# Patient Record
Sex: Female | Born: 1945 | Hispanic: No | Smoking: Never smoker
Health system: Southern US, Community
[De-identification: ages and names within clinical notes are randomized; demographics above are authoritative.]

## PROBLEM LIST (undated history)

## (undated) DIAGNOSIS — T4145XA Adverse effect of unspecified anesthetic, initial encounter: Secondary | ICD-10-CM

## (undated) DIAGNOSIS — J40 Bronchitis, not specified as acute or chronic: Secondary | ICD-10-CM

## (undated) DIAGNOSIS — G709 Myoneural disorder, unspecified: Secondary | ICD-10-CM

## (undated) DIAGNOSIS — T8859XA Other complications of anesthesia, initial encounter: Secondary | ICD-10-CM

## (undated) DIAGNOSIS — K219 Gastro-esophageal reflux disease without esophagitis: Secondary | ICD-10-CM

## (undated) DIAGNOSIS — M1990 Unspecified osteoarthritis, unspecified site: Secondary | ICD-10-CM

## (undated) DIAGNOSIS — J189 Pneumonia, unspecified organism: Secondary | ICD-10-CM

## (undated) DIAGNOSIS — I639 Cerebral infarction, unspecified: Secondary | ICD-10-CM

## (undated) DIAGNOSIS — I10 Essential (primary) hypertension: Secondary | ICD-10-CM

## (undated) DIAGNOSIS — R079 Chest pain, unspecified: Secondary | ICD-10-CM

## (undated) DIAGNOSIS — Z9889 Other specified postprocedural states: Secondary | ICD-10-CM

## (undated) DIAGNOSIS — E669 Obesity, unspecified: Secondary | ICD-10-CM

## (undated) DIAGNOSIS — I7121 Aneurysm of the ascending aorta, without rupture: Secondary | ICD-10-CM

## (undated) DIAGNOSIS — I712 Thoracic aortic aneurysm, without rupture: Secondary | ICD-10-CM

## (undated) HISTORY — PX: TONSILLECTOMY: SUR1361

## (undated) HISTORY — PX: MENISCUS REPAIR: SHX5179

## (undated) HISTORY — DX: Aneurysm of the ascending aorta, without rupture: I71.21

## (undated) HISTORY — PX: TEMPOROMANDIBULAR JOINT SURGERY: SHX35

## (undated) HISTORY — PX: ROTATOR CUFF REPAIR: SHX139

## (undated) HISTORY — PX: WISDOM TOOTH EXTRACTION: SHX21

## (undated) HISTORY — DX: Other specified postprocedural states: Z98.890

## (undated) HISTORY — DX: Obesity, unspecified: E66.9

## (undated) HISTORY — PX: CHOLECYSTECTOMY: SHX55

## (undated) HISTORY — DX: Thoracic aortic aneurysm, without rupture: I71.2

---

## 2002-11-20 HISTORY — PX: CARDIAC CATHETERIZATION: SHX172

## 2004-11-20 HISTORY — PX: ABDOMINAL HYSTERECTOMY: SHX81

## 2005-03-15 ENCOUNTER — Other Ambulatory Visit: Admission: RE | Admit: 2005-03-15 | Discharge: 2005-03-15 | Payer: Self-pay | Admitting: Obstetrics and Gynecology

## 2005-04-05 ENCOUNTER — Encounter: Admission: RE | Admit: 2005-04-05 | Discharge: 2005-04-05 | Payer: Self-pay | Admitting: Obstetrics and Gynecology

## 2005-08-25 ENCOUNTER — Emergency Department (HOSPITAL_COMMUNITY): Admission: EM | Admit: 2005-08-25 | Discharge: 2005-08-25 | Payer: Self-pay | Admitting: Emergency Medicine

## 2006-08-02 ENCOUNTER — Ambulatory Visit (HOSPITAL_COMMUNITY): Admission: RE | Admit: 2006-08-02 | Discharge: 2006-08-02 | Payer: Self-pay | Admitting: Geriatric Medicine

## 2007-07-24 ENCOUNTER — Emergency Department (HOSPITAL_COMMUNITY): Admission: EM | Admit: 2007-07-24 | Discharge: 2007-07-24 | Payer: Self-pay | Admitting: *Deleted

## 2007-08-20 ENCOUNTER — Other Ambulatory Visit: Admission: RE | Admit: 2007-08-20 | Discharge: 2007-08-20 | Payer: Self-pay | Admitting: Gynecology

## 2007-09-27 ENCOUNTER — Emergency Department (HOSPITAL_COMMUNITY): Admission: EM | Admit: 2007-09-27 | Discharge: 2007-09-28 | Payer: Self-pay | Admitting: Emergency Medicine

## 2007-09-30 ENCOUNTER — Emergency Department (HOSPITAL_COMMUNITY): Admission: EM | Admit: 2007-09-30 | Discharge: 2007-09-30 | Payer: Self-pay | Admitting: Emergency Medicine

## 2007-10-09 ENCOUNTER — Encounter: Admission: RE | Admit: 2007-10-09 | Discharge: 2007-10-09 | Payer: Self-pay | Admitting: Gastroenterology

## 2007-11-21 DIAGNOSIS — R079 Chest pain, unspecified: Secondary | ICD-10-CM

## 2007-11-21 HISTORY — DX: Chest pain, unspecified: R07.9

## 2008-01-06 ENCOUNTER — Ambulatory Visit: Payer: Self-pay | Admitting: Cardiovascular Disease

## 2008-01-06 ENCOUNTER — Encounter (INDEPENDENT_AMBULATORY_CARE_PROVIDER_SITE_OTHER): Payer: Self-pay | Admitting: Internal Medicine

## 2008-01-06 ENCOUNTER — Inpatient Hospital Stay (HOSPITAL_COMMUNITY): Admission: EM | Admit: 2008-01-06 | Discharge: 2008-01-08 | Payer: Self-pay | Admitting: Emergency Medicine

## 2008-01-06 ENCOUNTER — Ambulatory Visit: Payer: Self-pay | Admitting: Internal Medicine

## 2008-04-07 ENCOUNTER — Encounter: Admission: RE | Admit: 2008-04-07 | Discharge: 2008-04-07 | Payer: Self-pay | Admitting: Gastroenterology

## 2008-09-19 ENCOUNTER — Emergency Department (HOSPITAL_COMMUNITY): Admission: EM | Admit: 2008-09-19 | Discharge: 2008-09-19 | Payer: Self-pay | Admitting: Emergency Medicine

## 2008-10-09 ENCOUNTER — Emergency Department (HOSPITAL_COMMUNITY): Admission: EM | Admit: 2008-10-09 | Discharge: 2008-10-09 | Payer: Self-pay | Admitting: Emergency Medicine

## 2008-10-09 ENCOUNTER — Ambulatory Visit: Payer: Self-pay | Admitting: Surgery

## 2009-04-28 ENCOUNTER — Encounter: Admission: RE | Admit: 2009-04-28 | Discharge: 2009-04-28 | Payer: Self-pay | Admitting: Gastroenterology

## 2009-10-08 ENCOUNTER — Emergency Department (HOSPITAL_COMMUNITY): Admission: EM | Admit: 2009-10-08 | Discharge: 2009-10-09 | Payer: Self-pay | Admitting: Emergency Medicine

## 2010-08-05 ENCOUNTER — Emergency Department (HOSPITAL_COMMUNITY): Admission: EM | Admit: 2010-08-05 | Discharge: 2010-08-05 | Payer: Self-pay | Admitting: Family Medicine

## 2010-11-16 ENCOUNTER — Emergency Department (HOSPITAL_COMMUNITY)
Admission: EM | Admit: 2010-11-16 | Discharge: 2010-11-16 | Payer: Self-pay | Source: Home / Self Care | Admitting: Family Medicine

## 2010-12-12 ENCOUNTER — Encounter: Payer: Self-pay | Admitting: Gastroenterology

## 2010-12-15 ENCOUNTER — Encounter
Admission: RE | Admit: 2010-12-15 | Discharge: 2010-12-15 | Payer: Self-pay | Source: Home / Self Care | Attending: Geriatric Medicine | Admitting: Geriatric Medicine

## 2011-02-22 LAB — BASIC METABOLIC PANEL
CO2: 30 mEq/L (ref 19–32)
GFR calc Af Amer: 56 mL/min — ABNORMAL LOW (ref >60)
Potassium: 3.1 mEq/L — ABNORMAL LOW (ref 3.5–5.1)
Sodium: 138 mEq/L (ref 135–145)

## 2011-02-22 LAB — URINALYSIS, ROUTINE W REFLEX MICROSCOPIC
Bilirubin Urine: NEGATIVE
Glucose, UA: NEGATIVE mg/dL
Hgb urine dipstick: NEGATIVE
Specific Gravity, Urine: 1.012 (ref 1.005–1.030)
pH: 5 (ref 5.0–8.0)

## 2011-02-22 LAB — CBC
MCV: 93.7 fL (ref 78.0–100.0)
Platelets: 258 10*3/uL (ref 150–400)
WBC: 7.5 10*3/uL (ref 4.0–10.5)

## 2011-02-22 LAB — URINE CULTURE
Colony Count: NO GROWTH
Culture: NO GROWTH

## 2011-02-22 LAB — DIFFERENTIAL
Eosinophils Absolute: 0.2 10*3/uL (ref 0.0–0.7)
Lymphs Abs: 2.3 10*3/uL (ref 0.7–4.0)
Neutrophils Relative %: 61 % (ref 43–77)

## 2011-02-22 LAB — HEMOCCULT GUIAC POC 1CARD (OFFICE): Fecal Occult Bld: NEGATIVE

## 2011-02-22 LAB — ABO/RH: ABO/RH(D): B POS

## 2011-02-22 LAB — TYPE AND SCREEN: Antibody Screen: NEGATIVE

## 2011-04-04 NOTE — Consult Note (Signed)
NAME:  [name] [name], [name] [name]               ACCOUNT NO.:  [id number]   MEDICAL RECORD NO.:  [id number]          PATIENT TYPE:  INP   LOCATION:  1843                         FACILITY:  [facility]   PHYSICIAN:  Veverly Fells. Excell Seltzer, MD  DATE OF BIRTH:  Jul 17, 1946   DATE OF CONSULTATION:  01/06/2008  DATE OF DISCHARGE:                                 CONSULTATION   REASON FOR CONSULTATION:  Chest pain.   HISTORY OF PRESENT ILLNESS:  Ms. Vanburen is a 65 year old woman with no  prior cardiac history, who has presented with 4 days of left sided chest  pain that has been waxing and waning. She had sudden onset of pain when  playing with her child and it lasted for 15 minutes. Her pain has been  present almost at all times over the last 4 days but it has waxed and  waned in intensity. She describes the pain as sharp and it radiates to  the left arm. She has had mild shortness of breath and a feeling of  nausea when the pain occurred. She has had no light headedness or  syncope. She denies palpitations. She has had similar episodes in the  past with intermittent chest pain and also had a cardiac catheterization  in 2004 that was normal by her report. She has no other complaints at  present.   PAST MEDICAL HISTORY:  1. Essential hypertension.  2. Type 2 diabetes.  3. Gastroesophageal reflux disease.  4. Chronic back pain.   MEDICATIONS:  1. Aspirin 81 mg daily.  2. Atenolol 25 mg daily.  3. Hydrochlorothiazide 25 mg daily.  4. Metformin 250 mg b.i.d.  5. Actos 30 mg daily.  6. Flexeril 10 mg at bedtime.  7. Nitroglycerin p.r.n.  8. Protonix 40 mg daily.   ALLERGIES:  CODEINE.   SOCIAL HISTORY:  The patient is married. She lives locally with her  husband. She does participate in regular exercise. She does not smoke  cigarettes or drink alcohol. She uses no illicit drugs.   FAMILY HISTORY:  Her mother died at age 92 of pancreatitis. Her father  died of mitral valve disease at age 30. She has a  brother who has had  myocardial infarction and also has had bypass surgery and a sister who  has a pacemaker.   REVIEW OF SYSTEMS:  Complete 12 point review of systems was performed.  Pertinent positives included exertional dyspnea, paroxysmal nocturnal  dyspnea, nausea, gastroesophageal reflux, and abdominal pain. All other  systems reviewed and are negative except as detailed above.   PHYSICAL EXAMINATION:  GENERAL: Alert and oriented. No acute distress.  VITAL SIGNS:  Temperature 97, heart rate 58, respiratory rate 18, blood  pressure 140/85. O2 saturation 97% on room air.  HEENT:  Normal.  NECK:  Normal carotid upstrokes without bruits. Jugular venous pressure  is normal. No thyromegaly or thyroid nodules.  LUNGS:  Clear to auscultation bilaterally.  HEART:  The apex is discrete and non-displaced.  HEART:  Regular rate and rhythm. Without murmur, rub, or gallop.  ABDOMEN:  Soft, nontender. No organomegaly and no bruits.  BACK:  No CVA tenderness.  EXTREMITIES:  No clubbing, cyanosis, or edema. Peripheral pulses 2+ and  equal throughout.  SKIN:  Warm and dry without rash.  MUSCULOSKELETAL:  No joint deformities.  NEUROLOGIC:  Cranial nerves 2-12 are intact. Strength 5/5 and equal in  the arms and legs bilaterally.  LYMPH:  No adenopathy.   LABORATORY DATA:  Chest x-ray shows cardiac enlargement, otherwise  normal.   EKG shows normal sinus rhythm with non-specific T wave abnormality,  localized to the anteroseptal leads, left axis deviation, otherwise  within normal limits.   Troponin is less than 0.05. CK MB is normal. Creatinine 0.8, potassium  3.3. Hemoglobin 13.4. Platelet count 305,000. White blood cell count  4.9.   ASSESSMENT/PLAN:  1. Atypical chest pain. Initial EKG is non-diagnostic and cardiac      enzymes are normal. I think it is unlikely that this is cardiac      pain, based on the patient's history, previous normal coronaries by      report, and the absence  of objective findings of ischemia. Would      recommend continuing with rule out MI protocol with serial EKG's      and cardiac enzymes. She has already undergone a 2-D echo and      results of that are pending. If her echo shows no wall motion      abnormaliites and her cardiac enzymes are normal, I think it would      be reasonable to proceed with an outpatient Myoview study. If she      has persistent or progressive symptoms, we may need to proceed with      further inpatient evaluation but I think this is most likely a non-      cardiac episode of chest pain. It would be worthwhile to obtain the      report from her previous catheterization, which was performed in      [place], [place].   For remaining medical problems including hypertension and diabetes, her  medical therapy will be continued.      Veverly Fells. Excell Seltzer, MD  Electronically Signed     MDC/MEDQ  D:  01/06/2008  T:  01/06/2008  Job:  [id number]

## 2011-04-04 NOTE — Discharge Summary (Signed)
NAME:  [name] [name], [name] [name]               ACCOUNT NO.:  [id number]   MEDICAL RECORD NO.:  [id number]          PATIENT TYPE:  INP   LOCATION:  3703                         FACILITY:  [facility]   PHYSICIAN:  [name] [name], M.D.DATE OF BIRTH:  Apr 05, 1946   DATE OF ADMISSION:  01/06/2008  DATE OF DISCHARGE:  01/08/2008                               DISCHARGE SUMMARY   PRIMARY CARE PHYSICIAN:  [name] [name], M.D., [name] [name], [place]   CONSULTATIONS:  Cardiology.   PROCEDURE:  Echocardiogram and stress Myoview.   REASON FOR ADMISSION:  A 65 year old female with a history of diabetes,  gastroesophageal reflux disease, hypertension, and strong family history  for coronary artery disease, who presents with a three-day history of  chest pain.   PERTINENT ADMISSION LABS:  CBC which showed white blood cell count 4.9,  hemoglobin 13.4, hematocrit 39.5, platelets 305.  Electrolytes were  within normal limits with a sodium of 141, potassium 3.3, chloride 104,  bicarb 29, BUN 15, creatinine 0.77, and glucose 132.  Point of care  cardiac enzymes were negative.   Admission studies included an EKG which showed inverted T waves in V1  through V3, flattened T waves in other leads with poor R wave  progression particularly anterior and septal.   DISCHARGE DIAGNOSES:  1. Angina.  2. Type 2 diabetes.  3. Hypertension.  4. Gastroesophageal reflux disease.   DISCHARGE MEDICATIONS:  1. IMDUR 30 mg p.o. daily.  2. Hydrochlorothiazide 25 mg p.o. daily.  3. Metformin 250 mg p.o. b.i.d.  4. Actos 30 mg p.o. daily.  5. Flexeril 10 mg p.o. q.h.s.  6. Aspirin 81 mg p.o. daily.  7. Lisinopril 5 mg p.o. daily.   MEDICATIONS STOPPED DURING ADMISSION:  Atenolol.   HOSPITAL COURSE:  Problem 1.  Chest pain.  Cardiology was consulted and  based on the patient's symptoms and EKG changes, they recommended an  echocardiogram which showed overall left ventricular systolic function  normal with an ejection fraction of 60%,  no regional wall motion  abnormalities, mild mitral valve replacement, a right ventricular size  that was the upper limits of normal, and mildly reduced right  ventricular function.  Following that a stress Myoview was completed  which was within normal limits.  Additionally, the patient was risk  stratified with a fasting lipid panel which showed a total cholesterol  143, triglycerides 78, HDL 39, LDL 88, VLDL 16, total cholesterol to HDL  ratio 3.7.  TSH was also measured which was within normal limits at  0.650.  It was decided by cardiology that catheterization was not  necessary.  However, if the patient has future chest pain, we would  definitely recommend reconsidering referring her for catheterization as  she has a multitude of risk factors particularly with a strong family  history with relatives dying in their 18s from myocardial infarction.  Additional risk factors include her diabetes, her hypertension, and her  age.  The patient was started on IMDUR 30 mg daily for her chest pain.  Also to further reduce risk factors, at some point in the future it  might be appropriate  to start her on a statin or Fish Oil.  We will  leave this up to the primary care [name] [name].   Problem 2.  Hypertension.  The patient's blood pressure was well  controlled in the hospital.  We did change her hypertensive medications.  We discontinued her atenolol and started her on a low dose of lisinopril  5 mg daily.  We thought this would be beneficial since she has diabetes  and this would be good renal protection.   Problem 3.  Diabetes.  During her hospitalization, we put her on a  sliding scale insulin and held her Metformin because we did not know  whether or not she would get a catheterization.  We did discharge her on  Metformin.  We continued her Actos.  We checked the hemoglobin A1C which  was found to be 6.3.  Her blood sugars were between 99 and 173 during  this hospitalization.   Problem 4.   Gastroesophageal reflux disease.  The patient was continued  on her Prilosec.   CONDITION ON DISCHARGE:  Stable.   PENDING TESTS AT DISCHARGE:  None.   DISPOSITION:  The patient was discharged home.   FOLLOWUP:  The patient is to see her primary care [name] [name], Dr. Florentina [name], and an appointment is to be set up by his office staff on  January 13, 2008.  She has been dismissed from cardiology and needs no  further appointments per their notes.   FOLLOW-UP ISSUES:  Titration of blood pressure medicines as appropriate  since we changed her from atenolol to lisinopril, this may need to be  titrated.  The patient was instructed to check her blood pressure daily  in the morning and to hold her lisinopril if her systolic blood pressure  was 110 or less.   The patient will probably need a basic metabolic panel some time in the  near future to monitor both her sodium and potassium on her ACE  inhibitor.   We discharged her on aspirin 81 mg, however, given her risk factors, she  may be an appropriate candidate for 325 mg.  We recommend strongly  considering bumping up her aspirin dose to 325 mg.   The patient may benefit from a statin or Fish Oil to further decrease  her risk for heart disease.      [name] Muir, MD  Electronically Signed      [name] [name]. Mayford [name], M.D.  Electronically Signed    SO/MEDQ  D:  01/08/2008  T:  01/09/2008  Job:  [id number]   cc:   [name] [name], M.D., [place], [place]

## 2011-04-04 NOTE — H&P (Signed)
NAME:  [name] [name], [name] [name]               ACCOUNT NO.:  [id number]   MEDICAL RECORD NO.:  [id number]          PATIENT TYPE:  INP   LOCATION:  3703                         FACILITY:  [facility]   PHYSICIAN:  Raynelle Fanning A. Mayford Knife, M.D.DATE OF BIRTH:  09-14-1946   DATE OF ADMISSION:  01/06/2008  DATE OF DISCHARGE:                              HISTORY & PHYSICAL   CHIEF COMPLAINT:  Chest pain.   HISTORY OF PRESENT ILLNESS:  This is a 65 year old female with a history  of diabetes, GERD, and hypertension who presents to ED for a three-day  history of atypical chest pain.  She reports that she was laying on the  couch resting when the chest pressure started mid substernally and  radiated down her left arm.  She did report associated nausea and  diaphoresis, but no vomiting or shortness of breath.  The pressure/pain  resolved over 15 minutes.  Pain is worsened with exertion.  Pain  returned every 20 minutes to hour intermittently since Friday.  This  morning she decided to come to the hospital as the pain was persistent.  She has not received nitroglycerin.  Prevacid did not alleviate the  pain.  She has had chest pain in the past and had a catheterization in  [place] in [place] which was negative.  She does have a strong family history of  CAD and MI.   PAST MEDICAL HISTORY:  Diabetes type 2, GERD, hypertension, CAD.   MEDICATIONS:  1. Metformin 250 mg p.o. b.i.d.  2. Aspirin 81 mg daily.  3. Atenolol 25 mg daily.  4. Actos 3 mg daily.  5. Flexeril 10 mg daily.  6. Prevacid 30 mg daily.  7. Hydrochlorothiazide 25 mg daily.   FAMILY HISTORY:  Mother died at 16 of pancreatitis.  Father died at 40  of MI status post CABG.  Brother is living with MI in his 25's status  post CABG.   ALLERGIES:  CODEINE causes nausea.   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  She lives with her husband 60 year old son.  She has four  grandchildren.  She denies alcohol, tobacco, or  illicit drug use.   LABORATORY DATA:  Creatinine 0.77, hemoglobin 13.4.  Point of care  enzymes negative.  EKG shows flipped T waves in V1 through V3, T waves,  poor R wave progression.   REVIEW OF SYSTEMS:  See HPI.  GENERAL:  No fevers.  RESPIRATORY:  No  shortness of breath.  ABDOMEN:  No abdominal pain.   PHYSICAL EXAMINATION:  VITAL SIGNS:  Temperature 97, blood pressure  157/100, pulse 72, respiratory rate 20, and oxygen saturation 100% on  room air.  GENERAL:  She is alert, pleasant, and in no acute distress.  LUNGS:  Clear to auscultation bilaterally, no increased work of  breathing. Tender to palpation of left epigastrium.  CARDIOVASCULAR:  Regular rate and rhythm with no murmurs, rubs, or  gallops.  ABDOMEN:  Soft and nontender with positive bowel sounds.  EXTREMITIES:  No edema.   ASSESSMENT:  This is a 65 year old female  with:  1. Chest pain, typical angina versus costochondritis versus      gastroesophageal reflux disease.  EKG shows nonspecific T wave      changes.  She has a very strong family history of coronary artery      disease/myocardial infarction.  Her other risk factors include her      female sex, diabetes, and hypertension.  We will admit to rule out      myocardial infarction.  We will also risk stratify with fasting      lipid panel, hemoglobin A1C.  We will check TSH and get two-      dimensional echocardiogram to assess level of prior ischemia.  We      will call cardiology for further evaluation is she is at high risk      for CAD.  2. Hypertension.  Continue home medicines.  We may need to change home      regimen.  3. Diabetes.  Hold Metformin because we may go to catheterization.      Continue Actos and start sliding scale insulin.  4. Gastroesophageal reflux disease.  Continue Prilosec.  5. FEN, GI, NPO for now.  6. Prophylaxis.  Lovenox.   DISPOSITION:  Pending #1, we will likely need complete cardiac workup as  an outpatient.       Ruthe Mannan, M.D.  Electronically Signed      Zenaida Deed. Mayford Knife, M.D.  Electronically Signed    TA/MEDQ  D:  01/06/2008  T:  01/07/2008  Job:  [id number]

## 2011-07-07 ENCOUNTER — Emergency Department (HOSPITAL_COMMUNITY)
Admission: EM | Admit: 2011-07-07 | Discharge: 2011-07-08 | Disposition: A | Discharge status: Home / Self Care | Payer: Medicare Other | Attending: Emergency Medicine | Admitting: Emergency Medicine

## 2011-07-07 DIAGNOSIS — E119 Type 2 diabetes mellitus without complications: Secondary | ICD-10-CM | POA: Insufficient documentation

## 2011-07-07 DIAGNOSIS — G319 Degenerative disease of nervous system, unspecified: Secondary | ICD-10-CM | POA: Insufficient documentation

## 2011-07-07 DIAGNOSIS — R51 Headache: Secondary | ICD-10-CM | POA: Insufficient documentation

## 2011-07-07 DIAGNOSIS — K219 Gastro-esophageal reflux disease without esophagitis: Secondary | ICD-10-CM | POA: Insufficient documentation

## 2011-07-07 DIAGNOSIS — H538 Other visual disturbances: Secondary | ICD-10-CM | POA: Insufficient documentation

## 2011-07-07 DIAGNOSIS — I10 Essential (primary) hypertension: Secondary | ICD-10-CM | POA: Insufficient documentation

## 2011-07-07 DIAGNOSIS — R42 Dizziness and giddiness: Secondary | ICD-10-CM | POA: Insufficient documentation

## 2011-07-07 DIAGNOSIS — R209 Unspecified disturbances of skin sensation: Secondary | ICD-10-CM | POA: Insufficient documentation

## 2011-07-08 ENCOUNTER — Emergency Department (HOSPITAL_COMMUNITY): Payer: Medicare Other

## 2011-07-08 LAB — BASIC METABOLIC PANEL
Calcium: 9.8 mg/dL (ref 8.4–10.5)
Creatinine, Ser: 0.88 mg/dL (ref 0.50–1.10)
GFR calc Af Amer: >60 mL/min (ref >60)

## 2011-07-08 LAB — URINALYSIS, ROUTINE W REFLEX MICROSCOPIC
Glucose, UA: NEGATIVE mg/dL
Specific Gravity, Urine: 1.012 (ref 1.005–1.030)
pH: 5 (ref 5.0–8.0)

## 2011-07-08 LAB — CBC
Hemoglobin: 13.4 g/dL (ref 12.0–15.0)
MCH: 30.3 pg (ref 26.0–34.0)
RBC: 4.42 MIL/uL (ref 3.87–5.11)

## 2011-07-08 LAB — SEDIMENTATION RATE: Sed Rate: 17 mm/hr (ref 0–22)

## 2011-07-08 LAB — DIFFERENTIAL
Basophils Absolute: 0 10*3/uL (ref 0.0–0.1)
Basophils Relative: 1 % (ref 0–1)
Monocytes Relative: 6 % (ref 3–12)
Neutro Abs: 4.6 10*3/uL (ref 1.7–7.7)
Neutrophils Relative %: 55 % (ref 43–77)

## 2011-07-08 LAB — URINE MICROSCOPIC-ADD ON

## 2011-07-09 ENCOUNTER — Inpatient Hospital Stay (INDEPENDENT_AMBULATORY_CARE_PROVIDER_SITE_OTHER)
Admission: RE | Admit: 2011-07-09 | Discharge: 2011-07-09 | Disposition: A | Discharge status: Home / Self Care | Payer: Medicare Other | Source: Ambulatory Visit | Attending: Emergency Medicine | Admitting: Emergency Medicine

## 2011-07-09 DIAGNOSIS — J019 Acute sinusitis, unspecified: Secondary | ICD-10-CM

## 2011-07-09 DIAGNOSIS — G43009 Migraine without aura, not intractable, without status migrainosus: Secondary | ICD-10-CM

## 2011-07-11 ENCOUNTER — Emergency Department (HOSPITAL_COMMUNITY)
Admission: EM | Admit: 2011-07-11 | Discharge: 2011-07-11 | Disposition: A | Discharge status: Home / Self Care | Payer: Medicare Other | Attending: Emergency Medicine | Admitting: Emergency Medicine

## 2011-07-11 ENCOUNTER — Emergency Department (HOSPITAL_COMMUNITY): Payer: Medicare Other

## 2011-07-11 DIAGNOSIS — K219 Gastro-esophageal reflux disease without esophagitis: Secondary | ICD-10-CM | POA: Insufficient documentation

## 2011-07-11 DIAGNOSIS — E119 Type 2 diabetes mellitus without complications: Secondary | ICD-10-CM | POA: Insufficient documentation

## 2011-07-11 DIAGNOSIS — R110 Nausea: Secondary | ICD-10-CM | POA: Insufficient documentation

## 2011-07-11 DIAGNOSIS — H53149 Visual discomfort, unspecified: Secondary | ICD-10-CM | POA: Insufficient documentation

## 2011-07-11 DIAGNOSIS — G43909 Migraine, unspecified, not intractable, without status migrainosus: Secondary | ICD-10-CM | POA: Insufficient documentation

## 2011-07-11 DIAGNOSIS — J329 Chronic sinusitis, unspecified: Secondary | ICD-10-CM | POA: Insufficient documentation

## 2011-07-11 DIAGNOSIS — R509 Fever, unspecified: Secondary | ICD-10-CM | POA: Insufficient documentation

## 2011-07-11 DIAGNOSIS — I10 Essential (primary) hypertension: Secondary | ICD-10-CM | POA: Insufficient documentation

## 2011-07-11 LAB — POCT I-STAT, CHEM 8
BUN: 13 mg/dL (ref 6–23)
Calcium, Ion: 1.14 mmol/L (ref 1.12–1.32)
Glucose, Bld: 146 mg/dL — ABNORMAL HIGH (ref 70–99)
HCT: 44 % (ref 36.0–46.0)
TCO2: 27 mmol/L (ref 0–100)

## 2011-08-11 LAB — CBC
HCT: 37.3
Hemoglobin: 12.6
MCHC: 33.8
MCV: 94
Platelets: 272
Platelets: 305
RBC: 3.97
RBC: 4.25
WBC: 6.2

## 2011-08-11 LAB — CARDIAC PANEL(CRET KIN+CKTOT+MB+TROPI)
Relative Index: INVALID
Relative Index: INVALID
Total CK: 49
Troponin I: 0.01
Troponin I: <0.01

## 2011-08-11 LAB — COMPREHENSIVE METABOLIC PANEL
ALT: 11
AST: 14
CO2: 26
Calcium: 9
Chloride: 107
Creatinine, Ser: 0.84
GFR calc Af Amer: >60
GFR calc non Af Amer: >60
Glucose, Bld: 111 — ABNORMAL HIGH
Total Bilirubin: 0.6

## 2011-08-11 LAB — DIFFERENTIAL
Basophils Absolute: 0
Basophils Relative: 1
Eosinophils Absolute: 0.1
Eosinophils Relative: 3
Lymphocytes Relative: 45
Lymphs Abs: 2.8
Monocytes Absolute: 0.5
Monocytes Relative: 6
Monocytes Relative: 8
Neutro Abs: 2.3
Neutrophils Relative %: 48

## 2011-08-11 LAB — BASIC METABOLIC PANEL
BUN: 15
CO2: 29
Calcium: 9.3
Chloride: 104
Creatinine, Ser: 0.77
GFR calc Af Amer: >60
GFR calc Af Amer: >60
GFR calc non Af Amer: >60
Potassium: 3.3 — ABNORMAL LOW
Sodium: 138

## 2011-08-11 LAB — HEPATIC FUNCTION PANEL
ALT: 13
AST: 14
Albumin: 3.3 — ABNORMAL LOW
Alkaline Phosphatase: 87
Bilirubin, Direct: <0.1
Total Bilirubin: 0.6

## 2011-08-11 LAB — POCT CARDIAC MARKERS
CKMB, poc: <1 — ABNORMAL LOW
Myoglobin, poc: 43.2
Operator id: 198171

## 2011-08-11 LAB — CK TOTAL AND CKMB (NOT AT ARMC): Total CK: 54

## 2011-08-11 LAB — LIPID PANEL
Total CHOL/HDL Ratio: 3.7
VLDL: 16

## 2011-08-21 LAB — POCT I-STAT, CHEM 8
HCT: 42
Hemoglobin: 14.3
Potassium: 3.2 — ABNORMAL LOW
Sodium: 140

## 2011-08-21 LAB — DIFFERENTIAL
Basophils Relative: 1
Eosinophils Absolute: 0.1
Lymphs Abs: 1.9
Neutrophils Relative %: 50

## 2011-08-21 LAB — URINALYSIS, ROUTINE W REFLEX MICROSCOPIC
Glucose, UA: NEGATIVE
Hgb urine dipstick: NEGATIVE
Ketones, ur: NEGATIVE
pH: 5

## 2011-08-21 LAB — CBC
MCHC: 32.6
MCV: 93.4
Platelets: 274
WBC: 4.8

## 2011-08-21 LAB — GLUCOSE, CAPILLARY: Glucose-Capillary: 124 — ABNORMAL HIGH

## 2011-08-21 LAB — URINE MICROSCOPIC-ADD ON

## 2011-08-22 LAB — BASIC METABOLIC PANEL
GFR calc non Af Amer: >60
Potassium: 3.2 — ABNORMAL LOW
Sodium: 138

## 2011-08-22 LAB — DIFFERENTIAL
Eosinophils Relative: 2
Lymphocytes Relative: 40
Lymphs Abs: 2.1
Monocytes Absolute: 0.4

## 2011-08-22 LAB — CBC
HCT: 41
Hemoglobin: 13.4
Platelets: 258
RBC: 4.33
WBC: 5.2

## 2011-08-22 LAB — D-DIMER, QUANTITATIVE: D-Dimer, Quant: 0.66 — ABNORMAL HIGH

## 2011-08-29 LAB — I-STAT 8, (EC8 V) (CONVERTED LAB)
Acid-Base Excess: 1
Chloride: 106
Hemoglobin: 15
Potassium: 3.6
Sodium: 142
TCO2: 28
pH, Ven: 7.381 — ABNORMAL HIGH

## 2011-08-29 LAB — URINE MICROSCOPIC-ADD ON

## 2011-08-29 LAB — COMPREHENSIVE METABOLIC PANEL
Albumin: 3.3 — ABNORMAL LOW
BUN: 9
CO2: 26
Chloride: 106
Creatinine, Ser: 0.69
GFR calc non Af Amer: >60
Glucose, Bld: 124 — ABNORMAL HIGH
Total Bilirubin: 0.6

## 2011-08-29 LAB — URINALYSIS, ROUTINE W REFLEX MICROSCOPIC
Glucose, UA: NEGATIVE
Nitrite: NEGATIVE
Protein, ur: NEGATIVE
Protein, ur: NEGATIVE
Urobilinogen, UA: 1

## 2011-08-29 LAB — DIFFERENTIAL
Basophils Absolute: 0
Eosinophils Absolute: 0.2
Eosinophils Relative: 3
Lymphs Abs: 1.8
Monocytes Absolute: 0.3

## 2011-08-29 LAB — LIPASE, BLOOD: Lipase: 21

## 2011-08-29 LAB — URINE CULTURE

## 2011-08-29 LAB — CBC
HCT: 39.3
Hemoglobin: 13.1
MCV: 90.8
Platelets: 313
RDW: 15.1

## 2011-08-29 LAB — POCT I-STAT CREATININE: Operator id: 198171

## 2011-09-01 LAB — I-STAT 8, (EC8 V) (CONVERTED LAB)
Acid-Base Excess: 5 — ABNORMAL HIGH
Bicarbonate: 31.4 — ABNORMAL HIGH
HCT: 46
Hemoglobin: 15.6 — ABNORMAL HIGH
Operator id: 277751
Sodium: 140
TCO2: 33
pCO2, Ven: 49.8

## 2011-09-01 LAB — URINALYSIS, ROUTINE W REFLEX MICROSCOPIC
Glucose, UA: NEGATIVE
Hgb urine dipstick: NEGATIVE
Protein, ur: NEGATIVE
Specific Gravity, Urine: 1.03
Urobilinogen, UA: 1

## 2011-09-01 LAB — CBC
HCT: 40.6
MCV: 91.7
Platelets: 329
RDW: 15.5 — ABNORMAL HIGH
WBC: 6.4

## 2011-09-01 LAB — DIFFERENTIAL
Basophils Absolute: 0
Basophils Relative: 0
Eosinophils Absolute: 0.1
Eosinophils Relative: 2
Neutrophils Relative %: 58

## 2011-09-01 LAB — URINE MICROSCOPIC-ADD ON

## 2011-09-01 LAB — POCT I-STAT CREATININE: Creatinine, Ser: 0.9

## 2011-12-11 ENCOUNTER — Other Ambulatory Visit: Payer: Self-pay | Admitting: Geriatric Medicine

## 2011-12-11 DIAGNOSIS — Z1231 Encounter for screening mammogram for malignant neoplasm of breast: Secondary | ICD-10-CM

## 2012-01-02 ENCOUNTER — Ambulatory Visit
Admission: RE | Admit: 2012-01-02 | Discharge: 2012-01-02 | Disposition: A | Discharge status: Home / Self Care | Payer: Medicare Other | Source: Ambulatory Visit | Attending: Geriatric Medicine | Admitting: Geriatric Medicine

## 2012-01-02 DIAGNOSIS — Z1231 Encounter for screening mammogram for malignant neoplasm of breast: Secondary | ICD-10-CM

## 2012-01-09 ENCOUNTER — Other Ambulatory Visit: Payer: Self-pay | Admitting: Geriatric Medicine

## 2012-01-09 DIAGNOSIS — R928 Other abnormal and inconclusive findings on diagnostic imaging of breast: Secondary | ICD-10-CM

## 2012-01-19 ENCOUNTER — Other Ambulatory Visit: Payer: Medicare Other

## 2012-02-12 ENCOUNTER — Ambulatory Visit
Admission: RE | Admit: 2012-02-12 | Discharge: 2012-02-12 | Disposition: A | Discharge status: Home / Self Care | Payer: Medicare Other | Source: Ambulatory Visit | Attending: Geriatric Medicine | Admitting: Geriatric Medicine

## 2012-02-12 DIAGNOSIS — R928 Other abnormal and inconclusive findings on diagnostic imaging of breast: Secondary | ICD-10-CM

## 2012-03-02 ENCOUNTER — Emergency Department (HOSPITAL_COMMUNITY)
Admission: EM | Admit: 2012-03-02 | Discharge: 2012-03-02 | Disposition: A | Discharge status: Home / Self Care | Payer: Medicare Other | Source: Home / Self Care | Attending: Emergency Medicine | Admitting: Emergency Medicine

## 2012-03-02 ENCOUNTER — Encounter (HOSPITAL_COMMUNITY): Payer: Self-pay | Admitting: Emergency Medicine

## 2012-03-02 DIAGNOSIS — M65839 Other synovitis and tenosynovitis, unspecified forearm: Secondary | ICD-10-CM

## 2012-03-02 DIAGNOSIS — M659 Synovitis and tenosynovitis, unspecified: Secondary | ICD-10-CM

## 2012-03-02 HISTORY — DX: Essential (primary) hypertension: I10

## 2012-03-02 MED ORDER — MELOXICAM 7.5 MG PO TABS: 7.5000 mg | ORAL_TABLET | Freq: Every day | ORAL | Status: DC

## 2012-03-02 MED ORDER — TRAMADOL HCL 50 MG PO TABS: 100.0000 mg | ORAL_TABLET | Freq: Three times a day (TID) | ORAL | Status: AC | PRN

## 2012-03-02 NOTE — Discharge Instructions (Signed)
Tendinitis and Tenosynovitis  Tendinitis is inflammation of the tendon. Tenosynovitis is inflammation of the lining around the tendon (tendon sheath). These painful conditions often occur at once. Tendons attach muscle to bone. To move a limb, force from the muscle moves through the tendon, to the bone. These conditions often cause increased pain when moving. Tendinitis may be caused by a small or partial tear in the tendon.  SYMPTOMS   Pain, tenderness, redness, bruising, or swelling at the injury.   Loss of normal joint movement.   Pain that gets worse with use of the muscle and joint attached to the tendon.   Weakness in the tendon, caused by calcium build up that may occur with tendinitis.   Commonly affected tendons:   Achilles tendon (calf of leg).   Rotator cuff (shoulder joint).   Patellar tendon (kneecap to shin).   Peroneal tendon (ankle).   Posterior tibial tendon (inner ankle).   Biceps tendon (in front of shoulder).  CAUSES   Sudden strain on a flexed muscle, muscle overuse, sudden increase or change in activity, vigorous activity.   Result of a direct hit (less common).   Poor muscle action (biomechanics).  RISK INCREASES WITH:  Injury (trauma).   Too much exercise.   Sudden change in athletic activity.   Incorrect exercise form or technique.   Poor strength and flexibility.   Not warming-up properly before activity.   Returning to activity before healing is complete.  PREVENTION   Warm-up and stretch properly before activity.   Maintain physical fitness:   Joint flexibility.   Muscle strength and endurance.   Fitness that increases heart rate.   Learn and use proper exercise techniques.   Use rehabilitation exercises to strengthen weak muscles and tendons.   Ice the tendon after activity, to reduce recurring inflammation.   Wear proper fitting protective equipment for specific tendons, when indicated.  PROGNOSIS  When treated properly,  can be cured in 6 to 8 weeks. Recovery may take longer, depending on degree of injury.  RELATED COMPLICATIONS   Re-injury or recurring symptoms.   Permanent weakness or joint stiffness, if injury is severe and recovery is not completed.   Delayed healing, if sports are started before healing is complete.   Tearing apart (rupture) of the inflamed tendon. Tendinitis means the tendon is injured and must recover.  TREATMENT  Treatment first involves ice, medicine, and rest from aggravating activities. This reduces pain and inflammation. Modifying your activity may be considered to prevent recurring injury. A brace, elastic bandage wrap, splint, cast, or sling may be prescribed to protect the joint for a short period. After that period, strengthening and stretching exercise may help to regain strength and full range of motion. If the condition persists, despite non-surgical treatment, surgery may be recommended to remove the inflamed tendon lining. Corticosteroid injections may be given to reduce inflammation. However, these injections may weaken the tendon and increase your risk for tendon rupture. MEDICATION   If pain medicine is needed, nonsteroidal anti-inflammatory medicines (aspirin and ibuprofen), or other minor pain relievers (acetaminophen), are often recommended.   Do not take pain medicine for 7 days before surgery.   Prescription pain relievers are usually prescribed only after surgery. Use only as directed and only as much as you need.   Ointments applied to the skin may be helpful.   Corticosteroid injections may be given to reduce inflammation. However, this may increase your risk of a tendon rupture.  HEAT AND COLD  Cold   treatment (icing) relieves pain and reduces inflammation. Cold treatment should be applied for 10 to 15 minutes every 2 to 3 hours, and immediately after activity that aggravates your symptoms. Use ice packs or an ice massage.   Heat treatment may be used before  performing stretching and strengthening activities prescribed by your caregiver, physical therapist, or athletic trainer. Use a heat pack or a warm water soak.  SEEK MEDICAL CARE IF:   Symptoms get worse or do not improve, despite treatment.   Pain becomes too much to tolerate.   You develop numbness or tingling.   Toes become cold, or toenails become blue, gray, or dark colored.   New, unexplained symptoms develop. (Drugs used in treatment may produce side effects.)  Document Released: 11/06/2005 Document Revised: 10/26/2011 Document Reviewed: 02/18/2009 [employer] Patient Information 2012 [employer]. 

## 2012-03-02 NOTE — ED Provider Notes (Addendum)
Chief Complaint  Patient presents with  . Wrist Pain    History of Present Illness:   The patient is a 66 year old female who has pain over the radial aspect of the right wrist for the past 3 days. 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 The wrist hurts worse if she flexes it and particularly if she on her flexes it. There is no swelling, no numbness, tingling, or weakness.  Review of Systems:  Other than noted above, the patient denies any of the following symptoms: Systemic:  No fevers, chills, sweats, or aches.  No fatigue or tiredness. Musculoskeletal:  No joint pain, arthritis, bursitis, swelling, back pain, or neck pain. Neurological:  No muscular weakness, paresthesias, headache, or trouble with speech or coordination.  No dizziness.   PMFSH:  Past medical history, family history, social history, meds, and allergies were reviewed.  Physical Exam:   Vital signs:  BP 150/87  Pulse 84  Temp(Src) 98.7 F (37.1 C) (Oral)  Resp 20  SpO2 100% Gen:  Alert and oriented times 3.  In no distress. Musculoskeletal: There was pain to palpation over the extensor tendon as it progresses over the radial styloid. There was no swelling. Finkelstein's test was positive. The wrist had full range of motion and there was no pain in the anatomical snuff box area. Otherwise, all joints had a full a ROM with no swelling, bruising or deformity.  No edema, pulses full. Extremities were warm and pink.  Capillary refill was brisk.  Skin:  Clear, warm and dry.  No rash. Neuro:  Alert and oriented times 3.  Muscle strength was normal.  Sensation was intact to light touch.   Course in Urgent Care Center:   She was placed in a wrist splint with thumb spica.  Assessment:  The encounter diagnosis was Tenosynovitis, wrist. She has DeQuervain's synovitis. I offered an injection, but she opted for  the wrist splint with thumb spica instead.  Plan:   1.  The following meds were prescribed:   New Prescriptions   MELOXICAM (MOBIC) 7.5 MG TABLET    Take 1 tablet (7.5 mg total) by mouth daily.   TRAMADOL (ULTRAM) 50 MG TABLET    Take 2 tablets (100 mg total) by mouth every 8 (eight) hours as needed for pain.   2.  The patient was instructed in symptomatic care, including rest and activity, elevation, application of ice and compression.  Appropriate handouts were given. 3.  The patient was told to return if becoming worse in any way, if no better in 3 or 4 days, and given some red flag symptoms that would indicate earlier return.   4.  The patient was told to follow up  Here in 2 weeks if no improvement.   Reuben Likes, MD 03/02/12 1958  Reuben Likes, MD 03/02/12 [phone]

## 2012-03-02 NOTE — ED Notes (Signed)
C/o right wrist pain, no specific injury.  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  Pain in radial aspect of wrist

## 2012-03-19 ENCOUNTER — Encounter (HOSPITAL_COMMUNITY): Payer: Self-pay | Admitting: *Deleted

## 2012-03-19 ENCOUNTER — Inpatient Hospital Stay (HOSPITAL_COMMUNITY)
Admission: AD | Admit: 2012-03-19 | Discharge: 2012-03-19 | Disposition: A | Discharge status: Home / Self Care | Payer: Medicare Other | Source: Ambulatory Visit | Attending: Obstetrics & Gynecology | Admitting: Obstetrics & Gynecology

## 2012-03-19 DIAGNOSIS — R109 Unspecified abdominal pain: Secondary | ICD-10-CM | POA: Insufficient documentation

## 2012-03-19 DIAGNOSIS — N8110 Cystocele, unspecified: Secondary | ICD-10-CM

## 2012-03-19 DIAGNOSIS — N816 Rectocele: Secondary | ICD-10-CM

## 2012-03-19 DIAGNOSIS — Z90710 Acquired absence of both cervix and uterus: Secondary | ICD-10-CM | POA: Insufficient documentation

## 2012-03-19 DIAGNOSIS — N8189 Other female genital prolapse: Secondary | ICD-10-CM | POA: Insufficient documentation

## 2012-03-19 LAB — URINALYSIS, ROUTINE W REFLEX MICROSCOPIC
Hgb urine dipstick: NEGATIVE
Specific Gravity, Urine: 1.02 (ref 1.005–1.030)
Urobilinogen, UA: 0.2 mg/dL (ref 0.0–1.0)
pH: 6 (ref 5.0–8.0)

## 2012-03-19 NOTE — MAU Note (Signed)
Pt reports she doesn't know if it is her bladder or her uterus but something is protruding out x 1 week. States at night it seems to get better but if she is up and moving around it protrudes outward. States some burning with urination. Denies fever. S/p partial hysterectomy in 1982

## 2012-03-19 NOTE — MAU Provider Note (Signed)
History     CSN: [id number]  Arrival date and time: 03/19/12 1816   First Provider Initiated Contact with Patient 03/19/12 2030      Chief Complaint  Patient presents with  . Abdominal Pain   HPIPt is not pregnant and complains of something bulging out of her vagina.  Pt denies constipation or vaginal discharge.  Pt has not been sexually active in 3 years.  She had an abdominal hysterectomy in 2006 in [place], [place].  She has had 4 vaginal deliveries with "large" babies.  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    Past Medical History  Diagnosis Date  . Hypertension   . Diabetes mellitus     Past Surgical History  Procedure Date  . Abdominal hysterectomy   . Back surgery   . Wisdom tooth extraction   . Cholecystectomy   . Temporomandibular joint surgery     Family History  Problem Relation Age of Onset  . Heart disease Father   . Diabetes Maternal Grandmother     History  Substance Use Topics  . Smoking status: Never Smoker   . Smokeless tobacco: Not on file  . Alcohol Use: No    Allergies: No Known Allergies  Prescriptions prior to admission  Medication Sig Dispense Refill  . aspirin 81 MG tablet Take 81 mg by mouth daily.      Marland Kitchen atenolol (TENORMIN) 25 MG tablet Take 25 mg by mouth daily.      Marland Kitchen glipiZIDE (GLUCOTROL) 5 MG tablet Take 5 mg by mouth 2 (two) times daily before a meal.      . hydrochlorothiazide (HYDRODIURIL) 50 MG tablet Take 50 mg by mouth daily.      . Lansoprazole (PREVACID PO) Take by mouth.      . meloxicam (MOBIC) 7.5 MG tablet Take 1 tablet (7.5 mg total) by mouth daily.  15 tablet  0    ROS Physical Exam   Blood pressure 146/78, pulse 64, temperature 98.4 F (36.9 C), temperature source Oral, resp. rate 18, height 5\' 3"  (1.6 m), weight 199 lb (90.266 kg), SpO2 100.00%.  Physical Exam  Nursing note and vitals reviewed. Constitutional: She is oriented to person, place, and time.  She appears well-developed and well-nourished.  HENT:  Head: Normocephalic.  Eyes: Pupils are equal, round, and reactive to light.  Neck: Normal range of motion. Neck supple.  Cardiovascular: Normal rate.   Respiratory: Effort normal.  GI: Soft. She exhibits no distension. There is no tenderness. There is no rebound and no guarding.  Genitourinary:       cystocele with bladder bulging to introitus. Rectocele also noted with R-V exam  Musculoskeletal: Normal range of motion.  Neurological: She is alert and oriented to person, place, and time.  Skin: Skin is warm and dry.  Psychiatric: She has a normal mood and affect.    MAU Course  Procedures Exam revealing pelvic floor relaxation with 3rd degree cystocele and rectocele Results for orders placed during the hospital encounter of 03/19/12 (from the past 24 hour(s))  URINALYSIS, ROUTINE W REFLEX MICROSCOPIC     Status: Normal   Collection Time   03/19/12  7:20 PM      Component Value Range   Color, Urine YELLOW  YELLOW    APPearance CLEAR  CLEAR    Specific Gravity, Urine 1.020  1.005 - 1.030    pH 6.0  5.0 - 8.0  Glucose, UA NEGATIVE  NEGATIVE (mg/dL)   Hgb urine dipstick NEGATIVE  NEGATIVE    Bilirubin Urine NEGATIVE  NEGATIVE    Ketones, ur NEGATIVE  NEGATIVE (mg/dL)   Protein, ur NEGATIVE  NEGATIVE (mg/dL)   Urobilinogen, UA 0.2  0.0 - 1.0 (mg/dL)   Nitrite NEGATIVE  NEGATIVE    Leukocytes, UA NEGATIVE  NEGATIVE    Assessment and Plan  Pelvic floor relaxation Appointment with Dr. Marice Potter at GYN clinic- clinic to call pt with appointment Kegels and double voiding instructed Up to Date info given on Cystocele and Lindi Adie 03/19/2012, 8:44 PM

## 2012-03-22 ENCOUNTER — Ambulatory Visit (INDEPENDENT_AMBULATORY_CARE_PROVIDER_SITE_OTHER): Payer: Medicare Other | Admitting: Obstetrics & Gynecology

## 2012-03-22 ENCOUNTER — Encounter: Payer: Self-pay | Admitting: Obstetrics & Gynecology

## 2012-03-22 VITALS — BP 137/84 | HR 58 | Temp 97.5°F | Ht 63.0 in | Wt 198.0 lb

## 2012-03-22 DIAGNOSIS — IMO0002 Reserved for concepts with insufficient information to code with codable children: Secondary | ICD-10-CM

## 2012-03-22 DIAGNOSIS — I10 Essential (primary) hypertension: Secondary | ICD-10-CM

## 2012-03-22 DIAGNOSIS — E119 Type 2 diabetes mellitus without complications: Secondary | ICD-10-CM | POA: Insufficient documentation

## 2012-03-22 DIAGNOSIS — K219 Gastro-esophageal reflux disease without esophagitis: Secondary | ICD-10-CM

## 2012-03-22 DIAGNOSIS — N8111 Cystocele, midline: Secondary | ICD-10-CM

## 2012-03-22 MED ORDER — ESTROGENS, CONJUGATED 0.625 MG/GM VA CREA: TOPICAL_CREAM | Freq: Every day | VAGINAL | Status: DC

## 2012-03-22 NOTE — Patient Instructions (Signed)
[name] [name],  Thank you for coming in to see [place] today. You have a cystocele (bulging of the wall between the bladder and vagina into the vagina):  Please do the following 1. Start premarin cream every MWF 2. F/u in 1 mos for pessary placement  Drs. Colton Engdahl and [employer]

## 2012-03-22 NOTE — Progress Notes (Signed)
Patient ID: [name] [name], female   DOB: [birth year], 66 y.o.   MRN: [id number] Subjective:    [name] [name] is a 66 y.o. female who presents for evaluation of a cystocele and possible rectocele. Problem started 2 weeks ago. Symptoms include: prolapse of tissue with straining.  She denies urinary incontinence. Symptoms have stabilized.  Menstrual History: OB History    Grav Para Term Preterm Abortions TAB SAB Ect Mult Living   4 4        4      Obstetric Comments   Delivered 4 large babies vaginally      No LMP recorded. Patient is postmenopausal.    Review of Systems Pertinent items are noted in HPI.   Objective:     BP 137/84  Pulse 58  Temp 97.5 F (36.4 C)  Ht 5\' 3"  (1.6 m)  Wt 198 lb (89.812 kg)  BMI 35.07 kg/m2 Pelvis:  External genitalia: normal general appearance Urinary system: urethral meatus normal Vaginal: cystocele present, with straining.  Adnexa: normal bimanual exam Uterus: non-palpable Rectal: good sphincter tone, no masses and no rectocele     Assessment:    The patient has a cystocele   Plan:    Discussed cystoceles and management options with the patient. Agricultural engineer distributed. Discussed pessary and will plan visit for fitting. Initiate hormone replacement therapy per orders.

## 2012-03-22 NOTE — Assessment & Plan Note (Signed)
A: The patient has a cystocele  P: Discussed cystoceles and management options with the patient. Agricultural engineer distributed. Discussed pessary and will plan visit for fitting. Initiate hormone replacement therapy per orders

## 2012-04-25 ENCOUNTER — Ambulatory Visit (INDEPENDENT_AMBULATORY_CARE_PROVIDER_SITE_OTHER): Payer: Medicare Other | Admitting: Obstetrics & Gynecology

## 2012-04-25 ENCOUNTER — Encounter: Payer: Self-pay | Admitting: Obstetrics & Gynecology

## 2012-04-25 VITALS — BP 119/76 | HR 57 | Temp 99.0°F | Ht 63.0 in | Wt 198.9 lb

## 2012-04-25 DIAGNOSIS — IMO0002 Reserved for concepts with insufficient information to code with codable children: Secondary | ICD-10-CM

## 2012-04-25 DIAGNOSIS — E6609 Other obesity due to excess calories: Secondary | ICD-10-CM | POA: Insufficient documentation

## 2012-04-25 DIAGNOSIS — N8111 Cystocele, midline: Secondary | ICD-10-CM

## 2012-04-25 NOTE — Progress Notes (Signed)
  Subjective:    Patient ID: [name] [name], female    DOB: [birth year], 66 y.o.   MRN: [id number]  HPI  [name] [name] is here for a pessary fitting. She has been using her vaginal estrogen 3 times per week.  Review of Systems     Objective:   Physical Exam  Good estrogen effect on vulva/vagina A #5 ring pessary was placed and it resolved her symptoms. She was able to remove and replace it. I have advised her to use her estrogen 2 times per week.      Assessment & Plan:  RTC 2 months for pessary check.

## 2012-04-25 NOTE — Progress Notes (Signed)
Here today for pessary. C/o pelvic pressure, especiaLLY when lifting something or when bending over. Per fall screen, denies a fall in the last 6 months.

## 2012-05-05 ENCOUNTER — Encounter (HOSPITAL_COMMUNITY): Payer: Self-pay | Admitting: Emergency Medicine

## 2012-05-05 ENCOUNTER — Emergency Department (HOSPITAL_COMMUNITY): Payer: Medicare Other

## 2012-05-05 ENCOUNTER — Inpatient Hospital Stay (HOSPITAL_COMMUNITY)
Admission: EM | Admit: 2012-05-05 | Discharge: 2012-05-07 | DRG: 948 | Disposition: A | Discharge status: Home / Self Care | Payer: Medicare Other | Source: Ambulatory Visit | Attending: Internal Medicine | Admitting: Internal Medicine

## 2012-05-05 ENCOUNTER — Inpatient Hospital Stay (HOSPITAL_COMMUNITY): Payer: Medicare Other

## 2012-05-05 DIAGNOSIS — K219 Gastro-esophageal reflux disease without esophagitis: Secondary | ICD-10-CM | POA: Diagnosis present

## 2012-05-05 DIAGNOSIS — R5381 Other malaise: Principal | ICD-10-CM | POA: Diagnosis present

## 2012-05-05 DIAGNOSIS — E1165 Type 2 diabetes mellitus with hyperglycemia: Secondary | ICD-10-CM

## 2012-05-05 DIAGNOSIS — I10 Essential (primary) hypertension: Secondary | ICD-10-CM | POA: Diagnosis present

## 2012-05-05 DIAGNOSIS — Z833 Family history of diabetes mellitus: Secondary | ICD-10-CM

## 2012-05-05 DIAGNOSIS — E876 Hypokalemia: Secondary | ICD-10-CM | POA: Diagnosis present

## 2012-05-05 DIAGNOSIS — R079 Chest pain, unspecified: Secondary | ICD-10-CM

## 2012-05-05 DIAGNOSIS — E6609 Other obesity due to excess calories: Secondary | ICD-10-CM | POA: Diagnosis present

## 2012-05-05 DIAGNOSIS — G459 Transient cerebral ischemic attack, unspecified: Secondary | ICD-10-CM

## 2012-05-05 DIAGNOSIS — R531 Weakness: Secondary | ICD-10-CM

## 2012-05-05 DIAGNOSIS — E119 Type 2 diabetes mellitus without complications: Secondary | ICD-10-CM | POA: Diagnosis present

## 2012-05-05 DIAGNOSIS — E669 Obesity, unspecified: Secondary | ICD-10-CM | POA: Diagnosis present

## 2012-05-05 DIAGNOSIS — I639 Cerebral infarction, unspecified: Secondary | ICD-10-CM | POA: Insufficient documentation

## 2012-05-05 DIAGNOSIS — Z6834 Body mass index (BMI) 34.0-34.9, adult: Secondary | ICD-10-CM

## 2012-05-05 DIAGNOSIS — R209 Unspecified disturbances of skin sensation: Secondary | ICD-10-CM | POA: Diagnosis present

## 2012-05-05 DIAGNOSIS — IMO0002 Reserved for concepts with insufficient information to code with codable children: Secondary | ICD-10-CM

## 2012-05-05 DIAGNOSIS — IMO0001 Reserved for inherently not codable concepts without codable children: Secondary | ICD-10-CM | POA: Diagnosis present

## 2012-05-05 DIAGNOSIS — Z8673 Personal history of transient ischemic attack (TIA), and cerebral infarction without residual deficits: Secondary | ICD-10-CM

## 2012-05-05 DIAGNOSIS — R001 Bradycardia, unspecified: Secondary | ICD-10-CM

## 2012-05-05 DIAGNOSIS — R51 Headache: Secondary | ICD-10-CM

## 2012-05-05 DIAGNOSIS — Z87891 Personal history of nicotine dependence: Secondary | ICD-10-CM

## 2012-05-05 HISTORY — DX: Cerebral infarction, unspecified: I63.9

## 2012-05-05 HISTORY — DX: Gastro-esophageal reflux disease without esophagitis: K21.9

## 2012-05-05 HISTORY — DX: Chest pain, unspecified: R07.9

## 2012-05-05 LAB — CK TOTAL AND CKMB (NOT AT ARMC)
CK, MB: 1.6 ng/mL (ref 0.3–4.0)
Relative Index: 1.4 (ref 0.0–2.5)
Total CK: 116 U/L (ref 7–177)

## 2012-05-05 LAB — COMPREHENSIVE METABOLIC PANEL
Albumin: 3.3 g/dL — ABNORMAL LOW (ref 3.5–5.2)
BUN: 18 mg/dL (ref 6–23)
Calcium: 9.5 mg/dL (ref 8.4–10.5)
Creatinine, Ser: 0.92 mg/dL (ref 0.50–1.10)
GFR calc Af Amer: 74 mL/min — ABNORMAL LOW (ref >90)
Glucose, Bld: 172 mg/dL — ABNORMAL HIGH (ref 70–99)
Total Protein: 7 g/dL (ref 6.0–8.3)

## 2012-05-05 LAB — URINALYSIS, ROUTINE W REFLEX MICROSCOPIC
Bilirubin Urine: NEGATIVE
Ketones, ur: NEGATIVE mg/dL
Nitrite: NEGATIVE
Protein, ur: NEGATIVE mg/dL
Urobilinogen, UA: 0.2 mg/dL (ref 0.0–1.0)
pH: 7.5 (ref 5.0–8.0)

## 2012-05-05 LAB — CBC
Hemoglobin: 13.8 g/dL (ref 12.0–15.0)
MCH: 30.1 pg (ref 26.0–34.0)
MCHC: 33.6 g/dL (ref 30.0–36.0)
MCV: 89.5 fL (ref 78.0–100.0)
Platelets: 281 10*3/uL (ref 150–400)
RBC: 4.59 MIL/uL (ref 3.87–5.11)

## 2012-05-05 LAB — DIFFERENTIAL
Basophils Relative: 0 % (ref 0–1)
Eosinophils Absolute: 0.2 10*3/uL (ref 0.0–0.7)
Eosinophils Relative: 4 % (ref 0–5)
Lymphs Abs: 2 10*3/uL (ref 0.7–4.0)
Monocytes Relative: 7 % (ref 3–12)
Neutrophils Relative %: 44 % (ref 43–77)

## 2012-05-05 LAB — GLUCOSE, CAPILLARY
Glucose-Capillary: 134 mg/dL — ABNORMAL HIGH (ref 70–99)
Glucose-Capillary: 141 mg/dL — ABNORMAL HIGH (ref 70–99)

## 2012-05-05 LAB — CARDIAC PANEL(CRET KIN+CKTOT+MB+TROPI)
CK, MB: 2 ng/mL (ref 0.3–4.0)
Relative Index: INVALID (ref 0.0–2.5)
Total CK: 93 U/L (ref 7–177)
Total CK: 86 U/L (ref 7–177)
Troponin I: <0.3 ng/mL (ref <0.30)

## 2012-05-05 LAB — PROTIME-INR
INR: 1.04 (ref 0.00–1.49)
Prothrombin Time: 13.8 seconds (ref 11.6–15.2)

## 2012-05-05 LAB — TROPONIN I: Troponin I: <0.3 ng/mL (ref <0.30)

## 2012-05-05 MED ORDER — SODIUM CHLORIDE 0.9 % IV SOLN
INTRAVENOUS | Status: AC
  Administered 2012-05-05 (×2): via INTRAVENOUS

## 2012-05-05 MED ORDER — GLIPIZIDE 5 MG PO TABS
5.0000 mg | ORAL_TABLET | Freq: Every day | ORAL | Status: DC
  Administered 2012-05-05 – 2012-05-07 (×3): 5 mg via ORAL
  Filled 2012-05-05 (×3): qty 1

## 2012-05-05 MED ORDER — ENOXAPARIN SODIUM 40 MG/0.4ML ~~LOC~~ SOLN
40.0000 mg | SUBCUTANEOUS | Status: DC
  Administered 2012-05-05 – 2012-05-07 (×3): 40 mg via SUBCUTANEOUS
  Filled 2012-05-05 (×3): qty 0.4

## 2012-05-05 MED ORDER — PANTOPRAZOLE SODIUM 40 MG PO TBEC
40.0000 mg | DELAYED_RELEASE_TABLET | Freq: Every day | ORAL | Status: DC
  Administered 2012-05-05 – 2012-05-07 (×3): 40 mg via ORAL
  Filled 2012-05-05 (×3): qty 1

## 2012-05-05 MED ORDER — ESTROGENS, CONJUGATED 0.625 MG/GM VA CREA
1.0000 g | TOPICAL_CREAM | VAGINAL | Status: DC
  Administered 2012-05-06: 0.25 via VAGINAL
  Filled 2012-05-05: qty 42.5

## 2012-05-05 MED ORDER — SENNOSIDES-DOCUSATE SODIUM 8.6-50 MG PO TABS: 1.0000 | ORAL_TABLET | Freq: Every evening | ORAL | Status: DC | PRN

## 2012-05-05 MED ORDER — CLOPIDOGREL BISULFATE 75 MG PO TABS
75.0000 mg | ORAL_TABLET | Freq: Every day | ORAL | Status: DC
  Administered 2012-05-06 – 2012-05-07 (×2): 75 mg via ORAL
  Filled 2012-05-05 (×4): qty 1

## 2012-05-05 MED ORDER — VITAMIN D3 25 MCG (1000 UNIT) PO TABS
1000.0000 [IU] | ORAL_TABLET | Freq: Every day | ORAL | Status: DC
  Administered 2012-05-05 – 2012-05-07 (×3): 1000 [IU] via ORAL
  Filled 2012-05-05 (×3): qty 1

## 2012-05-05 MED ORDER — HYDRALAZINE HCL 20 MG/ML IJ SOLN
10.0000 mg | Freq: Four times a day (QID) | INTRAMUSCULAR | Status: DC | PRN
  Filled 2012-05-05: qty 0.5

## 2012-05-05 MED ORDER — INSULIN ASPART 100 UNIT/ML ~~LOC~~ SOLN
0.0000 [IU] | Freq: Three times a day (TID) | SUBCUTANEOUS | Status: DC
  Administered 2012-05-06 (×2): 1 [IU] via SUBCUTANEOUS
  Administered 2012-05-07: 2 [IU] via SUBCUTANEOUS

## 2012-05-05 MED ORDER — ACETAMINOPHEN 325 MG PO TABS
650.0000 mg | ORAL_TABLET | ORAL | Status: DC | PRN
  Administered 2012-05-05 – 2012-05-07 (×2): 650 mg via ORAL
  Filled 2012-05-05: qty 2

## 2012-05-05 MED ORDER — IBUPROFEN 400 MG PO TABS
400.0000 mg | ORAL_TABLET | Freq: Four times a day (QID) | ORAL | Status: DC | PRN
  Filled 2012-05-05: qty 1

## 2012-05-05 MED ORDER — POTASSIUM CHLORIDE CRYS ER 20 MEQ PO TBCR
20.0000 meq | EXTENDED_RELEASE_TABLET | Freq: Once | ORAL | Status: AC
  Administered 2012-05-05: 20 meq via ORAL
  Filled 2012-05-05: qty 1

## 2012-05-05 MED ORDER — ACETAMINOPHEN 650 MG RE SUPP: 650.0000 mg | RECTAL | Status: DC | PRN

## 2012-05-05 MED ORDER — BUTALBITAL-APAP-CAFFEINE 50-325-40 MG PO TABS
1.0000 | ORAL_TABLET | Freq: Four times a day (QID) | ORAL | Status: DC | PRN
  Administered 2012-05-05 – 2012-05-06 (×4): 1 via ORAL
  Filled 2012-05-05 (×4): qty 1

## 2012-05-05 MED ORDER — ONDANSETRON HCL 4 MG/2ML IJ SOLN
4.0000 mg | Freq: Four times a day (QID) | INTRAMUSCULAR | Status: DC | PRN
  Administered 2012-05-05: 4 mg via INTRAVENOUS
  Filled 2012-05-05: qty 2

## 2012-05-05 MED ORDER — ATENOLOL 12.5 MG HALF TABLET
12.5000 mg | ORAL_TABLET | Freq: Every day | ORAL | Status: DC
  Administered 2012-05-05 – 2012-05-07 (×3): 12.5 mg via ORAL
  Filled 2012-05-05 (×3): qty 1

## 2012-05-05 NOTE — ED Notes (Signed)
Admitting physician at the bedside.

## 2012-05-05 NOTE — ED Notes (Addendum)
Per EMS:  Pt reports Left arm tingling and weakness since today at 0530 - EMS reports pt seemed anxious and was able to ambulate to the stretcher - with normal gait  Pt received 4 mg of zofran PTA

## 2012-05-05 NOTE — ED Provider Notes (Signed)
History     CSN: [id number]  Arrival date & time 05/05/12  4098   First MD Initiated Contact with Patient 05/05/12 [phone]      Chief Complaint  Patient presents with  . Weakness    (Consider location/radiation/quality/duration/timing/severity/associated sxs/prior treatment) Patient is a 66 y.o. female presenting with weakness. The history is provided by the patient.  Weakness The primary symptoms include headaches and nausea. Primary symptoms do not include vomiting.  The headache is associated with weakness. The headache is not associated with eye pain or neck stiffness.  Additional symptoms include weakness. Additional symptoms do not include neck stiffness or tinnitus.   patient states that she woke up this morning and felt lightheaded. States that she felt like she was going to pass out. She also states she has tingling in her left hand. She states that she had trouble walking and somewhat using her left hand. She states it felt like she was going to pass out. She has a mild headache. The symptoms were present when she woke up. She states she has some tingling in her left chest also. No fevers. No cough. She's had nauseousness. No actual vomiting. No diarrhea. No palpitations. She states she had symptoms like this in the late 80s when she had a TIA.  Past Medical History  Diagnosis Date  . Hypertension   . Diabetes mellitus   . Obesity (BMI 35.0-39.9 without comorbidity)   . GERD (gastroesophageal reflux disease)     Past Surgical History  Procedure Date  . Back surgery   . Wisdom tooth extraction   . Cholecystectomy   . Temporomandibular joint surgery   . Abdominal hysterectomy 2006    Partial, one ovary left     Family History  Problem Relation Age of Onset  . Heart disease Father   . Diabetes Maternal Grandmother     History  Substance Use Topics  . Smoking status: Never Smoker   . Smokeless tobacco: Never Used  . Alcohol Use: No    OB History    Grav Para  Term Preterm Abortions TAB SAB Ect Mult Living   4 4        4      Obstetric Comments   Delivered 4 large babies vaginally      Review of Systems  Constitutional: Negative for activity change and appetite change.  HENT: Negative for ear pain, neck stiffness and tinnitus.   Eyes: Negative for pain.  Respiratory: Positive for shortness of breath. Negative for chest tightness.   Cardiovascular: Negative for chest pain and leg swelling.  Gastrointestinal: Positive for nausea. Negative for vomiting, abdominal pain and diarrhea.  Genitourinary: Negative for flank pain.  Musculoskeletal: Negative for back pain.  Skin: Negative for rash.  Neurological: Positive for weakness, light-headedness and headaches. Negative for numbness.  Psychiatric/Behavioral: Negative for behavioral problems.    Allergies  Codeine  Home Medications   Current Outpatient Rx  Name Route Sig Dispense Refill  . ASPIRIN 81 MG PO TABS Oral Take 81 mg by mouth daily.    . ATENOLOL 25 MG PO TABS Oral Take 25 mg by mouth daily.    Marland Kitchen VITAMIN D 1000 UNITS PO TABS Oral Take 1,000 Units by mouth daily.    Marland Kitchen ESTROGENS, CONJUGATED 0.625 MG/GM VA CREA Vaginal Place 1 g vaginally 2 (two) times a week.    Marland Kitchen GLIPIZIDE 5 MG PO TABS Oral Take 5 mg by mouth daily.     Marland Kitchen HYDROCHLOROTHIAZIDE 50 MG PO  TABS Oral Take 50 mg by mouth daily.    Marland Kitchen PREVACID PO Oral Take 30 mg by mouth.       BP 140/78  Pulse 53  Temp 97.9 F (36.6 C) (Oral)  Resp 18  SpO2 97%  Physical Exam  Nursing note and vitals reviewed. Constitutional: She is oriented to person, place, and time. She appears well-developed and well-nourished.  HENT:  Head: Normocephalic and atraumatic.  Eyes: EOM are normal. Pupils are equal, round, and reactive to light.  Neck: Normal range of motion. Neck supple.  Cardiovascular: Normal rate, regular rhythm and normal heart sounds.   No murmur heard. Pulmonary/Chest: Effort normal and breath sounds normal. No  respiratory distress. She has no wheezes. She has no rales.  Abdominal: Soft. Bowel sounds are normal. She exhibits no distension. There is no tenderness. There is no rebound and no guarding.  Musculoskeletal: Normal range of motion.  Neurological: She is alert and oriented to person, place, and time. No cranial nerve deficit.       Patient states there is tingling to the left side of her face with palpation. Equal grip strength bilaterally. Finger-nose is off on the left. It is normal on right. Straight leg raise on the left deviates laterally. Heel shin is off with the left foot on the right shin. There is good strength in both legs. Heel shin is normal with a right heel on the left shin. Extraocular movements are intact. There is a nystagmus with gaze in both directions  Skin: Skin is warm and dry.  Psychiatric: She has a normal mood and affect. Her speech is normal.    ED Course  Procedures (including critical care time)  Labs Reviewed  COMPREHENSIVE METABOLIC PANEL - Abnormal; Notable for the following:    Potassium 3.4 (*)     Glucose, Bld 172 (*)     Albumin 3.3 (*)     GFR calc non Af Amer 63 (*)     GFR calc Af Amer 74 (*)     All other components within normal limits  PROTIME-INR  APTT  CBC  DIFFERENTIAL  CK TOTAL AND CKMB  TROPONIN I  URINALYSIS, ROUTINE W REFLEX MICROSCOPIC   Dg Chest 2 View  05/05/2012  *RADIOLOGY REPORT*  Clinical Data: Left-sided chest pain, weakness and shortness of breath.  CHEST - 2 VIEW  Comparison: Chest x-ray 07/11/2011.  Findings: Lung volumes are normal.  No consolidative airspace disease.  No pleural effusions.  Pulmonary vasculature is normal. Heart size is mildly enlarged (unchanged).  Marked tortuosity of the thoracic aorta.  Surgical clips project over the right upper quadrant of the abdomen, likely from prior cholecystectomy.  IMPRESSION: 1.  No radiographic evidence of acute cardiopulmonary disease. 2.  Mild cardiomegaly is unchanged. 3.   Marked tortuosity of the thoracic aorta. 4.  Status post cholecystectomy.  Original Report Authenticated By: Florencia Reasons, M.D.   Ct Head Wo Contrast  05/05/2012  *RADIOLOGY REPORT*  Clinical Data: Left arm tingling and weakness.  Diabetic and hypertensive patient.  CT HEAD WITHOUT CONTRAST  Technique:  Contiguous axial images were obtained from the base of the skull through the vertex without contrast.  Comparison: 07/08/2011.  Findings: No intracranial hemorrhage. No intracranial mass lesion detected on this unenhanced exam.  No hydrocephalus.  Mild atrophy.  Remote right external capsule infarct.  Questionable subtle acute right frontal lobe infarct?  Tortuous basilar artery unchanged.  Partially empty sella stable. Prominent ossification of the falx greater  to the right once again noted.  Partial opacification ethmoid sinus air cells.  Moderate mucosal thickening with complex appearance right maxillary sinus suggesting chronic changes with adjacent bone thickening.  IMPRESSION: Question small acute right frontal lobe infarct without associated hemorrhage. Evaluation for posterior fossa stroke limited by streak artifact.  Maxillary sinus mucosal thickening and partial opacification ethmoid sinus air cells.  Please see above.  Results discussed with Dr. Rubin Payor 05/05/2012 8:11 a.m.  Original Report Authenticated By: Fuller Canada, M.D.     1. Stroke     Date: 05/05/2012  Rate: 51  Rhythm: sinus bradycardia  QRS Axis: normal  Intervals: normal  ST/T Wave abnormalities: nonspecific T wave changes  Conduction Disutrbances:none  Narrative Interpretation:   Old EKG Reviewed: unchanged     MDM  Patient woke with dizziness and some left-sided neurologic symptoms. She has focal findings on the face and left extremities. CT shows possibly a frontal stroke. Patient continues her deficits will be admitted to medicine.        Juliet Rude. Rubin Payor, MD 05/05/12 [phone]

## 2012-05-05 NOTE — H&P (Signed)
Patient's PCP: Florentina Jenny, MD  Chief Complaint: Left-sided numbness and weakness, left-sided chest pain  History of Present Illness: [name] [name] is a 66 y.o. African American female with history of hypertension, diabetes, obesity, GERD, CVA in 1987 who presents with the above complaints.  Patient noted that over the last week she has been feeling "bad," where she was feeling nauseated.  She woke up this morning at 5 o'clock with left-sided numbness, weakness, and chest pain.  She presented to the emergency department where on imaging patient is noted to have question of small acute right frontal lobe infarct without hemorrhage.  The hospitalist service was asked to admit the patient for further care and management.  Patient denies any recent fevers or chills.  Has been feeling nauseated but has not vomited.  Does complain of left-sided chest pain likely due to her left-sided numbness and weakness.  Did complain of shortness of breath this morning.  Does have a headache.  Denies any vision changes.  Complained of intermittent right lower quadrant abdominal pain which she has had over the last 6 months which she attributes to GERD.  Past Medical History  Diagnosis Date  . Hypertension   . Diabetes mellitus   . Obesity (BMI 35.0-39.9 without comorbidity)   . GERD (gastroesophageal reflux disease)    Past Surgical History  Procedure Date  . Back surgery   . Wisdom tooth extraction   . Cholecystectomy   . Temporomandibular joint surgery   . Abdominal hysterectomy 2006    Partial, one ovary left    Family History  Problem Relation Age of Onset  . Heart disease Father   . Diabetes Maternal Grandmother   . Pancreatitis Mother    History   Social History  . Marital Status: Married    Spouse Name: N/A    Number of Children: N/A  . Years of Education: N/A   Occupational History  . Not on file.   Social History Main Topics  . Smoking status: Former Smoker -- 1.0 packs/day for .5  years    Types: Cigarettes    Quit date: 05/05/1969  . Smokeless tobacco: Never Used  . Alcohol Use: No  . Drug Use: No  . Sexually Active: No   Other Topics Concern  . Not on file   Social History Narrative  . No narrative on file   Allergies: Codeine  Meds: Scheduled Meds:   . potassium chloride  20 mEq Oral Once   Continuous Infusions:  PRN Meds:.  Review of Systems: All systems reviewed with the patient and positive as per history of present illness, otherwise all other systems are negative.  Physical Exam: Blood pressure 140/78, pulse 53, temperature 97.9 F (36.6 C), temperature source Oral, resp. rate 18, SpO2 97.00%. General: Awake, Oriented x3, No acute distress. HEENT: EOMI, Moist mucous membranes, to inject, slightly erythematous bilaterally with excessive tearing in both eyes. Neck: Supple CV: S1 and S2 Lungs: Clear to ascultation bilaterally Abdomen: Soft, Nontender, Nondistended, +bowel sounds. Ext: Good pulses. Trace edema. No clubbing or cyanosis noted. Neuro: Cranial Nerves II-XII grossly intact, except diminished sensation to touch on the left side of the face. Has 4/5 motor strength in left upper and lower extremities, diminished sensation on left side.  5/5 motor strength in right upper and lower extremities.  Lab results:  [facility] 05/05/12 0732  NA 139  K 3.4*  CL 101  CO2 28  GLUCOSE 172*  BUN 18  CREATININE 0.92  CALCIUM 9.5  MG --  PHOS --    Basename 05/05/12 0732  AST 14  ALT 11  ALKPHOS 80  BILITOT 0.4  PROT 7.0  ALBUMIN 3.3*   No results found for this basename: LIPASE:2,AMYLASE:2 in the last 72 hours  Basename 05/05/12 0732  WBC 4.5  NEUTROABS 1.9  HGB 13.8  HCT 41.1  MCV 89.5  PLT 281    Basename 05/05/12 0732  CKTOTAL 116  CKMB 1.6  CKMBINDEX --  TROPONINI <0.30   No components found with this basename: POCBNP:3 No results found for this basename: DDIMER in the last 72 hours No results found for this  basename: HGBA1C:2 in the last 72 hours No results found for this basename: CHOL:2,HDL:2,LDLCALC:2,TRIG:2,CHOLHDL:2,LDLDIRECT:2 in the last 72 hours No results found for this basename: TSH,T4TOTAL,FREET3,T3FREE,THYROIDAB in the last 72 hours No results found for this basename: VITAMINB12:2,FOLATE:2,FERRITIN:2,TIBC:2,IRON:2,RETICCTPCT:2 in the last 72 hours Imaging results:  Dg Chest 2 View  05/05/2012  *RADIOLOGY REPORT*  Clinical Data: Left-sided chest pain, weakness and shortness of breath.  CHEST - 2 VIEW  Comparison: Chest x-ray 07/11/2011.  Findings: Lung volumes are normal.  No consolidative airspace disease.  No pleural effusions.  Pulmonary vasculature is normal. Heart size is mildly enlarged (unchanged).  Marked tortuosity of the thoracic aorta.  Surgical clips project over the right upper quadrant of the abdomen, likely from prior cholecystectomy.  IMPRESSION: 1.  No radiographic evidence of acute cardiopulmonary disease. 2.  Mild cardiomegaly is unchanged. 3.  Marked tortuosity of the thoracic aorta. 4.  Status post cholecystectomy.  Original Report Authenticated By: Florencia Reasons, M.D.   Ct Head Wo Contrast  05/05/2012  *RADIOLOGY REPORT*  Clinical Data: Left arm tingling and weakness.  Diabetic and hypertensive patient.  CT HEAD WITHOUT CONTRAST  Technique:  Contiguous axial images were obtained from the base of the skull through the vertex without contrast.  Comparison: 07/08/2011.  Findings: No intracranial hemorrhage. No intracranial mass lesion detected on this unenhanced exam.  No hydrocephalus.  Mild atrophy.  Remote right external capsule infarct.  Questionable subtle acute right frontal lobe infarct?  Tortuous basilar artery unchanged.  Partially empty sella stable. Prominent ossification of the falx greater to the right once again noted.  Partial opacification ethmoid sinus air cells.  Moderate mucosal thickening with complex appearance right maxillary sinus suggesting chronic  changes with adjacent bone thickening.  IMPRESSION: Question small acute right frontal lobe infarct without associated hemorrhage. Evaluation for posterior fossa stroke limited by streak artifact.  Maxillary sinus mucosal thickening and partial opacification ethmoid sinus air cells.  Please see above.  Results discussed with Dr. Rubin Payor 05/05/2012 8:11 a.m.  Original Report Authenticated By: Fuller Canada, M.D.   Other results: EKG: normal sinus rhythm.  Assessment & Plan by Problem: Left-sided weakness and numbness likely due to acute right frontal lobe infarct As patient woke up this morning with the symptoms, not a candidate for TPA.  Initiate stroke workup.  MRI/MRA of head to be done.  Will get carotid Dopplers and 2-D echocardiogram.  Allow permissive hypertension for the next 24 hours, treat if systolic blood pressure greater than 185 with when necessary hydralazine.  Discussed with Dr. Roseanne Reno, neurology, as the patient has been on aspirin, will start the patient on Plavix and discontinue aspirin.  Hold off on official neurology consult at this time.  Ordered PT/OT.  Will need risk factor modifications.  Send for lipid panel.  Type 2 diabetes uncontrolled with complications Continue glipizide.  Sensitive sliding scale  insulin.  Hemoglobin A1c to be done as part of the workup.  Hypertension As indicated above will allow for permissive hypertension. hold hydrochlorothiazide.  Decreased dose of atenolol.  Chest pain EKG shows sinus rhythm.  Initial troponin negative.  2-D echocardiogram ordered and pending.  Do not suspect this is cardiac in nature, suspect patient's left-sided weakness and numbness which the patient does attribute into her chest pain.  GERD with intermittent right lower quadrant abdominal pain. Continue PPI.  Obesity Diet and exercise as outpatient.  Prophylaxis Lovenox.  CODE STATUS Full code.  Patient did indicate that she does not wish to be on life support  long-term, but for short-term wants everything done.  Time spent on admission, talking to the patient, and coordinating care was: 60 mins.  Jhordan Kinter A, MD 05/05/2012, 9:27 AM

## 2012-05-05 NOTE — Progress Notes (Signed)
*  PRELIMINARY RESULTS* Vascular Ultrasound Carotid Duplex (Doppler) has been completed.  Preliminary findings: Bilaterally no significant ICA stenosis with antegrade vertebral flow.    Farrel Demark, RDMS     05/05/2012, 2:39 PM

## 2012-05-05 NOTE — Consult Note (Signed)
Reason for Consult:TIA  Referring Physician: Dr Deniece Portela L Scherer is an 66 y.o. female.  HPI: 66 y.o. African American female with history of hypertension, diabetes, obesity, GERD, CVA in 70 ([place], [place] area) who is seen for possible TIA vs stroke.  Hx per pt (fair historian).   Patient noted that over the last week she has been feeling "bad," (nausea). She woke up at [facility] for work and note left-sided numbness, weakness/heaviness, and chest pain. Also noted some trouble getting out her words and felt near-syncopal.  Initially no headache, but then developed a "10/10" headache (now a 7/10).  She notes last significant headache was "years" ago.  EMS summoned and in ER had Head CT noted to have question of small acute right frontal lobe infarct without hemorrhage.  Since admission she notes she now feels better, but has not yet gotten out of bed. Denies diplopia or right sided complaints.   Does note a similar event in 1986 ([place], [place]) and was told she had a "TIA" at the time and placed on aspirin.  Past Medical History  Diagnosis Date  . Hypertension   . Diabetes mellitus   . Obesity (BMI 35.0-39.9 without comorbidity)   . GERD (gastroesophageal reflux disease)     Past Surgical History  Procedure Date  . Back surgery   . Wisdom tooth extraction   . Cholecystectomy   . Temporomandibular joint surgery   . Abdominal hysterectomy 2006    Partial, one ovary left     Family History  Problem Relation Age of Onset  . Heart disease Father   . Diabetes Maternal Grandmother   . Pancreatitis Mother     Social History:  reports that she quit smoking about 43 years ago. Her smoking use included Cigarettes. She has a .5 pack-year smoking history. She has never used smokeless tobacco. She reports that she does not drink alcohol or use illicit drugs.  Allergies:  Allergies  Allergen Reactions  . Codeine Nausea And Vomiting    Medications:  Scheduled:   . atenolol  12.5 mg  Oral Daily  . cholecalciferol  1,000 Units Oral Daily  . clopidogrel  75 mg Oral Q breakfast  . conjugated estrogens  1 g Vaginal 2 times weekly  . enoxaparin  40 mg Subcutaneous Q24H  . glipiZIDE  5 mg Oral Daily  . insulin aspart  0-9 Units Subcutaneous TID WC  . pantoprazole  40 mg Oral Daily  . potassium chloride  20 mEq Oral Once    Results for orders placed during the hospital encounter of 05/05/12 (from the past 48 hour(s))  PROTIME-INR     Status: Normal   Collection Time   05/05/12  7:32 AM      Component Value Range Comment   Prothrombin Time 13.8  11.6 - 15.2 seconds    INR 1.04  0.00 - 1.49   APTT     Status: Normal   Collection Time   05/05/12  7:32 AM      Component Value Range Comment   aPTT 34  24 - 37 seconds   CBC     Status: Normal   Collection Time   05/05/12  7:32 AM      Component Value Range Comment   WBC 4.5  4.0 - 10.5 K/uL    RBC 4.59  3.87 - 5.11 MIL/uL    Hemoglobin 13.8  12.0 - 15.0 g/dL    HCT 16.1  09.6 - 04.5 %  MCV 89.5  78.0 - 100.0 fL    MCH 30.1  26.0 - 34.0 pg    MCHC 33.6  30.0 - 36.0 g/dL    RDW 40.9  81.1 - 91.4 %    Platelets 281  150 - 400 K/uL   DIFFERENTIAL     Status: Normal   Collection Time   05/05/12  7:32 AM      Component Value Range Comment   Neutrophils Relative 44  43 - 77 %    Neutro Abs 1.9  1.7 - 7.7 K/uL    Lymphocytes Relative 45  12 - 46 %    Lymphs Abs 2.0  0.7 - 4.0 K/uL    Monocytes Relative 7  3 - 12 %    Monocytes Absolute 0.3  0.1 - 1.0 K/uL    Eosinophils Relative 4  0 - 5 %    Eosinophils Absolute 0.2  0.0 - 0.7 K/uL    Basophils Relative 0  0 - 1 %    Basophils Absolute 0.0  0.0 - 0.1 K/uL   COMPREHENSIVE METABOLIC PANEL     Status: Abnormal   Collection Time   05/05/12  7:32 AM      Component Value Range Comment   Sodium 139  135 - 145 mEq/L    Potassium 3.4 (*) 3.5 - 5.1 mEq/L    Chloride 101  96 - 112 mEq/L    CO2 28  19 - 32 mEq/L    Glucose, Bld 172 (*) 70 - 99 mg/dL    BUN 18  6 - 23  mg/dL    Creatinine, Ser 7.82  0.50 - 1.10 mg/dL    Calcium 9.5  8.4 - 95.6 mg/dL    Total Protein 7.0  6.0 - 8.3 g/dL    Albumin 3.3 (*) 3.5 - 5.2 g/dL    AST 14  0 - 37 U/L HEMOLYSIS AT THIS LEVEL MAY AFFECT RESULT   ALT 11  0 - 35 U/L    Alkaline Phosphatase 80  39 - 117 U/L    Total Bilirubin 0.4  0.3 - 1.2 mg/dL    GFR calc non Af Amer 63 (*) >90 mL/min    GFR calc Af Amer 74 (*) >90 mL/min   CK TOTAL AND CKMB     Status: Normal   Collection Time   05/05/12  7:32 AM      Component Value Range Comment   Total CK 116  7 - 177 U/L HEMOLYSIS AT THIS LEVEL MAY AFFECT RESULT   CK, MB 1.6  0.3 - 4.0 ng/mL    Relative Index 1.4  0.0 - 2.5   TROPONIN I     Status: Normal   Collection Time   05/05/12  7:32 AM      Component Value Range Comment   Troponin I <0.30  <0.30 ng/mL   URINALYSIS, ROUTINE W REFLEX MICROSCOPIC     Status: Normal   Collection Time   05/05/12 11:07 AM      Component Value Range Comment   Color, Urine YELLOW  YELLOW    APPearance CLEAR  CLEAR    Specific Gravity, Urine 1.009  1.005 - 1.030    pH 7.5  5.0 - 8.0    Glucose, UA NEGATIVE  NEGATIVE mg/dL    Hgb urine dipstick NEGATIVE  NEGATIVE    Bilirubin Urine NEGATIVE  NEGATIVE    Ketones, ur NEGATIVE  NEGATIVE mg/dL    Protein, ur NEGATIVE  NEGATIVE mg/dL    Urobilinogen, UA 0.2  0.0 - 1.0 mg/dL    Nitrite NEGATIVE  NEGATIVE    Leukocytes, UA NEGATIVE  NEGATIVE MICROSCOPIC NOT DONE ON URINES WITH NEGATIVE PROTEIN, BLOOD, LEUKOCYTES, NITRITE, OR GLUCOSE <1000 mg/dL.  CARDIAC PANEL(CRET KIN+CKTOT+MB+TROPI)     Status: Normal   Collection Time   05/05/12 11:26 AM      Component Value Range Comment   Total CK 93  7 - 177 U/L    CK, MB 2.0  0.3 - 4.0 ng/mL    Troponin I <0.30  <0.30 ng/mL    Relative Index RELATIVE INDEX IS INVALID  0.0 - 2.5   GLUCOSE, CAPILLARY     Status: Abnormal   Collection Time   05/05/12 11:38 AM      Component Value Range Comment   Glucose-Capillary 134 (*) 70 - 99 mg/dL   CARDIAC  PANEL(CRET KIN+CKTOT+MB+TROPI)     Status: Normal   Collection Time   05/05/12  3:27 PM      Component Value Range Comment   Total CK 86  7 - 177 U/L    CK, MB 2.2  0.3 - 4.0 ng/mL    Troponin I <0.30  <0.30 ng/mL    Relative Index RELATIVE INDEX IS INVALID  0.0 - 2.5   GLUCOSE, CAPILLARY     Status: Abnormal   Collection Time   05/05/12  4:39 PM      Component Value Range Comment   Glucose-Capillary 141 (*) 70 - 99 mg/dL     Dg Chest 2 View  1/91/4782  *RADIOLOGY REPORT*  Clinical Data: Left-sided chest pain, weakness and shortness of breath.  CHEST - 2 VIEW  Comparison: Chest x-ray 07/11/2011.  Findings: Lung volumes are normal.  No consolidative airspace disease.  No pleural effusions.  Pulmonary vasculature is normal. Heart size is mildly enlarged (unchanged).  Marked tortuosity of the thoracic aorta.  Surgical clips project over the right upper quadrant of the abdomen, likely from prior cholecystectomy.  IMPRESSION: 1.  No radiographic evidence of acute cardiopulmonary disease. 2.  Mild cardiomegaly is unchanged. 3.  Marked tortuosity of the thoracic aorta. 4.  Status post cholecystectomy.  Original Report Authenticated By: Florencia Reasons, M.D.   Ct Head Wo Contrast  05/05/2012  *RADIOLOGY REPORT*  Clinical Data: Left arm tingling and weakness.  Diabetic and hypertensive patient.  CT HEAD WITHOUT CONTRAST  Technique:  Contiguous axial images were obtained from the base of the skull through the vertex without contrast.  Comparison: 07/08/2011.  Findings: No intracranial hemorrhage. No intracranial mass lesion detected on this unenhanced exam.  No hydrocephalus.  Mild atrophy.  Remote right external capsule infarct.  Questionable subtle acute right frontal lobe infarct?  Tortuous basilar artery unchanged.  Partially empty sella stable. Prominent ossification of the falx greater to the right once again noted.  Partial opacification ethmoid sinus air cells.  Moderate mucosal thickening with  complex appearance right maxillary sinus suggesting chronic changes with adjacent bone thickening.  IMPRESSION: Question small acute right frontal lobe infarct without associated hemorrhage. Evaluation for posterior fossa stroke limited by streak artifact.  Maxillary sinus mucosal thickening and partial opacification ethmoid sinus air cells.  Please see above.  Results discussed with Dr. Rubin Payor 05/05/2012 8:11 a.m.  Original Report Authenticated By: Fuller Canada, M.D.   Mr Brain Wo Contrast  05/05/2012  *RADIOLOGY REPORT*  Clinical Data:  Diabetic hypertensive patient with left-sided numbness and weakness.  MRI HEAD WITHOUT CONTRAST  MRA HEAD WITHOUT CONTRAST  Technique: Multiplanar, multiecho pulse sequences of the brain and surrounding structures were obtained according to standard protocol without intravenous contrast.  Angiographic images of the head were obtained using MRA technique without contrast.  Comparison: 05/05/2012 CT.  MRI HEAD  Findings:  Fast technique imaging utilized secondary to claustrophobia.  No acute infarct.  No intracranial hemorrhage.  Mild small vessel disease type changes.  Small perivascular spaces basal ganglia rather than remote lacunar infarcts as questioned on CT.  No hydrocephalus.  No intracranial mass lesion detected on this unenhanced exam.  Complex opacification in the right maxillary sinus.  Mild mucosal thickening right frontal sinus, ethmoid sinus air cells.  Empty sella.  This may be an incidental finding although also described in patients with pseudotumor cerebri.  IMPRESSION: No acute infarct.  Please see above.  MRA HEAD  Findings: Hypoplastic A1 segment of the right anterior cerebral artery.  Narrowed attenuated A2 segment right anterior cerebral artery.  Slightly bulbous appearance anterior communicating artery region without discrete aneurysm noted on this slightly motion degraded exam.  Mild narrowing supraclinoid aspect of the internal carotid artery more  notable on the right.  Mild narrowing M1 segment left middle cerebral artery.  Mild narrowing proximal A1 segment left anterior cerebral artery.  Middle cerebral artery mild to slightly moderate branch vessel irregularity.  Decreased number of visualized right middle cerebral artery branches.  Moderate narrowing right vertebral artery just beyond the takeoff of the right PICA.  Mild irregularity left vertebral artery.  Mild to moderate narrowing of portions of the PICAs.  Mild narrowing and irregularity of the basilar artery. Nonvisualization AICAs  Slightly bulbous appearance of the basilar tip without discrete saccular aneurysm.  Moderate tandem stenoses of the superior cerebellar arteries greater on the right.  Posterior cerebral artery branch vessel irregularity greater on the left.  IMPRESSION: Intracranial atherosclerotic type changes as detailed above most notable involving branch vessel.  Original Report Authenticated By: Fuller Canada, M.D.   Mr Mra Head/brain Wo Cm  05/05/2012  *RADIOLOGY REPORT*  Clinical Data:  Diabetic hypertensive patient with left-sided numbness and weakness.  MRI HEAD WITHOUT CONTRAST MRA HEAD WITHOUT CONTRAST  Technique: Multiplanar, multiecho pulse sequences of the brain and surrounding structures were obtained according to standard protocol without intravenous contrast.  Angiographic images of the head were obtained using MRA technique without contrast.  Comparison: 05/05/2012 CT.  MRI HEAD  Findings:  Fast technique imaging utilized secondary to claustrophobia.  No acute infarct.  No intracranial hemorrhage.  Mild small vessel disease type changes.  Small perivascular spaces basal ganglia rather than remote lacunar infarcts as questioned on CT.  No hydrocephalus.  No intracranial mass lesion detected on this unenhanced exam.  Complex opacification in the right maxillary sinus.  Mild mucosal thickening right frontal sinus, ethmoid sinus air cells.  Empty sella.  This may be  an incidental finding although also described in patients with pseudotumor cerebri.  IMPRESSION: No acute infarct.  Please see above.  MRA HEAD  Findings: Hypoplastic A1 segment of the right anterior cerebral artery.  Narrowed attenuated A2 segment right anterior cerebral artery.  Slightly bulbous appearance anterior communicating artery region without discrete aneurysm noted on this slightly motion degraded exam.  Mild narrowing supraclinoid aspect of the internal carotid artery more notable on the right.  Mild narrowing M1 segment left middle cerebral artery.  Mild narrowing proximal A1 segment left anterior cerebral artery.  Middle cerebral artery mild to slightly moderate branch vessel  irregularity.  Decreased number of visualized right middle cerebral artery branches.  Moderate narrowing right vertebral artery just beyond the takeoff of the right PICA.  Mild irregularity left vertebral artery.  Mild to moderate narrowing of portions of the PICAs.  Mild narrowing and irregularity of the basilar artery. Nonvisualization AICAs  Slightly bulbous appearance of the basilar tip without discrete saccular aneurysm.  Moderate tandem stenoses of the superior cerebellar arteries greater on the right.  Posterior cerebral artery branch vessel irregularity greater on the left.  IMPRESSION: Intracranial atherosclerotic type changes as detailed above most notable involving branch vessel.  Original Report Authenticated By: Fuller Canada, M.D.    Review of Systems: Pertinent items are noted in HPI.  Blood pressure 154/88, pulse 52, temperature 98.2 F (36.8 C), temperature source Oral, resp. rate 18, height 5\' 3"  (1.6 m), weight 89.047 kg (196 lb 5 oz), SpO2 98.00%.  Neurological Exam:  Patient is alert and oriented x 3.  No acute distress.  Fluent speech.  Able to name, repeat, and follow commands.  EOMI, VFFC, PERRL.  Face symmetric with diminished sensation over left face (V1,2,3).  Tongue protrudes midline,  otherwise CN's II-XII intact. Motor: Normal bulk and tone with essentially 5/5 strength throughout (mild 5-/5 weakness on left).  Sensation:  Diminished pinprick and vibration left hemibody (including forehead).  Cerebellar:  Normal finger to nose  No pronator drift.  DTR's 2+ throughout with toes downgoing  Cardiac:RRR with murmur, rub, or gallop Lungs: CTAB Abd: soft, non-tender Skin: no cuts, abrasions, bruises, rashes  Assessment/Plan: 1) Possible TIA  Discussion:  Ms Guadamuz presents a # of vascular risk factors and the acute onset of left sided numbness/weakness and chest pain this AM, along with a headache and near-syncopal sensation.  Neurological exam reveals some diminished sensation over the left hemi-body, with some ? Mild left sided weakness, but is otherwise non-focal.  Despite equivocal Head CT (? Rt frontal lobe infarct), Head MRI and MRA have been essentially unrevealing.  Overall, DDX at this point should include a TIA. Given the negative Head MRI, the possibility of some embellishment should be considered as an explanation of her persistent left sided hemibody numbness.  Alternatively, given the associated severe headache (now improved), the possibility of migraine is also in the differential, but (as noted above) she has not had a significant headache in years.  Recc: 1) agree with Plavix....would add a statin and check baseline fasting lipid panel (target LDL to < 100) 2) agree with plans for Carotid and Echo 3) consider LP if headache persists (she looks very comfortable at present) to further exclude SAH.  She would need to be off Plavix for 5-7 days before this could be performed. 4) PT, OT, ST evals 5) permissive hypertension....current BP is acceptable 6) aggressive long term vascular risk factor modification    Beryle Beams 05/05/2012, 6:59 PM

## 2012-05-06 ENCOUNTER — Encounter (HOSPITAL_COMMUNITY): Payer: Self-pay | Admitting: Physician Assistant

## 2012-05-06 DIAGNOSIS — R001 Bradycardia, unspecified: Secondary | ICD-10-CM | POA: Diagnosis present

## 2012-05-06 DIAGNOSIS — G459 Transient cerebral ischemic attack, unspecified: Secondary | ICD-10-CM

## 2012-05-06 DIAGNOSIS — I6350 Cerebral infarction due to unspecified occlusion or stenosis of unspecified cerebral artery: Secondary | ICD-10-CM

## 2012-05-06 DIAGNOSIS — I10 Essential (primary) hypertension: Secondary | ICD-10-CM

## 2012-05-06 DIAGNOSIS — E1165 Type 2 diabetes mellitus with hyperglycemia: Secondary | ICD-10-CM

## 2012-05-06 DIAGNOSIS — R51 Headache: Secondary | ICD-10-CM

## 2012-05-06 DIAGNOSIS — I059 Rheumatic mitral valve disease, unspecified: Secondary | ICD-10-CM

## 2012-05-06 DIAGNOSIS — R079 Chest pain, unspecified: Secondary | ICD-10-CM

## 2012-05-06 LAB — LIPID PANEL
LDL Cholesterol: 72 mg/dL (ref 0–99)
Triglycerides: 97 mg/dL (ref <150)

## 2012-05-06 LAB — BASIC METABOLIC PANEL
Calcium: 8.7 mg/dL (ref 8.4–10.5)
GFR calc non Af Amer: 63 mL/min — ABNORMAL LOW (ref >90)
Glucose, Bld: 125 mg/dL — ABNORMAL HIGH (ref 70–99)
Sodium: 141 mEq/L (ref 135–145)

## 2012-05-06 LAB — CBC
Hemoglobin: 13.2 g/dL (ref 12.0–15.0)
MCH: 29.9 pg (ref 26.0–34.0)
MCHC: 32.8 g/dL (ref 30.0–36.0)
RDW: 14.5 % (ref 11.5–15.5)

## 2012-05-06 LAB — GLUCOSE, CAPILLARY
Glucose-Capillary: 131 mg/dL — ABNORMAL HIGH (ref 70–99)
Glucose-Capillary: 132 mg/dL — ABNORMAL HIGH (ref 70–99)
Glucose-Capillary: 92 mg/dL (ref 70–99)

## 2012-05-06 LAB — HEMOGLOBIN A1C: Hgb A1c MFr Bld: 7.2 % — ABNORMAL HIGH (ref <5.7)

## 2012-05-06 MED ORDER — REGADENOSON 0.4 MG/5ML IV SOLN
0.4000 mg | Freq: Once | INTRAVENOUS | Status: AC
  Administered 2012-05-07: 0.4 mg via INTRAVENOUS
  Filled 2012-05-06: qty 5

## 2012-05-06 MED ORDER — POTASSIUM CHLORIDE CRYS ER 20 MEQ PO TBCR
40.0000 meq | EXTENDED_RELEASE_TABLET | Freq: Once | ORAL | Status: AC
  Administered 2012-05-06: 40 meq via ORAL
  Filled 2012-05-06: qty 2

## 2012-05-06 NOTE — Progress Notes (Addendum)
Subjective: Patient still having left sided weakness. Reports that her headache is mildly better than yesterday but still persistent. She reports having chest pain, she feels something is grabbing her heart.  Objective: Vital signs in last 24 hours: Filed Vitals:   05/05/12 1400 05/05/12 2014 05/06/12 0330 05/06/12 0547  BP: 154/88 123/69 89/60 108/59  Pulse: 52 57 51 58  Temp: 98.2 F (36.8 C) 97.3 F (36.3 C) 98.8 F (37.1 C) 98.6 F (37 C)  TempSrc: Oral Oral Oral Oral  Resp: 18 18 18 18   Height:      Weight:      SpO2: 98% 98% 96% 92%   Weight change:   Intake/Output Summary (Last 24 hours) at 05/06/12 0807 Last data filed at 05/06/12 0650  Gross per 24 hour  Intake    300 ml  Output   1150 ml  Net   -850 ml    Physical Exam: General: Awake, Oriented, No acute distress. HEENT: EOMI. Neck: Supple CV: S1 and S2 Lungs: Clear to ascultation bilaterally Abdomen: Soft, Nontender, Nondistended, +bowel sounds. Ext: Good pulses. Trace edema. Neuro: Weak on left side.  Lab Results: Basic Metabolic Panel:  Lab 05/05/12 1191  NA 139  K 3.4*  CL 101  CO2 28  GLUCOSE 172*  BUN 18  CREATININE 0.92  CALCIUM 9.5  MG --  PHOS --   Liver Function Tests:  Lab 05/05/12 0732  AST 14  ALT 11  ALKPHOS 80  BILITOT 0.4  PROT 7.0  ALBUMIN 3.3*   No results found for this basename: LIPASE:5,AMYLASE:5 in the last 168 hours No results found for this basename: AMMONIA:5 in the last 168 hours CBC:  Lab 05/06/12 0645 05/05/12 0732  WBC 5.1 4.5  NEUTROABS -- 1.9  HGB 13.2 13.8  HCT 40.2 41.1  MCV 91.0 89.5  PLT 239 281   Cardiac Enzymes:  Lab 05/05/12 1527 05/05/12 1126 05/05/12 0732  CKTOTAL 86 93 116  CKMB 2.2 2.0 1.6  CKMBINDEX -- -- --  TROPONINI <0.30 <0.30 <0.30   BNP (last 3 results) No results found for this basename: PROBNP:3 in the last 8760 hours CBG:  Lab 05/06/12 0644 05/05/12 2141 05/05/12 1639 05/05/12 1138  GLUCAP 124* 131* 141* 134*   No  results found for this basename: HGBA1C:5 in the last 72 hours Other Labs: No components found with this basename: POCBNP:3 No results found for this basename: DDIMER:2 in the last 168 hours No results found for this basename: CHOL:2,HDL:2,LDLCALC:2,TRIG:2,CHOLHDL:2,LDLDIRECT:2 in the last 168 hours No results found for this basename: TSH,T4TOTAL,FREET3,T3FREE,FREET4,THYROIDAB in the last 168 hours No results found for this basename: VITAMINB12:2,FOLATE:2,FERRITIN:2,TIBC:2,IRON:2,RETICCTPCT:2 in the last 168 hours  Micro Results: No results found for this or any previous visit (from the past 240 hour(s)).  Studies/Results: Dg Chest 2 View  05/05/2012  *RADIOLOGY REPORT*  Clinical Data: Left-sided chest pain, weakness and shortness of breath.  CHEST - 2 VIEW  Comparison: Chest x-ray 07/11/2011.  Findings: Lung volumes are normal.  No consolidative airspace disease.  No pleural effusions.  Pulmonary vasculature is normal. Heart size is mildly enlarged (unchanged).  Marked tortuosity of the thoracic aorta.  Surgical clips project over the right upper quadrant of the abdomen, likely from prior cholecystectomy.  IMPRESSION: 1.  No radiographic evidence of acute cardiopulmonary disease. 2.  Mild cardiomegaly is unchanged. 3.  Marked tortuosity of the thoracic aorta. 4.  Status post cholecystectomy.  Original Report Authenticated By: Florencia Reasons, M.D.   Ct Head Wo  Contrast  05/05/2012  *RADIOLOGY REPORT*  Clinical Data: Left arm tingling and weakness.  Diabetic and hypertensive patient.  CT HEAD WITHOUT CONTRAST  Technique:  Contiguous axial images were obtained from the base of the skull through the vertex without contrast.  Comparison: 07/08/2011.  Findings: No intracranial hemorrhage. No intracranial mass lesion detected on this unenhanced exam.  No hydrocephalus.  Mild atrophy.  Remote right external capsule infarct.  Questionable subtle acute right frontal lobe infarct?  Tortuous basilar artery  unchanged.  Partially empty sella stable. Prominent ossification of the falx greater to the right once again noted.  Partial opacification ethmoid sinus air cells.  Moderate mucosal thickening with complex appearance right maxillary sinus suggesting chronic changes with adjacent bone thickening.  IMPRESSION: Question small acute right frontal lobe infarct without associated hemorrhage. Evaluation for posterior fossa stroke limited by streak artifact.  Maxillary sinus mucosal thickening and partial opacification ethmoid sinus air cells.  Please see above.  Results discussed with Dr. Rubin Payor 05/05/2012 8:11 a.m.  Original Report Authenticated By: Fuller Canada, M.D.   Mr Brain Wo Contrast  05/05/2012  *RADIOLOGY REPORT*  Clinical Data:  Diabetic hypertensive patient with left-sided numbness and weakness.  MRI HEAD WITHOUT CONTRAST MRA HEAD WITHOUT CONTRAST  Technique: Multiplanar, multiecho pulse sequences of the brain and surrounding structures were obtained according to standard protocol without intravenous contrast.  Angiographic images of the head were obtained using MRA technique without contrast.  Comparison: 05/05/2012 CT.  MRI HEAD  Findings:  Fast technique imaging utilized secondary to claustrophobia.  No acute infarct.  No intracranial hemorrhage.  Mild small vessel disease type changes.  Small perivascular spaces basal ganglia rather than remote lacunar infarcts as questioned on CT.  No hydrocephalus.  No intracranial mass lesion detected on this unenhanced exam.  Complex opacification in the right maxillary sinus.  Mild mucosal thickening right frontal sinus, ethmoid sinus air cells.  Empty sella.  This may be an incidental finding although also described in patients with pseudotumor cerebri.  IMPRESSION: No acute infarct.  Please see above.  MRA HEAD  Findings: Hypoplastic A1 segment of the right anterior cerebral artery.  Narrowed attenuated A2 segment right anterior cerebral artery.  Slightly  bulbous appearance anterior communicating artery region without discrete aneurysm noted on this slightly motion degraded exam.  Mild narrowing supraclinoid aspect of the internal carotid artery more notable on the right.  Mild narrowing M1 segment left middle cerebral artery.  Mild narrowing proximal A1 segment left anterior cerebral artery.  Middle cerebral artery mild to slightly moderate branch vessel irregularity.  Decreased number of visualized right middle cerebral artery branches.  Moderate narrowing right vertebral artery just beyond the takeoff of the right PICA.  Mild irregularity left vertebral artery.  Mild to moderate narrowing of portions of the PICAs.  Mild narrowing and irregularity of the basilar artery. Nonvisualization AICAs  Slightly bulbous appearance of the basilar tip without discrete saccular aneurysm.  Moderate tandem stenoses of the superior cerebellar arteries greater on the right.  Posterior cerebral artery branch vessel irregularity greater on the left.  IMPRESSION: Intracranial atherosclerotic type changes as detailed above most notable involving branch vessel.  Original Report Authenticated By: Fuller Canada, M.D.   Mr Mra Head/brain Wo Cm  05/05/2012  *RADIOLOGY REPORT*  Clinical Data:  Diabetic hypertensive patient with left-sided numbness and weakness.  MRI HEAD WITHOUT CONTRAST MRA HEAD WITHOUT CONTRAST  Technique: Multiplanar, multiecho pulse sequences of the brain and surrounding structures were obtained according to  standard protocol without intravenous contrast.  Angiographic images of the head were obtained using MRA technique without contrast.  Comparison: 05/05/2012 CT.  MRI HEAD  Findings:  Fast technique imaging utilized secondary to claustrophobia.  No acute infarct.  No intracranial hemorrhage.  Mild small vessel disease type changes.  Small perivascular spaces basal ganglia rather than remote lacunar infarcts as questioned on CT.  No hydrocephalus.  No intracranial  mass lesion detected on this unenhanced exam.  Complex opacification in the right maxillary sinus.  Mild mucosal thickening right frontal sinus, ethmoid sinus air cells.  Empty sella.  This may be an incidental finding although also described in patients with pseudotumor cerebri.  IMPRESSION: No acute infarct.  Please see above.  MRA HEAD  Findings: Hypoplastic A1 segment of the right anterior cerebral artery.  Narrowed attenuated A2 segment right anterior cerebral artery.  Slightly bulbous appearance anterior communicating artery region without discrete aneurysm noted on this slightly motion degraded exam.  Mild narrowing supraclinoid aspect of the internal carotid artery more notable on the right.  Mild narrowing M1 segment left middle cerebral artery.  Mild narrowing proximal A1 segment left anterior cerebral artery.  Middle cerebral artery mild to slightly moderate branch vessel irregularity.  Decreased number of visualized right middle cerebral artery branches.  Moderate narrowing right vertebral artery just beyond the takeoff of the right PICA.  Mild irregularity left vertebral artery.  Mild to moderate narrowing of portions of the PICAs.  Mild narrowing and irregularity of the basilar artery. Nonvisualization AICAs  Slightly bulbous appearance of the basilar tip without discrete saccular aneurysm.  Moderate tandem stenoses of the superior cerebellar arteries greater on the right.  Posterior cerebral artery branch vessel irregularity greater on the left.  IMPRESSION: Intracranial atherosclerotic type changes as detailed above most notable involving branch vessel.  Original Report Authenticated By: Fuller Canada, M.D.    Medications: I have reviewed the patient's current medications. Scheduled Meds:   . atenolol  12.5 mg Oral Daily  . cholecalciferol  1,000 Units Oral Daily  . clopidogrel  75 mg Oral Q breakfast  . conjugated estrogens  1 g Vaginal 2 times weekly  . enoxaparin  40 mg Subcutaneous  Q24H  . glipiZIDE  5 mg Oral Daily  . insulin aspart  0-9 Units Subcutaneous TID WC  . pantoprazole  40 mg Oral Daily  . potassium chloride  20 mEq Oral Once   Continuous Infusions:   . sodium chloride 75 mL/hr at 05/05/12 2015   PRN Meds:.acetaminophen, acetaminophen, butalbital-acetaminophen-caffeine, hydrALAZINE, ibuprofen, ondansetron (ZOFRAN) IV, senna-docusate  Assessment/Plan: Left-sided weakness and numbness due to possible TIA.  CT of the head on 05/05/2012 showed question small acute right frontal lobe infarct without associated hemorrhage, however MRI MRA of the head on 05/05/2012 showed no acute infarct, it showed arthrosclerotic changes.  Neurology consulted, appreciate their input.  Continue Plavix, aspirin discontinued.  Carotid Dopplers shows no significant ICA stenosis bilaterally.  2-D echocardiogram pending.  LDL 72.    Chest pain  EKG shows sinus rhythm. 2-D echocardiogram pending.  Troponin negative x3.  Given persistent pain and given patient has risk factors for coronary disease, will request cardiology evaluation.    Type 2 diabetes uncontrolled with complications  Continue glipizide. Sensitive sliding scale insulin. Hemoglobin A1c pending.   Hypertension  Initially allowed for permissive hypertension.  Patient is 24 hours past initial onset of symptoms.  Blood pressure improved, discontinued hydrochlorothiazide.  Continue atenolol however dose is decreased, patient is mildly  bradycardic.  Bradycardia Continue to monitor.  Continue atenolol at low dose.  GERD with intermittent right lower quadrant abdominal pain.  Continue PPI.   Obesity  Diet and exercise as outpatient.   Hypokalemia Replace as needed.  Prophylaxis  Lovenox.   CODE STATUS  Full code.  Disposition Pending PT/OT, cardiology, and neurology evaluation.   LOS: 1 day  Loise Esguerra A, MD 05/06/2012, 8:07 AM

## 2012-05-06 NOTE — Progress Notes (Signed)
  Echocardiogram 2D Echocardiogram has been performed.  Currie Dennin L 05/06/2012, 12:04 PM

## 2012-05-06 NOTE — Consult Note (Signed)
CARDIOLOGY CONSULT NOTE   Patient ID: [name] [name] MRN: [id number] DOB/AGE: 67/05/1946 67 y.o.  Admit date: 05/05/2012  Primary Physician   Florentina Jenny, MD Primary Cardiologist   [facility] (saw her in consult 2009) Reason for Consultation   Chest pain  HYQ:[id number] [name] [name] is a 66 y.o. female with no history of CAD.  She gets occasional episodes of chest pain with exertion. The exertion is usually upper-body, such as lifting a patient. It resolves without intervention, generally in < 15 minutes. She has chronic DOE that may have gotten a little worse recently. No significant edema or other illness.  Yesterday, she was getting ready for work and had onset of sharp SSCP that went into her left shoulder. It was associated with SOB. She got a little light-headed. The pain was 10/10 and worse with deep inspiration. She then developed numbness that started in the left side of her face and went to her left arm. When her symptoms did not resolve, she came to the hospital. She does not remember getting any medication for the chest pain, but it resolved and has not recurred. Currently she has a severe HA and is getting pain meds for that.  Past Medical History  Diagnosis Date  . Hypertension   . Diabetes mellitus   . Obesity (BMI 35.0-39.9 without comorbidity)   . GERD (gastroesophageal reflux disease)   . Chest pain 2009    MV with no scar or ischemia, EF 65%    Past Surgical History  Procedure Date  . Back surgery   . Wisdom tooth extraction   . Cholecystectomy   . Temporomandibular joint surgery   . Abdominal hysterectomy 2006    Partial, one ovary left   . Cardiac catheterization 2004    [place] per pt.    Allergies  Allergen Reactions  . Codeine Nausea And Vomiting    I have reviewed the patient's current medications   . atenolol  12.5 mg Oral Daily  . cholecalciferol  1,000 Units Oral Daily  . clopidogrel  75 mg Oral Q breakfast  . conjugated estrogens  1 g  Vaginal 2 times weekly  . enoxaparin  40 mg Subcutaneous Q24H  . glipiZIDE  5 mg Oral Daily  . insulin aspart  0-9 Units Subcutaneous TID WC  . pantoprazole  40 mg Oral Daily  . potassium chloride  20 mEq Oral Once  . potassium chloride  40 mEq Oral Once      . sodium chloride 75 mL/hr at 05/05/12 2015   acetaminophen, acetaminophen, butalbital-acetaminophen-caffeine, hydrALAZINE, ibuprofen, ondansetron (ZOFRAN) IV, senna-docusate  Medication Sig  aspirin 81 MG tablet Take 81 mg by mouth daily.  atenolol (TENORMIN) 25 MG tablet Take 25 mg by mouth daily.  cholecalciferol (VITAMIN D) 1000 UNITS tablet Take 1,000 Units by mouth daily.  conjugated estrogens (PREMARIN) vaginal cream Place 1 g vaginally 2 (two) times a week.  glipiZIDE (GLUCOTROL) 5 MG tablet Take 5 mg by mouth daily.   hydrochlorothiazide (HYDRODIURIL) 50 MG tablet Take 50 mg by mouth daily.  Lansoprazole (PREVACID PO) Take 30 mg by mouth.    History   Social History  . Marital Status: Married    Spouse Name: N/A    Number of Children: N/A  . Years of Education: N/A   Occupational History  . Caregiver for the elderly   Social History Main Topics  . Smoking status: Former Smoker -- 1.0 packs/day for .5 years    Types: Cigarettes  Quit date: 05/05/1969  . Smokeless tobacco: Never Used  . Alcohol Use: No  . Drug Use: No  . Sexually Active: No   Social History Narrative   Family History Details: Her mother died at age 39 of pancreatitis. Her father died of mitral valve disease at age 72. She has a brother who has had myocardial infarction and also has had bypass surgery and a sister who has a pacemaker.   Family History  Problem Relation Age of Onset  . Heart disease Father   . Diabetes Maternal Grandmother   . Pancreatitis Mother      ROS: She has poor exercise tolerance chronically. She feels her balance is poor and since she fell, going up some steps, she tries not to do steps. She has balance  problems and does not walk that much but will still mow the lawn with a push-mower and do gardening/house work. She does better using something for balance like a grocery cart. She has not had fevers or chills. She has MS aches/pains. Full 14 point review of systems complete and found to be negative unless listed above.  Physical Exam: Blood pressure 108/59, pulse 58, temperature 98.6 F (37 C), temperature source Oral, resp. rate 18, height 5\' 3"  (1.6 m), weight 196 lb 5 oz (89.047 kg), SpO2 92.00%.  General: Well developed, well nourished, elderly female in no acute distress Head: Eyes PERRLA, No xanthomas.   Normocephalic and atraumatic, oropharynx without edema or exudate. Dentition OK Lungs: Good air exchange bilaterally, few rales bases Heart: HRRR S1 S2, no rub/gallop, no murmur. pulses are 2+ all extrem.   Neck: No carotid bruits. No lymphadenopathy.  JVD not elevated. Abdomen: Bowel sounds present, abdomen soft and non-tender without masses or hernias noted. Msk:  No spine or cva tenderness. No weakness, no joint deformities or effusions. Extremities: No clubbing or cyanosis. no edema.  Neuro: Alert and oriented X 3. No focal deficits noted. Psych:  Good affect, responds appropriately Skin: No rashes or lesions noted.  Labs:   Lab Results  Component Value Date   WBC 5.1 05/06/2012   HGB 13.2 05/06/2012   HCT 40.2 05/06/2012   MCV 91.0 05/06/2012   PLT 239 05/06/2012    Basename 05/05/12 0732  INR 1.04    Lab 05/06/12 0645 05/05/12 0732  NA 141 --  K 3.1* --  CL 105 --  CO2 25 --  BUN 15 --  CREATININE 0.93 --  CALCIUM 8.7 --  PROT -- 7.0  BILITOT -- 0.4  ALKPHOS -- 80  ALT -- 11  AST -- 14  GLUCOSE 125* --    Basename 05/05/12 1527 05/05/12 1126 05/05/12 0732  CKTOTAL 86 93 116  CKMB 2.2 2.0 1.6  TROPONINI <0.30 <0.30 <0.30   Lab Results  Component Value Date   CHOL 129 05/06/2012   HDL 38* 05/06/2012   LDLCALC 72 05/06/2012   TRIG 97 05/06/2012   Echo:  01/06/2008 SUMMARY - Overall left ventricular systolic function was normal. Left ventricular ejection fraction was estimated to be 60 %. There were no left ventricular regional wall motion abnormalities. - There was mild mitral valvular regurgitation. - Right ventricular size was at the upper limits of normal. Right ventricular systolic function was mildly reduced.  Myoview: 01/10/2008 IMPRESSION: 1. No evidence of reversible ischemia or infarction. 2. Normal wall motion. 3. Left ventricular ejection fraction is equal to 65%.  ECG: 05-May-2012 06:59:20  SINUS RHYTHM ~ normal P axis, V-rate 50- 99 NONSPECIFIC  IVCD WITH LAD ~ QRSd >191mS & LAD since last tracing no significant change Abnormal ECG 34mm/s 22mm/mV 150Hz  8.0.1 12SL 235 CID: [id number] Referred by: Confirmed By: Mancel Bale MD Vent. rate 51 BPM PR interval 156 ms QRS duration 112 ms QT/QTc 496/457 ms P-R-T axes 13 -41 -24   Radiology:  Dg Chest 2 View 05/05/2012  *RADIOLOGY REPORT*  Clinical Data: Left-sided chest pain, weakness and shortness of breath.  CHEST - 2 VIEW  Comparison: Chest x-ray 07/11/2011.  Findings: Lung volumes are normal.  No consolidative airspace disease.  No pleural effusions.  Pulmonary vasculature is normal. Heart size is mildly enlarged (unchanged).  Marked tortuosity of the thoracic aorta.  Surgical clips project over the right upper quadrant of the abdomen, likely from prior cholecystectomy.  IMPRESSION: 1.  No radiographic evidence of acute cardiopulmonary disease. 2.  Mild cardiomegaly is unchanged. 3.  Marked tortuosity of the thoracic aorta. 4.  Status post cholecystectomy.  Original Report Authenticated By: [name] [name], M.D.   Ct Head Wo Contrast 05/05/2012  *RADIOLOGY REPORT*  Clinical Data: Left arm tingling and weakness.  Diabetic and hypertensive patient.  CT HEAD WITHOUT CONTRAST  Technique:  Contiguous axial images were obtained from the base of the skull through the vertex without  contrast.  Comparison: 07/08/2011.  Findings: No intracranial hemorrhage. No intracranial mass lesion detected on this unenhanced exam.  No hydrocephalus.  Mild atrophy.  Remote right external capsule infarct.  Questionable subtle acute right frontal lobe infarct?  Tortuous basilar artery unchanged.  Partially empty sella stable. Prominent ossification of the falx greater to the right once again noted.  Partial opacification ethmoid sinus air cells.  Moderate mucosal thickening with complex appearance right maxillary sinus suggesting chronic changes with adjacent bone thickening.  IMPRESSION: Question small acute right frontal lobe infarct without associated hemorrhage. Evaluation for posterior fossa stroke limited by streak artifact.  Maxillary sinus mucosal thickening and partial opacification ethmoid sinus air cells.  Please see above.  Results discussed with Dr. Rubin Payor 05/05/2012 8:11 a.m.  Original Report Authenticated By: Fuller Canada, M.D.   Mr Mra Head/brain Wo Cm 05/05/2012  *RADIOLOGY REPORT*  Clinical Data:  Diabetic hypertensive patient with left-sided numbness and weakness.  MRI HEAD WITHOUT CONTRAST MRA HEAD WITHOUT CONTRAST  Technique: Multiplanar, multiecho pulse sequences of the brain and surrounding structures were obtained according to standard protocol without intravenous contrast.  Angiographic images of the head were obtained using MRA technique without contrast.  Comparison: 05/05/2012 CT.  MRI HEAD  Findings:  Fast technique imaging utilized secondary to claustrophobia.  No acute infarct.  No intracranial hemorrhage.  Mild small vessel disease type changes.  Small perivascular spaces basal ganglia rather than remote lacunar infarcts as questioned on CT.  No hydrocephalus.  No intracranial mass lesion detected on this unenhanced exam.  Complex opacification in the right maxillary sinus.  Mild mucosal thickening right frontal sinus, ethmoid sinus air cells.  Empty sella.  This may be an  incidental finding although also described in patients with pseudotumor cerebri.  IMPRESSION: No acute infarct.  Please see above.    MRA HEAD  Findings: Hypoplastic A1 segment of the right anterior cerebral artery.  Narrowed attenuated A2 segment right anterior cerebral artery.  Slightly bulbous appearance anterior communicating artery region without discrete aneurysm noted on this slightly motion degraded exam.  Mild narrowing supraclinoid aspect of the internal carotid artery more notable on the right.  Mild narrowing M1 segment left middle cerebral artery.  Mild  narrowing proximal A1 segment left anterior cerebral artery.  Middle cerebral artery mild to slightly moderate branch vessel irregularity.  Decreased number of visualized right middle cerebral artery branches.  Moderate narrowing right vertebral artery just beyond the takeoff of the right PICA.  Mild irregularity left vertebral artery.  Mild to moderate narrowing of portions of the PICAs.  Mild narrowing and irregularity of the basilar artery. Nonvisualization AICAs  Slightly bulbous appearance of the basilar tip without discrete saccular aneurysm.  Moderate tandem stenoses of the superior cerebellar arteries greater on the right.  Posterior cerebral artery branch vessel irregularity greater on the left.  IMPRESSION: Intracranial atherosclerotic type changes as detailed above most notable involving branch vessel.  Original Report Authenticated By: Fuller Canada, M.D.    ASSESSMENT AND PLAN:   The patient was seen today by Dr Eden Emms, the patient evaluated and the data reviewed.   Chest pain - atypical and typical symptoms. Enzymes are negative for MI, called Vascular lab and they will do echo this am. Cath if abnormal, MV if EF OK. Will go ahead and order MV, f/u on echo results later today.  Otherwise, per IM, Neuro -  Principal Problem:  *CVA (cerebral vascular accident) Active Problems:  Diabetes mellitus, type 2  Hypertension  gerd   Obesity (BMI 35.0-39.9 without comorbidity)  Acute left-sided weakness  Chest pain  Bradycardia  Hypokalemia - supp ordered, consider recheck in am   Signed: Theodore Demark 05/06/2012, 8:59 AM Co-Sign MD  Patient examined chart reviewed.  Plan of care discussed with patient and PA Atypical symptoms.  On ASA and Plavix.  MRI with no documented acute CVA Plan for stress myovue in am.    Charlton Haws

## 2012-05-06 NOTE — Evaluation (Signed)
Occupational Therapy Evaluation Patient Details Name: Minnesota Brooking MRN: [id number] DOB: August 24, 1946 Today's Date: 05/06/2012 Time: 4098-1191 OT Time Calculation (min): 27 min  OT Assessment / Plan / Recommendation Clinical Impression  Pt admitted with left-sided weakness- acute stroke indicated on CT scan but subsequent MRI clear. Pt presents with back pain, left-sided weakness and overall decreased independence with ADL. Will benefit from skilled OT in the acute setting to maximize I with ADL and ADL mobility.     OT Assessment  Patient needs continued OT Services    Follow Up Recommendations  Skilled nursing facility    Barriers to Discharge Decreased caregiver support    Equipment Recommendations  Defer to next venue    Recommendations for Other Services    Frequency  Min 2X/week    Precautions / Restrictions Precautions Precautions: Fall Restrictions Weight Bearing Restrictions: No   Pertinent Vitals/Pain Pt reports low back pain, but did not rate. Repositioned    ADL  Lower Body Dressing: Performed;Min guard (doff/don socks) Where Assessed - Lower Body Dressing: Unsupported sitting Toilet Transfer: Simulated;Moderate assistance Toilet Transfer Method: Sit to Barista:  (to/from bed) Equipment Used: Gait belt;Rolling walker Transfers/Ambulation Related to ADLs: Pt requiring +2 pt=60% for ambulation; with Rt knee buckling initially- pt attributing this to back pain as opposed to LE weakness    OT Diagnosis: Generalized weakness;Acute pain  OT Problem List: Decreased strength;Decreased range of motion;Decreased activity tolerance;Impaired balance (sitting and/or standing);Decreased knowledge of use of DME or AE;Decreased knowledge of precautions;Pain;Impaired UE functional use;Impaired sensation OT Treatment Interventions: Self-care/ADL training;DME and/or AE instruction;Therapeutic activities;Patient/family education   OT Goals Acute Rehab OT  Goals OT Goal Formulation: With patient Time For Goal Achievement: 05/20/12 Potential to Achieve Goals: Good ADL Goals Pt Will Perform Grooming: with supervision;Standing at sink ADL Goal: Grooming - Progress: Goal set today Pt Will Perform Upper Body Dressing: with modified independence;Sitting, bed;Sitting, chair ADL Goal: Upper Body Dressing - Progress: Goal set today Pt Will Perform Lower Body Dressing: with supervision;Sit to stand from bed;Sit to stand from chair ADL Goal: Lower Body Dressing - Progress: Goal set today Pt Will Transfer to Toilet: with modified independence;Ambulation;with DME;3-in-1 ADL Goal: Toilet Transfer - Progress: Goal set today Pt Will Perform Toileting - Clothing Manipulation: with modified independence;Standing ADL Goal: Toileting - Clothing Manipulation - Progress: Goal set today Pt Will Perform Tub/Shower Transfer: Tub transfer;Ambulation;with DME;with min assist ADL Goal: Tub/Shower Transfer - Progress: Goal set today Additional ADL Goal #1: Pt will demonstrate I with LUE strengthening HEP ADL Goal: Additional Goal #1 - Progress: Goal set today  Visit Information  Last OT Received On: 05/06/12 PT/OT Co-Evaluation/Treatment: Yes    Subjective Data  Patient Stated Goal: Return home and to gardening   Prior Functioning  Home Living Lives With: Son Available Help at Discharge: Family Type of Home: House Home Access: Level entry Home Layout: One level Bathroom Shower/Tub: Engineer, manufacturing systems: Standard Home Adaptive Equipment: Grab bars in shower Prior Function Level of Independence: Independent Able to Take Stairs?:  (needs to due step to pattern) Driving: Yes Comments: gardening Communication Communication: No difficulties Dominant Hand: Right    Cognition  Overall Cognitive Status: Appears within functional limits for tasks assessed/performed Arousal/Alertness: Awake/alert Orientation Level: Appears intact for tasks  assessed Behavior During Session: [facility] for tasks performed    Extremity/Trunk Assessment Right Upper Extremity Assessment RUE ROM/Strength/Tone: Within functional levels RUE Sensation: WFL - Light Touch RUE Coordination: WFL - gross/fine motor Left Upper  Extremity Assessment LUE ROM/Strength/Tone: Deficits LUE ROM/Strength/Tone Deficits: shoulder flexion4/5 and extension 5/5; elbow WFL; wrist 4-/5; digits 3+/5  LUE Sensation: Deficits LUE Sensation Deficits: pt reports "dull" feeling on LUE LUE Coordination: Deficits LUE Coordination Deficits: fine motor deficits   Mobility Bed Mobility Bed Mobility: Not assessed Transfers Transfers: Sit to Stand;Stand to Sit Sit to Stand: 3: Mod assist;From bed;With upper extremity assist Stand to Sit: 4: Min assist;With upper extremity assist;To bed Details for Transfer Assistance: Pt with VC for hand placement and need for blocking of Rt knee secondary to buckling   Exercise    Balance    End of Session OT - End of Session Equipment Utilized During Treatment: Gait belt Activity Tolerance: Patient limited by pain;Patient limited by fatigue Patient left: in bed;with call bell/phone within reach   Aashi Derrington 05/06/2012, 1:31 PM

## 2012-05-06 NOTE — Progress Notes (Signed)
Stroke Team Progress Note  HISTORY  66 y.o. African American female with history of hypertension, diabetes, obesity, GERD, CVA in 57 ([place], [place] area) who is seen for possible TIA vs stroke. Hx per pt (fair historian). Patient noted that over the last week she has been feeling "bad," (nausea). She woke up at [facility] for work and note left-sided numbness, weakness/heaviness, and chest pain and felt like she could not catch her breath. She also had some trouble getting out her words and felt near-syncopal. Initially no headache, but then developed a "10/10" headache (now a 7/10). She notes last significant headache was "years" ago. EMS summoned and in ER had Head CT noted to have question of small acute right frontal lobe infarct without hemorrhage. Since admission she notes she now feels better, but has not yet gotten out of bed. Denies diplopia or right sided complaints. Does note a similar event in 1986 ([place], [place]) and was told she had a "TIA" at the time and placed on aspirin.   Patient was not a TPA candidate secondary to the fact that she woke up with these symptoms.    SUBJECTIVE  Overall she feels her condition is gradually improving. She has had no further symptoms except had a "mild" headache this am which was relieved by pain medication.she describes the headache as being the worst she's had a week ago it improved after an hour but has not completely gone away. She does have history of migraine headaches in the remote past but has not had any active migraines in recent years he  OBJECTIVE Most recent Vital Signs: Filed Vitals:   05/05/12 1400 05/05/12 2014 05/06/12 0330 05/06/12 0547  BP: 154/88 123/69 89/60 108/59  Pulse: 52 57 51 58  Temp: 98.2 F (36.8 C) 97.3 F (36.3 C) 98.8 F (37.1 C) 98.6 F (37 C)  TempSrc: Oral Oral Oral Oral  Resp: 18 18 18 18   Height:      Weight:      SpO2: 98% 98% 96% 92%   CBG (last 3)   Basename 05/06/12 0644 05/05/12 2141 05/05/12 1639    GLUCAP 124* 131* 141*   Intake/Output from previous day: 06/16 0701 - 06/17 0700 In: 300 [I.V.:300] Out: 1150 [Urine:1150]  IV Fluid Intake:     . sodium chloride 75 mL/hr at 05/05/12 2015    MEDICATIONS    . atenolol  12.5 mg Oral Daily  . cholecalciferol  1,000 Units Oral Daily  . clopidogrel  75 mg Oral Q breakfast  . conjugated estrogens  1 g Vaginal 2 times weekly  . enoxaparin  40 mg Subcutaneous Q24H  . glipiZIDE  5 mg Oral Daily  . insulin aspart  0-9 Units Subcutaneous TID WC  . pantoprazole  40 mg Oral Daily  . potassium chloride  40 mEq Oral Once   PRN:  acetaminophen, acetaminophen, butalbital-acetaminophen-caffeine, hydrALAZINE, ibuprofen, ondansetron (ZOFRAN) IV, senna-docusate  Diet:  Carb Control thin liquids Activity:  OOB to chair DVT Prophylaxis:  lovenox  CLINICALLY SIGNIFICANT STUDIES Basic Metabolic Panel:  Lab 05/06/12 4540 05/05/12 0732  NA 141 139  K 3.1* 3.4*  CL 105 101  CO2 25 28  GLUCOSE 125* 172*  BUN 15 18  CREATININE 0.93 0.92  CALCIUM 8.7 9.5  MG -- --  PHOS -- --   Liver Function Tests:  Lab 05/05/12 0732  AST 14  ALT 11  ALKPHOS 80  BILITOT 0.4  PROT 7.0  ALBUMIN 3.3*   CBC:  Lab  05/06/12 0645 05/05/12 0732  WBC 5.1 4.5  NEUTROABS -- 1.9  HGB 13.2 13.8  HCT 40.2 41.1  MCV 91.0 89.5  PLT 239 281   Coagulation:  Lab 05/05/12 0732  LABPROT 13.8  INR 1.04   Cardiac Enzymes:  Lab 05/05/12 1527 05/05/12 1126 05/05/12 0732  CKTOTAL 86 93 116  CKMB 2.2 2.0 1.6  CKMBINDEX -- -- --  TROPONINI <0.30 <0.30 <0.30   Urinalysis:  Lab 05/05/12 1107  COLORURINE YELLOW  LABSPEC 1.009  PHURINE 7.5  GLUCOSEU NEGATIVE  HGBUR NEGATIVE  BILIRUBINUR NEGATIVE  KETONESUR NEGATIVE  PROTEINUR NEGATIVE  UROBILINOGEN 0.2  NITRITE NEGATIVE  LEUKOCYTESUR NEGATIVE   Lipid Panel    Component Value Date/Time   CHOL 129 05/06/2012 0645   TRIG 97 05/06/2012 0645   HDL 38* 05/06/2012 0645   CHOLHDL 3.4 05/06/2012 0645   VLDL 19  05/06/2012 0645   LDLCALC 72 05/06/2012 0645   HgbA1C  Lab Results  Component Value Date   HGBA1C  Value: 6.3 (NOTE)   The ADA recommends the following therapeutic goals for glycemic   control related to Hgb A1C measurement:   Goal of Therapy:   < 7.0% Hgb A1C   Action Suggested:  > 8.0% Hgb A1C   Ref:  Diabetes Care, 22, Suppl. 1, 1999* 01/06/2008    Urine Drug Screen:   No results found for this basename: labopia, cocainscrnur, labbenz, amphetmu, thcu, labbarb    Alcohol Level: No results found for this basename: ETH:2 in the last 168 hours  Dg Chest 2 View  05/05/2012    No radiographic evidence of acute cardiopulmonary disease. 2.  Mild cardiomegaly is unchanged. 3.  Marked tortuosity of the thoracic aorta. 4.  Status post cholecystectomy.    Ct Head Wo Contrast  05/05/2012  Remote right external capsule infarct.   Question small acute right frontal lobe infarct without associated hemorrhage. Evaluation for posterior fossa stroke limited by streak artifact.   Mr Brain   05/05/12 Intracranial atherosclerotic type changes as detailed above most notable involving branch vessel.   Mild small vessel disease type changes.  Small perivascular spaces basal ganglia rather than remote lacunar infarcts: Intracranial atherosclerotic type changes as detailed above most notable involving branch vessel.    CT of the brain  Question small acute right frontal lobe infarct without associated hemorrhage. Evaluation for posterior fossa stroke limited by streak artifact.  Echocardiogram  pending  Carotid Doppler  No ICA stenosis   EKG  normal EKG, normal sinus rhythm, unchanged from previous tracings, sinus bradycardia.   Therapy Recommendations PT --- OT ---  Physical Exam  ---pleasant middle-aged African American lady not in distress.Awake alert. Afebrile. Head is nontraumatic. Neck is supple without bruit. Hearing is normal. Cardiac exam no murmur or gallop. Lungs are clear to auscultation. Distal  pulses are well felt.  Neurological exam  :Awake  Alert oriented x 3. Normal speech and language.eye movements full without nystagmus. Face symmetric. Tongue midline. Normal strength, tone, reflexes and coordination.left hemibody sensation with splitting the midline including forehead.. Gait deferred.  ASSESSMENT [name] [name] is a 66 y.o. female with a left sided weakness, stroke workup in progress. On aspirin 81 mg orally every day prior to admission. Now on clopidogrel 75 mg orally every day for secondary stroke prevention.  -Left sided weakness secondary to non-organic etiology, workup in progress. -HTN -Diabetes Mellitus -Obesity -chest pain   Hospital day # 1  TREATMENT/PLAN  -Add clopidogrel 75 mg orally  every day for secondary stroke prevention. -Echo study pending -Cardiac myoview in am -aggressive risk factor management  Guy Franco, PAC,  MBA, MHA [facility] Pager: [phone] 05/06/2012 10:23 AM  Scribe for Dr. Delia Heady, Stroke Center Medical Director. He has personally reviewed chart, pertinent data, examined the patient and developed the plan of care. Pager:  [phone]

## 2012-05-06 NOTE — Care Management Note (Signed)
    Page 1 of 2   05/08/2012     9:55:55 AM   CARE MANAGEMENT NOTE 05/08/2012  Patient:  [name] [name]   Account Number:  [id number]  Date Initiated:  05/06/2012  Documentation initiated by:  Onnie Boer  Subjective/Objective Assessment:   PT WAS ADMITTED WITH WEAKNESS     Action/Plan:   PROGRESSION OF CARE AND DISCHARGE PLANNING   Anticipated DC Date:  05/08/2012   Anticipated DC Plan:  HOME W HOME HEALTH SERVICES      DC Planning Services  CM consult      Choice offered to / List presented to:  C-1 Patient   DME arranged  Levan Hurst      DME agency  [facility]     HH arranged  HH-2 PT  HH-3 OT      [facility] [place] [facility] agency  [facility]   Status of service:  Completed, signed off Medicare Important Message given?   (If response is "NO", the following Medicare IM given date fields will be blank) Date Medicare IM given:   Date Additional Medicare IM given:    Discharge Disposition:  HOME W HOME HEALTH SERVICES  Per UR Regulation:  Reviewed for med. necessity/level of care/duration of stay  If discussed at Long Length of Stay Meetings, dates discussed:    Comments:  05/07/12 Onnie Boer, RN, BSN [phone] PT WAS DC'D TO HOME WITH HH PT/OT/RW  05/06/12 Onnie Boer, RN, BSN 1159 PT WAS ADMITTED WITH WEAKNESS.  PTA PT WAS AT HOME WITH SELF CARE.  WILL F/U ON DC NEEDS

## 2012-05-06 NOTE — Evaluation (Signed)
Speech Language Pathology Evaluation Patient Details Name: Minnesota Kohut MRN: [id number] DOB: 07/23/1946 Today's Date: 05/06/2012 Time: 1350-1402 SLP Time Calculation (min): 12 min  Problem List:  Patient Active Problem List  Diagnosis  . Cystocele  . Diabetes mellitus, type 2  . Hypertension  . gerd  . Obesity (BMI 35.0-39.9 without comorbidity)  . Acute left-sided weakness  . CVA (cerebral vascular accident)  . Chest pain  . Bradycardia  . Hypokalemia   Past Medical History:  Past Medical History  Diagnosis Date  . Hypertension   . Diabetes mellitus   . Obesity (BMI 35.0-39.9 without comorbidity)   . GERD (gastroesophageal reflux disease)   . Chest pain 2009    MV with no scar or ischemia, EF 65%   Past Surgical History:  Past Surgical History  Procedure Date  . Back surgery   . Wisdom tooth extraction   . Cholecystectomy   . Temporomandibular joint surgery   . Abdominal hysterectomy 2006    Partial, one ovary left   . Cardiac catheterization 2004    [place] per pt.   HPI:  66 yr old admitted with left weakness and numbness, chest pain.  CT revealed questionable right infact with MRI being negative for acute process.  CVA 1987 full recovered.   Assessment / Plan / Recommendation Clinical Impression  Pt. exhibits functional speech, language and cognition for items assessed.  No recommendation for ST services.  Educated pt. if she notices difficutly once home with cognition or speech, to notify her MD for possible outpt ST if appropriate.    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)   Cognition  Overall  Cognitive Status: Appears within functional limits for tasks assessed Arousal/Alertness: Awake/alert Orientation Level: Oriented X4    Comprehension  Auditory Comprehension Overall Auditory Comprehension: Appears within functional limits for tasks assessed Reading Comprehension Reading Status: Not tested    Expression Verbal Expression Overall Verbal Expression: Appears within functional limits for tasks assessed Written Expression Dominant Hand: Right   Oral / Motor Oral Motor/Sensory Function Overall Oral Motor/Sensory Function: Impaired Labial ROM: Reduced left (minimal) Labial Symmetry: Abnormal symmetry left Lingual ROM: Within Functional Limits Motor Speech Overall Motor Speech: Appears within functional limits for tasks assessed Intelligibility: Intelligible Motor Planning: Witnin functional limits     Royce Macadamia 05/06/2012, 2:15 PM

## 2012-05-06 NOTE — Evaluation (Signed)
Physical Therapy Evaluation Patient Details Name: [name] [name] MRN: [id number] DOB: May 03, 1946 Today's Date: 05/06/2012 Time: 0950-1020 PT Time Calculation (min): 30 min  PT Assessment / Plan / Recommendation Clinical Impression  Pt is 66 y/o female admitted for left sided weakness and r/o CVA vs. TIA.  MRI negative for any acute abnormalities.  However pt with overall mobility impairment and may need SNF at d/c.    PT Assessment  Patient needs continued PT services    Follow Up Recommendations  Skilled nursing facility (depending on pt's progression)    Barriers to Discharge        lEquipment Recommendations  None recommended by SLP    Recommendations for Other Services     Frequency Min 4X/week    Precautions / Restrictions Precautions Precautions: Fall Restrictions Weight Bearing Restrictions: No   Pertinent Vitals/Pain C/o back pain but does not rate; question back pain causing weakness      Mobility  Bed Mobility Bed Mobility: Not assessed Transfers Sit to Stand: 3: Mod assist;From bed;With upper extremity assist Stand to Sit: 4: Min assist;With upper extremity assist;To bed Details for Transfer Assistance: Pt with VC for hand placement and need for blocking of Rt knee secondary to buckling (No noticeable left knee buckling during sit <> stand.) Ambulation/Gait Ambulation/Gait Assistance: 1: +2 Total assist Ambulation/Gait: Patient Percentage: 60% Ambulation Distance (Feet): 15 Feet Assistive device: Rolling walker Ambulation/Gait Assistance Details: +2 (A) to maintain balance with first attempt noticeable right knee buckling and 2nd attempt left knee hyperextension during weight bearing.  Pt ambulated incosistent with strength findings. Gait Pattern: Step-to pattern;Decreased step length - left;Decreased stride length;Left hip hike;Left steppage Modified Rankin (Stroke Patients Only) Pre-Morbid Rankin Score: Moderately severe disability    Exercises     PT  Diagnosis: Acute pain;Difficulty walking;Abnormality of gait;Generalized weakness  PT Problem List: Decreased strength;Decreased range of motion;Decreased activity tolerance;Decreased coordination;Decreased balance;Decreased mobility;Decreased knowledge of use of DME PT Treatment Interventions: DME instruction;Gait training;Stair training;Functional mobility training;Therapeutic activities;Therapeutic exercise;Balance training;Neuromuscular re-education;Patient/family education   PT Goals Acute Rehab PT Goals PT Goal Formulation: With patient Time For Goal Achievement: 05/20/12 Potential to Achieve Goals: Good Pt will go Supine/Side to Sit: with modified independence PT Goal: Supine/Side to Sit - Progress: Goal set today Pt will go Sit to Supine/Side: with modified independence PT Goal: Sit to Supine/Side - Progress: Goal set today Pt will go Sit to Stand: with supervision PT Goal: Sit to Stand - Progress: Goal set today Pt will go Stand to Sit: with supervision PT Goal: Stand to Sit - Progress: Goal set today Pt will Transfer Bed to Chair/Chair to Bed: with supervision PT Transfer Goal: Bed to Chair/Chair to Bed - Progress: Goal set today Pt will Stand: 3 - 5 min;with supervision PT Goal: Stand - Progress: Goal set today Pt will Ambulate: 51 - 150 feet;with least restrictive assistive device;with supervision PT Goal: Ambulate - Progress: Goal set today  Visit Information  Last PT Received On: 05/06/12 Assistance Needed: +2 PT/OT Co-Evaluation/Treatment: Yes    Subjective Data  Patient Stated Goal: "I would like to return to gardening."   Prior Functioning  Home Living Lives With: Son Available Help at Discharge: Family Type of Home: House Home Access: Level entry Home Layout: One level Bathroom Shower/Tub: Engineer, manufacturing systems: Standard Home Adaptive Equipment: Grab bars in shower Prior Function Level of Independence: Independent Able to Take Stairs?:  (needs  to due step to pattern) Driving: Yes 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) Communication Communication: No difficulties Dominant Hand: Right    Cognition  Overall Cognitive Status: Appears within functional limits for tasks assessed/performed Arousal/Alertness: Awake/alert Orientation Level: Appears intact for tasks assessed Behavior During Session: [facility] for tasks performed    Extremity/Trunk Assessment Right Upper Extremity Assessment RUE ROM/Strength/Tone: Within functional levels RUE Sensation: WFL - Light Touch RUE Coordination: WFL - gross/fine motor Left Upper Extremity Assessment LUE ROM/Strength/Tone: Deficits LUE ROM/Strength/Tone Deficits: shoulder flexion4/5 and extension 5/5; elbow WFL; wrist 4-/5; digits 3+/5  LUE Sensation: Deficits LUE Sensation Deficits: pt reports "dull" feeling on LUE LUE Coordination: Deficits LUE Coordination Deficits: fine motor deficits Right Lower Extremity Assessment RLE ROM/Strength/Tone: Within functional levels RLE Sensation: WFL - Light Touch Left Lower Extremity Assessment LLE ROM/Strength/Tone: Deficits LLE ROM/Strength/Tone Deficits: Inconsistent strength testing functional mobility.  Unsure if pt able to understand instructions (4/5 quad; 4+/5 hamstring 3+/5 hip flexors; 4/5 DF) LLE Sensation: WFL - Light Touch   Balance Balance Balance Assessed: Yes Static Sitting Balance Static Sitting - Balance Support: Feet supported Static Sitting - Level of Assistance: 6: Modified independent (Device/Increase time)  End of Session PT - End of Session Equipment Utilized During Treatment: Gait belt;Back brace Activity Tolerance: Patient tolerated treatment well Patient left: in chair;with call bell/phone within reach Nurse Communication: Mobility status   [name] [name] 05/06/2012, 2:28 PM Jake Shark, PT DPT [phone]

## 2012-05-07 ENCOUNTER — Inpatient Hospital Stay (HOSPITAL_COMMUNITY): Payer: Medicare Other

## 2012-05-07 DIAGNOSIS — E1165 Type 2 diabetes mellitus with hyperglycemia: Secondary | ICD-10-CM

## 2012-05-07 DIAGNOSIS — R079 Chest pain, unspecified: Secondary | ICD-10-CM

## 2012-05-07 DIAGNOSIS — R51 Headache: Secondary | ICD-10-CM

## 2012-05-07 DIAGNOSIS — I10 Essential (primary) hypertension: Secondary | ICD-10-CM

## 2012-05-07 DIAGNOSIS — G459 Transient cerebral ischemic attack, unspecified: Secondary | ICD-10-CM

## 2012-05-07 LAB — BASIC METABOLIC PANEL
CO2: 26 mEq/L (ref 19–32)
Calcium: 9.2 mg/dL (ref 8.4–10.5)
Chloride: 104 mEq/L (ref 96–112)
Potassium: 3.4 mEq/L — ABNORMAL LOW (ref 3.5–5.1)
Sodium: 140 mEq/L (ref 135–145)

## 2012-05-07 LAB — GLUCOSE, CAPILLARY: Glucose-Capillary: 167 mg/dL — ABNORMAL HIGH (ref 70–99)

## 2012-05-07 LAB — MAGNESIUM: Magnesium: 1.7 mg/dL (ref 1.5–2.5)

## 2012-05-07 MED ORDER — POTASSIUM CHLORIDE CRYS ER 20 MEQ PO TBCR
40.0000 meq | EXTENDED_RELEASE_TABLET | ORAL | Status: AC
  Administered 2012-05-07: 40 meq via ORAL
  Filled 2012-05-07: qty 2

## 2012-05-07 MED ORDER — TECHNETIUM TC 99M TETROFOSMIN IV KIT
10.0000 | PACK | Freq: Once | INTRAVENOUS | Status: AC | PRN
  Administered 2012-05-07: 10 via INTRAVENOUS

## 2012-05-07 MED ORDER — SENNOSIDES-DOCUSATE SODIUM 8.6-50 MG PO TABS: 1.0000 | ORAL_TABLET | Freq: Every evening | ORAL | Status: DC | PRN

## 2012-05-07 MED ORDER — UNABLE TO FIND: Status: DC

## 2012-05-07 MED ORDER — ACETAMINOPHEN 325 MG PO TABS
ORAL_TABLET | ORAL | Status: AC
  Administered 2012-05-07: 10:00:00
  Filled 2012-05-07: qty 2

## 2012-05-07 MED ORDER — TECHNETIUM TC 99M TETROFOSMIN IV KIT
30.0000 | PACK | Freq: Once | INTRAVENOUS | Status: AC | PRN
  Administered 2012-05-07: 30 via INTRAVENOUS

## 2012-05-07 MED ORDER — ATENOLOL 12.5 MG HALF TABLET: 12.5000 mg | ORAL_TABLET | Freq: Every day | ORAL | Status: DC

## 2012-05-07 MED ORDER — REGADENOSON 0.4 MG/5ML IV SOLN
INTRAVENOUS | Status: AC
  Administered 2012-05-07: 0.4 mg via INTRAVENOUS
  Filled 2012-05-07: qty 5

## 2012-05-07 MED ORDER — BUTALBITAL-APAP-CAFFEINE 50-325-40 MG PO TABS: 1.0000 | ORAL_TABLET | Freq: Four times a day (QID) | ORAL | Status: AC | PRN

## 2012-05-07 NOTE — Progress Notes (Signed)
Patient Name: [name] [name] Date of Encounter: 05/07/2012  Principal Problem:  *CVA (cerebral vascular accident) Active Problems:  Diabetes mellitus, type 2  Hypertension  gerd  Obesity (BMI 35.0-39.9 without comorbidity)  Acute left-sided weakness  Chest pain  Bradycardia  Hypokalemia    SUBJECTIVE: Chest pain and HA have resolved. Numbness has also resolved.   OBJECTIVE Filed Vitals:   05/06/12 2100 05/06/12 2121 05/07/12 0110 05/07/12 0551  BP:  107/69 110/66 132/78  Pulse:  58 51 51  Temp: 89.6 F (32 C) 98 F (36.7 C) 97.3 F (36.3 C) 98.2 F (36.8 C)  TempSrc:  Oral Oral Oral  Resp:  20 20 20   Height:      Weight:      SpO2:  96% 98% 98%    Intake/Output Summary (Last 24 hours) at 05/07/12 0944 Last data filed at 05/06/12 2200  Gross per 24 hour  Intake    240 ml  Output      0 ml  Net    240 ml   Weight change:  Filed Weights   05/05/12 1006  Weight: 196 lb 5 oz (89.047 kg)     PHYSICAL EXAM General: Well developed, well nourished, elderly female in no acute distress. Head: Normocephalic, atraumatic.  Neck: Supple without bruits, JVD not elevated. Lungs:  Resp regular and unlabored, CTA bilaterally. Heart: RRR, S1, S2, no S3, S4, soft systolic murmur. Abdomen: Soft, non-tender, non-distended, BS + x 4.  Extremities: No clubbing, cyanosis, no edema.  Neuro: Alert and oriented X 3. Moves all extremities spontaneously. Psych: Normal affect.  LABS: CBC: Basename 05/06/12 0645 05/05/12 0732  WBC 5.1 4.5  NEUTROABS -- 1.9  HGB 13.2 13.8  HCT 40.2 41.1  MCV 91.0 89.5  PLT 239 281   INR: Basename 05/05/12 0732  INR 1.04   Basic Metabolic Panel: Basename 05/06/12 0645 05/05/12 0732  NA 141 139  K 3.1* 3.4*  CL 105 101  CO2 25 28  GLUCOSE 125* 172*  BUN 15 18  CREATININE 0.93 0.92  CALCIUM 8.7 9.5  MG -- --  PHOS -- --   Liver Function Tests: [facility] 05/05/12 0732  AST 14  ALT 11  ALKPHOS 80  BILITOT 0.4  PROT 7.0    ALBUMIN 3.3*   Cardiac Enzymes: Basename 05/05/12 1527 05/05/12 1126 05/05/12 0732  CKTOTAL 86 93 116  CKMB 2.2 2.0 1.6  CKMBINDEX -- -- --  TROPONINI <0.30 <0.30 <0.30   Hemoglobin A1C: Basename 05/06/12 0645  HGBA1C 7.2*   Fasting Lipid Panel: Basename 05/06/12 0645  CHOL 129  HDL 38*  LDLCALC 72  TRIG 97  CHOLHDL 3.4  LDLDIRECT --   TELE: SR in stress room and during test.      Echo: 05/06/2012  Study Conclusions - Left ventricle: The cavity size was normal. Wall thickness was increased in a pattern of mild LVH. Systolic function was normal. The estimated ejection fraction was in the range of 50% to 55%. Regional wall motion abnormalities cannot be excluded. Features are consistent with a pseudonormal left ventricular filling pattern, with concomitant abnormal relaxation and increased filling pressure (grade 2 diastolic dysfunction). - Mitral valve: Mild regurgitation. - Left atrium: The atrium was mildly dilated. - Pulmonary arteries: Systolic pressure was mildly increased. PA peak pressure: 37mm Hg (S).   Radiology/Studies: Dg Chest 2 View 05/05/2012  *RADIOLOGY REPORT*  Clinical Data: Left-sided chest pain, weakness and shortness of breath.  CHEST - 2 VIEW  Comparison: Chest  x-ray 07/11/2011.  Findings: Lung volumes are normal.  No consolidative airspace disease.  No pleural effusions.  Pulmonary vasculature is normal. Heart size is mildly enlarged (unchanged).  Marked tortuosity of the thoracic aorta.  Surgical clips project over the right upper quadrant of the abdomen, likely from prior cholecystectomy.  IMPRESSION: 1.  No radiographic evidence of acute cardiopulmonary disease. 2.  Mild cardiomegaly is unchanged. 3.  Marked tortuosity of the thoracic aorta. 4.  Status post cholecystectomy.  Original Report Authenticated By: Florencia Reasons, M.D.   Ct Head Wo Contrast 05/05/2012  *RADIOLOGY REPORT*  Clinical Data: Left arm tingling and weakness.  Diabetic and  hypertensive patient.  CT HEAD WITHOUT CONTRAST  Technique:  Contiguous axial images were obtained from the base of the skull through the vertex without contrast.  Comparison: 07/08/2011.  Findings: No intracranial hemorrhage. No intracranial mass lesion detected on this unenhanced exam.  No hydrocephalus.  Mild atrophy.  Remote right external capsule infarct.  Questionable subtle acute right frontal lobe infarct?  Tortuous basilar artery unchanged.  Partially empty sella stable. Prominent ossification of the falx greater to the right once again noted.  Partial opacification ethmoid sinus air cells.  Moderate mucosal thickening with complex appearance right maxillary sinus suggesting chronic changes with adjacent bone thickening.  IMPRESSION: Question small acute right frontal lobe infarct without associated hemorrhage. Evaluation for posterior fossa stroke limited by streak artifact.  Maxillary sinus mucosal thickening and partial opacification ethmoid sinus air cells.  Please see above.  Results discussed with Dr. Rubin Payor 05/05/2012 8:11 a.m.  Original Report Authenticated By: Fuller Canada, M.D.   Mr Brain Wo Contrast 05/05/2012  *RADIOLOGY REPORT*  Clinical Data:  Diabetic hypertensive patient with left-sided numbness and weakness.  MRI HEAD WITHOUT CONTRAST MRA HEAD WITHOUT CONTRAST  Technique: Multiplanar, multiecho pulse sequences of the brain and surrounding structures were obtained according to standard protocol without intravenous contrast.  Angiographic images of the head were obtained using MRA technique without contrast.  Comparison: 05/05/2012 CT.  MRI HEAD  Findings:  Fast technique imaging utilized secondary to claustrophobia.  No acute infarct.  No intracranial hemorrhage.  Mild small vessel disease type changes.  Small perivascular spaces basal ganglia rather than remote lacunar infarcts as questioned on CT.  No hydrocephalus.  No intracranial mass lesion detected on this unenhanced exam.   Complex opacification in the right maxillary sinus.  Mild mucosal thickening right frontal sinus, ethmoid sinus air cells.  Empty sella.  This may be an incidental finding although also described in patients with pseudotumor cerebri.  IMPRESSION: No acute infarct.  Please see above.  MRA HEAD  Findings: Hypoplastic A1 segment of the right anterior cerebral artery.  Narrowed attenuated A2 segment right anterior cerebral artery.  Slightly bulbous appearance anterior communicating artery region without discrete aneurysm noted on this slightly motion degraded exam.  Mild narrowing supraclinoid aspect of the internal carotid artery more notable on the right.  Mild narrowing M1 segment left middle cerebral artery.  Mild narrowing proximal A1 segment left anterior cerebral artery.  Middle cerebral artery mild to slightly moderate branch vessel irregularity.  Decreased number of visualized right middle cerebral artery branches.  Moderate narrowing right vertebral artery just beyond the takeoff of the right PICA.  Mild irregularity left vertebral artery.  Mild to moderate narrowing of portions of the PICAs.  Mild narrowing and irregularity of the basilar artery. Nonvisualization AICAs  Slightly bulbous appearance of the basilar tip without discrete saccular aneurysm.  Moderate tandem  stenoses of the superior cerebellar arteries greater on the right.  Posterior cerebral artery branch vessel irregularity greater on the left.  IMPRESSION: Intracranial atherosclerotic type changes as detailed above most notable involving branch vessel.  Original Report Authenticated By: Fuller Canada, M.D.   Mr Mra Head/brain Wo Cm 05/05/2012  *RADIOLOGY REPORT*  Clinical Data:  Diabetic hypertensive patient with left-sided numbness and weakness.  MRI HEAD WITHOUT CONTRAST MRA HEAD WITHOUT CONTRAST  Technique: Multiplanar, multiecho pulse sequences of the brain and surrounding structures were obtained according to standard protocol without  intravenous contrast.  Angiographic images of the head were obtained using MRA technique without contrast.  Comparison: 05/05/2012 CT.  MRI HEAD  Findings:  Fast technique imaging utilized secondary to claustrophobia.  No acute infarct.  No intracranial hemorrhage.  Mild small vessel disease type changes.  Small perivascular spaces basal ganglia rather than remote lacunar infarcts as questioned on CT.  No hydrocephalus.  No intracranial mass lesion detected on this unenhanced exam.  Complex opacification in the right maxillary sinus.  Mild mucosal thickening right frontal sinus, ethmoid sinus air cells.  Empty sella.  This may be an incidental finding although also described in patients with pseudotumor cerebri.  IMPRESSION: No acute infarct.  Please see above.  MRA HEAD  Findings: Hypoplastic A1 segment of the right anterior cerebral artery.  Narrowed attenuated A2 segment right anterior cerebral artery.  Slightly bulbous appearance anterior communicating artery region without discrete aneurysm noted on this slightly motion degraded exam.  Mild narrowing supraclinoid aspect of the internal carotid artery more notable on the right.  Mild narrowing M1 segment left middle cerebral artery.  Mild narrowing proximal A1 segment left anterior cerebral artery.  Middle cerebral artery mild to slightly moderate branch vessel irregularity.  Decreased number of visualized right middle cerebral artery branches.  Moderate narrowing right vertebral artery just beyond the takeoff of the right PICA.  Mild irregularity left vertebral artery.  Mild to moderate narrowing of portions of the PICAs.  Mild narrowing and irregularity of the basilar artery. Nonvisualization AICAs  Slightly bulbous appearance of the basilar tip without discrete saccular aneurysm.  Moderate tandem stenoses of the superior cerebellar arteries greater on the right.  Posterior cerebral artery branch vessel irregularity greater on the left.  IMPRESSION:  Intracranial atherosclerotic type changes as detailed above most notable involving branch vessel.  Original Report Authenticated By: Fuller Canada, M.D.    Current Medications:    . atenolol  12.5 mg Oral Daily  . cholecalciferol  1,000 Units Oral Daily  . clopidogrel  75 mg Oral Q breakfast  . conjugated estrogens  1 g Vaginal 2 times weekly  . enoxaparin  40 mg Subcutaneous Q24H  . glipiZIDE  5 mg Oral Daily  . insulin aspart  0-9 Units Subcutaneous TID WC  . pantoprazole  40 mg Oral Daily  . regadenoson      . regadenoson  0.4 mg Intravenous Once      . sodium chloride 75 mL/hr at 05/05/12 2015    ASSESSMENT AND PLAN: Chest pain - atypical and typical symptoms. Enzymes are negative for MI, MV for ischemia, change to Lexiscan as pt states unable to do treadmill. F/u on results, no further workup if negative.   Otherwise, per primary MD Principal Problem:  *CVA (cerebral vascular accident) Active Problems:  Diabetes mellitus, type 2  Hypertension  gerd  Obesity (BMI 35.0-39.9 without comorbidity)  Acute left-sided weakness  Bradycardia  Hypokalemia - rec'd 40 meq  yest, K+ from 3.4 ->3.1, will order 40 meq x 1, may need more, per primary MD   Signed, Theodore Demark , PA-C 9:44 AM 05/07/2012

## 2012-05-07 NOTE — Progress Notes (Signed)
Subjective: Feeling better today. No specific complaints.  Objective: Vital signs in last 24 hours: Filed Vitals:   05/07/12 0947 05/07/12 0948 05/07/12 0950 05/07/12 0952  BP: 146/86 146/86 163/89 157/91  Pulse:      Temp:      TempSrc:      Resp:      Height:      Weight:      SpO2:       Weight change:   Intake/Output Summary (Last 24 hours) at 05/07/12 1242 Last data filed at 05/06/12 2200  Gross per 24 hour  Intake    240 ml  Output      0 ml  Net    240 ml    Physical Exam: General: Awake, Oriented, No acute distress. HEENT: EOMI. Neck: Supple CV: S1 and S2 Lungs: Clear to ascultation bilaterally Abdomen: Soft, Nontender, Nondistended, +bowel sounds. Ext: Good pulses. Trace edema.  Lab Results: Basic Metabolic Panel:  Lab 05/06/12 4098 05/05/12 0732  NA 141 139  K 3.1* 3.4*  CL 105 101  CO2 25 28  GLUCOSE 125* 172*  BUN 15 18  CREATININE 0.93 0.92  CALCIUM 8.7 9.5  MG -- --  PHOS -- --   Liver Function Tests:  Lab 05/05/12 0732  AST 14  ALT 11  ALKPHOS 80  BILITOT 0.4  PROT 7.0  ALBUMIN 3.3*   No results found for this basename: LIPASE:5,AMYLASE:5 in the last 168 hours No results found for this basename: AMMONIA:5 in the last 168 hours CBC:  Lab 05/06/12 0645 05/05/12 0732  WBC 5.1 4.5  NEUTROABS -- 1.9  HGB 13.2 13.8  HCT 40.2 41.1  MCV 91.0 89.5  PLT 239 281   Cardiac Enzymes:  Lab 05/05/12 1527 05/05/12 1126 05/05/12 0732  CKTOTAL 86 93 116  CKMB 2.2 2.0 1.6  CKMBINDEX -- -- --  TROPONINI <0.30 <0.30 <0.30   BNP (last 3 results) No results found for this basename: PROBNP:3 in the last 8760 hours CBG:  Lab 05/07/12 1147 05/07/12 0724 05/06/12 2122 05/06/12 1646 05/06/12 1154  GLUCAP 167* 111* 197* 92 132*    Basename 05/06/12 0645  HGBA1C 7.2*   Other Labs: No components found with this basename: POCBNP:3 No results found for this basename: DDIMER:2 in the last 168 hours  Lab 05/06/12 0645  CHOL 129  HDL 38*    LDLCALC 72  TRIG 97  CHOLHDL 3.4  LDLDIRECT --   No results found for this basename: TSH,T4TOTAL,FREET3,T3FREE,FREET4,THYROIDAB in the last 168 hours No results found for this basename: VITAMINB12:2,FOLATE:2,FERRITIN:2,TIBC:2,IRON:2,RETICCTPCT:2 in the last 168 hours  Micro Results: No results found for this or any previous visit (from the past 240 hour(s)).  Studies/Results: No results found.  Medications: I have reviewed the patient's current medications. Scheduled Meds:    . acetaminophen      . atenolol  12.5 mg Oral Daily  . cholecalciferol  1,000 Units Oral Daily  . clopidogrel  75 mg Oral Q breakfast  . conjugated estrogens  1 g Vaginal 2 times weekly  . enoxaparin  40 mg Subcutaneous Q24H  . glipiZIDE  5 mg Oral Daily  . insulin aspart  0-9 Units Subcutaneous TID WC  . pantoprazole  40 mg Oral Daily  . potassium chloride  40 mEq Oral to XRAY  . regadenoson  0.4 mg Intravenous Once   Continuous Infusions:  PRN Meds:.acetaminophen, acetaminophen, butalbital-acetaminophen-caffeine, hydrALAZINE, ibuprofen, ondansetron (ZOFRAN) IV, senna-docusate, technetium tetrofosmin, technetium tetrofosmin  Assessment/Plan: Left-sided weakness and  numbness due to possible TIA.  CT of the head on 05/05/2012 showed question small acute right frontal lobe infarct without associated hemorrhage, however MRI MRA of the head on 05/05/2012 showed no acute infarct, it showed arthrosclerotic changes.  Neurology consulted, appreciate their input.  Continue Plavix, aspirin discontinued.  Carotid Dopplers shows no significant ICA stenosis bilaterally.    LDL 72.    Chest pain  EKG shows sinus rhythm. 2-D echocardiogram on 05/06/2012 showed EF 50-55%, no regional wall motion abnormalities, grade 2 diastolic dysfunction.  Troponin negative x3.  Appreciate cardiology input, patient had myocardial perfusion scan today.  Type 2 diabetes uncontrolled with complications  Continue glipizide. Sensitive  sliding scale insulin. Hemoglobin A1c 7.2 on 05/06/2012.  Hypertension  Stable.  Continue atenolol.  Bradycardia Continue to monitor.  Continue atenolol at low dose.  GERD with intermittent right lower quadrant abdominal pain.  Continue PPI.   Obesity  Diet and exercise as outpatient.   Hypokalemia Replace as needed.  Generalized weakness Patient at this time prefer going home with home health rather than SNF.  Prophylaxis  Lovenox.   CODE STATUS  Full code.  Disposition Pending stress test results.   LOS: 2 days  Mahdi Frye A, MD 05/07/2012, 12:42 PM

## 2012-05-07 NOTE — Progress Notes (Signed)
Physical Therapy Treatment Patient Details Name: [name] [name] MRN: [id number] DOB: [birth year] Today's Date: 05/07/2012 Time: 1478-2956 PT Time Calculation (min): 17 min  PT Assessment / Plan / Recommendation Comments on Treatment Session  Patient agreeable to therapy today even though she stated that she was tired from all the testing she had done this morning. PT recommending SNF however patient stated that she was going to go home. EPatient needs to arrange assistance at home prior to DC    Follow Up Recommendations  Skilled nursing facility    Barriers to Discharge        Equipment Recommendations  Defer to next venue    Recommendations for Other Services    Frequency Min 4X/week   Plan Discharge plan remains appropriate;Frequency remains appropriate    Precautions / Restrictions Precautions Precautions: Fall Restrictions Weight Bearing Restrictions: No   Pertinent Vitals/Pain     Mobility  Transfers Sit to Stand: 4: Min guard;With upper extremity assist;From bed Stand to Sit: 4: Min guard;With upper extremity assist;To chair/3-in-1 Details for Transfer Assistance: Cues for safe technique and not to pull up on RW Ambulation/Gait Ambulation/Gait Assistance: 1: +2 Total assist Ambulation/Gait: Patient Percentage: 70% Ambulation Distance (Feet): 80 Feet Assistive device: Rolling walker Ambulation/Gait Assistance Details: +2 assistance in order for one person to ambulate behind patient and block LLE with R step through as patient with LLE buckling during todays session. Patient with crepitis of L knee throughout.  Gait Pattern: Step-through pattern;Decreased stride length;Decreased trunk rotation;Left hip hike Modified Rankin (Stroke Patients Only) Pre-Morbid Rankin Score: Moderate disability    Exercises     PT Diagnosis:    PT Problem List:   PT Treatment Interventions:     PT Goals Acute Rehab PT Goals PT Goal: Sit to Stand - Progress: Progressing toward  goal PT Goal: Stand to Sit - Progress: Progressing toward goal PT Transfer Goal: Bed to Chair/Chair to Bed - Progress: Progressing toward goal PT Goal: Stand - Progress: Progressing toward goal PT Goal: Ambulate - Progress: Progressing toward goal  Visit Information  Last PT Received On: 05/07/12 Assistance Needed: +2 (for safety)    Subjective Data  Subjective: Ive had alot going on today   Cognition  Overall Cognitive Status: Appears within functional limits for tasks assessed/performed Arousal/Alertness: Awake/alert Orientation Level: Appears intact for tasks assessed Behavior During Session: [facility] for tasks performed    Balance     End of Session PT - End of Session Equipment Utilized During Treatment: Gait belt Activity Tolerance: Patient tolerated treatment well Patient left: in chair;with call bell/phone within reach Nurse Communication: Mobility status    Fredrich Birks 05/07/2012, 2:33 PM  05/07/2012 Fredrich Birks PTA [phone] pager [phone] office

## 2012-05-07 NOTE — Progress Notes (Signed)
Pt d/c home with stroke d/c info.

## 2012-05-07 NOTE — Discharge Summary (Addendum)
Discharge Summary  [name] [name] MR#: [id number]  DOB:03-08-1946  Date of Admission: 05/05/2012 Date of Discharge: 05/07/2012  Patient's PCP: Florentina Jenny, MD  Attending Physician:Mourad Cwikla A  Consults: Dr. Pearlean Brownie and Dr. Audery Amel, Neurology Dr. [facility]  Discharge Diagnoses: Principal Problem:  *Acute left-sided weakness Active Problems:  Diabetes mellitus, type 2  Hypertension  gerd  Obesity (BMI 35.0-39.9 without comorbidity)  Chest pain  Bradycardia  Hypokalemia   Brief Admitting History and Physical [name] [name] is a 66 y.o. African American female with history of hypertension, diabetes, obesity, GERD, CVA in 1987 who presented with left-sided numbness and weakness with left sided chest pain.  Discharge Medications Medication List  As of 05/07/2012  4:35 PM   STOP taking these medications         hydrochlorothiazide 50 MG tablet         TAKE these medications         aspirin 81 MG tablet   Take 81 mg by mouth daily.      atenolol 12.5 mg Tabs   Commonly known as: TENORMIN   Take 0.5 tablets (12.5 mg total) by mouth daily.      butalbital-acetaminophen-caffeine 50-325-40 MG per tablet   Commonly known as: FIORICET, ESGIC   Take 1 tablet by mouth every 6 (six) hours as needed for headache.      cholecalciferol 1000 UNITS tablet   Commonly known as: VITAMIN D   Take 1,000 Units by mouth daily.      conjugated estrogens vaginal cream   Commonly known as: PREMARIN   Place 1 g vaginally 2 (two) times a week.      glipiZIDE 5 MG tablet   Commonly known as: GLUCOTROL   Take 5 mg by mouth daily.      PREVACID PO   Take 30 mg by mouth.      senna-docusate 8.6-50 MG per tablet   Commonly known as: Senokot-S   Take 1 tablet by mouth at bedtime as needed for constipation.      UNABLE TO FIND   Please excuse patient from any work obligations from 05/05/2012 till 05/12/2012 as the patient has been hospitalized.            Hospital  Course: Left-sided weakness and numbness  Etiology unclear. CT of the head on 05/05/2012 showed question small acute right frontal lobe infarct without associated hemorrhage, however MRI/MRA of the head on 05/05/2012 showed no acute infarct, it showed arthrosclerotic changes. Neurology evaluated the patient thought patient had left sided weakness secondary to non-organic etiology. Initially was placed on Plavix, given negative stroke on MRI, neurology recommended continuing only aspirin at discharge. Carotid Dopplers shows no significant ICA stenosis bilaterally. LDL 72. Given persistent left sided weakness PT recommended SNF, patient preferred to go home with home health. Home health and rolling walker arranged at discharge.  Atypical chest pain  EKG shows sinus rhythm. 2-D echocardiogram on 05/06/2012 showed EF 50-55%, no regional wall motion abnormalities, grade 2 diastolic dysfunction. Troponin negative x3. [facility] evaluated the patient and she had myocardial perfusion scan on 05/07/2012, which did not show evidence for ischemia. Patient to followup with cardiology as outpatient.   Type 2 diabetes uncontrolled with complications  Continue glipizide. Sensitive sliding scale insulin. Hemoglobin A1c 7.2 on 05/06/2012.   Hypertension  Stable. Continue atenolol. Further titration of antihypertensive medications as outpatient. Hydrochlorothiazide discontinued during the hospital stay (unsure if this medication was already discontinued prior to admission).  Bradycardia  Was bradycardic during the hospital stay, atenolol dose was decreased. Further management as outpatient.   GERD with intermittent right lower quadrant abdominal pain.  Continue PPI.   Obesity  Diet and exercise as outpatient.   Hypokalemia  Replace as needed.   Generalized weakness  Patient at this time prefer going home with home health rather than SNF.  Headache Patient initially on admission had a headache which was  not relieved by tylenol. Was given PRN Fioricet, which relieved her headache.  Day of Discharge BP 137/85  Pulse 50  Temp 98 F (36.7 C) (Oral)  Resp 20  Ht 5\' 3"  (1.6 m)  Wt 89.047 kg (196 lb 5 oz)  BMI 34.78 kg/m2  SpO2 100%  Results for orders placed during the hospital encounter of 05/05/12 (from the past 48 hour(s))  GLUCOSE, CAPILLARY     Status: Abnormal   Collection Time   05/05/12  4:39 PM      Component Value Range Comment   Glucose-Capillary 141 (*) 70 - 99 mg/dL   GLUCOSE, CAPILLARY     Status: Abnormal   Collection Time   05/05/12  9:41 PM      Component Value Range Comment   Glucose-Capillary 131 (*) 70 - 99 mg/dL   GLUCOSE, CAPILLARY     Status: Abnormal   Collection Time   05/06/12  6:44 AM      Component Value Range Comment   Glucose-Capillary 124 (*) 70 - 99 mg/dL   HEMOGLOBIN W0J     Status: Abnormal   Collection Time   05/06/12  6:45 AM      Component Value Range Comment   Hemoglobin A1C 7.2 (*) <5.7 %    Mean Plasma Glucose 160 (*) <117 mg/dL   CBC     Status: Normal   Collection Time   05/06/12  6:45 AM      Component Value Range Comment   WBC 5.1  4.0 - 10.5 K/uL    RBC 4.42  3.87 - 5.11 MIL/uL    Hemoglobin 13.2  12.0 - 15.0 g/dL    HCT 81.1  91.4 - 78.2 %    MCV 91.0  78.0 - 100.0 fL    MCH 29.9  26.0 - 34.0 pg    MCHC 32.8  30.0 - 36.0 g/dL    RDW 95.6  21.3 - 08.6 %    Platelets 239  150 - 400 K/uL   BASIC METABOLIC PANEL     Status: Abnormal   Collection Time   05/06/12  6:45 AM      Component Value Range Comment   Sodium 141  135 - 145 mEq/L    Potassium 3.1 (*) 3.5 - 5.1 mEq/L    Chloride 105  96 - 112 mEq/L    CO2 25  19 - 32 mEq/L    Glucose, Bld 125 (*) 70 - 99 mg/dL    BUN 15  6 - 23 mg/dL    Creatinine, Ser 5.78  0.50 - 1.10 mg/dL    Calcium 8.7  8.4 - 46.9 mg/dL    GFR calc non Af Amer 63 (*) >90 mL/min    GFR calc Af Amer 73 (*) >90 mL/min   LIPID PANEL     Status: Abnormal   Collection Time   05/06/12  6:45 AM       Component Value Range Comment   Cholesterol 129  0 - 200 mg/dL    Triglycerides 97  <629 mg/dL  HDL 38 (*) >39 mg/dL    Total CHOL/HDL Ratio 3.4      VLDL 19  0 - 40 mg/dL    LDL Cholesterol 72  0 - 99 mg/dL   GLUCOSE, CAPILLARY     Status: Abnormal   Collection Time   05/06/12 11:54 AM      Component Value Range Comment   Glucose-Capillary 132 (*) 70 - 99 mg/dL   GLUCOSE, CAPILLARY     Status: Normal   Collection Time   05/06/12  4:46 PM      Component Value Range Comment   Glucose-Capillary 92  70 - 99 mg/dL   GLUCOSE, CAPILLARY     Status: Abnormal   Collection Time   05/06/12  9:22 PM      Component Value Range Comment   Glucose-Capillary 197 (*) 70 - 99 mg/dL   GLUCOSE, CAPILLARY     Status: Abnormal   Collection Time   05/07/12  7:24 AM      Component Value Range Comment   Glucose-Capillary 111 (*) 70 - 99 mg/dL    Comment 1 Documented in Chart      Comment 2 Notify RN     BASIC METABOLIC PANEL     Status: Abnormal   Collection Time   05/07/12 11:22 AM      Component Value Range Comment   Sodium 140  135 - 145 mEq/L    Potassium 3.4 (*) 3.5 - 5.1 mEq/L    Chloride 104  96 - 112 mEq/L    CO2 26  19 - 32 mEq/L    Glucose, Bld 140 (*) 70 - 99 mg/dL    BUN 14  6 - 23 mg/dL    Creatinine, Ser 4.09  0.50 - 1.10 mg/dL    Calcium 9.2  8.4 - 81.1 mg/dL    GFR calc non Af Amer 75 (*) >90 mL/min    GFR calc Af Amer 87 (*) >90 mL/min   MAGNESIUM     Status: Normal   Collection Time   05/07/12 11:22 AM      Component Value Range Comment   Magnesium 1.7  1.5 - 2.5 mg/dL   GLUCOSE, CAPILLARY     Status: Abnormal   Collection Time   05/07/12 11:47 AM      Component Value Range Comment   Glucose-Capillary 167 (*) 70 - 99 mg/dL     Dg Chest 2 View  08/03/7828  *RADIOLOGY REPORT*  Clinical Data: Left-sided chest pain, weakness and shortness of breath.  CHEST - 2 VIEW  Comparison: Chest x-ray 07/11/2011.  Findings: Lung volumes are normal.  No consolidative airspace disease.  No  pleural effusions.  Pulmonary vasculature is normal. Heart size is mildly enlarged (unchanged).  Marked tortuosity of the thoracic aorta.  Surgical clips project over the right upper quadrant of the abdomen, likely from prior cholecystectomy.  IMPRESSION: 1.  No radiographic evidence of acute cardiopulmonary disease. 2.  Mild cardiomegaly is unchanged. 3.  Marked tortuosity of the thoracic aorta. 4.  Status post cholecystectomy.  Original Report Authenticated By: Florencia Reasons, M.D.   Ct Head Wo Contrast  05/05/2012  *RADIOLOGY REPORT*  Clinical Data: Left arm tingling and weakness.  Diabetic and hypertensive patient.  CT HEAD WITHOUT CONTRAST  Technique:  Contiguous axial images were obtained from the base of the skull through the vertex without contrast.  Comparison: 07/08/2011.  Findings: No intracranial hemorrhage. No intracranial mass lesion detected on this unenhanced exam.  No hydrocephalus.  Mild atrophy.  Remote right external capsule infarct.  Questionable subtle acute right frontal lobe infarct?  Tortuous basilar artery unchanged.  Partially empty sella stable. Prominent ossification of the falx greater to the right once again noted.  Partial opacification ethmoid sinus air cells.  Moderate mucosal thickening with complex appearance right maxillary sinus suggesting chronic changes with adjacent bone thickening.  IMPRESSION: Question small acute right frontal lobe infarct without associated hemorrhage. Evaluation for posterior fossa stroke limited by streak artifact.  Maxillary sinus mucosal thickening and partial opacification ethmoid sinus air cells.  Please see above.  Results discussed with Dr. Rubin Payor 05/05/2012 8:11 a.m.  Original Report Authenticated By: Fuller Canada, M.D.   Mr Brain Wo Contrast  05/05/2012  *RADIOLOGY REPORT*  Clinical Data:  Diabetic hypertensive patient with left-sided numbness and weakness.  MRI HEAD WITHOUT CONTRAST MRA HEAD WITHOUT CONTRAST  Technique:  Multiplanar, multiecho pulse sequences of the brain and surrounding structures were obtained according to standard protocol without intravenous contrast.  Angiographic images of the head were obtained using MRA technique without contrast.  Comparison: 05/05/2012 CT.  MRI HEAD  Findings:  Fast technique imaging utilized secondary to claustrophobia.  No acute infarct.  No intracranial hemorrhage.  Mild small vessel disease type changes.  Small perivascular spaces basal ganglia rather than remote lacunar infarcts as questioned on CT.  No hydrocephalus.  No intracranial mass lesion detected on this unenhanced exam.  Complex opacification in the right maxillary sinus.  Mild mucosal thickening right frontal sinus, ethmoid sinus air cells.  Empty sella.  This may be an incidental finding although also described in patients with pseudotumor cerebri.  IMPRESSION: No acute infarct.  Please see above.  MRA HEAD  Findings: Hypoplastic A1 segment of the right anterior cerebral artery.  Narrowed attenuated A2 segment right anterior cerebral artery.  Slightly bulbous appearance anterior communicating artery region without discrete aneurysm noted on this slightly motion degraded exam.  Mild narrowing supraclinoid aspect of the internal carotid artery more notable on the right.  Mild narrowing M1 segment left middle cerebral artery.  Mild narrowing proximal A1 segment left anterior cerebral artery.  Middle cerebral artery mild to slightly moderate branch vessel irregularity.  Decreased number of visualized right middle cerebral artery branches.  Moderate narrowing right vertebral artery just beyond the takeoff of the right PICA.  Mild irregularity left vertebral artery.  Mild to moderate narrowing of portions of the PICAs.  Mild narrowing and irregularity of the basilar artery. Nonvisualization AICAs  Slightly bulbous appearance of the basilar tip without discrete saccular aneurysm.  Moderate tandem stenoses of the superior  cerebellar arteries greater on the right.  Posterior cerebral artery branch vessel irregularity greater on the left.  IMPRESSION: Intracranial atherosclerotic type changes as detailed above most notable involving branch vessel.  Original Report Authenticated By: Fuller Canada, M.D.   Nm Myocar Multi W/spect W/wall Motion / Ef  05/07/2012  Mrs. Ibach is a [age]-year-old female who was admitted to the hospital with episodes of atypical chest pain.  She has a history of morbid obesity.  The patient was scheduled for a Lexiscan Myoview study for further evaluation.  The patient received an IV injection of Myoview and the resting Myoview images were obtained following a brief delay.  She then received an IV injection of Lexiscan.  There were no ST-T wave changes following the Lexiscan study.  A second dose of Myoview was injected shortly after the Lexiscan effusion and quantitated gated SPECT  images were obtained following brief delay.  The raw data images reveal significant uptake  below the diaphragm. This partially obscures the inferior wall.  The stress Myoview images reveal a small area of mild anteroapical attenuation and mild attenuation of the inferior basal lateral walls with fairly normal uptake in the other regions.  The resting Myoview images reveal a similar pattern of a small area of mild attenuation in the anterior wall with a small area of mild attenuation in the inferior basal lateral wall.   There is no evidence of reversible ischemia.  The quantitated gated SPECT images reveal an end diastolic volume of 92 ml with an end-systolic volume of 36 ml.  The cine loop images reveal normal thickening and normal contractility in all areas of the myocardium.  The ejection fraction is 60%.  This is interpreted as a negative Lexiscan Myoview study.  There are some areas of mild attenuation that are thought to be due to interference from uptake below the diaphragm and also some mild degree of breast attenuation.   There is no evidence of ischemia. There is no evidence of previous infarction.  Left ventricular systolic function is normal with an EF of 60%.  Alvia Grove MD, [facility]  Original Report Authenticated By: Richardean Canal   Mr Mra Head/brain Wo Cm  05/05/2012  *RADIOLOGY REPORT*  Clinical Data:  Diabetic hypertensive patient with left-sided numbness and weakness.  MRI HEAD WITHOUT CONTRAST MRA HEAD WITHOUT CONTRAST  Technique: Multiplanar, multiecho pulse sequences of the brain and surrounding structures were obtained according to standard protocol without intravenous contrast.  Angiographic images of the head were obtained using MRA technique without contrast.  Comparison: 05/05/2012 CT.  MRI HEAD  Findings:  Fast technique imaging utilized secondary to claustrophobia.  No acute infarct.  No intracranial hemorrhage.  Mild small vessel disease type changes.  Small perivascular spaces basal ganglia rather than remote lacunar infarcts as questioned on CT.  No hydrocephalus.  No intracranial mass lesion detected on this unenhanced exam.  Complex opacification in the right maxillary sinus.  Mild mucosal thickening right frontal sinus, ethmoid sinus air cells.  Empty sella.  This may be an incidental finding although also described in patients with pseudotumor cerebri.  IMPRESSION: No acute infarct.  Please see above.  MRA HEAD  Findings: Hypoplastic A1 segment of the right anterior cerebral artery.  Narrowed attenuated A2 segment right anterior cerebral artery.  Slightly bulbous appearance anterior communicating artery region without discrete aneurysm noted on this slightly motion degraded exam.  Mild narrowing supraclinoid aspect of the internal carotid artery more notable on the right.  Mild narrowing M1 segment left middle cerebral artery.  Mild narrowing proximal A1 segment left anterior cerebral artery.  Middle cerebral artery mild to slightly moderate branch vessel irregularity.  Decreased number of visualized  right middle cerebral artery branches.  Moderate narrowing right vertebral artery just beyond the takeoff of the right PICA.  Mild irregularity left vertebral artery.  Mild to moderate narrowing of portions of the PICAs.  Mild narrowing and irregularity of the basilar artery. Nonvisualization AICAs  Slightly bulbous appearance of the basilar tip without discrete saccular aneurysm.  Moderate tandem stenoses of the superior cerebellar arteries greater on the right.  Posterior cerebral artery branch vessel irregularity greater on the left.  IMPRESSION: Intracranial atherosclerotic type changes as detailed above most notable involving branch vessel.  Original Report Authenticated By: Fuller Canada, M.D.   2D ECHO 05/06/2012 Study Conclusions - Left ventricle: The  cavity size was normal. Wall thickness was increased in a pattern of mild LVH. Systolic function was normal. The estimated ejection fraction was in the range of 50% to 55%. Regional wall motion abnormalities cannot be excluded. Features are consistent with a pseudonormal left ventricular filling pattern, with concomitant abnormal relaxation and increased filling pressure (grade 2 diastolic dysfunction). - Mitral valve: Mild regurgitation. - Left atrium: The atrium was mildly dilated. - Pulmonary arteries: Systolic pressure was mildly increased. PA peak pressure: 37mm Hg (S).    Disposition: Home with home health.  Diet: Diabetic diet.  Activity: Resume as tolerated by physical therapy.   Follow-up Appts: Discharge Orders    Future Orders Please Complete By Expires   Diet Carb Modified      Increase activity slowly      Discharge instructions      Comments:   Followup with Florentina Jenny, MD (PCP) in 1 week. Followup with Dr. Pearlean Brownie (neurology) in 2 months. Followup with [facility] in 2 weeks.      TESTS THAT NEED FOLLOW-UP None  Time spent on discharge, talking to the patient, and coordinating care: 35  mins.   Signed: Cristal Ford, MD 05/07/2012, 4:35 PM

## 2012-05-07 NOTE — Discharge Instructions (Signed)
STROKE/TIA DISCHARGE INSTRUCTIONS SMOKING Cigarette smoking nearly doubles your risk of having a stroke & is the single most alterable risk factor  If you smoke or have smoked in the last 12 months, you are advised to quit smoking for your health.  Most of the excess cardiovascular risk related to smoking disappears within a year of stopping.  Ask you doctor about anti-smoking medications  [place] Quit Line: 1-800-QUIT NOW  Free Smoking Cessation Classes ([phone]  CHOLESTEROL Know your levels; limit fat & cholesterol in your diet  Lipid Panel     Component Value Date/Time   CHOL  Value: 143        ATP III CLASSIFICATION:  <200     mg/dL   Desirable  478-295  mg/dL   Borderline High  >=621    mg/dL   High 01/25/6577 4696   TRIG 78 01/07/2008 0325   HDL 39* 01/07/2008 0325   CHOLHDL 3.7 01/07/2008 0325   VLDL 16 01/07/2008 0325   LDLCALC  Value: 88        Total Cholesterol/HDL:CHD Risk Coronary Heart Disease Risk Table                     Men   Women  1/2 Average Risk   3.4   3.3 01/07/2008 0325      Many patients benefit from treatment even if their cholesterol is at goal.  Goal: Total Cholesterol (CHOL) less than 160  Goal:  Triglycerides (TRIG) less than 150  Goal:  HDL greater than 40  Goal:  LDL (LDLCALC) less than 100   BLOOD PRESSURE American Stroke Association blood pressure target is less that 120/80 mm/Hg  Your discharge blood pressure is:  BP: 108/59 mmHg  Monitor your blood pressure  Limit your salt and alcohol intake  Many individuals will require more than one medication for high blood pressure  DIABETES (A1c is a blood sugar average for last 3 months) Goal HGBA1c is under 7% (HBGA1c is blood sugar average for last 3 months)  Diabetes: {STROKE DC DIABETES:22357}    Lab Results  Component Value Date   HGBA1C  Value: 6.3 (NOTE)   The ADA recommends the following therapeutic goals for glycemic   control related to Hgb A1C measurement:   Goal of Therapy:   < 7.0% Hgb  A1C   Action Suggested:  > 8.0% Hgb A1C   Ref:  Diabetes Care, 22, Suppl. 1, 1999* 01/06/2008     Your HGBA1c can be lowered with medications, healthy diet, and exercise.  Check your blood sugar as directed by your physician  Call your physician if you experience unexplained or low blood sugars.  PHYSICAL ACTIVITY/REHABILITATION Goal is 30 minutes at least 4 days per week    {STROKE DC ACTIVITY/REHAB:22359}  Activity decreases your risk of heart attack and stroke and makes your heart stronger.  It helps control your weight and blood pressure; helps you relax and can improve your mood.  Participate in a regular exercise program.  Talk with your doctor about the best form of exercise for you (dancing, walking, swimming, cycling).  DIET/WEIGHT Goal is to maintain a healthy weight  Your discharge diet is: Carb Control *** liquids Your height is:  Height: 5\' 3"  (160 cm) Your current weight is: Weight: 89.047 kg (196 lb 5 oz) Your Body Mass Index (BMI) is:  BMI (Calculated): 34.8   Following the type of diet specifically designed for you will help prevent another stroke.  Your goal weight range is:  ***  Your goal Body Mass Index (BMI) is 19-24.  Healthy food habits can help reduce 3 risk factors for stroke:  High cholesterol, hypertension, and excess weight.  RESOURCES Stroke/Support Group:  Call [phone]  they meet the 3rd Sunday of the month on the Rehab Unit at [facility] [place], [place] ( no meetings June, July & Aug).  STROKE EDUCATION PROVIDED/REVIEWED AND GIVEN TO PATIENT Stroke warning signs and symptoms How to activate emergency medical system (call 911). Medications prescribed at discharge. Need for follow-up after discharge. Personal risk factors for stroke. Pneumonia vaccine given:   {STROKE DC YES/NO/DATE:22363} Flu vaccine given:   {STROKE DC YES/NO/DATE:22363} My questions have been answered, the writing is legible, and I understand these instructions.  I will adhere to these  goals & educational materials that have been provided to me after my discharge from the hospital.

## 2012-05-07 NOTE — Progress Notes (Signed)
Stroke Team Progress Note  HISTORY  66 y.o. African American female with history of hypertension, diabetes, obesity, GERD, CVA in 65 ([place], [place] area) who is seen for possible TIA vs stroke. Hx per pt (fair historian). Patient noted that over the last week she has been feeling "bad," (nausea). She woke up at [facility] for work and note left-sided numbness, weakness/heaviness, and chest pain and felt like she could not catch her breath. She also had some trouble getting out her words and felt near-syncopal. Initially no headache, but then developed a "10/10" headache (now a 7/10). She notes last significant headache was "years" ago. EMS summoned and in ER had Head CT noted to have question of small acute right frontal lobe infarct without hemorrhage. Since admission she notes she now feels better, but has not yet gotten out of bed. Denies diplopia or right sided complaints. Does note a similar event in 1986 ([place], [place]) and was told she had a "TIA" at the time and placed on aspirin.   Patient was not a TPA candidate secondary to the fact that she woke up with these symptoms.    SUBJECTIVE Finishing myoview now. Sitting in wheelchair. Says she feels better.   OBJECTIVE Most recent Vital Signs: Filed Vitals:   05/06/12 2100 05/06/12 2121 05/07/12 0110 05/07/12 0551  BP:  107/69 110/66 132/78  Pulse:  58 51 51  Temp: 89.6 F (32 C) 98 F (36.7 C) 97.3 F (36.3 C) 98.2 F (36.8 C)  TempSrc:  Oral Oral Oral  Resp:  20 20 20   Height:      Weight:      SpO2:  96% 98% 98%   CBG (last 3)   Basename 05/07/12 0724 05/06/12 2122 05/06/12 1646  GLUCAP 111* 197* 92   Intake/Output from previous day: 06/17 0701 - 06/18 0700 In: 240 [P.O.:240] Out: -   IV Fluid Intake:      . sodium chloride 75 mL/hr at 05/05/12 2015    MEDICATIONS     . atenolol  12.5 mg Oral Daily  . cholecalciferol  1,000 Units Oral Daily  . clopidogrel  75 mg Oral Q breakfast  . conjugated estrogens  1 g  Vaginal 2 times weekly  . enoxaparin  40 mg Subcutaneous Q24H  . glipiZIDE  5 mg Oral Daily  . insulin aspart  0-9 Units Subcutaneous TID WC  . pantoprazole  40 mg Oral Daily  . potassium chloride  40 mEq Oral Once  . regadenoson  0.4 mg Intravenous Once   PRN:  acetaminophen, acetaminophen, butalbital-acetaminophen-caffeine, hydrALAZINE, ibuprofen, ondansetron (ZOFRAN) IV, senna-docusate  Diet:  NPO thin liquids Activity:  OOB to chair DVT Prophylaxis:  lovenox  CLINICALLY SIGNIFICANT STUDIES Basic Metabolic Panel:   Lab 05/06/12 0645 05/05/12 0732  NA 141 139  K 3.1* 3.4*  CL 105 101  CO2 25 28  GLUCOSE 125* 172*  BUN 15 18  CREATININE 0.93 0.92  CALCIUM 8.7 9.5  MG -- --  PHOS -- --   Liver Function Tests:   Lab 05/05/12 0732  AST 14  ALT 11  ALKPHOS 80  BILITOT 0.4  PROT 7.0  ALBUMIN 3.3*   CBC:   Lab 05/06/12 0645 05/05/12 0732  WBC 5.1 4.5  NEUTROABS -- 1.9  HGB 13.2 13.8  HCT 40.2 41.1  MCV 91.0 89.5  PLT 239 281   Coagulation:   Lab 05/05/12 0732  LABPROT 13.8  INR 1.04   Cardiac Enzymes:   Lab 05/05/12 1527  05/05/12 1126 05/05/12 0732  CKTOTAL 86 93 116  CKMB 2.2 2.0 1.6  CKMBINDEX -- -- --  TROPONINI <0.30 <0.30 <0.30   Urinalysis:   Lab 05/05/12 1107  COLORURINE YELLOW  LABSPEC 1.009  PHURINE 7.5  GLUCOSEU NEGATIVE  HGBUR NEGATIVE  BILIRUBINUR NEGATIVE  KETONESUR NEGATIVE  PROTEINUR NEGATIVE  UROBILINOGEN 0.2  NITRITE NEGATIVE  LEUKOCYTESUR NEGATIVE   Lipid Panel    Component Value Date/Time   CHOL 129 05/06/2012 0645   TRIG 97 05/06/2012 0645   HDL 38* 05/06/2012 0645   CHOLHDL 3.4 05/06/2012 0645   VLDL 19 05/06/2012 0645   LDLCALC 72 05/06/2012 0645   HgbA1C  Lab Results  Component Value Date   HGBA1C 7.2* 05/06/2012    Urine Drug Screen:   No results found for this basename: labopia,  cocainscrnur,  labbenz,  amphetmu,  thcu,  labbarb    Alcohol Level: No results found for this basename: ETH:2 in the last 168  hours  Dg Chest 2 View  05/05/2012    No radiographic evidence of acute cardiopulmonary disease. 2.  Mild cardiomegaly is unchanged. 3.  Marked tortuosity of the thoracic aorta. 4.  Status post cholecystectomy.    Ct Head Wo Contrast  05/05/2012  Remote right external capsule infarct.   Question small acute right frontal lobe infarct without associated hemorrhage. Evaluation for posterior fossa stroke limited by streak artifact.   Mr Brain   05/05/12 Intracranial atherosclerotic type changes as detailed above most notable involving branch vessel.   Mild small vessel disease type changes.  Small perivascular spaces basal ganglia rather than remote lacunar infarcts: Intracranial atherosclerotic type changes as detailed above most notable involving branch vessel.    CT of the brain  Question small acute right frontal lobe infarct without associated hemorrhage. Evaluation for posterior fossa stroke limited by streak artifact.  Echocardiogram  The cavity size was normal. Wall thickness was increased in a pattern of mild LVH. Systolic function was normal. The estimated ejection fraction was in the range of 50% to 55%.   Carotid Doppler  No ICA stenosis   EKG  normal EKG, normal sinus rhythm, unchanged from previous tracings, sinus bradycardia.   Therapy Recommendations SNF  Physical Exam  ---pleasant middle-aged African American lady not in distress.Awake alert. Afebrile. Head is nontraumatic. Neck is supple without bruit. Hearing is normal. Cardiac exam no murmur or gallop. Lungs are clear to auscultation. Distal pulses are well felt.  Neurological exam  :Awake  Alert oriented x 3. Normal speech and language.eye movements full without nystagmus. Face symmetric. Tongue midline. Normal strength, tone, reflexes and coordination.left hemibody sensation with splitting the midline including forehead.. Gait deferred.  ASSESSMENT [name] is a 66 y.o. female with a left sided weakness,  stroke workup in progress. On aspirin 81 mg orally every day prior to admission. Now on clopidogrel 75 mg orally every day for secondary stroke prevention.  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 -HTN -Diabetes Mellitus -Obesity -chest pain   Hospital day # 2  TREATMENT/PLAN  -She was on baby asa 81mg  at home, but now on clopidogrel 75 mg orally daily.  From Neurological standpoint, no antiplatelets required. Resume baby asa 81mg  daily if indicated from medical standpoint. No clinical indication to upgrade to plavix.  -Cardiac myoview in am -aggressive risk factor management  Stroke Service will sign off. Follow up with Dr. Pearlean Brownie in 2 months.  Guy Franco, PAC,  MBA, MHA [facility] Pager:  [phone] 05/07/2012 8:26 AM  Scribe for Dr. Delia Heady, Stroke Center Medical Director. He has personally reviewed chart, pertinent data, examined the patient and developed the plan of care. Pager:  [phone]

## 2012-07-01 ENCOUNTER — Emergency Department (HOSPITAL_COMMUNITY)
Admission: EM | Admit: 2012-07-01 | Discharge: 2012-07-01 | Disposition: A | Discharge status: Home / Self Care | Payer: Medicare Other | Attending: Emergency Medicine | Admitting: Emergency Medicine

## 2012-07-01 ENCOUNTER — Encounter (HOSPITAL_COMMUNITY): Payer: Self-pay | Admitting: *Deleted

## 2012-07-01 ENCOUNTER — Emergency Department (HOSPITAL_COMMUNITY): Payer: Medicare Other

## 2012-07-01 DIAGNOSIS — E119 Type 2 diabetes mellitus without complications: Secondary | ICD-10-CM | POA: Insufficient documentation

## 2012-07-01 DIAGNOSIS — Z6830 Body mass index (BMI) 30.0-30.9, adult: Secondary | ICD-10-CM | POA: Insufficient documentation

## 2012-07-01 DIAGNOSIS — K219 Gastro-esophageal reflux disease without esophagitis: Secondary | ICD-10-CM | POA: Insufficient documentation

## 2012-07-01 DIAGNOSIS — E669 Obesity, unspecified: Secondary | ICD-10-CM | POA: Insufficient documentation

## 2012-07-01 DIAGNOSIS — Z7982 Long term (current) use of aspirin: Secondary | ICD-10-CM | POA: Insufficient documentation

## 2012-07-01 DIAGNOSIS — I10 Essential (primary) hypertension: Secondary | ICD-10-CM | POA: Insufficient documentation

## 2012-07-01 DIAGNOSIS — Z87891 Personal history of nicotine dependence: Secondary | ICD-10-CM | POA: Insufficient documentation

## 2012-07-01 DIAGNOSIS — Z79899 Other long term (current) drug therapy: Secondary | ICD-10-CM | POA: Insufficient documentation

## 2012-07-01 DIAGNOSIS — R42 Dizziness and giddiness: Secondary | ICD-10-CM

## 2012-07-01 LAB — CBC
MCH: 30.1 pg (ref 26.0–34.0)
Platelets: 251 10*3/uL (ref 150–400)
RBC: 4.25 MIL/uL (ref 3.87–5.11)
WBC: 6.1 10*3/uL (ref 4.0–10.5)

## 2012-07-01 LAB — BASIC METABOLIC PANEL
CO2: 26 mEq/L (ref 19–32)
Calcium: 9.5 mg/dL (ref 8.4–10.5)
Creatinine, Ser: 1.06 mg/dL (ref 0.50–1.10)
Glucose, Bld: 170 mg/dL — ABNORMAL HIGH (ref 70–99)

## 2012-07-01 LAB — URINALYSIS, ROUTINE W REFLEX MICROSCOPIC
Bilirubin Urine: NEGATIVE
Glucose, UA: NEGATIVE mg/dL
Hgb urine dipstick: NEGATIVE
Ketones, ur: NEGATIVE mg/dL
Protein, ur: NEGATIVE mg/dL

## 2012-07-01 LAB — CARDIAC PANEL(CRET KIN+CKTOT+MB+TROPI): Relative Index: INVALID (ref 0.0–2.5)

## 2012-07-01 MED ORDER — ONDANSETRON HCL 4 MG/2ML IJ SOLN
4.0000 mg | Freq: Once | INTRAMUSCULAR | Status: AC
  Administered 2012-07-01: 4 mg via INTRAVENOUS
  Filled 2012-07-01: qty 2

## 2012-07-01 MED ORDER — LORAZEPAM 2 MG/ML IJ SOLN
1.0000 mg | INTRAMUSCULAR | Status: AC
  Administered 2012-07-01: 1 mg via INTRAVENOUS
  Filled 2012-07-01: qty 1

## 2012-07-01 MED ORDER — POTASSIUM CHLORIDE CRYS ER 20 MEQ PO TBCR
40.0000 meq | EXTENDED_RELEASE_TABLET | Freq: Once | ORAL | Status: AC
  Administered 2012-07-01: 40 meq via ORAL
  Filled 2012-07-01: qty 2

## 2012-07-01 MED ORDER — MECLIZINE HCL 50 MG PO TABS: 50.0000 mg | ORAL_TABLET | Freq: Three times a day (TID) | ORAL | Status: AC | PRN

## 2012-07-01 MED ORDER — MECLIZINE HCL 25 MG PO TABS
25.0000 mg | ORAL_TABLET | Freq: Once | ORAL | Status: AC
  Administered 2012-07-01: 25 mg via ORAL
  Filled 2012-07-01: qty 1

## 2012-07-01 NOTE — ED Provider Notes (Signed)
7:40 PM patient awake alert Glasgow Coma Score 15 becomes mildly vertiginous upon standing walks with minimal assistance feels ready to go home. Her son will stay with her today. Plan prescription meclizine, followup Dr. Redmond School if not improved in 3-4 days.,bp recheck Diagnosis #1 vertigo #2 hypokalemia #3 hyperglycemia  Doug Sou, MD 07/01/12 [phone]

## 2012-07-01 NOTE — ED Provider Notes (Cosign Needed)
History     CSN: [id number]  Arrival date & time 07/01/12  0418   First MD Initiated Contact with Patient 07/01/12 0430      Chief Complaint  Patient presents with  . Dizziness    (Consider location/radiation/quality/duration/timing/severity/associated sxs/prior treatment) HPI 66 year old female presents to emergency room with complaint of vertigo. Patient reports symptoms started either around 8:30 or 9:00 last night. Patient reports she was watching TV when she had acute onset of the feeling that the room was spinning around her. Patient became very nauseated with this. She has not had any vomiting. She denies any head trauma. She denies fatigability of her symptoms with staying still, no worsening with head movement. Patient reports she has to keep her eyes closed to help with the symptoms. No prior history of vertigo. Patient has history of remote CVA, recent admission in June for TIA-like symptoms. Patient reports she has some left-sided residual weakness from this event. Patient with history of diabetes, hypertension, reflux. Patient takes an aspirin. She denies any recent cold, no hearing loss, no headache, no visual changes.  Past Medical History  Diagnosis Date  . Hypertension   . Diabetes mellitus   . Obesity (BMI 35.0-39.9 without comorbidity)   . GERD (gastroesophageal reflux disease)   . Chest pain 2009    MV with no scar or ischemia, EF 65%    Past Surgical History  Procedure Date  . Back surgery   . Wisdom tooth extraction   . Cholecystectomy   . Temporomandibular joint surgery   . Abdominal hysterectomy 2006    Partial, one ovary left   . Cardiac catheterization 2004    [place], [place] per pt.    Family History  Problem Relation Age of Onset  . Heart disease Father   . Diabetes Maternal Grandmother   . Pancreatitis Mother     History  Substance Use Topics  . Smoking status: Former Smoker -- 1.0 packs/day for .5 years    Types: Cigarettes    Quit  date: 05/05/1969  . Smokeless tobacco: Never Used  . Alcohol Use: No    OB History    Grav Para Term Preterm Abortions TAB SAB Ect Mult Living   4 4        4      Obstetric Comments   Delivered 4 large babies vaginally      Review of Systems  All other systems reviewed and are negative.   than listed in history of present illness  Allergies  Codeine and Adhesive  Home Medications   Current Outpatient Rx  Name Route Sig Dispense Refill  . ASPIRIN 81 MG PO TABS Oral Take 81 mg by mouth daily.    Marland Kitchen BUTALBITAL-APAP-CAFFEINE 50-325-40 MG PO TABS Oral Take 1 tablet by mouth 2 (two) times daily as needed. For headache    . VITAMIN D 1000 UNITS PO TABS Oral Take 1,000 Units by mouth daily.    Marland Kitchen GLIPIZIDE 5 MG PO TABS Oral Take 10 mg by mouth daily.     Marland Kitchen HYDROCHLOROTHIAZIDE 12.5 MG PO CAPS Oral Take 12.5 mg by mouth daily.    Marland Kitchen PREVACID PO Oral Take 30 mg by mouth.     Marland Kitchen LISINOPRIL 2.5 MG PO TABS Oral Take 2.5 mg by mouth daily.      BP 152/98  Pulse 65  Temp 98.4 F (36.9 C) (Oral)  Resp 16  SpO2 96%  Physical Exam  Nursing note and vitals reviewed. Constitutional: She is  oriented to person, place, and time. She appears well-developed and well-nourished. She appears distressed (she keeps her eyes closed as much as possible, appears uncomfortable).  HENT:  Head: Normocephalic and atraumatic.  Right Ear: External ear normal.  Left Ear: External ear normal.  Nose: Nose normal.  Mouth/Throat: Oropharynx is clear and moist.  Eyes: Conjunctivae and EOM are normal. Pupils are equal, round, and reactive to light.  Neck: Normal range of motion. Neck supple. No JVD present. No tracheal deviation present. No thyromegaly present.  Cardiovascular: Normal rate, regular rhythm, normal heart sounds and intact distal pulses.  Exam reveals no gallop and no friction rub.   No murmur heard. Pulmonary/Chest: Effort normal and breath sounds normal. No stridor. No respiratory distress. She  has no wheezes. She has no rales. She exhibits no tenderness.  Abdominal: Soft. Bowel sounds are normal. She exhibits no distension and no mass. There is no tenderness. There is no rebound and no guarding.  Musculoskeletal: Normal range of motion. She exhibits no edema and no tenderness.  Lymphadenopathy:    She has no cervical adenopathy.  Neurological: She is alert and oriented to person, place, and time. She has normal reflexes. No cranial nerve deficit. She exhibits normal muscle tone. Coordination (she was in difficulties with finger-nose-finger and heel-to-shin, normal Romberg area patient noted to have a ataxic gait) abnormal.  Skin: Skin is warm and dry. No rash noted. No erythema. No pallor.  Psychiatric: She has a normal mood and affect. Her behavior is normal. Judgment and thought content normal.    ED Course  Procedures (including critical care time)   Labs Reviewed  CBC  URINALYSIS, ROUTINE W REFLEX MICROSCOPIC  BASIC METABOLIC PANEL  CARDIAC PANEL(CRET KIN+CKTOT+MB+TROPI)   No results found.   Date: 07/01/2012  Rate: 61  Rhythm: normal sinus rhythm  QRS Axis: normal  Intervals: normal  ST/T Wave abnormalities: nonspecific T wave changes  Conduction Disutrbances:nonspecific intraventricular conduction delay  Narrative Interpretation:   Old EKG Reviewed: unchanged    No diagnosis found.    MDM  66 year old female with acute vertigo, concern for posterior cerebellar infarct. Patient is not a TPA candidate as her symptoms started at least 8 hours ago. We'll get labs, EKG, and plan for CT than MRI.  5:44 AM Patient taken to MRI, CT had back up of patients and is available for her now but the better test is MRI.      7:08 AM Care passed to Dr Ethelda Chick awaiting MRI results  Olivia Mackie, MD 07/01/12 [phone]

## 2012-07-01 NOTE — ED Notes (Signed)
NAD noted at time of d/c home with family 

## 2012-07-01 NOTE — ED Notes (Signed)
Pt states she started feeling dizzy around 20:00-20:30 last night. Pt states nauseated, pt sitting with eyes closed. Pt CBG was 146 from home right before she came in. Pt BP was elevated at home as well.

## 2012-07-01 NOTE — ED Notes (Signed)
Patient transported to MRI 

## 2013-03-27 ENCOUNTER — Other Ambulatory Visit: Payer: Self-pay

## 2013-03-27 DIAGNOSIS — Z1231 Encounter for screening mammogram for malignant neoplasm of breast: Secondary | ICD-10-CM

## 2013-04-24 ENCOUNTER — Encounter (HOSPITAL_COMMUNITY): Payer: Self-pay | Admitting: Emergency Medicine

## 2013-04-24 ENCOUNTER — Emergency Department (HOSPITAL_COMMUNITY)
Admission: EM | Admit: 2013-04-24 | Discharge: 2013-04-24 | Disposition: A | Discharge status: Home / Self Care | Payer: Medicare Other | Source: Home / Self Care | Attending: Emergency Medicine | Admitting: Emergency Medicine

## 2013-04-24 ENCOUNTER — Emergency Department (INDEPENDENT_AMBULATORY_CARE_PROVIDER_SITE_OTHER): Payer: Medicare Other

## 2013-04-24 DIAGNOSIS — H6590 Unspecified nonsuppurative otitis media, unspecified ear: Secondary | ICD-10-CM

## 2013-04-24 DIAGNOSIS — H6592 Unspecified nonsuppurative otitis media, left ear: Secondary | ICD-10-CM

## 2013-04-24 DIAGNOSIS — J069 Acute upper respiratory infection, unspecified: Secondary | ICD-10-CM

## 2013-04-24 MED ORDER — AMOXICILLIN 875 MG PO TABS: 875.0000 mg | ORAL_TABLET | Freq: Two times a day (BID) | ORAL | Status: DC

## 2013-04-24 MED ORDER — BENZONATATE 200 MG PO CAPS: 200.0000 mg | ORAL_CAPSULE | Freq: Three times a day (TID) | ORAL | Status: DC | PRN

## 2013-04-24 MED ORDER — FLUTICASONE PROPIONATE 50 MCG/ACT NA SUSP: 2.0000 | Freq: Every day | NASAL | Status: DC

## 2013-04-24 NOTE — ED Provider Notes (Signed)
Medical screening examination/treatment/procedure(s) were performed by non-physician practitioner and as supervising physician I was immediately available for consultation/collaboration.  Raynald Blend, MD 04/24/13 1149

## 2013-04-24 NOTE — ED Provider Notes (Signed)
History     CSN: [id number]  Arrival date & time 04/24/13  1001   None     Chief Complaint  Patient presents with  . Cough    (Consider location/radiation/quality/duration/timing/severity/associated sxs/prior treatment) HPI Comments: Pt presents c/o cough for 2 weeks, now also getting pressure in her ear, tearing in her eyes, and pressure in the left side of her forehead.  She states the cough was originally dry but has become productive.  She is worried she may be developing pneumonia, she says she has gotten pneumonia before and it started similarly to this.  Denies chest pain, pleuritic pain, fever, chills, NVD, dizziness.  She has been taking claritin for this at the advice of her PCP which has not been helping her so far.    Patient is a 67 y.o. female presenting with cough.  Cough Associated symptoms: ear pain, eye discharge (clear) and sore throat   Associated symptoms: no chest pain, no chills, no fever, no myalgias, no rash, no rhinorrhea and no shortness of breath     Past Medical History  Diagnosis Date  . Hypertension   . Diabetes mellitus   . Obesity (BMI 35.0-39.9 without comorbidity)   . GERD (gastroesophageal reflux disease)   . Chest pain 2009    MV with no scar or ischemia, EF 65%    Past Surgical History  Procedure Laterality Date  . Back surgery    . Wisdom tooth extraction    . Cholecystectomy    . Temporomandibular joint surgery    . Abdominal hysterectomy  2006    Partial, one ovary left   . Cardiac catheterization  2004    [place], [place].; [place] per pt.    Family History  Problem Relation Age of Onset  . Heart disease Father   . Diabetes Maternal Grandmother   . Pancreatitis Mother     History  Substance Use Topics  . Smoking status: Former Smoker -- 1.00 packs/day for .5 years    Types: Cigarettes    Quit date: 05/05/1969  . Smokeless tobacco: Never Used  . Alcohol Use: No    OB History   Grav Para Term Preterm Abortions TAB SAB Ect  Mult Living   4 4        4      Obstetric Comments   Delivered 4 large babies vaginally      Review of Systems  Constitutional: Negative for fever, chills and fatigue.  HENT: Positive for ear pain, sore throat, sneezing and sinus pressure. Negative for congestion, rhinorrhea, trouble swallowing, voice change, postnasal drip and ear discharge.   Eyes: Positive for discharge (clear). Negative for visual disturbance.  Respiratory: Positive for cough. Negative for chest tightness and shortness of breath.   Cardiovascular: Negative for chest pain, palpitations and leg swelling.  Gastrointestinal: Negative for nausea, vomiting and abdominal pain.  Endocrine: Negative for polydipsia and polyuria.  Genitourinary: Negative for dysuria, urgency and frequency.  Musculoskeletal: Negative for myalgias and arthralgias.  Skin: Negative for rash.  Neurological: Negative for dizziness, weakness and light-headedness.    Allergies  Codeine and Adhesive  Home Medications   Current Outpatient Rx  Name  Route  Sig  Dispense  Refill  . LORATADINE PO   Oral   Take by mouth.         Marland Kitchen amoxicillin (AMOXIL) 875 MG tablet   Oral   Take 1 tablet (875 mg total) by mouth 2 (two) times daily.   14 tablet  0   . aspirin 81 MG tablet   Oral   Take 81 mg by mouth daily.         . benzonatate (TESSALON) 200 MG capsule   Oral   Take 1 capsule (200 mg total) by mouth 3 (three) times daily as needed for cough.   20 capsule   0   . butalbital-acetaminophen-caffeine (FIORICET, ESGIC) 50-325-40 MG per tablet   Oral   Take 1 tablet by mouth 2 (two) times daily as needed. For headache         . cholecalciferol (VITAMIN D) 1000 UNITS tablet   Oral   Take 1,000 Units by mouth daily.         . fluticasone (FLONASE) 50 MCG/ACT nasal spray   Nasal   Place 2 sprays into the nose daily.   1 g   2   . glipiZIDE (GLUCOTROL) 5 MG tablet   Oral   Take 10 mg by mouth daily.          .  hydrochlorothiazide (MICROZIDE) 12.5 MG capsule   Oral   Take 12.5 mg by mouth daily.         . Lansoprazole (PREVACID PO)   Oral   Take 30 mg by mouth.          Marland Kitchen lisinopril (PRINIVIL,ZESTRIL) 2.5 MG tablet   Oral   Take 2.5 mg by mouth daily.           BP 130/91  Pulse 102  Temp(Src) 98.2 F (36.8 C) (Oral)  Resp 18  SpO2 96%  Physical Exam  Nursing note and vitals reviewed. Constitutional: She is oriented to person, place, and time. Vital signs are normal. She appears well-developed and well-nourished. No distress.  HENT:  Head: Normocephalic and atraumatic.  Right Ear: Tympanic membrane is not erythematous. No middle ear effusion.  Left Ear: Tympanic membrane is erythematous. Tympanic membrane is not injected. A middle ear effusion is present.  Ears:  Eyes: EOM are normal. Pupils are equal, round, and reactive to light.  Cardiovascular: Normal rate, regular rhythm and normal heart sounds.  Exam reveals no gallop and no friction rub.   No murmur heard. Pulmonary/Chest: Effort normal and breath sounds normal. No respiratory distress. She has no wheezes. She has no rales.  Abdominal: Soft. There is no tenderness.  Neurological: She is alert and oriented to person, place, and time. She has normal strength.  Skin: Skin is warm and dry. She is not diaphoretic.  Psychiatric: She has a normal mood and affect. Her behavior is normal. Judgment normal.    ED Course  Procedures (including critical care time)  Labs Reviewed - No data to display Dg Chest 2 View  04/24/2013   *RADIOLOGY REPORT*  Clinical Data: Cough  CHEST - 2 VIEW  Comparison: 05/05/2012  Findings: Cardiomediastinal silhouette is stable.  No acute infiltrate or pleural effusion.  No pulmonary edema.  Bony thorax is stable. Tortuous thoracic aorta.  IMPRESSION: No active disease. No significant change.   Original Report Authenticated By: Natasha Mead, M.D.     1. URI, acute   2. Otitis media with effusion,  left       MDM  Will treat her symptomatically and also give amoxicillin that she can start in 3-4 days if not still improving    Will have her f/u with her PCP regarding the lesion in the ear.  Looks to be something like a very large comedone but at that size it  may need biopsy    Meds ordered this encounter  Medications  . LORATADINE PO    Sig: Take by mouth.  . fluticasone (FLONASE) 50 MCG/ACT nasal spray    Sig: Place 2 sprays into the nose daily.    Dispense:  1 g    Refill:  2  . benzonatate (TESSALON) 200 MG capsule    Sig: Take 1 capsule (200 mg total) by mouth 3 (three) times daily as needed for cough.    Dispense:  20 capsule    Refill:  0  . amoxicillin (AMOXIL) 875 MG tablet    Sig: Take 1 tablet (875 mg total) by mouth 2 (two) times daily.    Dispense:  14 tablet    Refill:  0           Graylon Good, PA-C 04/24/13 1121

## 2013-04-24 NOTE — ED Notes (Signed)
Cough and phlegm in throat, runny eyes for 2 weeks.  Patient concerned she has pneumonia, history of the same

## 2013-04-26 ENCOUNTER — Emergency Department (HOSPITAL_COMMUNITY)
Admission: EM | Admit: 2013-04-26 | Discharge: 2013-04-26 | Disposition: A | Discharge status: Home / Self Care | Payer: Medicare Other | Attending: Emergency Medicine | Admitting: Emergency Medicine

## 2013-04-26 ENCOUNTER — Encounter (HOSPITAL_COMMUNITY): Payer: Self-pay

## 2013-04-26 ENCOUNTER — Emergency Department (INDEPENDENT_AMBULATORY_CARE_PROVIDER_SITE_OTHER)
Admission: EM | Admit: 2013-04-26 | Discharge: 2013-04-26 | Disposition: A | Discharge status: Other Acute Inpatient Hospital | Payer: Medicare Other | Source: Home / Self Care | Attending: Emergency Medicine | Admitting: Emergency Medicine

## 2013-04-26 ENCOUNTER — Emergency Department (HOSPITAL_COMMUNITY): Payer: Medicare Other

## 2013-04-26 ENCOUNTER — Encounter (HOSPITAL_COMMUNITY): Payer: Self-pay | Admitting: Radiology

## 2013-04-26 DIAGNOSIS — Z9861 Coronary angioplasty status: Secondary | ICD-10-CM | POA: Insufficient documentation

## 2013-04-26 DIAGNOSIS — K219 Gastro-esophageal reflux disease without esophagitis: Secondary | ICD-10-CM | POA: Insufficient documentation

## 2013-04-26 DIAGNOSIS — R1310 Dysphagia, unspecified: Secondary | ICD-10-CM | POA: Insufficient documentation

## 2013-04-26 DIAGNOSIS — J029 Acute pharyngitis, unspecified: Secondary | ICD-10-CM | POA: Insufficient documentation

## 2013-04-26 DIAGNOSIS — Z8679 Personal history of other diseases of the circulatory system: Secondary | ICD-10-CM | POA: Insufficient documentation

## 2013-04-26 DIAGNOSIS — E669 Obesity, unspecified: Secondary | ICD-10-CM | POA: Insufficient documentation

## 2013-04-26 DIAGNOSIS — M542 Cervicalgia: Secondary | ICD-10-CM | POA: Insufficient documentation

## 2013-04-26 DIAGNOSIS — M436 Torticollis: Secondary | ICD-10-CM

## 2013-04-26 DIAGNOSIS — R05 Cough: Secondary | ICD-10-CM | POA: Insufficient documentation

## 2013-04-26 DIAGNOSIS — Z9889 Other specified postprocedural states: Secondary | ICD-10-CM | POA: Insufficient documentation

## 2013-04-26 DIAGNOSIS — R059 Cough, unspecified: Secondary | ICD-10-CM | POA: Insufficient documentation

## 2013-04-26 DIAGNOSIS — R000 Tachycardia, unspecified: Secondary | ICD-10-CM | POA: Insufficient documentation

## 2013-04-26 DIAGNOSIS — R509 Fever, unspecified: Secondary | ICD-10-CM | POA: Insufficient documentation

## 2013-04-26 DIAGNOSIS — Z79899 Other long term (current) drug therapy: Secondary | ICD-10-CM | POA: Insufficient documentation

## 2013-04-26 DIAGNOSIS — Z7982 Long term (current) use of aspirin: Secondary | ICD-10-CM | POA: Insufficient documentation

## 2013-04-26 DIAGNOSIS — I10 Essential (primary) hypertension: Secondary | ICD-10-CM | POA: Insufficient documentation

## 2013-04-26 DIAGNOSIS — Z87891 Personal history of nicotine dependence: Secondary | ICD-10-CM | POA: Insufficient documentation

## 2013-04-26 DIAGNOSIS — E119 Type 2 diabetes mellitus without complications: Secondary | ICD-10-CM | POA: Insufficient documentation

## 2013-04-26 LAB — CBC WITH DIFFERENTIAL/PLATELET
Basophils Absolute: 0.1 10*3/uL (ref 0.0–0.1)
HCT: 41.4 % (ref 36.0–46.0)
Hemoglobin: 14.8 g/dL (ref 12.0–15.0)
Lymphocytes Relative: 29 % (ref 12–46)
Monocytes Absolute: 0.7 10*3/uL (ref 0.1–1.0)
Monocytes Relative: 7 % (ref 3–12)
Neutro Abs: 6 10*3/uL (ref 1.7–7.7)
RDW: 14 % (ref 11.5–15.5)
WBC: 9.5 10*3/uL (ref 4.0–10.5)

## 2013-04-26 LAB — POCT I-STAT, CHEM 8
BUN: 13 mg/dL (ref 6–23)
Calcium, Ion: 1.22 mmol/L (ref 1.13–1.30)
Chloride: 104 mEq/L (ref 96–112)

## 2013-04-26 MED ORDER — ONDANSETRON HCL 4 MG PO TABS: 4.0000 mg | ORAL_TABLET | Freq: Three times a day (TID) | ORAL | Status: DC | PRN

## 2013-04-26 MED ORDER — METHOCARBAMOL 500 MG PO TABS: 500.0000 mg | ORAL_TABLET | Freq: Two times a day (BID) | ORAL | Status: DC | PRN

## 2013-04-26 MED ORDER — SODIUM CHLORIDE 0.9 % IV BOLUS (SEPSIS)
500.0000 mL | Freq: Once | INTRAVENOUS | Status: AC
  Administered 2013-04-26: 500 mL via INTRAVENOUS

## 2013-04-26 MED ORDER — HYDROMORPHONE HCL PF 1 MG/ML IJ SOLN
1.0000 mg | Freq: Once | INTRAMUSCULAR | Status: AC
  Administered 2013-04-26: 1 mg via INTRAMUSCULAR

## 2013-04-26 MED ORDER — HYDROMORPHONE HCL PF 1 MG/ML IJ SOLN
INTRAMUSCULAR | Status: AC
  Filled 2013-04-26: qty 1

## 2013-04-26 MED ORDER — IOHEXOL 300 MG/ML  SOLN
80.0000 mL | Freq: Once | INTRAMUSCULAR | Status: AC | PRN
  Administered 2013-04-26: 80 mL via INTRAVENOUS

## 2013-04-26 MED ORDER — OXYCODONE-ACETAMINOPHEN 5-325 MG PO TABS: 1.0000 | ORAL_TABLET | ORAL | Status: DC | PRN

## 2013-04-26 MED ORDER — ONDANSETRON HCL 4 MG/2ML IJ SOLN
INTRAMUSCULAR | Status: AC
  Filled 2013-04-26: qty 2

## 2013-04-26 MED ORDER — FENTANYL CITRATE 0.05 MG/ML IJ SOLN
50.0000 ug | Freq: Once | INTRAMUSCULAR | Status: AC
  Administered 2013-04-26: 50 ug via INTRAVENOUS
  Filled 2013-04-26: qty 2

## 2013-04-26 MED ORDER — ONDANSETRON HCL 4 MG/2ML IJ SOLN
4.0000 mg | Freq: Once | INTRAMUSCULAR | Status: AC
  Administered 2013-04-26: 4 mg via INTRAMUSCULAR

## 2013-04-26 NOTE — ED Notes (Signed)
Contacted CT about wait time. Pt made aware the she is next to be taken to CT. Apologized for delay.

## 2013-04-26 NOTE — ED Notes (Signed)
C/o phlegm for 2 weeks, seen 2 days ago [facility] and Rx antibiotics, no better, hard to open mouth post neck painful to light palpation; hypertensive, pain "10"

## 2013-04-26 NOTE — ED Notes (Signed)
Pt reports 10/10 right sided neck pain x 1 week that is progressively worsening. PT reports cough x 3 weeks with fever.

## 2013-04-26 NOTE — ED Provider Notes (Signed)
History     CSN: [id number]  Arrival date & time 04/26/13  1602   First MD Initiated Contact with Patient 04/26/13 1605      Chief Complaint  Patient presents with  . Muscle Pain    (Consider location/radiation/quality/duration/timing/severity/associated sxs/prior treatment) HPI Comments: 67 year old female with a history of diabetes, hypertension who presents with a complaint of neck pain. According to the medical record the patient was seen at urgent care, and placed on amoxicillin for URI symptoms, had no improvement but has gradually worsened. She now has complaints of right-sided neck pain, is unable to move her neck from side to side and is unable to open her mouth without severe pain radiating to her neck in the posterior neck on the right. She has subjective fevers, measuring up to 100.2 last night, intermittent mild cough, persistent sore throat and difficulty swallowing and bilateral ear congestion. She denies nausea or vomiting but has had very little to eat because of difficulty swallowing. The symptoms are gradually worsening, they are not severe. She was seen at urgent care today and transferred down for further evaluation. She was given intramuscular Dilaudid prior to transfer  The history is provided by the patient.    Past Medical History  Diagnosis Date  . Hypertension   . Diabetes mellitus   . Obesity (BMI 35.0-39.9 without comorbidity)   . GERD (gastroesophageal reflux disease)   . Chest pain 2009    MV with no scar or ischemia, EF 65%    Past Surgical History  Procedure Laterality Date  . Back surgery    . Wisdom tooth extraction    . Cholecystectomy    . Temporomandibular joint surgery    . Abdominal hysterectomy  2006    Partial, one ovary left   . Cardiac catheterization  2004    [place], [place] per pt.    Family History  Problem Relation Age of Onset  . Heart disease Father   . Diabetes Maternal Grandmother   . Pancreatitis Mother      History  Substance Use Topics  . Smoking status: Former Smoker -- 1.00 packs/day for .5 years    Types: Cigarettes    Quit date: 05/05/1969  . Smokeless tobacco: Never Used  . Alcohol Use: No    OB History   Grav Para Term Preterm Abortions TAB SAB Ect Mult Living   4 4        4      Obstetric Comments   Delivered 4 large babies vaginally      Review of Systems  Constitutional: Positive for fever. Negative for chills.  HENT: Positive for sore throat, trouble swallowing, neck pain and neck stiffness.   Eyes: Negative for visual disturbance.  Respiratory: Positive for cough. Negative for shortness of breath.   Cardiovascular: Negative for chest pain.  Gastrointestinal: Negative for nausea, vomiting, abdominal pain and diarrhea.  Genitourinary: Negative for dysuria and frequency.  Musculoskeletal: Negative for back pain.  Skin: Negative for rash.  Neurological: Negative for weakness, numbness and headaches.  Hematological: Negative for adenopathy.  Psychiatric/Behavioral: Negative for behavioral problems.    Allergies  Codeine and Adhesive  Home Medications   Current Outpatient Rx  Name  Route  Sig  Dispense  Refill  . amoxicillin (AMOXIL) 875 MG tablet   Oral   Take 1 tablet (875 mg total) by mouth 2 (two) times daily.   14 tablet   0   . aspirin EC 81 MG tablet   Oral  Take 81 mg by mouth daily.         . benzonatate (TESSALON) 200 MG capsule   Oral   Take 1 capsule (200 mg total) by mouth 3 (three) times daily as needed for cough.   20 capsule   0   . butalbital-acetaminophen-caffeine (FIORICET, ESGIC) 50-325-40 MG per tablet   Oral   Take 1 tablet by mouth 2 (two) times daily as needed. For headache         . cholecalciferol (VITAMIN D) 1000 UNITS tablet   Oral   Take 1,000 Units by mouth daily.         . fluticasone (FLONASE) 50 MCG/ACT nasal spray   Nasal   Place 2 sprays into the nose daily as needed for rhinitis or allergies.          Marland Kitchen glipiZIDE (GLUCOTROL) 5 MG tablet   Oral   Take 10 mg by mouth daily.          . Lansoprazole (PREVACID PO)   Oral   Take 30 mg by mouth daily.          Marland Kitchen lisinopril-hydrochlorothiazide (PRINZIDE,ZESTORETIC) 10-12.5 MG per tablet   Oral   Take 1 tablet by mouth daily.         . methocarbamol (ROBAXIN) 500 MG tablet   Oral   Take 1 tablet (500 mg total) by mouth 2 (two) times daily as needed.   20 tablet   0   . ondansetron (ZOFRAN) 4 MG tablet   Oral   Take 1 tablet (4 mg total) by mouth every 8 (eight) hours as needed for nausea.   10 tablet   0   . oxyCODONE-acetaminophen (PERCOCET) 5-325 MG per tablet   Oral   Take 1 tablet by mouth every 4 (four) hours as needed for pain.   20 tablet   0     BP 136/84  Pulse 88  Temp(Src) 98.3 F (36.8 C) (Oral)  SpO2 98%  Physical Exam  Nursing note and vitals reviewed. Constitutional: She appears well-developed and well-nourished.  Uncomfortable appearing  HENT:  Head: Normocephalic and atraumatic.  Trismus present, 1-2 cm opening, mucous membranes appear moist, pharynx is not visualized  Eyes: Conjunctivae and EOM are normal. Pupils are equal, round, and reactive to light. Right eye exhibits no discharge. Left eye exhibits no discharge. No scleral icterus.  Neck: No JVD present. No thyromegaly present.  Torticollis present, patient is unable to turn her neck from the left or the right, there is significant tenderness to palpation on the right side of the neck both laterally and posteriorly, no lymphadenopathy is present  Cardiovascular: Regular rhythm, normal heart sounds and intact distal pulses.  Exam reveals no gallop and no friction rub.   No murmur heard. Mild tachycardia  Pulmonary/Chest: Effort normal and breath sounds normal. No respiratory distress. She has no wheezes. She has no rales.  Abdominal: Soft. Bowel sounds are normal. She exhibits no distension and no mass. There is no tenderness.   Musculoskeletal: Normal range of motion. She exhibits tenderness (to the right side of the neck). She exhibits no edema.  Lymphadenopathy:    She has no cervical adenopathy.  Neurological: She is alert. Coordination normal.  Skin: Skin is warm and dry. No rash noted. No erythema.  Psychiatric: She has a normal mood and affect. Her behavior is normal.    ED Course  Procedures (including critical care time)  Labs Reviewed  POCT I-STAT, CHEM 8 -  Abnormal; Notable for the following:    Glucose, Bld 130 (*)    Hemoglobin 15.3 (*)    All other components within normal limits  CBC WITH DIFFERENTIAL   Ct Soft Tissue Neck W Contrast  04/26/2013   *RADIOLOGY REPORT*  Clinical Data: 67 year old female with right greater than left throat pain and difficulty swallowing.  Cough.  CT NECK WITH CONTRAST  Technique:  Multidetector CT imaging of the neck was performed with intravenous contrast.  Contrast: 80mL OMNIPAQUE IOHEXOL 300 MG/ML  SOLN  Comparison: Brain MRI 07/01/2012.  Findings: Negative lung apices except for mild dependent atelectasis.  No superior mediastinal lymphadenopathy.  Diffuse proximal great vessel and visualized aorta dolichoectasia. Major vascular structures in the neck including both internal jugular veins are patent.  Sub centimeter left thyroid hypodense nodule of doubtful significance.  Larynx within normal limits.  Hypopharynx within normal limits.  Oropharynx and nasopharynx within normal limits. Small retropharyngeal lymph nodes are within normal limits. Parapharyngeal and retropharyngeal spaces are within normal limits. Sublingual space, submandibular glands and parotid glands are within normal limits.  Negative visualized brain parenchyma and orbit soft tissues.  Bilateral cervical lymph nodes are within normal limits and appear symmetric.  Chronic right maxillary sinusitis.  Inspissated partially calcified secretions and mucoperiosteal thickening.  Postoperative changes to the  anterior maxillary walls bilaterally.  Small fluid level in the right sphenoid sinus.  Scattered mostly anterior ethmoid sinus opacification.  Bilateral frontal sinus air-fluid levels.  Left maxillary sinus is largely clear.  Mastoids and tympanic cavities are clear.  Postoperative changes to the mandible symphysis.  Degenerative changes in the cervical spine. No acute osseous abnormality identified.  IMPRESSION: 1.  Acute-on-chronic paranasal sinusitis.  Relative sparing of the left maxillary and sphenoid sinuses. 2.  Otherwise no acute findings in the neck and negative neck soft tissues. 3.  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 4.  Remote postoperative changes to the maxilla and mandible.   Original Report Authenticated By: Erskine Speed, M.D.     1. Torticollis, unspecified       MDM  At this time the patient has a normal mental status, she is afebrile and has a borderline tachycardia. She will need a CT scan to further evaluate her neck and she does have severe tenderness on the right side of her neck and difficulty opening her mouth or turning her head from side to side. Labs have been drawn, discussed with the radiologist regarding appropriate imaging who agrees with a CT scan of the neck with venous imaging to rule out any kind of septic thrombophlebitis of the internal jugular.   CT scan reviewed, no signs of infection, no signs of internal jugular thrombosis, no leukocytosis, no fever. The patient has improved slightly with the medications given, she is amenable to discharge and does not appear to be toxic, she is able to speak full sentences, able to open her mouth to communicate and to take oral fluids and food. She will be discharged with medications for comfort, encouraged to followup with her family Dr.  The patient is aware of her findings   Meds given in ED:  Medications  sodium chloride 0.9 % bolus 500 mL (0 mLs Intravenous Stopped 04/26/13 1814)  fentaNYL  (SUBLIMAZE) injection 50 mcg (50 mcg Intravenous Given 04/26/13 1812)  iohexol (OMNIPAQUE) 300 MG/ML solution 80 mL (80 mLs Intravenous Contrast Given 04/26/13 1837)    New Prescriptions   METHOCARBAMOL (ROBAXIN) 500 MG TABLET    Take  1 tablet (500 mg total) by mouth 2 (two) times daily as needed.   ONDANSETRON (ZOFRAN) 4 MG TABLET    Take 1 tablet (4 mg total) by mouth every 8 (eight) hours as needed for nausea.   OXYCODONE-ACETAMINOPHEN (PERCOCET) 5-325 MG PER TABLET    Take 1 tablet by mouth every 4 (four) hours as needed for pain.         Vida Roller, MD 04/26/13 2008

## 2013-04-26 NOTE — ED Provider Notes (Signed)
Chief Complaint:   Chief Complaint  Patient presents with  . Oral Swelling    History of Present Illness:   [name] [name] is a 67 year old female who has had a three-week history of cough and a one-week history of sore throat. She came here 3 days ago with these symptoms. A chest x-ray was normal. She was placed on amoxicillin. Since then she feels much worse. She developed headache, neck pain, and trismus. Then headache is on the right side and is rated 10 over 10 in intensity. The neck pain is actually worse than the headache and she states this is the worst pain she ever had in her life. She has pain to touch and with movement. There is no swelling or mass. No radiation of pain down her arms, numbness, tingling, or weakness in the upper extremities. She has trouble opening her mouth. She also has trouble swallowing and it feels like she's got a little trouble breathing. She feels chilled and had a temperature 100.2 last night.  Review of Systems:  Other than noted above, the patient denies any of the following symptoms: Systemic:  No fevers, chills, sweats, weight loss or gain, fatigue, or tiredness. Eye:  No redness or discharge. ENT:  No ear pain, drainage, headache, nasal congestion, drainage, sinus pressure, difficulty swallowing, or sore throat. Neck:  No neck pain or swollen glands. Lungs:  No cough, sputum production, hemoptysis, wheezing, chest tightness, shortness of breath or chest pain. GI:  No abdominal pain, nausea, vomiting or diarrhea.  PMFSH:  Past medical history, family history, social history, meds, and allergies were reviewed. She is allergic to codeine. She takes Prevacid, glipizide, chlorothiazide, and vitamin D. She has diabetes, hypertension, gastroesophageal reflux disease, and history of a CVA.  Physical Exam:   Vital signs:  BP 166/105  Pulse 103  Temp(Src) 98.2 F (36.8 C) (Oral)  Resp 20  SpO2 98% General:  Alert and oriented. In marked distress due to her  head and neck pain. She's sitting in a wheelchair, wrapped up a blanket, is unable to open her mouth, and unable to move her neck.  Skin warm and dry. Eye:  No conjunctival injection or drainage. Lids were normal. ENT:  TMs and canals were normal, without erythema or inflammation.  Nasal mucosa was clear and uncongested, without drainage.  Mucous membranes were moist.  She has trismus and is unable to open her mouth more than about a centimeter. Pharynx was not well seen.   Neck:  Supple, no adenopathy, but extremely tender to palpation even just light touch. There are no masses. Lungs:  No respiratory distress.  Lungs were clear to auscultation, without wheezes, rales or rhonchi.  Breath sounds were clear and equal bilaterally.  Heart:  Regular rhythm, without gallops, murmers or rubs. Skin:  Clear, warm, and dry, without rash or lesions.  Radiology:  Dg Chest 2 View  04/24/2013   *RADIOLOGY REPORT*  Clinical Data: Cough  CHEST - 2 VIEW  Comparison: 05/05/2012  Findings: Cardiomediastinal silhouette is stable.  No acute infiltrate or pleural effusion.  No pulmonary edema.  Bony thorax is stable. Tortuous thoracic aorta.  IMPRESSION: No active disease. No significant change.   Original Report Authenticated By: Natasha Mead, M.D.    Course in Urgent Care Center:   For severe pain she was given Dilaudid 1 mg IM and Zofran 4 mg IM.  Assessment:  The encounter diagnosis was Neck pain.  Concerning for infectious complications as a throat such as  posterior pharyngeal abscess, peritonsillar abscess, or Lemierre's  syndrome.  Plan:   1.  The following meds were prescribed:   New Prescriptions   No medications on file   2.  The patient was transferred to the emergency department via shuttle in stable condition.  Medical Decision Making:  67 year old female has 3 week history of cough and sore throat.  Was seen here 2 days ago, CXR was normal and was given amoxicillin.  Since then she has worse sore  throat, severe neck pain, trismus, headache and fever.  On exam I cannot see her throat since she is unable to open her mouth, but her neck is very tender both to touch and to movement.  Presentation is concerning for Lemierre's syndrome (septic thrombophlebitis of Jugular Veins) or peri tonsillar abscess.  I think she needs a soft tissue neck CT.     Reuben Likes, MD 04/26/13 [phone]

## 2013-05-02 ENCOUNTER — Ambulatory Visit: Payer: Medicare Other

## 2013-05-06 ENCOUNTER — Ambulatory Visit
Admission: RE | Admit: 2013-05-06 | Discharge: 2013-05-06 | Disposition: A | Discharge status: Home / Self Care | Payer: Medicare Other | Source: Ambulatory Visit

## 2013-05-06 DIAGNOSIS — Z1231 Encounter for screening mammogram for malignant neoplasm of breast: Secondary | ICD-10-CM

## 2013-06-18 ENCOUNTER — Ambulatory Visit (INDEPENDENT_AMBULATORY_CARE_PROVIDER_SITE_OTHER): Payer: Medicare Other | Admitting: Obstetrics & Gynecology

## 2013-06-18 ENCOUNTER — Encounter: Payer: Self-pay | Admitting: Obstetrics & Gynecology

## 2013-06-18 VITALS — BP 132/89 | HR 73 | Temp 99.1°F | Ht 63.0 in | Wt 193.0 lb

## 2013-06-18 DIAGNOSIS — N8111 Cystocele, midline: Secondary | ICD-10-CM

## 2013-06-18 DIAGNOSIS — IMO0002 Reserved for concepts with insufficient information to code with codable children: Secondary | ICD-10-CM

## 2013-06-18 MED ORDER — ESTRADIOL 0.1 MG/GM VA CREA: TOPICAL_CREAM | VAGINAL | Status: DC

## 2013-06-18 NOTE — Progress Notes (Signed)
  Subjective:    Patient ID: [name] [name], female    DOB: [birth year], 67 y.o.   MRN: [id number]  HPI 67 yo lady with a grade 4 cystocele. She took home a #5 ring pessary 2 months ago and she was very happy with it. She accidentally threw it out with the paper towel covering it. She also ran out of her vaginal estrogen.   Review of Systems     Objective:   Physical Exam        Assessment & Plan:  I have given her another #5 ring pessary and another prescription for vaginal cream 2 times per week.

## 2013-07-29 ENCOUNTER — Emergency Department (INDEPENDENT_AMBULATORY_CARE_PROVIDER_SITE_OTHER): Payer: Medicare Other

## 2013-07-29 ENCOUNTER — Encounter (HOSPITAL_COMMUNITY): Payer: Self-pay | Admitting: Emergency Medicine

## 2013-07-29 ENCOUNTER — Emergency Department (HOSPITAL_COMMUNITY)
Admission: EM | Admit: 2013-07-29 | Discharge: 2013-07-29 | Disposition: A | Discharge status: Home / Self Care | Payer: Medicare Other | Source: Home / Self Care | Attending: Emergency Medicine | Admitting: Emergency Medicine

## 2013-07-29 DIAGNOSIS — J209 Acute bronchitis, unspecified: Secondary | ICD-10-CM

## 2013-07-29 MED ORDER — BECLOMETHASONE DIPROPIONATE 80 MCG/ACT IN AERS: 2.0000 | INHALATION_SPRAY | Freq: Two times a day (BID) | RESPIRATORY_TRACT | Status: DC

## 2013-07-29 MED ORDER — BENZONATATE 200 MG PO CAPS: 200.0000 mg | ORAL_CAPSULE | Freq: Three times a day (TID) | ORAL | Status: DC | PRN

## 2013-07-29 MED ORDER — FLUTICASONE PROPIONATE 50 MCG/ACT NA SUSP: 2.0000 | Freq: Every day | NASAL | Status: DC

## 2013-07-29 MED ORDER — ALBUTEROL SULFATE HFA 108 (90 BASE) MCG/ACT IN AERS: 1.0000 | INHALATION_SPRAY | Freq: Four times a day (QID) | RESPIRATORY_TRACT | Status: AC | PRN

## 2013-07-29 MED ORDER — DOXYCYCLINE HYCLATE 100 MG PO TABS: 100.0000 mg | ORAL_TABLET | Freq: Two times a day (BID) | ORAL | Status: DC

## 2013-07-29 NOTE — ED Notes (Signed)
Pt c/o productive cough onset 2 weeks Sxs include: nauseas Denies: f/v/d, runny nose, congestion Alert w/no signs of acute distress.

## 2013-07-29 NOTE — ED Provider Notes (Signed)
Chief Complaint:   Chief Complaint  Patient presents with  . Cough    History of Present Illness:   [name] [name] is a 67 year old female who's had a two-week history of a cough productive of clear to white to yellow sputum with small amounts of blood, chest tightness, nausea, abdominal pain, sore throat, nasal congestion, sneezing, and headache. She denies any fever, chills, sweats, chest pain, vomiting, diarrhea, or ear pain.  Review of Systems:  Other than noted above, the patient denies any of the following symptoms: Systemic:  No fevers, chills, sweats, weight loss or gain, fatigue, or tiredness. Eye:  No redness or discharge. ENT:  No ear pain, drainage, headache, nasal congestion, drainage, sinus pressure, difficulty swallowing, or sore throat. Neck:  No neck pain or swollen glands. Lungs:  No cough, sputum production, hemoptysis, wheezing, chest tightness, shortness of breath or chest pain. GI:  No abdominal pain, nausea, vomiting or diarrhea.  PMFSH:  Past medical history, family history, social history, meds, and allergies were reviewed. She is allergic to codeine. She takes a list of medications including aspirin, lisinopril/HCTZ, Fioricet, estradiol, Flonase, glipizide, Prevacid, Robaxin, Zofran, and Percocet. She has diabetes, hypertension, and gastroesophageal reflux.  Physical Exam:   Vital signs:  BP 146/100  Pulse 80  Temp(Src) 98.9 F (37.2 C) (Oral)  Resp 16 General:  Alert and oriented.  In no distress.  Skin warm and dry. Eye:  No conjunctival injection or drainage. Lids were normal. ENT:  TMs and canals were normal, without erythema or inflammation.  Nasal mucosa was clear and uncongested, without drainage.  Mucous membranes were moist.  Pharynx was clear with no exudate or drainage.  There were no oral ulcerations or lesions. Neck:  Supple, no adenopathy, tenderness or mass. Lungs:  No respiratory distress.  Lungs were clear to auscultation, without wheezes, rales  or rhonchi.  Breath sounds were clear and equal bilaterally.  Heart:  Regular rhythm, without gallops, murmers or rubs. Skin:  Clear, warm, and dry, without rash or lesions.  Radiology:  Dg Chest 2 View  07/29/2013   *RADIOLOGY REPORT*  Clinical Data: Productive cough  CHEST - 2 VIEW  Comparison: 04/24/2013  Findings: Cardiomediastinal silhouette is stable.  No acute infiltrate or pleural effusion.  No pulmonary edema.  Tortuous thoracic aorta again noted.  IMPRESSION: No active disease.  No significant change.   Original Report Authenticated By: Natasha Mead, M.D.   Assessment:  The encounter diagnosis was Acute bronchitis.  Plan:   1.  The following meds were prescribed:   Discharge Medication List as of 07/29/2013  1:41 PM    START taking these medications   Details  albuterol (PROVENTIL HFA;VENTOLIN HFA) 108 (90 BASE) MCG/ACT inhaler Inhale 1-2 puffs into the lungs every 6 (six) hours as needed for wheezing., Starting 07/29/2013, Until Discontinued, Normal    beclomethasone (QVAR) 80 MCG/ACT inhaler Inhale 2 puffs into the lungs 2 (two) times daily., Starting 07/29/2013, Until Discontinued, Normal    benzonatate (TESSALON) 200 MG capsule Take 1 capsule (200 mg total) by mouth 3 (three) times daily as needed for cough., Starting 07/29/2013, Until Discontinued, Normal    doxycycline (VIBRA-TABS) 100 MG tablet Take 1 tablet (100 mg total) by mouth 2 (two) times daily., Starting 07/29/2013, Until Discontinued, Normal    !! fluticasone (FLONASE) 50 MCG/ACT nasal spray Place 2 sprays into the nose daily., Starting 07/29/2013, Until Discontinued, Normal     !! - Potential duplicate medications found. Please discuss with provider.  2.  The patient was instructed in symptomatic care and handouts were given. 3.  The patient was told to return if becoming worse in any way, if no better in 3 or 4 days, and given some red flag symptoms such as difficulty breathing or increasing fever that would indicate  earlier return. 4.  Follow up here if necessary.      Reuben Likes, MD 07/29/13 [phone]

## 2014-01-06 ENCOUNTER — Encounter (HOSPITAL_COMMUNITY): Payer: Self-pay | Admitting: Emergency Medicine

## 2014-01-06 ENCOUNTER — Emergency Department (INDEPENDENT_AMBULATORY_CARE_PROVIDER_SITE_OTHER)
Admission: EM | Admit: 2014-01-06 | Discharge: 2014-01-06 | Disposition: A | Discharge status: Nursing Home (Includes ICF) | Payer: Medicare Other | Source: Home / Self Care | Attending: Family Medicine | Admitting: Family Medicine

## 2014-01-06 ENCOUNTER — Other Ambulatory Visit: Payer: Self-pay

## 2014-01-06 ENCOUNTER — Emergency Department (HOSPITAL_COMMUNITY): Payer: Medicare Other

## 2014-01-06 ENCOUNTER — Inpatient Hospital Stay (HOSPITAL_COMMUNITY)
Admission: EM | Admit: 2014-01-06 | Discharge: 2014-01-10 | DRG: 287 | Disposition: A | Discharge status: Home / Self Care | Payer: Medicare Other | Attending: Internal Medicine | Admitting: Internal Medicine

## 2014-01-06 DIAGNOSIS — E876 Hypokalemia: Secondary | ICD-10-CM

## 2014-01-06 DIAGNOSIS — R531 Weakness: Secondary | ICD-10-CM

## 2014-01-06 DIAGNOSIS — Z87891 Personal history of nicotine dependence: Secondary | ICD-10-CM

## 2014-01-06 DIAGNOSIS — I200 Unstable angina: Secondary | ICD-10-CM

## 2014-01-06 DIAGNOSIS — E669 Obesity, unspecified: Secondary | ICD-10-CM | POA: Diagnosis present

## 2014-01-06 DIAGNOSIS — Z888 Allergy status to other drugs, medicaments and biological substances status: Secondary | ICD-10-CM

## 2014-01-06 DIAGNOSIS — M6281 Muscle weakness (generalized): Secondary | ICD-10-CM

## 2014-01-06 DIAGNOSIS — R931 Abnormal findings on diagnostic imaging of heart and coronary circulation: Secondary | ICD-10-CM

## 2014-01-06 DIAGNOSIS — K219 Gastro-esophageal reflux disease without esophagitis: Secondary | ICD-10-CM | POA: Diagnosis present

## 2014-01-06 DIAGNOSIS — R0789 Other chest pain: Principal | ICD-10-CM | POA: Diagnosis present

## 2014-01-06 DIAGNOSIS — Z9089 Acquired absence of other organs: Secondary | ICD-10-CM

## 2014-01-06 DIAGNOSIS — Z833 Family history of diabetes mellitus: Secondary | ICD-10-CM

## 2014-01-06 DIAGNOSIS — Z7982 Long term (current) use of aspirin: Secondary | ICD-10-CM

## 2014-01-06 DIAGNOSIS — I639 Cerebral infarction, unspecified: Secondary | ICD-10-CM

## 2014-01-06 DIAGNOSIS — Z6834 Body mass index (BMI) 34.0-34.9, adult: Secondary | ICD-10-CM

## 2014-01-06 DIAGNOSIS — I498 Other specified cardiac arrhythmias: Secondary | ICD-10-CM | POA: Diagnosis present

## 2014-01-06 DIAGNOSIS — Z8673 Personal history of transient ischemic attack (TIA), and cerebral infarction without residual deficits: Secondary | ICD-10-CM

## 2014-01-06 DIAGNOSIS — I10 Essential (primary) hypertension: Secondary | ICD-10-CM | POA: Diagnosis present

## 2014-01-06 DIAGNOSIS — E119 Type 2 diabetes mellitus without complications: Secondary | ICD-10-CM | POA: Diagnosis present

## 2014-01-06 DIAGNOSIS — R001 Bradycardia, unspecified: Secondary | ICD-10-CM

## 2014-01-06 DIAGNOSIS — Z79899 Other long term (current) drug therapy: Secondary | ICD-10-CM

## 2014-01-06 DIAGNOSIS — E785 Hyperlipidemia, unspecified: Secondary | ICD-10-CM | POA: Diagnosis present

## 2014-01-06 DIAGNOSIS — R079 Chest pain, unspecified: Secondary | ICD-10-CM

## 2014-01-06 DIAGNOSIS — Z8249 Family history of ischemic heart disease and other diseases of the circulatory system: Secondary | ICD-10-CM

## 2014-01-06 DIAGNOSIS — R002 Palpitations: Secondary | ICD-10-CM | POA: Diagnosis present

## 2014-01-06 LAB — POCT I-STAT TROPONIN I
TROPONIN I, POC: 0 ng/mL (ref 0.00–0.08)
Troponin i, poc: 0.01 ng/mL (ref 0.00–0.08)

## 2014-01-06 LAB — CBC
HEMATOCRIT: 40 % (ref 36.0–46.0)
HEMOGLOBIN: 12.8 g/dL (ref 12.0–15.0)
MCH: 29.3 pg (ref 26.0–34.0)
MCHC: 32 g/dL (ref 30.0–36.0)
MCV: 91.5 fL (ref 78.0–100.0)
Platelets: 248 10*3/uL (ref 150–400)
RBC: 4.37 MIL/uL (ref 3.87–5.11)
RDW: 14.6 % (ref 11.5–15.5)
WBC: 5.8 10*3/uL (ref 4.0–10.5)

## 2014-01-06 LAB — BASIC METABOLIC PANEL
BUN: 14 mg/dL (ref 6–23)
CO2: 27 mEq/L (ref 19–32)
CREATININE: 0.84 mg/dL (ref 0.50–1.10)
Calcium: 9.3 mg/dL (ref 8.4–10.5)
Chloride: 106 mEq/L (ref 96–112)
GFR calc Af Amer: 81 mL/min — ABNORMAL LOW (ref >90)
GFR, EST NON AFRICAN AMERICAN: 70 mL/min — AB (ref >90)
GLUCOSE: 69 mg/dL — AB (ref 70–99)
POTASSIUM: 3.8 meq/L (ref 3.7–5.3)
Sodium: 145 mEq/L (ref 137–147)

## 2014-01-06 LAB — D-DIMER, QUANTITATIVE (NOT AT ARMC): D DIMER QUANT: 0.45 ug{FEU}/mL (ref 0.00–0.48)

## 2014-01-06 MED ORDER — ASPIRIN 81 MG PO CHEW
324.0000 mg | CHEWABLE_TABLET | Freq: Once | ORAL | Status: AC
  Administered 2014-01-06: 324 mg via ORAL

## 2014-01-06 MED ORDER — ASPIRIN 81 MG PO CHEW: 324.0000 mg | CHEWABLE_TABLET | Freq: Once | ORAL | Status: AC

## 2014-01-06 MED ORDER — SODIUM CHLORIDE 0.9 % IV SOLN
Freq: Once | INTRAVENOUS | Status: AC
  Administered 2014-01-06: 16:00:00 via INTRAVENOUS

## 2014-01-06 MED ORDER — ONDANSETRON HCL 4 MG/2ML IJ SOLN: 4.0000 mg | Freq: Four times a day (QID) | INTRAMUSCULAR | Status: DC | PRN

## 2014-01-06 MED ORDER — ONDANSETRON HCL 4 MG/2ML IJ SOLN
4.0000 mg | Freq: Once | INTRAMUSCULAR | Status: AC
  Administered 2014-01-06: 4 mg via INTRAVENOUS
  Filled 2014-01-06: qty 2

## 2014-01-06 MED ORDER — ASPIRIN 81 MG PO CHEW
CHEWABLE_TABLET | ORAL | Status: AC
  Filled 2014-01-06: qty 4

## 2014-01-06 NOTE — ED Provider Notes (Signed)
CSN: [id number]     Arrival date & time 01/06/14  1512 History   First MD Initiated Contact with Patient 01/06/14 1520     Chief Complaint  Patient presents with  . Chest Pain     (Consider location/radiation/quality/duration/timing/severity/associated sxs/prior Treatment) HPI Comments: Patient presents to [facility] with 7-10 day history of chest pain ("tightness"), dyspnea and nausea with exertion. States these symptoms have waxed and waned over past several days. Symptoms have begun to occur with simple household chores or with walking to her mailbox and are relieved by rest. Has also experienced intermittent episodes of tachypalpitations with associated near syncope. Reports remote history of cardiac evaluation in 2004 (?) while living in [place]. Thinks she may have had a cardiac catheterization but denies knowledge of balloon angioplasty or stent placement. Is uncertain if she was diagnosed with CAD. Reports both  her father and her brother have "heart problems." Has personal history of DM and HTN and reports blood pressure has been elevated recently and CBGs have been running low without history of diet or medication changes.   Patient is a 68 y.o. female presenting with chest pain. The history is provided by the patient.  Chest Pain   Past Medical History  Diagnosis Date  . Hypertension   . Diabetes mellitus   . Obesity (BMI 35.0-39.9 without comorbidity)   . GERD (gastroesophageal reflux disease)   . Chest pain 2009    MV with no scar or ischemia, EF 65%   Past Surgical History  Procedure Laterality Date  . Back surgery    . Wisdom tooth extraction    . Cholecystectomy    . Temporomandibular joint surgery    . Abdominal hysterectomy  2006    Partial, one ovary left   . Cardiac catheterization  2004    [place], [place] per pt.   Family History  Problem Relation Age of Onset  . Heart disease Father   . Diabetes Maternal Grandmother   . Pancreatitis Mother    History   Substance Use Topics  . Smoking status: Former Smoker -- 1.00 packs/day for .5 years    Types: Cigarettes    Quit date: 05/05/1969  . Smokeless tobacco: Never Used  . Alcohol Use: No   OB History   Grav Para Term Preterm Abortions TAB SAB Ect Mult Living   4 4 4  0 0 0 0 0 0 4     Obstetric Comments   Delivered 4 large babies vaginally     Review of Systems  Cardiovascular: Positive for chest pain.  All other systems reviewed and are negative.      Allergies  Codeine and Adhesive  Home Medications   Current Outpatient Rx  Name  Route  Sig  Dispense  Refill  . aspirin EC 81 MG tablet   Oral   Take 81 mg by mouth daily.         . cholecalciferol (VITAMIN D) 1000 UNITS tablet   Oral   Take 1,000 Units by mouth daily.         Marland Kitchen estradiol (ESTRACE) 0.1 MG/GM vaginal cream      1 gram per vagina 2 times per week at night   42.5 g   12   . glipiZIDE (GLUCOTROL) 5 MG tablet   Oral   Take 10 mg by mouth daily.          . Lansoprazole (PREVACID PO)   Oral   Take 30 mg by mouth daily.          Marland Kitchen  lisinopril-hydrochlorothiazide (PRINZIDE,ZESTORETIC) 10-12.5 MG per tablet   Oral   Take 1 tablet by mouth daily.         Marland Kitchen. albuterol (PROVENTIL HFA;VENTOLIN HFA) 108 (90 BASE) MCG/ACT inhaler   Inhalation   Inhale 1-2 puffs into the lungs every 6 (six) hours as needed for wheezing.   1 Inhaler   0   . beclomethasone (QVAR) 80 MCG/ACT inhaler   Inhalation   Inhale 2 puffs into the lungs 2 (two) times daily.   1 Inhaler   0   . benzonatate (TESSALON) 200 MG capsule   Oral   Take 1 capsule (200 mg total) by mouth 3 (three) times daily as needed for cough.   30 capsule   0   . butalbital-acetaminophen-caffeine (FIORICET, ESGIC) 50-325-40 MG per tablet   Oral   Take 1 tablet by mouth 2 (two) times daily as needed. For headache         . doxycycline (VIBRA-TABS) 100 MG tablet   Oral   Take 1 tablet (100 mg total) by mouth 2 (two) times daily.   20  tablet   0   . fluticasone (FLONASE) 50 MCG/ACT nasal spray   Nasal   Place 2 sprays into the nose daily as needed for rhinitis or allergies.         . fluticasone (FLONASE) 50 MCG/ACT nasal spray   Nasal   Place 2 sprays into the nose daily.   16 g   0   . methocarbamol (ROBAXIN) 500 MG tablet   Oral   Take 1 tablet (500 mg total) by mouth 2 (two) times daily as needed.   20 tablet   0   . ondansetron (ZOFRAN) 4 MG tablet   Oral   Take 1 tablet (4 mg total) by mouth every 8 (eight) hours as needed for nausea.   10 tablet   0   . oxyCODONE-acetaminophen (PERCOCET) 5-325 MG per tablet   Oral   Take 1 tablet by mouth every 4 (four) hours as needed for pain.   20 tablet   0    BP 157/107  Pulse 88  Temp(Src) 97.6 F (36.4 C) (Oral)  Resp 20  SpO2 100% Physical Exam  Nursing note and vitals reviewed. Constitutional: She is oriented to person, place, and time. She appears well-developed and well-nourished.  HENT:  Head: Normocephalic and atraumatic.  Eyes: Conjunctivae are normal. Right eye exhibits no discharge. Left eye exhibits no discharge. No scleral icterus.  Neck: Normal range of motion. Neck supple. No JVD present.  Cardiovascular: Normal rate, regular rhythm and normal heart sounds.  Exam reveals no gallop and no friction rub.   No murmur heard. Pulmonary/Chest: Effort normal and breath sounds normal. No respiratory distress. She has no wheezes. She has no rales.  Abdominal: Soft. Bowel sounds are normal. She exhibits no distension. There is no tenderness.  Musculoskeletal: Normal range of motion.  Neurological: She is alert and oriented to person, place, and time.  Skin: Skin is warm and dry. No rash noted.  Psychiatric: She has a normal mood and affect. Her behavior is normal.    ED Course  Procedures (including critical care time) Labs Review Labs Reviewed - No data to display Imaging Review No results found.  ECG: NSR @ 79 bpm with Left axis  deviation and inverted T waves in septal and inferior leads.    MDM   Final diagnoses:  None  Patient provides information that is concerning for unstable  angina. Placed on 02, given ASA, NS IV initiated and patient to be transferred to [facility] for continued evaluation.   Jess Barters [place], [place] 01/06/14 1600

## 2014-01-06 NOTE — ED Notes (Addendum)
Per EMS[facility] 7/10 aching left sided CP x 2 weeks; worse with inspiration. Pt reports nausea and SOB; denies lightheadedness, diaphoresis . NP cough x 1 week. EKG NSR; unremarkable. VSS. Hx: HTN

## 2014-01-06 NOTE — H&P (Signed)
[facility]                                                                                    Patient Demographics  [facility], is a 68 y.o. female  CSN: [id number]  MRN: [id number]  DOB - Sep 14, 1946  Admit Date - 01/06/2014  Outpatient Primary MD for the patient is Florentina Jenny, MD   With History of -  Past Medical History  Diagnosis Date  . Hypertension   . Diabetes mellitus   . Obesity (BMI 35.0-39.9 without comorbidity)   . GERD (gastroesophageal reflux disease)   . Chest pain 2009    MV with no scar or ischemia, EF 65%      Past Surgical History  Procedure Laterality Date  . Back surgery    . Wisdom tooth extraction    . Cholecystectomy    . Temporomandibular joint surgery    . Abdominal hysterectomy  2006    Partial, one ovary left   . Cardiac catheterization  2004    [place], [place] per pt.    in for   Chief Complaint  Patient presents with  . Chest Pain     HPI  [place] Kulig  is a 68 y.o. female, with past medical history significant for hypertension, diabetes mellitus, obesity and GERD presenting today with one-week history of chest pain episodes medical care daily with no precipitating or relieving factors. The pain is left-sided squeezing in nature associated with nausea, cold sweats and mild shortness of breath. The chest pain is rated between 6-8/10 and nonradiating. No cough, fever or chills. Today, the episode started prior to presentation and is still continuous around 6/10. Cardiology was contacted by the emergency room physician and it seems that she has a negative previous workup. No history of leg swelling, dyspnea on exertion, but reports severe PND 's . Discussed with ER physician and decided to admit the patient for risk stratification.    Review of Systems    In addition to the HPI above,  No Fever-chills, No Headache, No changes with Vision or hearing, No problems swallowing food or Liquids, No Chest pain,  Cough or Shortness of Breath, No Abdominal pain, No Nausea or Vommitting, Bowel movements are regular, No Blood in stool or Urine, No dysuria, No new skin rashes or bruises, No new joints pains-aches,  No new weakness, tingling, numbness in any extremity, No recent weight gain or loss, No polyuria, polydypsia or polyphagia, No significant Mental Stressors.  A full 10 point Review of Systems was done, except as stated above, all other Review of Systems were negative.   Social History History  Substance Use Topics  . Smoking status: Former Smoker -- 1.00 packs/day for .5 years    Types: Cigarettes    Quit date: 05/05/1969  . Smokeless tobacco: Never Used  . Alcohol Use: No     Family History Family History  Problem Relation Age of Onset  . Heart disease Father   . Diabetes Maternal Grandmother   . Pancreatitis Mother      Prior to Admission medications   Medication Sig Start Date End Date Taking? Authorizing Provider  albuterol (  PROVENTIL HFA;VENTOLIN HFA) 108 (90 BASE) MCG/ACT inhaler Inhale 1-2 puffs into the lungs every 6 (six) hours as needed for wheezing. 07/29/13  Yes Reuben Likes, MD  aspirin EC 81 MG tablet Take 81 mg by mouth daily.   Yes Historical Provider, MD  beclomethasone (QVAR) 80 MCG/ACT inhaler Inhale 2 puffs into the lungs 2 (two) times daily. 07/29/13  Yes Reuben Likes, MD  butalbital-acetaminophen-caffeine (FIORICET, ESGIC) [phone] MG per tablet Take 1 tablet by mouth 2 (two) times daily as needed. For headache   Yes Historical Provider, MD  cholecalciferol (VITAMIN D) 1000 UNITS tablet Take 1,000 Units by mouth daily.   Yes Historical Provider, MD  estradiol (ESTRACE) 0.1 MG/GM vaginal cream 1 gram per vagina 2 times per week at night 06/18/13  Yes Myra C Dove, MD  fluticasone (FLONASE) 50 MCG/ACT nasal spray Place 2 sprays into the nose daily as needed for rhinitis or allergies.   Yes Historical Provider, MD  glipiZIDE (GLUCOTROL) 5 MG tablet Take 10  mg by mouth daily.    Yes Historical Provider, MD  lansoprazole (PREVACID) 30 MG capsule Take 30 mg by mouth daily at 12 noon.   Yes Historical Provider, MD  lisinopril-hydrochlorothiazide (PRINZIDE,ZESTORETIC) 10-12.5 MG per tablet Take 1 tablet by mouth daily.   Yes Historical Provider, MD    Allergies  Allergen Reactions  . Codeine Nausea And Vomiting  . Adhesive [Tape] Rash    Physical Exam  Vitals  Blood pressure 158/98, pulse 80, temperature 98.2 F (36.8 C), temperature source Oral, resp. rate 21, SpO2 100.00%.   1. General elderly African American female, in bed, still in pain  2. Normal affect and insight, Not Suicidal or Homicidal, Awake Alert, Oriented X 3.  3. No F.N deficits, ALL C.Nerves Intact, Strength 5/5 all 4 extremities, Sensation intact all 4 extremities, Plantars down going.  4. Ears and Eyes appear Normal, Conjunctivae clear, PERRLA. Moist Oral Mucosa.  5. Supple Neck, No JVD, No cervical lymphadenopathy appriciated, No Carotid Bruits.  6. Symmetrical Chest wall movement, Good air movement bilaterally, CTAB.  7. RRR, No Gallops, Rubs or Murmurs, No Parasternal Heave.  8. Positive Bowel Sounds, Abdomen Soft, Non tender, No organomegaly appriciated,No rebound -guarding or rigidity.  9.  No Cyanosis, Normal Skin Turgor, No Skin Rash or Bruise.  10. Good muscle tone,  joints appear normal , no effusions, Normal ROM.  11. No Palpable Lymph Nodes in Neck or Axillae   Data Review  CBC  Recent Labs Lab 01/06/14 1652  WBC 5.8  HGB 12.8  HCT 40.0  PLT 248  MCV 91.5  MCH 29.3  MCHC 32.0  RDW 14.6   ------------------------------------------------------------------------------------------------------------------  Chemistries   Recent Labs Lab 01/06/14 1652  NA 145  K 3.8  CL 106  CO2 27  GLUCOSE 69*  BUN 14  CREATININE 0.84  CALCIUM 9.3    ------------------------------------------------------------------------------------------------------------------ CrCl is unknown because both a height and weight (above a minimum accepted value) are required for this calculation. ------------------------------------------------------------------------------------------------------------------ No results found for this basename: TSH, T4TOTAL, FREET3, T3FREE, THYROIDAB,  in the last 72 hours   Coagulation profile No results found for this basename: INR, PROTIME,  in the last 168 hours -------------------------------------------------------------------------------------------------------------------  Recent Labs  01/06/14 1855  DDIMER 0.45   Imaging results:   Dg Chest 2 View  01/06/2014   CLINICAL DATA:  Chest pain.  EXAM: CHEST  2 VIEW  COMPARISON:  07/29/2013  FINDINGS: Heart size and pulmonary vascularity are normal and  the lungs are clear. There is tortuosity of the thoracic aorta. No acute osseous abnormality.  IMPRESSION: No acute disease.   Electronically Signed   By: Geanie CooleyJim  Maxwell M.D.   On: 01/06/2014 18:39    My personal review of EKG: Normal sinus rhythm at a rate of 79 beats per minute left axis deviation and  T-wave changes noted in the anterior leads.    Assessment & Plan  1. chest pain; still active     Negative workup in the past     Check serial troponins      Nitro paste     ?Cardiology consult in AM 2. History of hypertension, diabetes and GERD     C/W medxs    DVT Prophylaxis Lovenox  AM Labs Ordered, also please review Full Orders  Family Communication: Admission, patients condition and plan of care including tests being ordered have been discussed with the patient  who indicates understanding and agrees with the plan and Code Status.  Code Status full  Disposition Plan: Home  Time spent in minutes : 31 minutes  Condition GUARDED

## 2014-01-06 NOTE — ED Notes (Signed)
Pt feeling naseous and hypoglycemic. Md Effie ShyWentz made aware; pt appropriate to eat. Pt provided with orange juice and a [place] sandwich.

## 2014-01-06 NOTE — ED Notes (Signed)
Pt c/o intermittent chest pain onset 1 week Sxs also include: nauseas, SOB, and "feels like passing out" Hx of DM and HTN Alert and talking in complete sentences w/no signs of acute distress.

## 2014-01-06 NOTE — ED Notes (Signed)
Notified non-emergency ems 

## 2014-01-06 NOTE — ED Provider Notes (Signed)
CSN: [id number]     Arrival date & time 01/06/14  1640 History   First MD Initiated Contact with Patient 01/06/14 1821     Chief Complaint  Patient presents with  . Chest Pain     (Consider location/radiation/quality/duration/timing/severity/associated sxs/prior Treatment) Patient is a 68 y.o. female presenting with chest pain. The history is provided by the patient.  Chest Pain  She complains of chest pain is present every waking minute for 1 week. The pain began gradually and then worsened in the last 3 days. The pain feels like a pressure sensation in her left anterior chest. The pain does not radiate. The pain changes in severity ranging between 5 and 10 out of 10. Exacerbating factors include ambulation climbing steps and the exertion of household chores. She is concerned that she has a cardiac problem because 2 female relatives have been told that he had a inherited cardiac disease that required him to have a valve replacement. The patient had a stress Myoview in 2009 and had a cardiac catheterization 2004. Neither test showed evidence for cardiac disease. She is taking her usual medications, without relief. No other known modifying factors.  Past Medical History  Diagnosis Date  . Hypertension   . Diabetes mellitus   . Obesity (BMI 35.0-39.9 without comorbidity)   . GERD (gastroesophageal reflux disease)   . Chest pain 2009    MV with no scar or ischemia, EF 65%   Past Surgical History  Procedure Laterality Date  . Back surgery    . Wisdom tooth extraction    . Cholecystectomy    . Temporomandibular joint surgery    . Abdominal hysterectomy  2006    Partial, one ovary left   . Cardiac catheterization  2004    [place], [place] per pt.   Family History  Problem Relation Age of Onset  . Heart disease Father   . Diabetes Maternal Grandmother   . Pancreatitis Mother    History  Substance Use Topics  . Smoking status: Former Smoker -- 1.00 packs/day for .5 years   Types: Cigarettes    Quit date: 05/05/1969  . Smokeless tobacco: Never Used  . Alcohol Use: No   OB History   Grav Para Term Preterm Abortions TAB SAB Ect Mult Living   4 4 4  0 0 0 0 0 0 4     Obstetric Comments   Delivered 4 large babies vaginally     Review of Systems  Cardiovascular: Positive for chest pain.  All other systems reviewed and are negative.      Allergies  Codeine and Adhesive  Home Medications   Current Outpatient Rx  Name  Route  Sig  Dispense  Refill  . albuterol (PROVENTIL HFA;VENTOLIN HFA) 108 (90 BASE) MCG/ACT inhaler   Inhalation   Inhale 1-2 puffs into the lungs every 6 (six) hours as needed for wheezing.   1 Inhaler   0   . aspirin EC 81 MG tablet   Oral   Take 81 mg by mouth daily.         . beclomethasone (QVAR) 80 MCG/ACT inhaler   Inhalation   Inhale 2 puffs into the lungs 2 (two) times daily.   1 Inhaler   0   . butalbital-acetaminophen-caffeine (FIORICET, ESGIC) 50-325-40 MG per tablet   Oral   Take 1 tablet by mouth 2 (two) times daily as needed. For headache         . cholecalciferol (VITAMIN D) 1000 UNITS tablet  Oral   Take 1,000 Units by mouth daily.         Marland Kitchen estradiol (ESTRACE) 0.1 MG/GM vaginal cream      1 gram per vagina 2 times per week at night   42.5 g   12   . fluticasone (FLONASE) 50 MCG/ACT nasal spray   Nasal   Place 2 sprays into the nose daily as needed for rhinitis or allergies.         Marland Kitchen glipiZIDE (GLUCOTROL) 5 MG tablet   Oral   Take 10 mg by mouth daily.          . lansoprazole (PREVACID) 30 MG capsule   Oral   Take 30 mg by mouth daily at 12 noon.         Marland Kitchen lisinopril-hydrochlorothiazide (PRINZIDE,ZESTORETIC) 10-12.5 MG per tablet   Oral   Take 1 tablet by mouth daily.          BP 150/125  Pulse 80  Temp(Src) 98.2 F (36.8 C) (Oral)  Resp 18  SpO2 100% Physical Exam  Nursing note and vitals reviewed. Constitutional: She is oriented to person, place, and time. She  appears well-developed.  Elderly, frail  HENT:  Head: Normocephalic and atraumatic.  Eyes: Conjunctivae and EOM are normal. Pupils are equal, round, and reactive to light.  Neck: Normal range of motion and phonation normal. Neck supple.  Cardiovascular: Normal rate, regular rhythm and intact distal pulses.   Pulmonary/Chest: Effort normal and breath sounds normal. She exhibits no tenderness.  Abdominal: Soft. She exhibits no distension. There is no tenderness. There is no guarding.  Musculoskeletal: Normal range of motion.  Neurological: She is alert and oriented to person, place, and time. She exhibits normal muscle tone.  Skin: Skin is warm and dry.  Psychiatric: She has a normal mood and affect. Her behavior is normal. Judgment and thought content normal.    ED Course  Procedures (including critical care time)  Medications  ondansetron (ZOFRAN) injection 4 mg (4 mg Intravenous Given 01/06/14 1750)  aspirin chewable tablet 324 mg (0 mg Oral Duplicate 01/06/14 1906)    Patient Vitals for the past 24 hrs:  BP Temp Temp src Pulse Resp SpO2  01/06/14 2154 150/125 mmHg - - 80 18 100 %  01/06/14 2100 147/91 mmHg - - 63 19 99 %  01/06/14 2030 147/94 mmHg - - 76 24 100 %  01/06/14 2000 129/92 mmHg - - 72 15 100 %  01/06/14 1900 153/97 mmHg - - 72 18 100 %  01/06/14 1730 136/88 mmHg - - 77 26 100 %  01/06/14 1652 151/91 mmHg 98.2 F (36.8 C) Oral 68 20 99 %  01/06/14 1646 - - - 70 - -  01/06/14 1645 155/95 mmHg - - - - 96 %    9:35 PM Reevaluation with update and discussion. After initial assessment and treatment, an updated evaluation reveals She still has 6/10 CP, without other sx. [name] [name]   2140-  discussed with on-call cardiology [name] [name]) , who will see the patient, if needed as a consultation, and advises admission to a hospitalist service.   9:47 PM-Consult complete with [name] [name]. Patient case explained and discussed. He agrees to admit patient for further  evaluation and treatment. Call ended at 2157  Labs Review Labs Reviewed  BASIC METABOLIC PANEL - Abnormal; Notable for the following:    Glucose, Bld 69 (*)    GFR calc non Af Amer 70 (*)    GFR calc  Af Amer 81 (*)    All other components within normal limits  CBC  D-DIMER, QUANTITATIVE  POCT I-STAT TROPONIN I  POCT I-STAT TROPONIN I   Imaging Review Dg Chest 2 View  01/06/2014   CLINICAL DATA:  Chest pain.  EXAM: CHEST  2 VIEW  COMPARISON:  07/29/2013  FINDINGS: Heart size and pulmonary vascularity are normal and the lungs are clear. There is tortuosity of the thoracic aorta. No acute osseous abnormality.  IMPRESSION: No acute disease.   Electronically Signed   By: Geanie CooleyJim  Maxwell M.D.   On: 01/06/2014 18:39    EKG Interpretation   None       Date: 01/06/14- 16:50  Rate: 65  Rhythm: normal sinus rhythm  QRS Axis: normal  PR and QT Intervals: normal  ST/T Wave abnormalities: nonspecific T wave changes  PR and QRS Conduction Disutrbances:none  Narrative Interpretation:   Old EKG Reviewed: unchanged   MDM   Final diagnoses:  Chest pain    Nonspecific ongoing chest pain with elevated cardiac risk factor profile. It is unclear what her familial cardiac history is, but it may be significant. Her cardiac evaluation emergency Department is negative, including delta troponin. There is no evidence for PE, pneumonia, metabolic instability, or impending cardiovascular collapse. She will need to be admitted for further evaluation, and risks patient. Cardiac profile    Flint MelterElliott [name] Laker Thompson, MD 01/06/14 2158

## 2014-01-06 NOTE — ED Notes (Signed)
No [facility] available 

## 2014-01-07 ENCOUNTER — Encounter (HOSPITAL_COMMUNITY): Payer: Self-pay | Admitting: *Deleted

## 2014-01-07 DIAGNOSIS — I6350 Cerebral infarction due to unspecified occlusion or stenosis of unspecified cerebral artery: Secondary | ICD-10-CM

## 2014-01-07 DIAGNOSIS — R079 Chest pain, unspecified: Secondary | ICD-10-CM

## 2014-01-07 DIAGNOSIS — I498 Other specified cardiac arrhythmias: Secondary | ICD-10-CM

## 2014-01-07 DIAGNOSIS — E669 Obesity, unspecified: Secondary | ICD-10-CM

## 2014-01-07 DIAGNOSIS — I10 Essential (primary) hypertension: Secondary | ICD-10-CM

## 2014-01-07 DIAGNOSIS — E119 Type 2 diabetes mellitus without complications: Secondary | ICD-10-CM

## 2014-01-07 LAB — GLUCOSE, CAPILLARY
GLUCOSE-CAPILLARY: 243 mg/dL — AB (ref 70–99)
GLUCOSE-CAPILLARY: 93 mg/dL (ref 70–99)
Glucose-Capillary: 149 mg/dL — ABNORMAL HIGH (ref 70–99)
Glucose-Capillary: 68 mg/dL — ABNORMAL LOW (ref 70–99)
Glucose-Capillary: 79 mg/dL (ref 70–99)
Glucose-Capillary: 89 mg/dL (ref 70–99)

## 2014-01-07 LAB — HEMOGLOBIN A1C
HEMOGLOBIN A1C: 6.9 % — AB (ref <5.7)
Mean Plasma Glucose: 151 mg/dL — ABNORMAL HIGH (ref <117)

## 2014-01-07 LAB — CBC
HCT: 40.5 % (ref 36.0–46.0)
Hemoglobin: 13.4 g/dL (ref 12.0–15.0)
MCH: 30.5 pg (ref 26.0–34.0)
MCHC: 33.1 g/dL (ref 30.0–36.0)
MCV: 92 fL (ref 78.0–100.0)
PLATELETS: 257 10*3/uL (ref 150–400)
RBC: 4.4 MIL/uL (ref 3.87–5.11)
RDW: 14.9 % (ref 11.5–15.5)
WBC: 6.6 10*3/uL (ref 4.0–10.5)

## 2014-01-07 LAB — PRO B NATRIURETIC PEPTIDE: PRO B NATRI PEPTIDE: 68.6 pg/mL (ref 0–125)

## 2014-01-07 LAB — CREATININE, SERUM
Creatinine, Ser: 0.86 mg/dL (ref 0.50–1.10)
GFR calc Af Amer: 79 mL/min — ABNORMAL LOW (ref >90)
GFR calc non Af Amer: 68 mL/min — ABNORMAL LOW (ref >90)

## 2014-01-07 LAB — TROPONIN I
Troponin I: <0.3 ng/mL (ref <0.30)
Troponin I: <0.3 ng/mL (ref <0.30)
Troponin I: <0.3 ng/mL (ref <0.30)

## 2014-01-07 MED ORDER — GLIPIZIDE 10 MG PO TABS
10.0000 mg | ORAL_TABLET | Freq: Every day | ORAL | Status: DC
  Administered 2014-01-07: 10 mg via ORAL
  Filled 2014-01-07 (×2): qty 1

## 2014-01-07 MED ORDER — SODIUM CHLORIDE 0.9 % IV SOLN: 250.0000 mL | INTRAVENOUS | Status: DC | PRN

## 2014-01-07 MED ORDER — SODIUM CHLORIDE 0.9 % IJ SOLN
3.0000 mL | INTRAMUSCULAR | Status: DC | PRN
Start: 2014-01-07 — End: 2014-01-10

## 2014-01-07 MED ORDER — ASPIRIN EC 81 MG PO TBEC
81.0000 mg | DELAYED_RELEASE_TABLET | Freq: Every day | ORAL | Status: DC
  Administered 2014-01-07 – 2014-01-10 (×4): 81 mg via ORAL
  Filled 2014-01-07 (×4): qty 1

## 2014-01-07 MED ORDER — INSULIN ASPART 100 UNIT/ML ~~LOC~~ SOLN: 0.0000 [IU] | Freq: Three times a day (TID) | SUBCUTANEOUS | Status: DC

## 2014-01-07 MED ORDER — LISINOPRIL 10 MG PO TABS
10.0000 mg | ORAL_TABLET | Freq: Every day | ORAL | Status: DC
  Administered 2014-01-07 – 2014-01-10 (×4): 10 mg via ORAL
  Filled 2014-01-07 (×4): qty 1

## 2014-01-07 MED ORDER — ONDANSETRON HCL 4 MG PO TABS: 4.0000 mg | ORAL_TABLET | Freq: Four times a day (QID) | ORAL | Status: DC | PRN

## 2014-01-07 MED ORDER — ALUM & MAG HYDROXIDE-SIMETH 200-200-20 MG/5ML PO SUSP: 30.0000 mL | Freq: Four times a day (QID) | ORAL | Status: DC | PRN

## 2014-01-07 MED ORDER — SODIUM CHLORIDE 0.9 % IJ SOLN
3.0000 mL | Freq: Two times a day (BID) | INTRAMUSCULAR | Status: DC
  Administered 2014-01-07: 3 mL via INTRAVENOUS

## 2014-01-07 MED ORDER — LISINOPRIL-HYDROCHLOROTHIAZIDE 10-12.5 MG PO TABS: 1.0000 | ORAL_TABLET | Freq: Every day | ORAL | Status: DC

## 2014-01-07 MED ORDER — PANTOPRAZOLE SODIUM 20 MG PO TBEC
20.0000 mg | DELAYED_RELEASE_TABLET | Freq: Every day | ORAL | Status: DC
  Administered 2014-01-07 – 2014-01-10 (×4): 20 mg via ORAL
  Filled 2014-01-07 (×4): qty 1

## 2014-01-07 MED ORDER — NITROGLYCERIN 2 % TD OINT
0.5000 [in_us] | TOPICAL_OINTMENT | Freq: Four times a day (QID) | TRANSDERMAL | Status: DC
  Administered 2014-01-07 – 2014-01-08 (×8): 0.5 [in_us] via TOPICAL
  Filled 2014-01-07 (×2): qty 30

## 2014-01-07 MED ORDER — ENOXAPARIN SODIUM 40 MG/0.4ML ~~LOC~~ SOLN
40.0000 mg | SUBCUTANEOUS | Status: DC
  Administered 2014-01-07 – 2014-01-08 (×2): 40 mg via SUBCUTANEOUS
  Filled 2014-01-07 (×4): qty 0.4

## 2014-01-07 MED ORDER — MORPHINE SULFATE 2 MG/ML IJ SOLN: 2.0000 mg | INTRAMUSCULAR | Status: DC | PRN

## 2014-01-07 MED ORDER — SODIUM CHLORIDE 0.9 % IJ SOLN
3.0000 mL | Freq: Two times a day (BID) | INTRAMUSCULAR | Status: DC
  Administered 2014-01-07 – 2014-01-09 (×6): 3 mL via INTRAVENOUS

## 2014-01-07 MED ORDER — ONDANSETRON HCL 4 MG/2ML IJ SOLN
4.0000 mg | Freq: Four times a day (QID) | INTRAMUSCULAR | Status: DC | PRN
Start: 2014-01-07 — End: 2014-01-10
  Administered 2014-01-08 (×2): 4 mg via INTRAVENOUS
  Filled 2014-01-07: qty 2

## 2014-01-07 MED ORDER — FLUTICASONE PROPIONATE 50 MCG/ACT NA SUSP: 2.0000 | Freq: Every day | NASAL | Status: DC | PRN

## 2014-01-07 MED ORDER — VITAMIN D3 25 MCG (1000 UNIT) PO TABS
1000.0000 [IU] | ORAL_TABLET | Freq: Every day | ORAL | Status: DC
  Administered 2014-01-07 – 2014-01-10 (×4): 1000 [IU] via ORAL
  Filled 2014-01-07 (×4): qty 1

## 2014-01-07 MED ORDER — ACETAMINOPHEN 650 MG RE SUPP: 650.0000 mg | Freq: Four times a day (QID) | RECTAL | Status: DC | PRN

## 2014-01-07 MED ORDER — ACETAMINOPHEN 325 MG PO TABS
650.0000 mg | ORAL_TABLET | Freq: Four times a day (QID) | ORAL | Status: DC | PRN
  Administered 2014-01-07 – 2014-01-09 (×5): 650 mg via ORAL
  Filled 2014-01-07 (×5): qty 2

## 2014-01-07 MED ORDER — HYDROCHLOROTHIAZIDE 12.5 MG PO CAPS
12.5000 mg | ORAL_CAPSULE | Freq: Every day | ORAL | Status: DC
  Administered 2014-01-07 – 2014-01-10 (×3): 12.5 mg via ORAL
  Filled 2014-01-07 (×4): qty 1

## 2014-01-07 MED ORDER — OXYCODONE HCL 5 MG PO TABS
5.0000 mg | ORAL_TABLET | ORAL | Status: DC | PRN
  Administered 2014-01-07 – 2014-01-08 (×2): 5 mg via ORAL
  Filled 2014-01-07 (×2): qty 1

## 2014-01-07 MED ORDER — BUTALBITAL-APAP-CAFFEINE 50-325-40 MG PO TABS: 1.0000 | ORAL_TABLET | ORAL | Status: DC | PRN

## 2014-01-07 MED ORDER — ESTRADIOL 0.1 MG/GM VA CREA
1.0000 | TOPICAL_CREAM | Freq: Every day | VAGINAL | Status: DC
  Administered 2014-01-07 – 2014-01-09 (×3): 1 via VAGINAL
  Filled 2014-01-07: qty 42.5

## 2014-01-07 MED ORDER — ALBUTEROL SULFATE (2.5 MG/3ML) 0.083% IN NEBU
2.5000 mg | INHALATION_SOLUTION | Freq: Four times a day (QID) | RESPIRATORY_TRACT | Status: DC | PRN
Start: 2014-01-07 — End: 2014-01-10

## 2014-01-07 MED ORDER — FLUTICASONE PROPIONATE HFA 44 MCG/ACT IN AERO
1.0000 | INHALATION_SPRAY | Freq: Two times a day (BID) | RESPIRATORY_TRACT | Status: DC
  Administered 2014-01-07 – 2014-01-09 (×5): 1 via RESPIRATORY_TRACT
  Filled 2014-01-07 (×2): qty 10.6

## 2014-01-07 NOTE — Consult Note (Signed)
Admit date: 01/06/2014 Referring Physician  Dr. Cena BentonVega Primary Physician TRIPP, Sherilyn CooterHENRY, MD Primary Cardiologist  None Reason for Consultation  Chest pain  HPI: 68 year old here for the evaluation of chest discomfort, left mid chest that has been present off and on for proximally one week. Describes as a grabbing like sensation that starts out like a tooth ache then gradually gets worse over 15 minutes then subsides. Usually it occurs when she is doing something she states. Does not change with food. Occasionally associated with nausea, diaphoresis, shortness of breath. Occasional heart "fluttering" palpitations. Thinks that nitro paste has helped relieve discomfort.  Has a history of diabetes, hypertension, obesity.  In 2004 she underwent heart catheterization that was normal per patient. This was after she had a "irregular heartbeat".  Her brother has had a valvular issue and valve replacement and was told that was congenital and his siblings should be checked out. Recent echocardiogram from 2013 showed no adverse valvular abnormalities, only mild mitral regurgitation. Her father also had heart disease.   05/07/12-nuclear stress test, normal EF 60% and no evidence of significant ischemia identified. No prior infarction. Breast attenuation/diaphragmatic attenuation noted. Small area of mild anteroapical and mild inferobasal lateral attenuation artifact.  Currently she is comfortable in bed and has not any further chest discomfort.   PMH:   Past Medical History  Diagnosis Date  . Hypertension   . Diabetes mellitus   . Obesity (BMI 35.0-39.9 without comorbidity)   . GERD (gastroesophageal reflux disease)   . Chest pain 2009    MV with no scar or ischemia, EF 65%    PSH:   Past Surgical History  Procedure Laterality Date  . Wisdom tooth extraction    . Cholecystectomy    . Temporomandibular joint surgery    . Abdominal hysterectomy  2006    Partial, one ovary left   . [place]   Allergies:  Codeine and Adhesive Prior to Admit Meds:   Prior to Admission medications   Medication Sig Start Date End Date Taking? Authorizing Provider  albuterol (PROVENTIL HFA;VENTOLIN HFA) 108 (90 BASE) MCG/ACT inhaler Inhale 1-2 puffs into the lungs every 6 (six) hours as needed for wheezing. 07/29/13  Yes Reuben Likesavid C Keller, MD  aspirin EC 81 MG tablet Take 81 mg by mouth daily.   Yes Historical Provider, MD  beclomethasone (QVAR) 80 MCG/ACT inhaler Inhale 2 puffs into the lungs 2 (two) times daily. 07/29/13  Yes Reuben Likesavid C Keller, MD  butalbital-acetaminophen-caffeine (FIORICET, ESGIC) [phone] MG per tablet Take 1 tablet by mouth 2 (two) times daily as needed. For headache   Yes Historical Provider, MD  cholecalciferol (VITAMIN D) 1000 UNITS tablet Take 1,000 Units by mouth daily.   Yes Historical Provider, MD  estradiol (ESTRACE) 0.1 MG/GM vaginal cream 1 gram per vagina 2 times per week at night 06/18/13  Yes Myra C Dove, MD  fluticasone (FLONASE) 50 MCG/ACT nasal spray Place 2 sprays into the nose daily as needed for rhinitis or allergies.   Yes Historical Provider, MD  glipiZIDE (GLUCOTROL) 5 MG tablet Take 10 mg by mouth daily.    Yes Historical Provider, MD  lansoprazole (PREVACID) 30 MG capsule Take 30 mg by mouth daily at 12 noon.   Yes Historical Provider, MD  lisinopril-hydrochlorothiazide (PRINZIDE,ZESTORETIC) 10-12.5 MG per tablet Take 1 tablet by mouth daily.   Yes Historical Provider, MD   Fam HX:    Family History  Problem Relation Age of Onset  . Heart disease Father   . Diabetes Maternal Grandmother   . Pancreatitis Mother    Social HX:    History   Social History  . Marital Status: Married    Spouse Name: N/A    Number of Children: N/A  . Years of Education: N/A   Occupational History  . Caregiver for the elderly    Social History Main Topics  . Smoking status: Former Smoker -- 1.00 packs/day for .5 years     Types: Cigarettes    Quit date: 05/05/1969  . Smokeless tobacco: Never Used  . Alcohol Use: No  . Drug Use: No  . Sexual Activity: No   Other Topics Concern  . Not on file   Social History Narrative   Family History Details: Her mother died at age 76 of pancreatitis. Her father died of mitral valve disease at age 20. She has a brother who has had myocardial infarction and also has had bypass surgery and a sister who    has a pacemaker.           ROS:  All 11 ROS were addressed and are negative except what is stated in the HPI   Physical Exam: Blood pressure 116/69, pulse 69, temperature 98.4 F (36.9 C), temperature source Oral, resp. rate 18, height 5\' 3"  (1.6 m), weight 195 lb 1.7 oz (88.5 kg), SpO2 100.00%.   General: Well developed, well nourished, in no acute distress Head: Eyes PERRLA, No xanthomas.   Normal cephalic and atramatic  Lungs:   Clear bilaterally to auscultation and percussion. Normal respiratory effort. No wheezes, no rales. Heart:   HRRR S1 S2 Pulses are 2+ & equal. No murmur, rubs, gallops.  No carotid bruit. No JVD.  No abdominal bruits.  Abdomen: Bowel sounds are positive, abdomen soft and non-tender without masses. No hepatosplenomegaly. Obese Msk:  Back normal. Normal strength and tone for age. Extremities:  No clubbing, cyanosis or edema.  DP +1 Neuro: Alert and oriented X 3, non-focal, MAE x 4 GU: Deferred Rectal: Deferred Psych:  Good affect, responds appropriately      Labs: Lab Results  Component Value Date   WBC 6.6 01/07/2014   HGB 13.4 01/07/2014   HCT 40.5 01/07/2014   MCV 92.0 01/07/2014   PLT 257 01/07/2014     Recent Labs Lab 01/06/14 1652 01/07/14 0352  NA 145  --   K 3.8  --   CL 106  --   CO2 27  --   BUN 14  --   CREATININE 0.84 0.86  CALCIUM 9.3  --   GLUCOSE 69*  --     Recent Labs  01/07/14 0352 01/07/14 0732  TROPONINI <0.30 <0.30   Lab Results  Component Value Date   CHOL 129 05/06/2012   HDL 38* 05/06/2012    LDLCALC 72 05/06/2012   TRIG 97 05/06/2012   Lab Results  Component Value Date   DDIMER 0.45 01/06/2014     Radiology:  Dg Chest 2 View  01/06/2014   CLINICAL DATA:  Chest pain.  EXAM: CHEST  2 VIEW  COMPARISON:  07/29/2013  FINDINGS: Heart size and pulmonary vascularity are normal and the lungs are clear. There is tortuosity of the thoracic aorta. No acute osseous abnormality.  IMPRESSION: No acute disease.   Electronically Signed   By: Geanie Cooley M.D.   On: 01/06/2014 18:39   Personally viewed.  EKG:  Sinus rhythm with diffuse, nonspecific  T wave inversion in multiple leads of mild amplitude. EKG from 07/01/12 demonstrates minimal T-wave changes. Personally viewed.   Echocardiogram 05/06/12: - Left ventricle: The cavity size was normal. Wall thickness was increased in a pattern of mild LVH. Systolic function was normal. The estimated ejection fraction was in the range of 50% to 55%. Regional wall motion abnormalities cannot be excluded. Features are consistent with a pseudonormal left ventricular filling pattern, with concomitant abnormal relaxation and increased filling pressure (grade 2 diastolic dysfunction). - Mitral valve: Mild regurgitation. - Left atrium: The atrium was mildly dilated. - Pulmonary arteries: Systolic pressure was mildly increased. PA peak pressure: 37mm Hg (S).   ASSESSMENT/PLAN:   68 year old with diabetes, hypertension, obesity with chest pain, abnormal EKG.  1. Chest pain-has both typical as well as atypical features. It has been over a year and a half since last cardiovascular evaluation as described above which showed overall low risk stress test. Based upon her normal troponin, nonspecific T-wave changes, I would like to proceed with nuclear stress test. We will make her n.p.o. past midnight and perform test tomorrow. I will order test.  2. Diabetes-per primary team.  3. Hypertension-currently reasonable control.  4. Obesity-continue to encourage  weight loss.  5. Hyperlipidemia-LDL 72. Overall good control. However, given diabetes, I would advocate statin therapy.  Donato Schultz, MD  01/07/2014  12:19 PM

## 2014-01-07 NOTE — Progress Notes (Signed)
[facility] PROGRESS NOTE  [place] ZOX:[id number] DOB: 07-24-1946 DOA: 01/06/2014 PCP: Florentina JennyRIPP, [name], MD  Assessment/Plan: 1. Chest discomfort: Has been present for 1 week. Associated with nausea, diaphoresis, and heart flutter per patient.  Worse with activity - Given above characteristics will consult cardiology to assess. Pt may require further testing of which I would like for cardiology to assess to see if they agree with other testing modalities given above characteristics of chest discomfort. - Continue telemetry monitoring, 2 i-STAT troponin was negative, 2 lab of troponins negative  2. Hypertension - Well controlled currently on lisinopril and hydrochlorothiazide  3. Diabetes mellitus -Continue sliding scale insulin. - Given blood sugar of 68 this a.m. we'll plan on holding glipizide  4. DVT prophylaxis -Continue Lovenox  Code Status: full Family Communication: No family at bedside  Disposition Plan: Pending further work up, Cardiology consulted   Consultants:  Consulted cardiology  Procedures:  None  Antibiotics:  None  HPI/Subjective: Patient has no new complaints. Reported chest discomfort as left mid chest that has been present for 1 week. The discomfort has not gone into her left arm or jaw. Has at times been associated with diaphoresis and nausea. Also reports heart fluttering. She states that the nitro paste helped ameliorate her chest discomfort.  Objective: Filed Vitals:   01/07/14 1038  BP: 116/69  Pulse:   Temp:   Resp:     Intake/Output Summary (Last 24 hours) at 01/07/14 1210 Last data filed at 01/07/14 0900  Gross per 24 hour  Intake    240 ml  Output      0 ml  Net    240 ml   Filed Weights   01/07/14 0020  Weight: 88.5 kg (195 lb 1.7 oz)    Exam:   General:  Patient in no acute distress, alert and awake  Cardiovascular: Regular rate and rhythm, no murmur  Respiratory: Clear to auscultation bilaterally no  wheezes  Abdomen: Soft, nontender, nondistended  Musculoskeletal: No chest discomfort on palpation, no cyanosis  Data Reviewed: Basic Metabolic Panel:  Recent Labs Lab 01/06/14 1652 01/07/14 0352  NA 145  --   K 3.8  --   CL 106  --   CO2 27  --   GLUCOSE 69*  --   BUN 14  --   CREATININE 0.84 0.86  CALCIUM 9.3  --    Liver Function Tests: No results found for this basename: AST, ALT, ALKPHOS, BILITOT, PROT, ALBUMIN,  in the last 168 hours No results found for this basename: LIPASE, AMYLASE,  in the last 168 hours No results found for this basename: AMMONIA,  in the last 168 hours CBC:  Recent Labs Lab 01/06/14 1652 01/07/14 0352  WBC 5.8 6.6  HGB 12.8 13.4  HCT 40.0 40.5  MCV 91.5 92.0  PLT 248 257   Cardiac Enzymes:  Recent Labs Lab 01/07/14 0352 01/07/14 0732  TROPONINI <0.30 <0.30   BNP (last 3 results)  Recent Labs  01/07/14 0352  PROBNP 68.6   CBG:  Recent Labs Lab 01/07/14 0025 01/07/14 0626 01/07/14 1111  GLUCAP 149* 93 68*    No results found for this or any previous visit (from the past 240 hour(s)).   Studies: Dg Chest 2 View  01/06/2014   CLINICAL DATA:  Chest pain.  EXAM: CHEST  2 VIEW  COMPARISON:  07/29/2013  FINDINGS: Heart size and pulmonary vascularity are normal and the lungs are clear. There is tortuosity of the thoracic aorta.  No acute osseous abnormality.  IMPRESSION: No acute disease.   Electronically Signed   By: Geanie Cooley M.D.   On: 01/06/2014 18:39    Scheduled Meds: . aspirin EC  81 mg Oral Daily  . cholecalciferol  1,000 Units Oral Daily  . enoxaparin (LOVENOX) injection  40 mg Subcutaneous Q24H  . estradiol  1 Applicatorful Vaginal Daily  . fluticasone  1 puff Inhalation BID  . glipiZIDE  10 mg Oral Q breakfast  . lisinopril  10 mg Oral Daily   And  . hydrochlorothiazide  12.5 mg Oral Daily  . insulin aspart  0-9 Units Subcutaneous TID WC  . nitroGLYCERIN  0.5 inch Topical 4 times per day  . pantoprazole   20 mg Oral Daily  . sodium chloride  3 mL Intravenous Q12H  . sodium chloride  3 mL Intravenous Q12H   Continuous Infusions:   Active Problems:   Chest pain    Time spent: More than 35 minutes    Penny Pia  [facility] Pager [phone]. If 7PM-7AM, please contact night-coverage at [email or web address], password [facility] 01/07/2014, 12:10 PM  LOS: 1 day

## 2014-01-07 NOTE — ED Provider Notes (Signed)
Medical screening examination/treatment/procedure(s) were performed by resident physician or non-physician practitioner and as supervising physician I was immediately available for consultation/collaboration.   Rossie Scarfone DOUGLAS MD.   Shakir Petrosino D Marylin Lathon, MD 01/07/14 2007 

## 2014-01-07 NOTE — Progress Notes (Signed)
UR completed 

## 2014-01-07 NOTE — Progress Notes (Signed)
Patient received from ED. Alert and oriented. No complaints of chestpain. SR on telemetry.

## 2014-01-08 ENCOUNTER — Observation Stay (HOSPITAL_COMMUNITY): Payer: Medicare Other

## 2014-01-08 DIAGNOSIS — R079 Chest pain, unspecified: Secondary | ICD-10-CM

## 2014-01-08 LAB — GLUCOSE, CAPILLARY
GLUCOSE-CAPILLARY: 76 mg/dL (ref 70–99)
Glucose-Capillary: 78 mg/dL (ref 70–99)
Glucose-Capillary: 111 mg/dL — ABNORMAL HIGH (ref 70–99)
Glucose-Capillary: 191 mg/dL — ABNORMAL HIGH (ref 70–99)
Glucose-Capillary: 156 mg/dL — ABNORMAL HIGH (ref 70–99)
Glucose-Capillary: 129 mg/dL — ABNORMAL HIGH (ref 70–99)

## 2014-01-08 MED ORDER — TECHNETIUM TC 99M SESTAMIBI GENERIC - CARDIOLITE
30.0000 | Freq: Once | INTRAVENOUS | Status: AC | PRN
  Administered 2014-01-08: 30 via INTRAVENOUS

## 2014-01-08 MED ORDER — TECHNETIUM TC 99M SESTAMIBI GENERIC - CARDIOLITE
10.0000 | Freq: Once | INTRAVENOUS | Status: AC | PRN
  Administered 2014-01-08: 10 via INTRAVENOUS

## 2014-01-08 MED ORDER — ATORVASTATIN CALCIUM 40 MG PO TABS
40.0000 mg | ORAL_TABLET | Freq: Every day | ORAL | Status: DC
  Administered 2014-01-08 – 2014-01-09 (×2): 40 mg via ORAL
  Filled 2014-01-08 (×3): qty 1

## 2014-01-08 MED ORDER — ONDANSETRON HCL 4 MG/2ML IJ SOLN
INTRAMUSCULAR | Status: AC
Start: 2014-01-08 — End: 2014-01-08
  Filled 2014-01-08: qty 2

## 2014-01-08 MED ORDER — LANSOPRAZOLE 30 MG PO CPDR: 30.0000 mg | DELAYED_RELEASE_CAPSULE | Freq: Every day | ORAL | Status: DC

## 2014-01-08 MED ORDER — SODIUM CHLORIDE 0.9 % IV SOLN
1.0000 mL/kg/h | INTRAVENOUS | Status: DC
  Administered 2014-01-09: 1 mL/kg/h via INTRAVENOUS

## 2014-01-08 MED ORDER — REGADENOSON 0.4 MG/5ML IV SOLN
0.4000 mg | Freq: Once | INTRAVENOUS | Status: AC
  Administered 2014-01-08: 0.4 mg via INTRAVENOUS

## 2014-01-08 MED ORDER — ASPIRIN 81 MG PO CHEW
81.0000 mg | CHEWABLE_TABLET | ORAL | Status: AC
  Administered 2014-01-09: 81 mg via ORAL
  Filled 2014-01-08: qty 1

## 2014-01-08 MED ORDER — REGADENOSON 0.4 MG/5ML IV SOLN
INTRAVENOUS | Status: AC
Start: 2014-01-08 — End: 2014-01-08
  Administered 2014-01-08: 0.4 mg via INTRAVENOUS
  Filled 2014-01-08: qty 5

## 2014-01-08 NOTE — Progress Notes (Signed)
Nuc results as below: Intermediate risk study. Partially reversible mid to distal anteroapical defect (SDS 5), which could represent ischemia or less likely shifting breast artifact. Additional fixed inferior and lateral attenuation artifact. LV EF 64% with no wall motion abnormalities. Chest pressure and left arm heaviness was noted with the administration of Lexiscan and subtle anterolateral ST depression and T-wave inversions were noted as well. Clinical correlation is advised.  D/w Dr. Eldridge DaceVaranasi. Cardiac cath recommended. Risks and benefits of cardiac catheterization have been discussed with the patient.  These include bleeding, infection, kidney damage, stroke, heart attack, death.  The patient understands these risks and is willing to proceed. She is familiar with the test due to multiple family members having it done before. This is scheduled for tomorrow. Hold off on full dose heparin unless recurrent pain or EKG changes. Will also check CMET in AM along with FLP and add statin. Dayna Dunn PA-C

## 2014-01-08 NOTE — Progress Notes (Signed)
Patient: [name] [name] / Admit Date: 01/06/2014 / Date of Encounter: 01/08/2014, 9:29 AM   Subjective  Had nausea and vomiting this AM but feeling better while in nuc. Tolerated Lexiscan CL without complication. C/o transient CP and L arm heaviness (no focal neuro sx) during test which resolved by end of test.   Objective   Telemetry: NSR/SB (transient HR upper 40's no significant pauses)  Physical Exam: Blood pressure 166/93, pulse 64, temperature 98.1 F (36.7 C), temperature source Oral, resp. rate 18, height 5\' 3"  (1.6 m), weight 195 lb 1.7 oz (88.5 kg), SpO2 97.00%. General: Well developed, well nourished AAF in no acute distress. Head: Normocephalic, atraumatic, sclera non-icteric, no xanthomas, nares are without discharge. Neck: Negative for carotid bruits. JVP not elevated. Lungs: Clear bilaterally to auscultation without wheezes, rales, or rhonchi. Breathing is unlabored. Heart: RRR S1 S2 without murmurs, rubs, or gallops.  Abdomen: Soft, non-tender, non-distended with normoactive bowel sounds. No rebound/guarding. Extremities: No clubbing or cyanosis. No edema. Distal pedal pulses are 2+ and equal bilaterally. Neuro: Alert and oriented X 3. Moves all extremities spontaneously. Psych:  Responds to questions appropriately with a reserved affect.   Intake/Output Summary (Last 24 hours) at 01/08/14 0929 Last data filed at 01/07/14 1700  Gross per 24 hour  Intake    480 ml  Output    200 ml  Net    280 ml    Inpatient Medications:  . aspirin EC  81 mg Oral Daily  . cholecalciferol  1,000 Units Oral Daily  . enoxaparin (LOVENOX) injection  40 mg Subcutaneous Q24H  . estradiol  1 Applicatorful Vaginal Daily  . fluticasone  1 puff Inhalation BID  . lisinopril  10 mg Oral Daily   And  . hydrochlorothiazide  12.5 mg Oral Daily  . insulin aspart  0-9 Units Subcutaneous TID WC  . nitroGLYCERIN  0.5 inch Topical 4 times per day  . ondansetron      . pantoprazole  20 mg Oral  Daily  . sodium chloride  3 mL Intravenous Q12H  . sodium chloride  3 mL Intravenous Q12H   Infusions:    Labs:  Recent Labs  01/06/14 1652 01/07/14 0352  NA 145  --   K 3.8  --   CL 106  --   CO2 27  --   GLUCOSE 69*  --   BUN 14  --   CREATININE 0.84 0.86  CALCIUM 9.3  --     Recent Labs  01/06/14 1652 01/07/14 0352  WBC 5.8 6.6  HGB 12.8 13.4  HCT 40.0 40.5  MCV 91.5 92.0  PLT 248 257    Recent Labs  01/07/14 0352 01/07/14 0732 01/07/14 1210  TROPONINI <0.30 <0.30 <0.30    Recent Labs  01/07/14 0352  HGBA1C 6.9*     Radiology/Studies:  Dg Chest 2 View 01/06/2014   CLINICAL DATA:  Chest pain.  EXAM: CHEST  2 VIEW  COMPARISON:  07/29/2013  FINDINGS: Heart size and pulmonary vascularity are normal and the lungs are clear. There is tortuosity of the thoracic aorta. No acute osseous abnormality.  IMPRESSION: No acute disease.   Electronically Signed   By: Geanie CooleyJim  Maxwell M.D.   On: 01/06/2014 18:39     Assessment and Plan  1. Chest pain a/w nausea and vomiting 2. Palpitations 3. HTN 3. Diabetes mellitus  Ruled out for MI, d-dimer normal. Await Lexiscan results. Tele unrevealing for pause of "fluttering" palpitations but patient reports this resolved  with NTG administration yesterday. If cardiac workup unremarkable, can consider GI workup. Can consider OP event monitor if fluttering recurs.  Signed, Ronie Spies PA-C  I have examined the patient and reviewed assessment and plan and discussed with patient.  Agree with above as stated.  Await results of nuclear.  Currently pain free.    [name] Schwenke S.

## 2014-01-09 ENCOUNTER — Encounter (HOSPITAL_COMMUNITY)
Admission: EM | Disposition: A | Discharge status: Home / Self Care | Payer: Self-pay | Source: Home / Self Care | Attending: Family Medicine

## 2014-01-09 ENCOUNTER — Ambulatory Visit (HOSPITAL_COMMUNITY): Admission: RE | Admit: 2014-01-09 | Payer: Medicare Other | Source: Ambulatory Visit | Admitting: Cardiology

## 2014-01-09 DIAGNOSIS — E876 Hypokalemia: Secondary | ICD-10-CM

## 2014-01-09 DIAGNOSIS — R9439 Abnormal result of other cardiovascular function study: Secondary | ICD-10-CM

## 2014-01-09 HISTORY — PX: LEFT HEART CATHETERIZATION WITH CORONARY ANGIOGRAM: SHX5451

## 2014-01-09 LAB — COMPREHENSIVE METABOLIC PANEL
ALBUMIN: 3.1 g/dL — AB (ref 3.5–5.2)
ALK PHOS: 82 U/L (ref 39–117)
ALT: 10 U/L (ref 0–35)
AST: 12 U/L (ref 0–37)
BUN: 14 mg/dL (ref 6–23)
CO2: 27 mEq/L (ref 19–32)
Calcium: 9.4 mg/dL (ref 8.4–10.5)
Chloride: 100 mEq/L (ref 96–112)
Creatinine, Ser: 0.89 mg/dL (ref 0.50–1.10)
GFR calc Af Amer: 75 mL/min — ABNORMAL LOW (ref >90)
GFR calc non Af Amer: 65 mL/min — ABNORMAL LOW (ref >90)
GLUCOSE: 131 mg/dL — AB (ref 70–99)
POTASSIUM: 3.7 meq/L (ref 3.7–5.3)
Sodium: 141 mEq/L (ref 137–147)
TOTAL PROTEIN: 6.7 g/dL (ref 6.0–8.3)
Total Bilirubin: 0.3 mg/dL (ref 0.3–1.2)

## 2014-01-09 LAB — GLUCOSE, CAPILLARY
GLUCOSE-CAPILLARY: 81 mg/dL (ref 70–99)
Glucose-Capillary: 106 mg/dL — ABNORMAL HIGH (ref 70–99)
Glucose-Capillary: 87 mg/dL (ref 70–99)
Glucose-Capillary: 99 mg/dL (ref 70–99)

## 2014-01-09 LAB — LIPID PANEL
CHOL/HDL RATIO: 3.1 ratio
Cholesterol: 140 mg/dL (ref 0–200)
HDL: 45 mg/dL (ref >39)
LDL Cholesterol: 77 mg/dL (ref 0–99)
Triglycerides: 92 mg/dL (ref <150)
VLDL: 18 mg/dL (ref 0–40)

## 2014-01-09 LAB — PROTIME-INR
INR: 0.94 (ref 0.00–1.49)
PROTHROMBIN TIME: 12.4 s (ref 11.6–15.2)

## 2014-01-09 SURGERY — LEFT HEART CATHETERIZATION WITH CORONARY ANGIOGRAM
Anesthesia: LOCAL

## 2014-01-09 MED ORDER — LIDOCAINE HCL (PF) 1 % IJ SOLN
INTRAMUSCULAR | Status: AC
  Filled 2014-01-09: qty 30

## 2014-01-09 MED ORDER — SODIUM CHLORIDE 0.9 % IV SOLN
INTRAVENOUS | Status: DC
  Administered 2014-01-09: via INTRAVENOUS

## 2014-01-09 MED ORDER — MIDAZOLAM HCL 2 MG/2ML IJ SOLN
INTRAMUSCULAR | Status: AC
  Filled 2014-01-09: qty 2

## 2014-01-09 MED ORDER — HEPARIN SODIUM (PORCINE) 1000 UNIT/ML IJ SOLN
INTRAMUSCULAR | Status: AC
  Filled 2014-01-09: qty 1

## 2014-01-09 MED ORDER — FENTANYL CITRATE 0.05 MG/ML IJ SOLN
INTRAMUSCULAR | Status: AC
  Filled 2014-01-09: qty 2

## 2014-01-09 MED ORDER — VERAPAMIL HCL 2.5 MG/ML IV SOLN
INTRAVENOUS | Status: AC
  Filled 2014-01-09: qty 2

## 2014-01-09 MED ORDER — NITROGLYCERIN 0.2 MG/ML ON CALL CATH LAB
INTRAVENOUS | Status: AC
Start: 2014-01-09 — End: 2014-01-09
  Filled 2014-01-09: qty 1

## 2014-01-09 MED ORDER — HEPARIN (PORCINE) IN NACL 2-0.9 UNIT/ML-% IJ SOLN
INTRAMUSCULAR | Status: AC
  Filled 2014-01-09: qty 1000

## 2014-01-09 MED ORDER — SODIUM CHLORIDE 0.9 % IV SOLN
INTRAVENOUS | Status: DC
  Administered 2014-01-09: 16:00:00 via INTRAVENOUS

## 2014-01-09 MED ORDER — SODIUM CHLORIDE 0.9 % IV SOLN
INTRAVENOUS | Status: AC
  Administered 2014-01-09: 16:00:00 via INTRAVENOUS

## 2014-01-09 NOTE — Progress Notes (Signed)
TR band removed at this time; dressing applied; site level 0; no c/o pain; will cont. To monitor.

## 2014-01-09 NOTE — H&P (View-Only) (Signed)
Patient Name: [name] [name] Date of Encounter: 01/09/2014  Active Problems:   Diabetes mellitus, type 2   Hypertension   Chest pain   Bradycardia   Length of Stay: 3  SUBJECTIVE  She is chest pain free and feels that the NTG patch is what mde the defference.  CURRENT MEDS . aspirin EC  81 mg Oral Daily  . atorvastatin  40 mg Oral q1800  . cholecalciferol  1,000 Units Oral Daily  . enoxaparin (LOVENOX) injection  40 mg Subcutaneous Q24H  . estradiol  1 Applicatorful Vaginal Daily  . fluticasone  1 puff Inhalation BID  . lisinopril  10 mg Oral Daily   And  . hydrochlorothiazide  12.5 mg Oral Daily  . insulin aspart  0-9 Units Subcutaneous TID WC  . nitroGLYCERIN  0.5 inch Topical 4 times per day  . pantoprazole  20 mg Oral Daily  . sodium chloride  3 mL Intravenous Q12H  . sodium chloride  3 mL Intravenous Q12H    OBJECTIVE   Intake/Output Summary (Last 24 hours) at 01/09/14 0811 Last data filed at 01/09/14 0700  Gross per 24 hour  Intake  662.9 ml  Output      0 ml  Net  662.9 ml   Filed Weights   01/07/14 0020  Weight: 88.5 kg (195 lb 1.7 oz)    PHYSICAL EXAM Filed Vitals:   01/08/14 1413 01/08/14 2100 01/08/14 2323 01/09/14 0559  BP: 139/75 132/74  132/87  Pulse: 80 85 82 71  Temp: 98.3 F (36.8 C) 98.4 F (36.9 C)  98.5 F (36.9 C)  TempSrc: Oral Oral  Oral  Resp: 18 18 16 17   Height:      Weight:      SpO2: 97% 97% 97% 97%   General: Alert, oriented x3, no distress Head: no evidence of trauma, PERRL, EOMI, no exophtalmos or lid lag, no myxedema, no xanthelasma; normal ears, nose and oropharynx Neck: normal jugular venous pulsations and no hepatojugular reflux; brisk carotid pulses without delay and no carotid bruits Chest: clear to auscultation, no signs of consolidation by percussion or palpation, normal fremitus, symmetrical and full respiratory excursions Cardiovascular: normal position and quality of the apical impulse, regular rhythm,  normal first and second heart sounds, no rubs or gallops, no murmur Abdomen: no tenderness or distention, no masses by palpation, no abnormal pulsatility or arterial bruits, normal bowel sounds, no hepatosplenomegaly Extremities: no clubbing, cyanosis or edema; 2+ radial, ulnar and brachial pulses bilaterally; 2+ right femoral, posterior tibial and dorsalis pedis pulses; 2+ left femoral, posterior tibial and dorsalis pedis pulses; no subclavian or femoral bruits Neurological: grossly nonfocal  LABS  CBC  Recent Labs  01/06/14 1652 01/07/14 0352  WBC 5.8 6.6  HGB 12.8 13.4  HCT 40.0 40.5  MCV 91.5 92.0  PLT 248 257   Basic Metabolic Panel  Recent Labs  01/06/14 1652 01/07/14 0352 01/09/14 0232  NA 145  --  141  K 3.8  --  3.7  CL 106  --  100  CO2 27  --  27  GLUCOSE 69*  --  131*  BUN 14  --  14  CREATININE 0.84 0.86 0.89  CALCIUM 9.3  --  9.4   Liver Function Tests  Recent Labs  01/09/14 0232  AST 12  ALT 10  ALKPHOS 82  BILITOT 0.3  PROT 6.7  ALBUMIN 3.1*   No results found for this basename: LIPASE, AMYLASE,  in the last 72  hours Cardiac Enzymes  Recent Labs  01/07/14 0352 01/07/14 0732 01/07/14 1210  TROPONINI <0.30 <0.30 <0.30   BNP No components found with this basename: POCBNP,  D-Dimer  Recent Labs  01/06/14 1855  DDIMER 0.45   Hemoglobin A1C  Recent Labs  01/07/14 0352  HGBA1C 6.9*   Fasting Lipid Panel  Recent Labs  01/09/14 0232  CHOL 140  HDL 45  LDLCALC 77  TRIG 92  CHOLHDL 3.1   Thyroid Function Tests No results found for this basename: TSH, T4TOTAL, FREET3, T3FREE, THYROIDAB,  in the last 72 hours  Radiology Studies Imaging results have been reviewed Myoview: Intermediate risk study. Partially reversible mid to distal  anteroapical defect (SDS 5), which could represent ischemia or less  likely shifting breast artifact. Additional fixed inferior and  lateral attenuation artifact. LV EF 64% with no wall motion   abnormalities. Chest pressure and left arm heaviness was noted with  the administration of Lexiscan and subtle anterolateral ST  depression and T-wave inversions were noted as well. Clinical  correlation is advised.   TELE NSR  ECG NSR, mild anterior T wave inversion  ASSESSMENT AND PLAN Discussed LHC/possible PCI (Dr. Tresa Endo will perform today). This procedure has been fully reviewed with the patient and written informed consent has been obtained.    Thurmon Fair, MD, [facility] [phone] office [phone] pager 01/09/2014 8:11 AM

## 2014-01-09 NOTE — Progress Notes (Signed)
[facility] PROGRESS NOTE  [name] [name] YQM:[id number] DOB: [birth year] DOA: 01/06/2014 PCP: [name] JennyRIPP, [name], MD   HPI/Subjective: Status post cardiac catheterization, clean coronaries per notes.  Assessment/Plan: Chest discomfort:  Has been present for 1 week. Associated with nausea, diaphoresis, and heart flutter per patient.  Worse with activity - Given above characteristics will consult cardiology to assess. Pt may require further testing of which I would like for cardiology to assess to see if they agree with other testing modalities given above characteristics of chest discomfort. -Cardiac cath showed no identifiable lesions.  Hypertension - Well controlled currently on lisinopril and hydrochlorothiazide  Diabetes mellitus -Continue sliding scale insulin. - Glipizide on hold  DVT prophylaxis -Continue Lovenox  Code Status: full Family Communication: No family at bedside  Disposition Plan: Home   Consultants:  Consulted cardiology  Procedures:  None  Antibiotics:  None   Objective: Filed Vitals:   01/09/14 1606  BP: 124/66  Pulse: 58  Temp:   Resp:     Intake/Output Summary (Last 24 hours) at 01/09/14 1624 Last data filed at 01/09/14 1107  Gross per 24 hour  Intake 787.23 ml  Output      0 ml  Net 787.23 ml   Filed Weights   01/07/14 0020  Weight: 88.5 kg (195 lb 1.7 oz)    Exam:   General:  Patient in no acute distress, alert and awake  Cardiovascular: Regular rate and rhythm, no murmur  Respiratory: Clear to auscultation bilaterally no wheezes  Abdomen: Soft, nontender, nondistended  Musculoskeletal: No chest discomfort on palpation, no cyanosis  Data Reviewed: Basic Metabolic Panel:  Recent Labs Lab 01/06/14 1652 01/07/14 0352 01/09/14 0232  NA 145  --  141  K 3.8  --  3.7  CL 106  --  100  CO2 27  --  27  GLUCOSE 69*  --  131*  BUN 14  --  14  CREATININE 0.84 0.86 0.89  CALCIUM 9.3  --  9.4   Liver Function  Tests:  Recent Labs Lab 01/09/14 0232  AST 12  ALT 10  ALKPHOS 82  BILITOT 0.3  PROT 6.7  ALBUMIN 3.1*   No results found for this basename: LIPASE, AMYLASE,  in the last 168 hours No results found for this basename: AMMONIA,  in the last 168 hours CBC:  Recent Labs Lab 01/06/14 1652 01/07/14 0352  WBC 5.8 6.6  HGB 12.8 13.4  HCT 40.0 40.5  MCV 91.5 92.0  PLT 248 257   Cardiac Enzymes:  Recent Labs Lab 01/07/14 0352 01/07/14 0732 01/07/14 1210  TROPONINI <0.30 <0.30 <0.30   BNP (last 3 results)  Recent Labs  01/07/14 0352  PROBNP 68.6   CBG:  Recent Labs Lab 01/08/14 2117 01/09/14 0601 01/09/14 1228 01/09/14 1506 01/09/14 1600  GLUCAP 129* 106* 87 81 99    No results found for this or any previous visit (from the past 240 hour(s)).   Studies: Nm Myocar Multi W/spect W/wall Motion / Ef  01/08/2014   HISTORY OF PRESENT ILLNESS: Nuclear Med Background  Indication for Stress Test:  Chest pain  History: 68 year old female presenting with 1 week history of chest pain episodes, 6-8/10 severity, nonradiating and constant symptoms.  Cardiac Risk Factors: Hypertension, diabetes, obesity  Symptoms:  Chest pain  PROCEDURE: Nuclear Pre-Procedure  Caffeine/Decaff Intake: None  NPO After: Yes.  Lungs:  O2 Stat:  IV 0.9% NS with Angio Cath:  Chest Size (in):  Cup Size: 4  Height:  5'3"  Weight:  195 lbs  BMI: 34.5  Tech Comments:  Nuclear Med Study  1 or 2 day study: 1  Stress Test Type: Regadenoson  Reading MD:  [name] [name]  Order Authorizing Provider:  Dr. Anne [name]  Resting Radionuclide: Sestamibi  Resting Radionuclide Dose:  10 mCi  Stress Radionuclide: Sestamibi  Stress Radionuclide Dose:  30 mCi  Stress Protocol  Rest HR: 71  Stress HR: 85  Rest BP: 177/92  Stress BP: 149/91  Exercise Time (min):  METS:  Dose of Adenosine (mg):  Dose of Lexiscan:  0.5 mg  Dose of Atropine (mg):  Dose of Dobutamine:  Stress Test Technologist:  Nuclear Technologist: Staff no  Rest Procedure:   Stress Procedure:  Transient Ischemic Dilatation (Normal <1.22): 1.03  Lung/Heart Ratio (Normal <0.45): 0.31  QGS EDV:  77 ml  QGS ESV:  28 ml  LV Ejection Fraction: 64%  Rest ECG:  Normal sinus rhythm with non-specific T wave changes  Stress ECG: <1 mm anterolateral ST depression and TWI after lexiscan, resolved in recovery  Raw Data Images: No motion artifact, significant liver uptake of radiotracer  Stress Images: Fixed the basal to mid inferior attenuation artifact. Mid and apical anterior defect.  Rest Images: Fixed basal to mid inferior attenuation artifact. Subtle apical defect  Subtraction (SDS):  5 - Extent 16% total  IMPRESSION: Exercise Capacity: N/A  BP Response:  Hypotensive response  Clinical Symptoms:  Chest pressure and left arm heaviness  ECG Impression: <1 mm anterolateral ST depression and TWI after lexiscan, resolved in recovery  Comparison with Prior Nuclear Study: none  Final Impression:  Intermediate risk study. Partially reversible mid to distal anteroapical defect (SDS 5), which could represent ischemia or less likely shifting breast artifact. Additional fixed inferior and lateral attenuation artifact. LV EF 64% with no wall motion abnormalities. Chest pressure and left arm heaviness was noted with the administration of Lexiscan and subtle anterolateral ST depression and T-wave inversions were noted as well. Clinical correlation is advised.   Electronically Signed   By: [name] [name]   On: 01/08/2014 14:36    Scheduled Meds: . aspirin EC  81 mg Oral Daily  . atorvastatin  40 mg Oral q1800  . cholecalciferol  1,000 Units Oral Daily  . enoxaparin (LOVENOX) injection  40 mg Subcutaneous Q24H  . estradiol  1 Applicatorful Vaginal Daily  . fluticasone  1 puff Inhalation BID  . lisinopril  10 mg Oral Daily   And  . hydrochlorothiazide  12.5 mg Oral Daily  . insulin aspart  0-9 Units Subcutaneous TID WC  . nitroGLYCERIN  0.5 inch Topical 4 times per day  . pantoprazole  20 mg Oral  Daily  . sodium chloride  3 mL Intravenous Q12H  . sodium chloride  3 mL Intravenous Q12H   Continuous Infusions: . sodium chloride 125 mL/hr at 01/09/14 1609   Followed by  . [START ON 01/10/2014] sodium chloride      Active Problems:   Diabetes mellitus, type 2   Hypertension   Chest pain   Bradycardia    Time spent: More than 35 minutes    [facility] A  [facility] Pager [phone] If 7PM-7AM, please contact night-coverage at [email or web address], password [facility] 01/09/2014, 4:24 PM  LOS: 3 days

## 2014-01-09 NOTE — Care Management Note (Unsigned)
    Page 1 of 1   01/09/2014     3:59:06 PM   CARE MANAGEMENT NOTE 01/09/2014  Patient:  [name] [name],[name] [name]   Account Number:  [id number]  Date Initiated:  01/09/2014  Documentation initiated by:  Ashur Glatfelter  Subjective/Objective Assessment:   PT ADM WITH CP, ABNORMAL STRESS TEST.  PTA, PT INDEPENDENT, LIVES WITH SON.     Action/Plan:   WILL FOLLOW FOR DC NEEDS AS PT PROGRESSES.   Anticipated DC Date:  01/10/2014   Anticipated DC Plan:  HOME/SELF CARE      DC Planning Services  CM consult      Choice offered to / List presented to:             Status of service:  In process, will continue to follow Medicare Important Message given?   (If response is "NO", the following Medicare IM given date fields will be blank) Date Medicare IM given:   Date Additional Medicare IM given:    Discharge Disposition:    Per UR Regulation:  Reviewed for med. necessity/level of care/duration of stay  If discussed at Long Length of Stay Meetings, dates discussed:    Comments:

## 2014-01-09 NOTE — Progress Notes (Signed)
O2 R thumb sats 100%;will cont. To monitor.

## 2014-01-09 NOTE — CV Procedure (Signed)
[name] [name] is a 68 y.o. female    [id number]  [id number] LOCATION:  FACILITY: [facility]  PHYSICIAN: Lennette Biharihomas A. Nelta Caudill, MD, [facility] 09-May-1946   DATE OF PROCEDURE:  01/09/2014    CARDIAC CATHETERIZATION     HISTORY:  [name] [name] is a 68 yo AAF history of diabetes mellitus, obesity, hypertension, GERD, remote CVA, who was admitted with recurrent episodes of chest pain. A nuclear perfusion study was interpreted as intermediate risk with partially reversible mid-to distal antral apical defect as well as possible fixed inferior lateral attenuation artifact. She has documented anterolateral ST T changes. She presents for definitive cardiac catheterization.   PROCEDURE:  The patient was brought to the second floor [facility] cath lab in the postabsorptive state. After modified Allen's test was positive right radial approach was used for the catheterization. Versed 2 mg and fentanyl 50 mcg were administered for conscious sedation. Her right radial artery was punctured by the Seldinger technique after topical lidocaine was ministered, and a 6 [place] gliding sheath slender was inserted about difficulty. A radial cocktail was administered consisting of nitroglycerin, verapamil, and lidocaine. A right catheter was advanced over safety J. wire but due to significant tortuosity in the region of the right subclavian and innominate region, the safety J wire was replaced with a Versicore wire and ultimately the catheter wire were able to be advanced into the descending aorta to the region of the costophrenic heparin 4000 units was administered. Selective angiography of the RCA was done utilizing the JR4 catheter. This was then exchanged for a 5 [place] FL4 catheter and selective angiography in the left coronary system was performed. This was then exchanged for a pigtail catheter the pigtail catheter was unable to navigate through the tortuosity in the innominate system. This was then removed and the right  catheter was reinserted which at this point also was unable to navigate the tortuosity. Additional 5 cc of radial cocktail was administered. Actually, a multipurpose 5 French catheter was inserted and this was able to navigate the region of vessel tortuosity and was advanced into the left ventricle. Left ventricular pressures were recorded and left ventriculography was performed with a hand injection. The catheter was then removed over the safety J wire. A Terumo radial band was applied at 1500 and 11 cc of air. The patient tolerated the procedure well. She returned her room in satisfactory condition.   HEMODYNAMICS:   Central Aorta: 130/73    Left Ventricle: 130/6  ANGIOGRAPHY:  1. Left main: This was essentially a common ostium which trifurcated into an LAD, a ramus intermediate vessel, and left circumflex coronary artery  2. LAD: Angiographically normal 3. Ramus Intermediate: Angiographically normal 4. Left circumflex: Angiographically normal.  5. Right coronary artery: Angiographically normal dominant right coronary artery  Left ventriculography revealed normal LV contractility with ejection fraction of at least 55-60% without focal segmental wall motion abnormalities.  IMPRESSION:  Normal LV function.  Normal coronary arteries.  Lennette Biharihomas A. Azari Hasler, MD, [facility] 01/09/2014 3:26 PM

## 2014-01-09 NOTE — Interval H&P Note (Signed)
Cath Lab Visit (complete for each Cath Lab visit)  Clinical Evaluation Leading to the Procedure:   ACS: no  Non-ACS:    Anginal Classification: CCS III  Anti-ischemic medical therapy: Minimal Therapy (1 class of medications)  Non-Invasive Test Results: Intermediate-risk stress test findings: cardiac mortality 1-3%/year  Prior CABG: No previous CABG      History and Physical Interval Note:  01/09/2014 1:41 PM  [name] [name]  has presented today for surgery, with the diagnosis of abnormal stress test  The various methods of treatment have been discussed with the patient and family. After consideration of risks, benefits and other options for treatment, the patient has consented to  Procedure(s): LEFT HEART CATHETERIZATION WITH CORONARY ANGIOGRAM (N/A) as a surgical intervention .  The patient's history has been reviewed, patient examined, no change in status, stable for surgery.  I have reviewed the patient's chart and labs.  Questions were answered to the patient's satisfaction.     Advay Volante A

## 2014-01-09 NOTE — Progress Notes (Signed)
Patient Name: [name] [name] Date of Encounter: 01/09/2014  Active Problems:   Diabetes mellitus, type 2   Hypertension   Chest pain   Bradycardia   Length of Stay: 3  SUBJECTIVE  She is chest pain free and feels that the NTG patch is what mde the defference.  CURRENT MEDS . aspirin EC  81 mg Oral Daily  . atorvastatin  40 mg Oral q1800  . cholecalciferol  1,000 Units Oral Daily  . enoxaparin (LOVENOX) injection  40 mg Subcutaneous Q24H  . estradiol  1 Applicatorful Vaginal Daily  . fluticasone  1 puff Inhalation BID  . lisinopril  10 mg Oral Daily   And  . hydrochlorothiazide  12.5 mg Oral Daily  . insulin aspart  0-9 Units Subcutaneous TID WC  . nitroGLYCERIN  0.5 inch Topical 4 times per day  . pantoprazole  20 mg Oral Daily  . sodium chloride  3 mL Intravenous Q12H  . sodium chloride  3 mL Intravenous Q12H    OBJECTIVE   Intake/Output Summary (Last 24 hours) at 01/09/14 0811 Last data filed at 01/09/14 0700  Gross per 24 hour  Intake  662.9 ml  Output      0 ml  Net  662.9 ml   Filed Weights   01/07/14 0020  Weight: 88.5 kg (195 lb 1.7 oz)    PHYSICAL EXAM Filed Vitals:   01/08/14 1413 01/08/14 2100 01/08/14 2323 01/09/14 0559  BP: 139/75 132/74  132/87  Pulse: 80 85 82 71  Temp: 98.3 F (36.8 C) 98.4 F (36.9 C)  98.5 F (36.9 C)  TempSrc: Oral Oral  Oral  Resp: 18 18 16 17   Height:      Weight:      SpO2: 97% 97% 97% 97%   General: Alert, oriented x3, no distress Head: no evidence of trauma, PERRL, EOMI, no exophtalmos or lid lag, no myxedema, no xanthelasma; normal ears, nose and oropharynx Neck: normal jugular venous pulsations and no hepatojugular reflux; brisk carotid pulses without delay and no carotid bruits Chest: clear to auscultation, no signs of consolidation by percussion or palpation, normal fremitus, symmetrical and full respiratory excursions Cardiovascular: normal position and quality of the apical impulse, regular rhythm,  normal first and second heart sounds, no rubs or gallops, no murmur Abdomen: no tenderness or distention, no masses by palpation, no abnormal pulsatility or arterial bruits, normal bowel sounds, no hepatosplenomegaly Extremities: no clubbing, cyanosis or edema; 2+ radial, ulnar and brachial pulses bilaterally; 2+ right femoral, posterior tibial and dorsalis pedis pulses; 2+ left femoral, posterior tibial and dorsalis pedis pulses; no subclavian or femoral bruits Neurological: grossly nonfocal  LABS  CBC  Recent Labs  01/06/14 1652 01/07/14 0352  WBC 5.8 6.6  HGB 12.8 13.4  HCT 40.0 40.5  MCV 91.5 92.0  PLT 248 257   Basic Metabolic Panel  Recent Labs  01/06/14 1652 01/07/14 0352 01/09/14 0232  NA 145  --  141  K 3.8  --  3.7  CL 106  --  100  CO2 27  --  27  GLUCOSE 69*  --  131*  BUN 14  --  14  CREATININE 0.84 0.86 0.89  CALCIUM 9.3  --  9.4   Liver Function Tests  Recent Labs  01/09/14 0232  AST 12  ALT 10  ALKPHOS 82  BILITOT 0.3  PROT 6.7  ALBUMIN 3.1*   No results found for this basename: LIPASE, AMYLASE,  in the last 72  hours Cardiac Enzymes  Recent Labs  01/07/14 0352 01/07/14 0732 01/07/14 1210  TROPONINI <0.30 <0.30 <0.30   BNP No components found with this basename: POCBNP,  D-Dimer  Recent Labs  01/06/14 1855  DDIMER 0.45   Hemoglobin A1C  Recent Labs  01/07/14 0352  HGBA1C 6.9*   Fasting Lipid Panel  Recent Labs  01/09/14 0232  CHOL 140  HDL 45  LDLCALC 77  TRIG 92  CHOLHDL 3.1   Thyroid Function Tests No results found for this basename: TSH, T4TOTAL, FREET3, T3FREE, THYROIDAB,  in the last 72 hours  Radiology Studies Imaging results have been reviewed Myoview: Intermediate risk study. Partially reversible mid to distal  anteroapical defect (SDS 5), which could represent ischemia or less  likely shifting breast artifact. Additional fixed inferior and  lateral attenuation artifact. LV EF 64% with no wall motion   abnormalities. Chest pressure and left arm heaviness was noted with  the administration of Lexiscan and subtle anterolateral ST  depression and T-wave inversions were noted as well. Clinical  correlation is advised.   TELE NSR  ECG NSR, mild anterior T wave inversion  ASSESSMENT AND PLAN Discussed LHC/possible PCI (Dr. Tresa Endo will perform today). This procedure has been fully reviewed with the patient and written informed consent has been obtained.    Thurmon Fair, MD, [facility] [phone] office [phone] pager 01/09/2014 8:11 AM

## 2014-01-09 NOTE — Progress Notes (Signed)
(Late Entry for PN) [facility] PROGRESS NOTE  [place] JWJ:[id number] DOB: [birth year] DOA: 01/06/2014 PCP: Florentina JennyRIPP, [name], MD   HPI/Subjective: No Pain waiting for stress test  Assessment/Plan: Chest discomfort:  Has been present for 1 week. Associated with nausea, diaphoresis, and heart flutter per patient.  Worse with activity - Given above characteristics will consult cardiology to assess. Pt may require further testing of which I would like for cardiology to assess to see if they agree with other testing modalities given above characteristics of chest discomfort. -Waiting for cards recommendations.  Hypertension - Well controlled currently on lisinopril and hydrochlorothiazide  Diabetes mellitus -Continue sliding scale insulin. - Glipizide on hold  DVT prophylaxis -Continue Lovenox  Code Status: full Family Communication: No family at bedside  Disposition Plan: Pending further work up, Cardiology consulted   Consultants:  Consulted cardiology  Procedures:  None  Antibiotics:  None   Objective: Filed Vitals:   01/09/14 1606  BP: 124/66  Pulse: 58  Temp:   Resp:     Intake/Output Summary (Last 24 hours) at 01/09/14 1615 Last data filed at 01/09/14 1107  Gross per 24 hour  Intake 787.23 ml  Output      0 ml  Net 787.23 ml   Filed Weights   01/07/14 0020  Weight: 88.5 kg (195 lb 1.7 oz)    Exam:   General:  Patient in no acute distress, alert and awake  Cardiovascular: Regular rate and rhythm, no murmur  Respiratory: Clear to auscultation bilaterally no wheezes  Abdomen: Soft, nontender, nondistended  Musculoskeletal: No chest discomfort on palpation, no cyanosis  Data Reviewed: Basic Metabolic Panel:  Recent Labs Lab 01/06/14 1652 01/07/14 0352 01/09/14 0232  NA 145  --  141  K 3.8  --  3.7  CL 106  --  100  CO2 27  --  27  GLUCOSE 69*  --  131*  BUN 14  --  14  CREATININE 0.84 0.86 0.89  CALCIUM 9.3  --  9.4    Liver Function Tests:  Recent Labs Lab 01/09/14 0232  AST 12  ALT 10  ALKPHOS 82  BILITOT 0.3  PROT 6.7  ALBUMIN 3.1*   No results found for this basename: LIPASE, AMYLASE,  in the last 168 hours No results found for this basename: AMMONIA,  in the last 168 hours CBC:  Recent Labs Lab 01/06/14 1652 01/07/14 0352  WBC 5.8 6.6  HGB 12.8 13.4  HCT 40.0 40.5  MCV 91.5 92.0  PLT 248 257   Cardiac Enzymes:  Recent Labs Lab 01/07/14 0352 01/07/14 0732 01/07/14 1210  TROPONINI <0.30 <0.30 <0.30   BNP (last 3 results)  Recent Labs  01/07/14 0352  PROBNP 68.6   CBG:  Recent Labs Lab 01/08/14 2117 01/09/14 0601 01/09/14 1228 01/09/14 1506 01/09/14 1600  GLUCAP 129* 106* 87 81 99    No results found for this or any previous visit (from the past 240 hour(s)).   Studies: Nm Myocar Multi W/spect W/wall Motion / Ef  01/08/2014   HISTORY OF PRESENT ILLNESS: Nuclear Med Background  Indication for Stress Test:  Chest pain  History: 68 year old female presenting with 1 week history of chest pain episodes, 6-8/10 severity, nonradiating and constant symptoms.  Cardiac Risk Factors: Hypertension, diabetes, obesity  Symptoms:  Chest pain  PROCEDURE: Nuclear Pre-Procedure  Caffeine/Decaff Intake: None  NPO After: Yes.  Lungs:  O2 Stat:  IV 0.9% NS with Angio Cath:  Chest Size (in):  Cup Size: 4  Height: 5'3"  Weight:  195 lbs  BMI: 34.5  Tech Comments:  Nuclear Med Study  1 or 2 day study: 1  Stress Test Type: Regadenoson  Reading MD:  Dr. Rennis Golden  Order Authorizing Provider:  Dr. Anne Fu  Resting Radionuclide: Sestamibi  Resting Radionuclide Dose:  10 mCi  Stress Radionuclide: Sestamibi  Stress Radionuclide Dose:  30 mCi  Stress Protocol  Rest HR: 71  Stress HR: 85  Rest BP: 177/92  Stress BP: 149/91  Exercise Time (min):  METS:  Dose of Adenosine (mg):  Dose of Lexiscan:  0.5 mg  Dose of Atropine (mg):  Dose of Dobutamine:  Stress Test Technologist:  Nuclear Technologist: Staff no   Rest Procedure:  Stress Procedure:  Transient Ischemic Dilatation (Normal <1.22): 1.03  Lung/Heart Ratio (Normal <0.45): 0.31  QGS EDV:  77 ml  QGS ESV:  28 ml  LV Ejection Fraction: 64%  Rest ECG:  Normal sinus rhythm with non-specific T wave changes  Stress ECG: <1 mm anterolateral ST depression and TWI after lexiscan, resolved in recovery  Raw Data Images: No motion artifact, significant liver uptake of radiotracer  Stress Images: Fixed the basal to mid inferior attenuation artifact. Mid and apical anterior defect.  Rest Images: Fixed basal to mid inferior attenuation artifact. Subtle apical defect  Subtraction (SDS):  5 - Extent 16% total  IMPRESSION: Exercise Capacity: N/A  BP Response:  Hypotensive response  Clinical Symptoms:  Chest pressure and left arm heaviness  ECG Impression: <1 mm anterolateral ST depression and TWI after lexiscan, resolved in recovery  Comparison with Prior Nuclear Study: none  Final Impression:  Intermediate risk study. Partially reversible mid to distal anteroapical defect (SDS 5), which could represent ischemia or less likely shifting breast artifact. Additional fixed inferior and lateral attenuation artifact. LV EF 64% with no wall motion abnormalities. Chest pressure and left arm heaviness was noted with the administration of Lexiscan and subtle anterolateral ST depression and T-wave inversions were noted as well. Clinical correlation is advised.   Electronically Signed   By: Chrystie Nose   On: 01/08/2014 14:36    Scheduled Meds: . aspirin EC  81 mg Oral Daily  . atorvastatin  40 mg Oral q1800  . cholecalciferol  1,000 Units Oral Daily  . enoxaparin (LOVENOX) injection  40 mg Subcutaneous Q24H  . estradiol  1 Applicatorful Vaginal Daily  . fluticasone  1 puff Inhalation BID  . lisinopril  10 mg Oral Daily   And  . hydrochlorothiazide  12.5 mg Oral Daily  . insulin aspart  0-9 Units Subcutaneous TID WC  . nitroGLYCERIN  0.5 inch Topical 4 times per day  .  pantoprazole  20 mg Oral Daily  . sodium chloride  3 mL Intravenous Q12H  . sodium chloride  3 mL Intravenous Q12H   Continuous Infusions: . sodium chloride 125 mL/hr at 01/09/14 1609   Followed by  . [START ON 01/10/2014] sodium chloride      Active Problems:   Diabetes mellitus, type 2   Hypertension   Chest pain   Bradycardia    Time spent: More than 35 minutes    [facility] A  [facility] Pager [phone] If 7PM-7AM, please contact night-coverage at [email or web address], password [facility] 01/09/2014, 4:15 PM  LOS: 3 days

## 2014-01-10 LAB — GLUCOSE, CAPILLARY: Glucose-Capillary: 106 mg/dL — ABNORMAL HIGH (ref 70–99)

## 2014-01-10 MED ORDER — ATORVASTATIN CALCIUM 20 MG PO TABS: 20.0000 mg | ORAL_TABLET | Freq: Every day | ORAL | Status: DC

## 2014-01-10 NOTE — Discharge Summary (Signed)
Physician Discharge Summary  [place] [name] [name]:[id number] DOB: 1946/09/30 DOA: 01/06/2014  PCP: Florentina Jenny, MD  Admit date: 01/06/2014 Discharge date: 01/10/2014  Time spent: 40 minutes  Recommendations for Outpatient Follow-up:  1. Followup with primary care physician within one week  Discharge Diagnoses:  Active Problems:   Diabetes mellitus, type 2   Hypertension   Chest pain   Bradycardia   Discharge Condition: Stable  Diet recommendation: Heart healthy/carbohydrate modified diet  Filed Weights   01/07/14 0020  Weight: 88.5 kg (195 lb 1.7 oz)    History of present illness:  [name] [name] is a 68 y.o. female, with past medical history significant for hypertension, diabetes mellitus, obesity and GERD presenting today with one-week history of chest pain episodes medical care daily with no precipitating or relieving factors. The pain is left-sided squeezing in nature associated with nausea, cold sweats and mild shortness of breath. The chest pain is rated between 6-8/10 and nonradiating.  No cough, fever or chills.  Today, the episode started prior to presentation and is still continuous around 6/10. Cardiology was contacted by the emergency room physician and it seems that she has a negative previous workup.  No history of leg swelling, dyspnea on exertion, but reports severe PNDs .    Hospital Course:   1. Chest pain, atypical: Left mid chest pain, present for about one week, reproducible with palpation. Patient did have some cold sweat and mild shortness of breath upon admission. Patient rated the pain about 6-8/10 but nonradiating. Patient seen by cardiology and after obtaining these medicines of cardiac enzymes patient undergone Myoview LexiScan a stress test and the results showed partially reversible mid to distal anteroapical defect (SDS 5), which could represent ischemia or less likely shifting breast artifact. Additional fixed inferior and lateral attenuation artifact.  LV EF 64% with no wall motion abnormalities. Chest pressure and left arm heaviness was noted with the administration of Lexiscan and subtle anterolateral ST depression and T-wave inversions were noted as well. So cardiology decided to perform cardiac catheterization which was done by Dr. Tresa Endo on 01/09/2014 and showed normal coronaries. On discharge patient placed on 20 mg of Lipitor, followup with primary care physician.   2. Diabetes mellitus 2: Glipizide was held at the time of admission because of blood sugar of 68, medication restarted at the time of discharge.  3. Hypertension: Well controlled currently on lisinopril and hydrochlorothiazide.  Procedures:  LexiScan Myoview, please see radiology report below.  Consultations:  [facility]  Discharge Exam: Filed Vitals:   01/10/14 0552  BP: 136/58  Pulse: 62  Temp: 98.1 F (36.7 C)  Resp: 17   General: Alert and awake, oriented x3, not in any acute distress. HEENT: anicteric sclera, pupils reactive to light and accommodation, EOMI CVS: S1-S2 clear, no murmur rubs or gallops Chest: clear to auscultation bilaterally, no wheezing, rales or rhonchi Abdomen: soft nontender, nondistended, normal bowel sounds, no organomegaly Extremities: no cyanosis, clubbing or edema noted bilaterally Neuro: Cranial nerves II-XII intact, no focal neurological deficits  Discharge Instructions  Discharge Orders   Future Orders Complete By Expires   Diet Carb Modified  As directed    Increase activity slowly  As directed        Medication List         albuterol 108 (90 BASE) MCG/ACT inhaler  Commonly known as:  PROVENTIL HFA;VENTOLIN HFA  Inhale 1-2 puffs into the lungs every 6 (six) hours as needed for wheezing.  aspirin EC 81 MG tablet  Take 81 mg by mouth daily.     atorvastatin 20 MG tablet  Commonly known as:  LIPITOR  Take 1 tablet (20 mg total) by mouth at bedtime.     beclomethasone 80 MCG/ACT inhaler  Commonly  known as:  QVAR  Inhale 2 puffs into the lungs 2 (two) times daily.     butalbital-acetaminophen-caffeine 50-325-40 MG per tablet  Commonly known as:  FIORICET, ESGIC  Take 1 tablet by mouth 2 (two) times daily as needed. For headache     cholecalciferol 1000 UNITS tablet  Commonly known as:  VITAMIN D  Take 1,000 Units by mouth daily.     estradiol 0.1 MG/GM vaginal cream  Commonly known as:  ESTRACE  1 gram per vagina 2 times per week at night     fluticasone 50 MCG/ACT nasal spray  Commonly known as:  FLONASE  Place 2 sprays into the nose daily as needed for rhinitis or allergies.     glipiZIDE 5 MG tablet  Commonly known as:  GLUCOTROL  Take 10 mg by mouth daily.     lansoprazole 30 MG capsule  Commonly known as:  PREVACID  Take 1 capsule (30 mg total) by mouth daily before breakfast.     lisinopril-hydrochlorothiazide 10-12.5 MG per tablet  Commonly known as:  PRINZIDE,ZESTORETIC  Take 1 tablet by mouth daily.       Allergies  Allergen Reactions  . Codeine Nausea And Vomiting  . Adhesive [Tape] Rash       Follow-up Information   Follow up with Florentina Jenny, MD In 1 week.   Specialty:  Family Medicine   Contact information:   [address] [place] [address] [phone]        The results of significant diagnostics from this hospitalization (including imaging, microbiology, ancillary and laboratory) are listed below for reference.    Significant Diagnostic Studies: Dg Chest 2 View  01/06/2014   CLINICAL DATA:  Chest pain.  EXAM: CHEST  2 VIEW  COMPARISON:  07/29/2013  FINDINGS: Heart size and pulmonary vascularity are normal and the lungs are clear. There is tortuosity of the thoracic aorta. No acute osseous abnormality.  IMPRESSION: No acute disease.   Electronically Signed   By: Geanie Cooley M.D.   On: 01/06/2014 18:39   Nm Myocar Multi W/spect W/wall Motion / Ef  01/08/2014   HISTORY OF PRESENT ILLNESS: Nuclear Med Background   Indication for Stress Test:  Chest pain  History: 68 year old female presenting with 1 week history of chest pain episodes, 6-8/10 severity, nonradiating and constant symptoms.  Cardiac Risk Factors: Hypertension, diabetes, obesity  Symptoms:  Chest pain  PROCEDURE: Nuclear Pre-Procedure  Caffeine/Decaff Intake: None  NPO After: Yes.  Lungs:  O2 Stat:  IV 0.9% NS with Angio Cath:  Chest Size (in):  Cup Size: 4  Height: 5'3"  Weight:  195 lbs  BMI: 34.5  Tech Comments:  Nuclear Med Study  1 or 2 day study: 1  Stress Test Type: Regadenoson  Reading MD:  Dr. Rennis Golden  Order Authorizing Provider:  Dr. Anne Fu  Resting Radionuclide: Sestamibi  Resting Radionuclide Dose:  10 mCi  Stress Radionuclide: Sestamibi  Stress Radionuclide Dose:  30 mCi  Stress Protocol  Rest HR: 71  Stress HR: 85  Rest BP: 177/92  Stress BP: 149/91  Exercise Time (min):  METS:  Dose of Adenosine (mg):  Dose of Lexiscan:  0.5 mg  Dose of  Atropine (mg):  Dose of Dobutamine:  Stress Test Technologist:  Nuclear Technologist: Staff no  Rest Procedure:  Stress Procedure:  Transient Ischemic Dilatation (Normal <1.22): 1.03  Lung/Heart Ratio (Normal <0.45): 0.31  QGS EDV:  77 ml  QGS ESV:  28 ml  LV Ejection Fraction: 64%  Rest ECG:  Normal sinus rhythm with non-specific T wave changes  Stress ECG: <1 mm anterolateral ST depression and TWI after lexiscan, resolved in recovery  Raw Data Images: No motion artifact, significant liver uptake of radiotracer  Stress Images: Fixed the basal to mid inferior attenuation artifact. Mid and apical anterior defect.  Rest Images: Fixed basal to mid inferior attenuation artifact. Subtle apical defect  Subtraction (SDS):  5 - Extent 16% total  IMPRESSION: Exercise Capacity: N/A  BP Response:  Hypotensive response  Clinical Symptoms:  Chest pressure and left arm heaviness  ECG Impression: <1 mm anterolateral ST depression and TWI after lexiscan, resolved in recovery  Comparison with Prior Nuclear Study: none  Final  Impression:  Intermediate risk study. Partially reversible mid to distal anteroapical defect (SDS 5), which could represent ischemia or less likely shifting breast artifact. Additional fixed inferior and lateral attenuation artifact. LV EF 64% with no wall motion abnormalities. Chest pressure and left arm heaviness was noted with the administration of Lexiscan and subtle anterolateral ST depression and T-wave inversions were noted as well. Clinical correlation is advised.   Electronically Signed   By: Chrystie NoseKenneth C Hilty   On: 01/08/2014 14:36    Microbiology: No results found for this or any previous visit (from the past 240 hour(s)).   Labs: Basic Metabolic Panel:  Recent Labs Lab 01/06/14 1652 01/07/14 0352 01/09/14 0232  NA 145  --  141  K 3.8  --  3.7  CL 106  --  100  CO2 27  --  27  GLUCOSE 69*  --  131*  BUN 14  --  14  CREATININE 0.84 0.86 0.89  CALCIUM 9.3  --  9.4   Liver Function Tests:  Recent Labs Lab 01/09/14 0232  AST 12  ALT 10  ALKPHOS 82  BILITOT 0.3  PROT 6.7  ALBUMIN 3.1*   No results found for this basename: LIPASE, AMYLASE,  in the last 168 hours No results found for this basename: AMMONIA,  in the last 168 hours CBC:  Recent Labs Lab 01/06/14 1652 01/07/14 0352  WBC 5.8 6.6  HGB 12.8 13.4  HCT 40.0 40.5  MCV 91.5 92.0  PLT 248 257   Cardiac Enzymes:  Recent Labs Lab 01/07/14 0352 01/07/14 0732 01/07/14 1210  TROPONINI <0.30 <0.30 <0.30   BNP: BNP (last 3 results)  Recent Labs  01/07/14 0352  PROBNP 68.6   CBG:  Recent Labs Lab 01/09/14 0601 01/09/14 1228 01/09/14 1506 01/09/14 1600 01/10/14 0701  GLUCAP 106* 87 81 99 106*       Signed:  Gianpaolo Mindel A  [facility] 01/10/2014, 8:19 AM

## 2014-01-10 NOTE — Progress Notes (Signed)
01/10/14 Patient given work note, prescriptions, and avs. Reviewed with patient all questions answered will discharge home as ordered. Lynnlee Revels, Randall AnKristin Jessup RN

## 2014-01-10 NOTE — Progress Notes (Signed)
SUBJECTIVE:  No complaints  OBJECTIVE:   Vitals:   Filed Vitals:   01/09/14 1651 01/09/14 1711 01/09/14 2055 01/10/14 0552  BP: 128/65 134/70 146/79 136/58  Pulse: 58  56 62  Temp:   98.1 F (36.7 C) 98.1 F (36.7 C)  TempSrc:   Oral Oral  Resp:   18 17  Height:      Weight:      SpO2:  100% 99% 100%   I&O's:   Intake/Output Summary (Last 24 hours) at 01/10/14 0759 Last data filed at 01/10/14 0747  Gross per 24 hour  Intake 2179.33 ml  Output      0 ml  Net 2179.33 ml   TELEMETRY: Reviewed telemetry pt in NSR:     PHYSICAL EXAM General: Well developed, well nourished, in no acute distress Head: Eyes PERRLA, No xanthomas.   Normal cephalic and atramatic  Lungs:   Clear bilaterally to auscultation and percussion. Heart:   HRRR S1 S2 Pulses are 2+ & equal. Abdomen: Bowel sounds are positive, abdomen soft and non-tender without masses Extremities:   No clubbing, cyanosis or edema.  DP +1 Neuro: Alert and oriented X 3. Psych:  Good affect, responds appropriately   LABS: Basic Metabolic Panel:  Recent Labs  16/10/96 0232  NA 141  K 3.7  CL 100  CO2 27  GLUCOSE 131*  BUN 14  CREATININE 0.89  CALCIUM 9.4   Liver Function Tests:  Recent Labs  01/09/14 0232  AST 12  ALT 10  ALKPHOS 82  BILITOT 0.3  PROT 6.7  ALBUMIN 3.1*   No results found for this basename: LIPASE, AMYLASE,  in the last 72 hours CBC: No results found for this basename: WBC, NEUTROABS, HGB, HCT, MCV, PLT,  in the last 72 hours Cardiac Enzymes:  Recent Labs  01/07/14 1210  TROPONINI <0.30   BNP: No components found with this basename: POCBNP,  D-Dimer: No results found for this basename: DDIMER,  in the last 72 hours Hemoglobin A1C: No results found for this basename: HGBA1C,  in the last 72 hours Fasting Lipid Panel:  Recent Labs  01/09/14 0232  CHOL 140  HDL 45  LDLCALC 77  TRIG 92  CHOLHDL 3.1   Thyroid Function Tests: No results found for this basename: TSH,  T4TOTAL, FREET3, T3FREE, THYROIDAB,  in the last 72 hours Anemia Panel: No results found for this basename: VITAMINB12, FOLATE, FERRITIN, TIBC, IRON, RETICCTPCT,  in the last 72 hours Coag Panel:   Lab Results  Component Value Date   INR 0.94 01/09/2014   INR 1.04 05/05/2012   INR 1.0 10/09/2008    RADIOLOGY: Dg Chest 2 View  01/06/2014   CLINICAL DATA:  Chest pain.  EXAM: CHEST  2 VIEW  COMPARISON:  07/29/2013  FINDINGS: Heart size and pulmonary vascularity are normal and the lungs are clear. There is tortuosity of the thoracic aorta. No acute osseous abnormality.  IMPRESSION: No acute disease.   Electronically Signed   By: Geanie Cooley M.D.   On: 01/06/2014 18:39   Nm Myocar Multi W/spect W/wall Motion / Ef  01/08/2014   HISTORY OF PRESENT ILLNESS: Nuclear Med Background  Indication for Stress Test:  Chest pain  History: 68 year old female presenting with 1 week history of chest pain episodes, 6-8/10 severity, nonradiating and constant symptoms.  Cardiac Risk Factors: Hypertension, diabetes, obesity  Symptoms:  Chest pain  PROCEDURE: Nuclear Pre-Procedure  Caffeine/Decaff Intake: None  NPO After: Yes.  Lungs:  O2 Stat:  IV 0.9% NS with Angio Cath:  Chest Size (in):  Cup Size: 4  Height: 5'3"  Weight:  195 lbs  BMI: 34.5  Tech Comments:  Nuclear Med Study  1 or 2 day study: 1  Stress Test Type: Regadenoson  Reading MD:  Dr. Rennis GoldenHilty  Order Authorizing Provider:  Dr. Anne FuSkains  Resting Radionuclide: Sestamibi  Resting Radionuclide Dose:  10 mCi  Stress Radionuclide: Sestamibi  Stress Radionuclide Dose:  30 mCi  Stress Protocol  Rest HR: 71  Stress HR: 85  Rest BP: 177/92  Stress BP: 149/91  Exercise Time (min):  METS:  Dose of Adenosine (mg):  Dose of Lexiscan:  0.5 mg  Dose of Atropine (mg):  Dose of Dobutamine:  Stress Test Technologist:  Nuclear Technologist: Staff no  Rest Procedure:  Stress Procedure:  Transient Ischemic Dilatation (Normal <1.22): 1.03  Lung/Heart Ratio (Normal <0.45): 0.31  QGS EDV:  77  ml  QGS ESV:  28 ml  LV Ejection Fraction: 64%  Rest ECG:  Normal sinus rhythm with non-specific T wave changes  Stress ECG: <1 mm anterolateral ST depression and TWI after lexiscan, resolved in recovery  Raw Data Images: No motion artifact, significant liver uptake of radiotracer  Stress Images: Fixed the basal to mid inferior attenuation artifact. Mid and apical anterior defect.  Rest Images: Fixed basal to mid inferior attenuation artifact. Subtle apical defect  Subtraction (SDS):  5 - Extent 16% total  IMPRESSION: Exercise Capacity: N/A  BP Response:  Hypotensive response  Clinical Symptoms:  Chest pressure and left arm heaviness  ECG Impression: <1 mm anterolateral ST depression and TWI after lexiscan, resolved in recovery  Comparison with Prior Nuclear Study: none  Final Impression:  Intermediate risk study. Partially reversible mid to distal anteroapical defect (SDS 5), which could represent ischemia or less likely shifting breast artifact. Additional fixed inferior and lateral attenuation artifact. LV EF 64% with no wall motion abnormalities. Chest pressure and left arm heaviness was noted with the administration of Lexiscan and subtle anterolateral ST depression and T-wave inversions were noted as well. Clinical correlation is advised.   Electronically Signed   By: Chrystie NoseKenneth C Hilty   On: 01/08/2014 14:36   Assessment and Plan   1. Chest pain a/w nausea and vomiting - ruled out for MI with enzymes and d-dimer normal - cath with normal coronary arteries 2. Palpitations  3. HTN  3. Diabetes mellitus   No further cardiac workup at this time.  May consider heart monitor outpt if palpitations reoccur Will sign off, call with any questions.     Quintella ReichertURNER,Ritchard Paragas R, MD  01/10/2014  7:59 AM

## 2014-06-03 ENCOUNTER — Encounter: Payer: Self-pay | Admitting: *Deleted

## 2014-06-23 ENCOUNTER — Other Ambulatory Visit: Payer: Self-pay | Admitting: Obstetrics & Gynecology

## 2014-07-03 ENCOUNTER — Emergency Department (INDEPENDENT_AMBULATORY_CARE_PROVIDER_SITE_OTHER)
Admission: EM | Admit: 2014-07-03 | Discharge: 2014-07-03 | Disposition: A | Discharge status: Home / Self Care | Payer: Medicare Other | Source: Home / Self Care | Attending: Family Medicine | Admitting: Family Medicine

## 2014-07-03 ENCOUNTER — Encounter (HOSPITAL_COMMUNITY): Payer: Self-pay | Admitting: Emergency Medicine

## 2014-07-03 DIAGNOSIS — M1712 Unilateral primary osteoarthritis, left knee: Secondary | ICD-10-CM

## 2014-07-03 DIAGNOSIS — M1710 Unilateral primary osteoarthritis, unspecified knee: Secondary | ICD-10-CM

## 2014-07-03 DIAGNOSIS — IMO0002 Reserved for concepts with insufficient information to code with codable children: Secondary | ICD-10-CM

## 2014-07-03 MED ORDER — MELOXICAM 7.5 MG PO TABS: 7.5000 mg | ORAL_TABLET | Freq: Every day | ORAL | Status: DC

## 2014-07-03 NOTE — ED Notes (Signed)
Patient c/o left leg pain x 2 weeks. This is a chronic problem. Patient reports she had an xray after a fall  in March which show arthritis and cartilage changes. Patient reports she has gotten steroid injections in the past with relief of symptoms. Patient is alert and oriented and in no acute distress.

## 2014-07-03 NOTE — Discharge Instructions (Signed)
Arthritis, Nonspecific °Arthritis is inflammation of a joint. This usually means pain, redness, warmth or swelling are present. One or more joints may be involved. There are a number of types of arthritis. Your caregiver may not be able to tell what type of arthritis you have right away. °CAUSES  °The most common cause of arthritis is the wear and tear on the joint (osteoarthritis). This causes damage to the cartilage, which can break down over time. The knees, hips, back and neck are most often affected by this type of arthritis. °Other types of arthritis and common causes of joint pain include: °· Sprains and other injuries near the joint. Sometimes minor sprains and injuries cause pain and swelling that develop hours later. °· Rheumatoid arthritis. This affects hands, feet and knees. It usually affects both sides of your body at the same time. It is often associated with chronic ailments, fever, weight loss and general weakness. °· Crystal arthritis. Gout and pseudo gout can cause occasional acute severe pain, redness and swelling in the foot, ankle, or knee. °· Infectious arthritis. Bacteria can get into a joint through a break in overlying skin. This can cause infection of the joint. Bacteria and viruses can also spread through the blood and affect your joints. °· Drug, infectious and allergy reactions. Sometimes joints can become mildly painful and slightly swollen with these types of illnesses. °SYMPTOMS  °· Pain is the main symptom. °· Your joint or joints can also be red, swollen and warm or hot to the touch. °· You may have a fever with certain types of arthritis, or even feel overall ill. °· The joint with arthritis will hurt with movement. Stiffness is present with some types of arthritis. °DIAGNOSIS  °Your caregiver will suspect arthritis based on your description of your symptoms and on your exam. Testing may be needed to find the type of arthritis: °· Blood and sometimes urine tests. °· X-ray tests  and sometimes CT or MRI scans. °· Removal of fluid from the joint (arthrocentesis) is done to check for bacteria, crystals or other causes. Your caregiver (or a specialist) will numb the area over the joint with a local anesthetic, and use a needle to remove joint fluid for examination. This procedure is only minimally uncomfortable. °· Even with these tests, your caregiver may not be able to tell what kind of arthritis you have. Consultation with a specialist (rheumatologist) may be helpful. °TREATMENT  °Your caregiver will discuss with you treatment specific to your type of arthritis. If the specific type cannot be determined, then the following general recommendations may apply. °Treatment of severe joint pain includes: °· Rest. °· Elevation. °· Anti-inflammatory medication (for example, ibuprofen) may be prescribed. Avoiding activities that cause increased pain. °· Only take over-the-counter or prescription medicines for pain and discomfort as recommended by your caregiver. °· Cold packs over an inflamed joint may be used for 10 to 15 minutes every hour. Hot packs sometimes feel better, but do not use overnight. Do not use hot packs if you are diabetic without your caregiver's permission. °· A cortisone shot into arthritic joints may help reduce pain and swelling. °· Any acute arthritis that gets worse over the next 1 to 2 days needs to be looked at to be sure there is no joint infection. °Long-term arthritis treatment involves modifying activities and lifestyle to reduce joint stress jarring. This can include weight loss. Also, exercise is needed to nourish the joint cartilage and remove waste. This helps keep the muscles   around the joint strong. °HOME CARE INSTRUCTIONS  °· Do not take aspirin to relieve pain if gout is suspected. This elevates uric acid levels. °· Only take over-the-counter or prescription medicines for pain, discomfort or fever as directed by your caregiver. °· Rest the joint as much as  possible. °· If your joint is swollen, keep it elevated. °· Use crutches if the painful joint is in your leg. °· Drinking plenty of fluids may help for certain types of arthritis. °· Follow your caregiver's dietary instructions. °· Try low-impact exercise such as: °¨ Swimming. °¨ Water aerobics. °¨ Biking. °¨ Walking. °· Morning stiffness is often relieved by a warm shower. °· Put your joints through regular range-of-motion. °SEEK MEDICAL CARE IF:  °· You do not feel better in 24 hours or are getting worse. °· You have side effects to medications, or are not getting better with treatment. °SEEK IMMEDIATE MEDICAL CARE IF:  °· You have a fever. °· You develop severe joint pain, swelling or redness. °· Many joints are involved and become painful and swollen. °· There is severe back pain and/or leg weakness. °· You have loss of bowel or bladder control. °Document Released: 12/14/2004 Document Revised: 01/29/2012 Document Reviewed: 12/30/2008 °ExitCare® Patient Information ©2015 [employer]. This information is not intended to replace advice given to you by your health care provider. Make sure you discuss any questions you have with your health care provider. ° °Knee Pain °The knee is the complex joint between your thigh and your lower leg. It is made up of bones, tendons, ligaments, and cartilage. The bones that make up the knee are: °· The femur in the thigh. °· The tibia and fibula in the lower leg. °· The patella or kneecap riding in the groove on the lower femur. °CAUSES  °Knee pain is a common complaint with many causes. A few of these causes are: °· Injury, such as: °¨ A ruptured ligament or tendon injury. °¨ Torn cartilage. °· Medical conditions, such as: °¨ Gout °¨ Arthritis °¨ Infections °· Overuse, over training, or overdoing a physical activity. °Knee pain can be minor or severe. Knee pain can accompany debilitating injury. Minor knee problems often respond well to self-care measures or get well on their  own. More serious injuries may need medical intervention or even surgery. °SYMPTOMS °The knee is complex. Symptoms of knee problems can vary widely. Some of the problems are: °· Pain with movement and weight bearing. °· Swelling and tenderness. °· Buckling of the knee. °· Inability to straighten or extend your knee. °· Your knee locks and you cannot straighten it. °· Warmth and redness with pain and fever. °· Deformity or dislocation of the kneecap. °DIAGNOSIS  °Determining what is wrong may be very straight forward such as when there is an injury. It can also be challenging because of the complexity of the knee. Tests to make a diagnosis may include: °· Your caregiver taking a history and doing a physical exam. °· Routine X-rays can be used to rule out other problems. X-rays will not reveal a cartilage tear. Some injuries of the knee can be diagnosed by: °¨ Arthroscopy a surgical technique by which a small video camera is inserted through tiny incisions on the sides of the knee. This procedure is used to examine and repair internal knee joint problems. Tiny instruments can be used during arthroscopy to repair the torn knee cartilage (meniscus). °¨ Arthrography is a radiology technique. A contrast liquid is directly injected into the knee   joint. Internal structures of the knee joint then become visible on X-ray film.  An MRI scan is a non X-ray radiology procedure in which magnetic fields and a computer produce two- or three-dimensional images of the inside of the knee. Cartilage tears are often visible using an MRI scanner. MRI scans have largely replaced arthrography in diagnosing cartilage tears of the knee.  Blood work.  Examination of the fluid that helps to lubricate the knee joint (synovial fluid). This is done by taking a sample out using a needle and a syringe. TREATMENT The treatment of knee problems depends on the cause. Some of these treatments are:  Depending on the injury, proper casting,  splinting, surgery, or physical therapy care will be needed.  Give yourself adequate recovery time. Do not overuse your joints. If you begin to get sore during workout routines, back off. Slow down or do fewer repetitions.  For repetitive activities such as cycling or running, maintain your strength and nutrition.  Alternate muscle groups. For example, if you are a weight lifter, work the upper body on one day and the lower body the next.  Either tight or weak muscles do not give the proper support for your knee. Tight or weak muscles do not absorb the stress placed on the knee joint. Keep the muscles surrounding the knee strong.  Take care of mechanical problems.  If you have flat feet, orthotics or special shoes may help. See your caregiver if you need help.  Arch supports, sometimes with wedges on the inner or outer aspect of the heel, can help. These can shift pressure away from the side of the knee most bothered by osteoarthritis.  A brace called an "unloader" brace also may be used to help ease the pressure on the most arthritic side of the knee.  If your caregiver has prescribed crutches, braces, wraps or ice, use as directed. The acronym for this is PRICE. This means protection, rest, ice, compression, and elevation.  Nonsteroidal anti-inflammatory drugs (NSAIDs), can help relieve pain. But if taken immediately after an injury, they may actually increase swelling. Take NSAIDs with food in your stomach. Stop them if you develop stomach problems. Do not take these if you have a history of ulcers, stomach pain, or bleeding from the bowel. Do not take without your caregiver's approval if you have problems with fluid retention, heart failure, or kidney problems.  For ongoing knee problems, physical therapy may be helpful.  Glucosamine and chondroitin are over-the-counter dietary supplements. Both may help relieve the pain of osteoarthritis in the knee. These medicines are different from  the usual anti-inflammatory drugs. Glucosamine may decrease the rate of cartilage destruction.  Injections of a corticosteroid drug into your knee joint may help reduce the symptoms of an arthritis flare-up. They may provide pain relief that lasts a few months. You may have to wait a few months between injections. The injections do have a small increased risk of infection, water retention, and elevated blood sugar levels.  Hyaluronic acid injected into damaged joints may ease pain and provide lubrication. These injections may work by reducing inflammation. A series of shots may give relief for as long as 6 months.  Topical painkillers. Applying certain ointments to your skin may help relieve the pain and stiffness of osteoarthritis. Ask your pharmacist for suggestions. Many over the-counter products are approved for temporary relief of arthritis pain.  In some countries, doctors often prescribe topical NSAIDs for relief of chronic conditions such as arthritis and tendinitis.  A review of treatment with NSAID creams found that they worked as well as oral medications but without the serious side effects. PREVENTION  Maintain a healthy weight. Extra pounds put more strain on your joints.  Get strong, stay limber. Weak muscles are a common cause of knee injuries. Stretching is important. Include flexibility exercises in your workouts.  Be smart about exercise. If you have osteoarthritis, chronic knee pain or recurring injuries, you may need to change the way you exercise. This does not mean you have to stop being active. If your knees ache after jogging or playing basketball, consider switching to swimming, water aerobics, or other low-impact activities, at least for a few days a week. Sometimes limiting high-impact activities will provide relief.  Make sure your shoes fit well. Choose footwear that is right for your sport.  Protect your knees. Use the proper gear for knee-sensitive activities. Use  kneepads when playing volleyball or laying carpet. Buckle your seat belt every time you drive. Most shattered kneecaps occur in car accidents.  Rest when you are tired. SEEK MEDICAL CARE IF:  You have knee pain that is continual and does not seem to be getting better.  SEEK IMMEDIATE MEDICAL CARE IF:  Your knee joint feels hot to the touch and you have a high fever. MAKE SURE YOU:   Understand these instructions.  Will watch your condition.  Will get help right away if you are not doing well or get worse. Document Released: 09/03/2007 Document Revised: 01/29/2012 Document Reviewed: 09/03/2007 [facility] Patient Information 2015 [place], [place]. This information is not intended to replace advice given to you by your health care provider. Make sure you discuss any questions you have with your health care provider.  Osteoarthritis Osteoarthritis is a disease that causes soreness and inflammation of a joint. It occurs when the cartilage at the affected joint wears down. Cartilage acts as a cushion, covering the ends of bones where they meet to form a joint. Osteoarthritis is the most common form of arthritis. It often occurs in older people. The joints affected most often by this condition include those in the:  Ends of the fingers.  Thumbs.  Neck.  Lower back.  Knees.  Hips. CAUSES  Over time, the cartilage that covers the ends of bones begins to wear away. This causes bone to rub on bone, producing pain and stiffness in the affected joints.  RISK FACTORS Certain factors can increase your chances of having osteoarthritis, including:  Older age.  Excessive body weight.  Overuse of joints.  Previous joint injury. SIGNS AND SYMPTOMS   Pain, swelling, and stiffness in the joint.  Over time, the joint may lose its normal shape.  Small deposits of bone (osteophytes) may grow on the edges of the joint.  Bits of bone or cartilage can break off and float inside the joint space.  This may cause more pain and damage. DIAGNOSIS  Your health care provider will do a physical exam and ask about your symptoms. Various tests may be ordered, such as:  X-rays of the affected joint.  An MRI scan.  Blood tests to rule out other types of arthritis.  Joint fluid tests. This involves using a needle to draw fluid from the joint and examining the fluid under a microscope. TREATMENT  Goals of treatment are to control pain and improve joint function. Treatment plans may include:  A prescribed exercise program that allows for rest and joint relief.  A weight control plan.  Pain relief  techniques, such as:  Properly applied heat and cold.  Electric pulses delivered to nerve endings under the skin (transcutaneous electrical nerve stimulation [TENS]).  Massage.  Certain nutritional supplements.  Medicines to control pain, such as:  Acetaminophen.  Nonsteroidal anti-inflammatory drugs (NSAIDs), such as naproxen.  Narcotic or central-acting agents, such as tramadol.  Corticosteroids. These can be given orally or as an injection.  Surgery to reposition the bones and relieve pain (osteotomy) or to remove loose pieces of bone and cartilage. Joint replacement may be needed in advanced states of osteoarthritis. HOME CARE INSTRUCTIONS   Take medicines only as directed by your health care provider.  Maintain a healthy weight. Follow your health care provider's instructions for weight control. This may include dietary instructions.  Exercise as directed. Your health care provider can recommend specific types of exercise. These may include:  Strengthening exercises. These are done to strengthen the muscles that support joints affected by arthritis. They can be performed with weights or with exercise bands to add resistance.  Aerobic activities. These are exercises, such as brisk walking or low-impact aerobics, that get your heart pumping.  Range-of-motion activities. These  keep your joints limber.  Balance and agility exercises. These help you maintain daily living skills.  Rest your affected joints as directed by your health care provider.  Keep all follow-up visits as directed by your health care provider. SEEK MEDICAL CARE IF:   Your skin turns red.  You develop a rash in addition to your joint pain.  You have worsening joint pain.  You have a fever along with joint or muscle aches. SEEK IMMEDIATE MEDICAL CARE IF:  You have a significant loss of weight or appetite.  You have night sweats. FOR MORE INFORMATION   National Institute of Arthritis and Musculoskeletal and Skin Diseases: www.niams.http://www.myers.net/nih.gov  [employer]: https://walker.com/www.nia.nih.gov  American College of Rheumatology: www.rheumatology.org Document Released: 11/06/2005 Document Revised: 03/23/2014 Document Reviewed: 07/14/2013 [facility] Patient Information 2015 [place], [place]. This information is not intended to replace advice given to you by your health care provider. Make sure you discuss any questions you have with your health care provider.

## 2014-07-03 NOTE — ED Provider Notes (Signed)
CSN: [id number]     Arrival date & time 07/03/14  1859 History   First MD Initiated Contact with Patient 07/03/14 1922     Chief Complaint  Patient presents with  . Leg Pain   (Consider location/radiation/quality/duration/timing/severity/associated sxs/prior Treatment) HPI Comments: Patient reports chronic left knee pain secondary to osteoarthritis. Is followed by Dr. Luiz Blare and recently underwent left knee MRI but she does not know results. Will be following up with Dr. Luiz Blare next week. Here requesting medication to help with knee pain.  No recent injury. No hx of gout No fever or joint swelling.   Patient is a 68 y.o. female presenting with leg pain. The history is provided by the patient.  Leg Pain   Past Medical History  Diagnosis Date  . Hypertension   . Diabetes mellitus   . Obesity (BMI 35.0-39.9 without comorbidity)   . GERD (gastroesophageal reflux disease)   . Chest pain 2009    MV with no scar or ischemia, EF 65%   Past Surgical History  Procedure Laterality Date  . Wisdom tooth extraction    . Cholecystectomy    . Temporomandibular joint surgery    . Abdominal hysterectomy  2006    Partial, one ovary left   . Cardiac [place]  [place] per pt.   Family History  Problem Relation Age of Onset  . Heart disease Father   . Diabetes Maternal Grandmother   . Pancreatitis Mother    History  Substance Use Topics  . Smoking status: Former Smoker -- 1.00 packs/day for .5 years    Types: Cigarettes    Quit date: 05/05/1969  . Smokeless tobacco: Never Used  . Alcohol Use: No   OB History   Grav Para Term Preterm Abortions TAB SAB Ect Mult Living   4 4 4  0 0 0 0 0 0 4     Obstetric Comments   Delivered 4 large babies vaginally     Review of Systems  All other systems reviewed and are negative.   Allergies  Codeine and Adhesive  Home Medications   Prior to Admission medications   Medication Sig Start Date End Date Taking?  Authorizing Provider  albuterol (PROVENTIL HFA;VENTOLIN HFA) 108 (90 BASE) MCG/ACT inhaler Inhale 1-2 puffs into the lungs every 6 (six) hours as needed for wheezing. 07/29/13   Reuben Likes, MD  aspirin EC 81 MG tablet Take 81 mg by mouth daily.    Historical Provider, MD  atorvastatin (LIPITOR) 20 MG tablet Take 1 tablet (20 mg total) by mouth at bedtime. 01/10/14   Clydia Llano, MD  beclomethasone (QVAR) 80 MCG/ACT inhaler Inhale 2 puffs into the lungs 2 (two) times daily. 07/29/13   Reuben Likes, MD  butalbital-acetaminophen-caffeine (FIORICET, ESGIC) [phone] MG per tablet Take 1 tablet by mouth 2 (two) times daily as needed. For headache    Historical Provider, MD  cholecalciferol (VITAMIN D) 1000 UNITS tablet Take 1,000 Units by mouth daily.    Historical Provider, MD  estradiol (ESTRACE) 0.1 MG/GM vaginal cream 1 gram per vagina 2 times per week at night 06/18/13   Allie Bossier, MD  fluticasone (FLONASE) 50 MCG/ACT nasal spray Place 2 sprays into the nose daily as needed for rhinitis or allergies.    Historical Provider, MD  glipiZIDE (GLUCOTROL) 5 MG tablet Take 10 mg by mouth daily.     Historical Provider, MD  lansoprazole (PREVACID) 30 MG capsule Take 1 capsule (30 mg  total) by mouth daily before breakfast. 01/08/14   Clydia LlanoMutaz Elmahi, MD  lisinopril-hydrochlorothiazide (PRINZIDE,ZESTORETIC) 10-12.5 MG per tablet Take 1 tablet by mouth daily.    Historical Provider, MD  meloxicam (MOBIC) 7.5 MG tablet Take 1 tablet (7.5 mg total) by mouth daily. 07/03/14   Jess BartersJennifer Lee H Denyce Harr, PA   BP 165/105  Pulse 92  Temp(Src) 98.3 F (36.8 C) (Oral)  Resp 18  SpO2 100% Physical Exam  Nursing note and vitals reviewed. Constitutional: She is oriented to person, place, and time. She appears well-developed and well-nourished. No distress.  HENT:  Head: Normocephalic and atraumatic.  Cardiovascular: Normal rate.   Pulmonary/Chest: Effort normal.  Musculoskeletal:       Left knee: She exhibits  effusion. She exhibits normal range of motion, no swelling, no ecchymosis, no deformity, no laceration, no erythema, normal alignment, no LCL laxity and normal patellar mobility. Tenderness found. Medial joint line and lateral joint line tenderness noted.  Neurological: She is alert and oriented to person, place, and time.  Skin: Skin is warm. No rash noted. No erythema.  Psychiatric: She has a normal mood and affect. Her behavior is normal.    ED Course  Procedures (including critical care time) Labs Review Labs Reviewed - No data to display  Imaging Review No results found.   MDM   1. Osteoarthritis of left knee, unspecified osteoarthritis type    Mobic as prescribed and follow up with Dr. Luiz BlareGraves as scheduled.   Ria ClockJennifer Lee H Flannery Cavallero, [place] 07/04/14 [phone]

## 2014-07-06 NOTE — ED Provider Notes (Signed)
Medical screening examination/treatment/procedure(s) were performed by a resident physician or non-physician practitioner and as the supervising physician I was immediately available for consultation/collaboration.  Jailynn Lavalais, MD    Willean Schurman S [place] Hanlon, MD 07/06/14 0749 

## 2014-09-20 ENCOUNTER — Encounter (HOSPITAL_COMMUNITY): Payer: Self-pay | Admitting: *Deleted

## 2014-09-20 ENCOUNTER — Emergency Department (HOSPITAL_COMMUNITY)
Admission: EM | Admit: 2014-09-20 | Discharge: 2014-09-20 | Disposition: A | Discharge status: Home / Self Care | Payer: Medicare Other | Attending: Emergency Medicine | Admitting: Emergency Medicine

## 2014-09-20 DIAGNOSIS — E119 Type 2 diabetes mellitus without complications: Secondary | ICD-10-CM | POA: Diagnosis not present

## 2014-09-20 DIAGNOSIS — Z87891 Personal history of nicotine dependence: Secondary | ICD-10-CM | POA: Diagnosis not present

## 2014-09-20 DIAGNOSIS — I10 Essential (primary) hypertension: Secondary | ICD-10-CM | POA: Diagnosis not present

## 2014-09-20 DIAGNOSIS — Z79899 Other long term (current) drug therapy: Secondary | ICD-10-CM | POA: Diagnosis not present

## 2014-09-20 DIAGNOSIS — Z7982 Long term (current) use of aspirin: Secondary | ICD-10-CM | POA: Insufficient documentation

## 2014-09-20 DIAGNOSIS — M549 Dorsalgia, unspecified: Secondary | ICD-10-CM | POA: Diagnosis present

## 2014-09-20 DIAGNOSIS — Z9889 Other specified postprocedural states: Secondary | ICD-10-CM | POA: Diagnosis not present

## 2014-09-20 DIAGNOSIS — Z7951 Long term (current) use of inhaled steroids: Secondary | ICD-10-CM | POA: Insufficient documentation

## 2014-09-20 DIAGNOSIS — M545 Low back pain, unspecified: Secondary | ICD-10-CM

## 2014-09-20 DIAGNOSIS — K219 Gastro-esophageal reflux disease without esophagitis: Secondary | ICD-10-CM | POA: Insufficient documentation

## 2014-09-20 MED ORDER — DIAZEPAM 5 MG/ML IJ SOLN
5.0000 mg | Freq: Once | INTRAMUSCULAR | Status: AC
  Administered 2014-09-20: 5 mg via INTRAMUSCULAR
  Filled 2014-09-20: qty 2

## 2014-09-20 MED ORDER — DIAZEPAM 5 MG PO TABS: 5.0000 mg | ORAL_TABLET | Freq: Two times a day (BID) | ORAL | Status: DC | PRN

## 2014-09-20 NOTE — ED Provider Notes (Signed)
CSN: [id number]     Arrival date & time 09/20/14  1345 History   First MD Initiated Contact with Patient 09/20/14 1501     Chief Complaint  Patient presents with  . Back Pain     (Consider location/radiation/quality/duration/timing/severity/associated sxs/prior Treatment) Patient is a 68 y.o. female presenting with back pain. The history is provided by the patient. No language interpreter was used.  Back Pain Location:  Lumbar spine Quality:  Aching and stiffness Stiffness is present:  All day Radiates to:  Does not radiate Pain severity:  Severe Pain is:  Same all the time Onset quality:  Gradual Duration:  2 days Timing:  Constant Progression:  Unchanged Chronicity:  Recurrent Context: lifting heavy objects and twisting   Relieved by:  OTC medications Associated symptoms: no abdominal pain, no bladder incontinence, no bowel incontinence, no dysuria, no fever, no leg pain, no numbness, no perianal numbness and no tingling   Risk factors: lack of exercise and obesity     Past Medical History  Diagnosis Date  . Hypertension   . Diabetes mellitus   . Obesity (BMI 35.0-39.9 without comorbidity)   . GERD (gastroesophageal reflux disease)   . Chest pain 2009    MV with no scar or ischemia, EF 65%   Past Surgical History  Procedure Laterality Date  . Wisdom tooth extraction    . Cholecystectomy    . Temporomandibular joint surgery    . Abdominal hysterectomy  2006    Partial, one ovary left   . Cardiac catheterization  2004    [place], [place] per pt.   Family History  Problem Relation Age of Onset  . Heart disease Father   . Diabetes Maternal Grandmother   . Pancreatitis Mother    History  Substance Use Topics  . Smoking status: Former Smoker -- 1.00 packs/day for .5 years    Types: Cigarettes    Quit date: 05/05/1969  . Smokeless tobacco: Never Used  . Alcohol Use: No   OB History    Gravida Para Term Preterm AB TAB SAB Ectopic Multiple Living   4 4 4  0  0 0 0 0 0 4      Obstetric Comments   Delivered 4 large babies vaginally     Review of Systems  Constitutional: Negative for fever and chills.  Gastrointestinal: Negative for abdominal pain and bowel incontinence.       No bowel incontinence  Genitourinary: Negative for bladder incontinence and dysuria.       No urinary incontinence  Musculoskeletal: Positive for myalgias, back pain and arthralgias.  Neurological: Negative for tingling and numbness.       No saddle anesthesia      Allergies  Codeine and Adhesive  Home Medications   Prior to Admission medications   Medication Sig Start Date End Date Taking? Authorizing Provider  albuterol (PROVENTIL HFA;VENTOLIN HFA) 108 (90 BASE) MCG/ACT inhaler Inhale 1-2 puffs into the lungs every 6 (six) hours as needed for wheezing. 07/29/13  Yes Reuben Likes, MD  aspirin EC 81 MG tablet Take 81 mg by mouth daily.   Yes Historical Provider, MD  beclomethasone (QVAR) 80 MCG/ACT inhaler Inhale 2 puffs into the lungs 2 (two) times daily. 07/29/13  Yes Reuben Likes, MD  butalbital-acetaminophen-caffeine (FIORICET, ESGIC) [phone] MG per tablet Take 1 tablet by mouth 2 (two) times daily as needed for headache.    Yes Historical Provider, MD  cholecalciferol (VITAMIN D) 1000 UNITS tablet Take 1,000 Units  by mouth daily.   Yes Historical Provider, MD  estradiol (ESTRACE) 0.1 MG/GM vaginal cream Place 1 Applicatorful vaginally 2 (two) times a week.   Yes Historical Provider, MD  glipiZIDE (GLUCOTROL) 5 MG tablet Take 10 mg by mouth daily.    Yes Historical Provider, MD  ibuprofen (ADVIL,MOTRIN) 200 MG tablet Take 200 mg by mouth every 6 (six) hours as needed for headache or mild pain.   Yes Historical Provider, MD  lansoprazole (PREVACID) 30 MG capsule Take 1 capsule (30 mg total) by mouth daily before breakfast. 01/08/14  Yes Clydia LlanoMutaz Elmahi, MD  lisinopril-hydrochlorothiazide (PRINZIDE,ZESTORETIC) 10-12.5 MG per tablet Take 1 tablet by mouth daily.    Yes Historical Provider, MD  meloxicam (MOBIC) 7.5 MG tablet Take 1 tablet (7.5 mg total) by mouth daily. 07/03/14  Yes Mathis FareJennifer Lee H Presson, PA  atorvastatin (LIPITOR) 20 MG tablet Take 1 tablet (20 mg total) by mouth at bedtime. 01/10/14   Clydia LlanoMutaz Elmahi, MD  ESTRACE VAGINAL 0.1 MG/GM vaginal cream PLACE 1 GRAM IN THE VAGINA 2 TIMES PER WEEK AT NIGHT 07/18/14   Allie BossierMyra C Dove, MD  fluticasone (FLONASE) 50 MCG/ACT nasal spray Place 2 sprays into the nose daily as needed for rhinitis or allergies.    Historical Provider, MD   BP 157/100 mmHg  Pulse 68  Temp(Src) 98.1 F (36.7 C) (Oral)  Resp 20  SpO2 97% Physical Exam  Constitutional: She is oriented to person, place, and time. She appears well-developed and well-nourished. No distress.  HENT:  Head: Normocephalic and atraumatic.  Eyes: Conjunctivae and EOM are normal. Right eye exhibits no discharge. Left eye exhibits no discharge. No scleral icterus.  Neck: Normal range of motion. Neck supple. No tracheal deviation present.  Cardiovascular: Normal rate, regular rhythm and normal heart sounds.  Exam reveals no gallop and no friction rub.   No murmur heard. Pulmonary/Chest: Effort normal and breath sounds normal. No respiratory distress. She has no wheezes.  Abdominal: Soft. She exhibits no distension. There is no tenderness.  Musculoskeletal: Normal range of motion.  Lumbar paraspinal muscles tender to palpation, no bony tenderness, step-offs, or gross abnormality or deformity of spine, patient is able to ambulate, moves all extremities  Bilateral great toe extension intact Bilateral plantar/dorsiflexion intact  Neurological: She is alert and oriented to person, place, and time. She has normal reflexes.  Sensation and strength intact bilaterally Symmetrical reflexes  Skin: Skin is warm. She is not diaphoretic.  Psychiatric: She has a normal mood and affect. Her behavior is normal. Judgment and thought content normal.  Nursing note and  vitals reviewed.   ED Course  Procedures (including critical care time) Labs Review Labs Reviewed - No data to display  Imaging Review No results found.   EKG Interpretation None      MDM   Final diagnoses:  Bilateral low back pain without sciatica   Patient with back pain.  Not a new problem.  Exacerbated with lifting and activity.  States that she normally takes a muscle relaxer when this happens, but she does not have any.  Will treat with valium. Patient refuses pain medication.  5:56 PM Patient states that she feels better with treatment in the ED.  Will discharge to home with a short course of valium.  Recommend PCP/ neurosurgery follow-up.  Patient understands and agrees with the plan.  Patient with back pain.  No neurological deficits and normal neuro exam.  Patient is ambulatory.  No loss of bowel or bladder control.  Doubt cauda equina.  Denies fever,  doubt epidural abscess or other lesion. Recommend back exercises, stretching, RICE, and will treat with a short course of abscess.  Encouraged the patient that there could be a need for additional workup and/or imaging such as MRI, if the symptoms do not resolve. Patient advised that if the back pain does not resolve, or radiates, this could progress to more serious conditions and is encouraged to follow-up with PCP or orthopedics within 2 weeks.     Roxy Horsemanobert Dinari Stgermaine, PA-C 09/20/14 1757

## 2014-09-20 NOTE — ED Notes (Signed)
Per EMS pt coming from home with c/o back pain that started after "light cleaning". Pt sts hx of back pain,pain is 10/10; 250 mcg fentanyl given en route with minimal relief.

## 2014-09-20 NOTE — ED Notes (Signed)
Pt ambulated in hall with one assist

## 2014-09-20 NOTE — Discharge Instructions (Signed)
Back Pain, Adult Low back pain is very common. About 1 in 5 people have back pain.The cause of low back pain is rarely dangerous. The pain often gets better over time.About half of people with a sudden onset of back pain feel better in just 2 weeks. About 8 in 10 people feel better by 6 weeks.  CAUSES Some common causes of back pain include:  Strain of the muscles or ligaments supporting the spine.  Wear and tear (degeneration) of the spinal discs.  Arthritis.  Direct injury to the back. DIAGNOSIS Most of the time, the direct cause of low back pain is not known.However, back pain can be treated effectively even when the exact cause of the pain is unknown.Answering your caregiver's questions about your overall health and symptoms is one of the most accurate ways to make sure the cause of your pain is not dangerous. If your caregiver needs more information, he or she may order lab work or imaging tests (X-rays or MRIs).However, even if imaging tests show changes in your back, this usually does not require surgery. HOME CARE INSTRUCTIONS For many people, back pain returns.Since low back pain is rarely dangerous, it is often a condition that people can learn to manageon their own.   Remain active. It is stressful on the back to sit or stand in one place. Do not sit, drive, or stand in one place for more than 30 minutes at a time. Take short walks on level surfaces as soon as pain allows.Try to increase the length of time you walk each day.  Do not stay in bed.Resting more than 1 or 2 days can delay your recovery.  Do not avoid exercise or work.Your body is made to move.It is not dangerous to be active, even though your back may hurt.Your back will likely heal faster if you return to being active before your pain is gone.  Pay attention to your body when you bend and lift. Many people have less discomfortwhen lifting if they bend their knees, keep the load close to their bodies,and  avoid twisting. Often, the most comfortable positions are those that put less stress on your recovering back.  Find a comfortable position to sleep. Use a firm mattress and lie on your side with your knees slightly bent. If you lie on your back, put a pillow under your knees.  Only take over-the-counter or prescription medicines as directed by your caregiver. Over-the-counter medicines to reduce pain and inflammation are often the most helpful.Your caregiver may prescribe muscle relaxant drugs.These medicines help dull your pain so you can more quickly return to your normal activities and healthy exercise.  Put ice on the injured area.  Put ice in a plastic bag.  Place a towel between your skin and the bag.  Leave the ice on for 15-20 minutes, 03-04 times a day for the first 2 to 3 days. After that, ice and heat may be alternated to reduce pain and spasms.  Ask your caregiver about trying back exercises and gentle massage. This may be of some benefit.  Avoid feeling anxious or stressed.Stress increases muscle tension and can worsen back pain.It is important to recognize when you are anxious or stressed and learn ways to manage it.Exercise is a great option. SEEK MEDICAL CARE IF:  You have pain that is not relieved with rest or medicine.  You have pain that does not improve in 1 week.  You have new symptoms.  You are generally not feeling well. SEEK   IMMEDIATE MEDICAL CARE IF:   You have pain that radiates from your back into your legs.  You develop new bowel or bladder control problems.  You have unusual weakness or numbness in your arms or legs.  You develop nausea or vomiting.  You develop abdominal pain.  You feel faint. Document Released: 11/06/2005 Document Revised: 05/07/2012 Document Reviewed: 03/10/2014 Exit[employer] Patient Information 2015 [employer]. This information is not intended to replace advice given to you by your health care provider. Make sure you  discuss any questions you have with your health care provider.  

## 2014-09-21 ENCOUNTER — Encounter (HOSPITAL_COMMUNITY): Payer: Self-pay | Admitting: *Deleted

## 2014-10-29 ENCOUNTER — Encounter (HOSPITAL_COMMUNITY): Payer: Self-pay | Admitting: Cardiology

## 2014-11-26 ENCOUNTER — Other Ambulatory Visit: Payer: Self-pay | Admitting: *Deleted

## 2014-11-26 DIAGNOSIS — N951 Menopausal and female climacteric states: Secondary | ICD-10-CM

## 2014-11-26 NOTE — Telephone Encounter (Signed)
[place] and left a message she is calling to get her estrace refilled. States [facility] said they had to talk to doctor first. I am completely out- please call some in to [facility] at [phone].  [place] may be reached at [phone] or cell [phone]

## 2014-11-26 NOTE — Telephone Encounter (Signed)
Called [place] and notified her that we got her message and Dr. Marice Potterove not here today, but I have sent a message requesting a refill ; once we hear from Dr. Marice Potterove , we will call her back.

## 2014-12-02 MED ORDER — ESTRADIOL 0.1 MG/GM VA CREA: TOPICAL_CREAM | VAGINAL | Status: DC

## 2014-12-02 NOTE — Telephone Encounter (Signed)
Received approval from Dr. Marice Potterove for refill. Sent refill to pharmacy. Called [place] and left message prescription refilled and should be available after dinner. Call if any other problems.

## 2015-01-06 ENCOUNTER — Encounter: Payer: Self-pay | Admitting: Obstetrics & Gynecology

## 2015-01-06 ENCOUNTER — Other Ambulatory Visit: Payer: Self-pay | Admitting: Obstetrics & Gynecology

## 2015-01-06 ENCOUNTER — Ambulatory Visit (INDEPENDENT_AMBULATORY_CARE_PROVIDER_SITE_OTHER): Payer: Self-pay | Admitting: Obstetrics & Gynecology

## 2015-01-06 VITALS — BP 167/91 | HR 79 | Temp 98.2°F | Ht 63.0 in | Wt 198.5 lb

## 2015-01-06 DIAGNOSIS — IMO0002 Reserved for concepts with insufficient information to code with codable children: Secondary | ICD-10-CM

## 2015-01-06 DIAGNOSIS — Z0000 Encounter for general adult medical examination without abnormal findings: Secondary | ICD-10-CM

## 2015-01-06 DIAGNOSIS — N8110 Cystocele, unspecified: Secondary | ICD-10-CM

## 2015-01-06 DIAGNOSIS — N993 Prolapse of vaginal vault after hysterectomy: Secondary | ICD-10-CM

## 2015-01-06 DIAGNOSIS — Z1231 Encounter for screening mammogram for malignant neoplasm of breast: Secondary | ICD-10-CM

## 2015-01-06 MED ORDER — CYCLOBENZAPRINE HCL 10 MG PO TABS
10.0000 mg | ORAL_TABLET | Freq: Three times a day (TID) | ORAL | Status: DC | PRN
Start: 2015-01-06 — End: 2015-04-22

## 2015-01-06 NOTE — Progress Notes (Signed)
   Subjective:    Patient ID: [name] [name], female    DOB: November 07, 1946, 69 y.o.   MRN: [id number]  HPI  69 yo lady here for a pessary check. 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 She has been using a #5 ring pessary for the last 18 months. She removes it at night. She is having no problems. She is using vaginal estrogen about 2 times per week.  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

## 2015-01-07 LAB — WET PREP, GENITAL
Clue Cells Wet Prep HPF POC: NONE SEEN
TRICH WET PREP: NONE SEEN
WBC, Wet Prep HPF POC: NONE SEEN
Yeast Wet Prep HPF POC: NONE SEEN

## 2015-01-18 ENCOUNTER — Ambulatory Visit: Payer: Medicare Other

## 2015-01-25 ENCOUNTER — Encounter (INDEPENDENT_AMBULATORY_CARE_PROVIDER_SITE_OTHER): Payer: Self-pay

## 2015-01-25 ENCOUNTER — Ambulatory Visit: Payer: Self-pay

## 2015-01-25 ENCOUNTER — Ambulatory Visit
Admission: RE | Admit: 2015-01-25 | Discharge: 2015-01-25 | Disposition: A | Discharge status: Home / Self Care | Payer: Self-pay | Source: Ambulatory Visit | Attending: Obstetrics & Gynecology | Admitting: Obstetrics & Gynecology

## 2015-01-25 DIAGNOSIS — Z1231 Encounter for screening mammogram for malignant neoplasm of breast: Secondary | ICD-10-CM

## 2015-03-07 ENCOUNTER — Emergency Department (HOSPITAL_COMMUNITY)
Admission: EM | Admit: 2015-03-07 | Discharge: 2015-03-07 | Disposition: A | Discharge status: Home / Self Care | Payer: Medicare Other | Attending: Emergency Medicine | Admitting: Emergency Medicine

## 2015-03-07 ENCOUNTER — Emergency Department (HOSPITAL_COMMUNITY): Payer: Medicare Other

## 2015-03-07 ENCOUNTER — Encounter (HOSPITAL_COMMUNITY): Payer: Self-pay | Admitting: Emergency Medicine

## 2015-03-07 DIAGNOSIS — I10 Essential (primary) hypertension: Secondary | ICD-10-CM | POA: Diagnosis not present

## 2015-03-07 DIAGNOSIS — R51 Headache: Secondary | ICD-10-CM | POA: Diagnosis present

## 2015-03-07 DIAGNOSIS — E119 Type 2 diabetes mellitus without complications: Secondary | ICD-10-CM | POA: Insufficient documentation

## 2015-03-07 DIAGNOSIS — Z87891 Personal history of nicotine dependence: Secondary | ICD-10-CM | POA: Insufficient documentation

## 2015-03-07 DIAGNOSIS — R5382 Chronic fatigue, unspecified: Secondary | ICD-10-CM

## 2015-03-07 DIAGNOSIS — M791 Myalgia: Secondary | ICD-10-CM | POA: Insufficient documentation

## 2015-03-07 DIAGNOSIS — Z79899 Other long term (current) drug therapy: Secondary | ICD-10-CM | POA: Diagnosis not present

## 2015-03-07 DIAGNOSIS — R42 Dizziness and giddiness: Secondary | ICD-10-CM | POA: Insufficient documentation

## 2015-03-07 DIAGNOSIS — R519 Headache, unspecified: Secondary | ICD-10-CM

## 2015-03-07 DIAGNOSIS — R531 Weakness: Secondary | ICD-10-CM | POA: Insufficient documentation

## 2015-03-07 DIAGNOSIS — Z7982 Long term (current) use of aspirin: Secondary | ICD-10-CM | POA: Diagnosis not present

## 2015-03-07 DIAGNOSIS — E669 Obesity, unspecified: Secondary | ICD-10-CM | POA: Insufficient documentation

## 2015-03-07 DIAGNOSIS — K219 Gastro-esophageal reflux disease without esophagitis: Secondary | ICD-10-CM | POA: Diagnosis not present

## 2015-03-07 DIAGNOSIS — R29898 Other symptoms and signs involving the musculoskeletal system: Secondary | ICD-10-CM

## 2015-03-07 LAB — DIFFERENTIAL
Basophils Absolute: 0 10*3/uL (ref 0.0–0.1)
Basophils Relative: 0 % (ref 0–1)
Eosinophils Absolute: 0.1 10*3/uL (ref 0.0–0.7)
Eosinophils Relative: 2 % (ref 0–5)
Lymphocytes Relative: 46 % (ref 12–46)
Lymphs Abs: 3.2 10*3/uL (ref 0.7–4.0)
Monocytes Absolute: 0.3 10*3/uL (ref 0.1–1.0)
Monocytes Relative: 4 % (ref 3–12)
Neutro Abs: 3.4 10*3/uL (ref 1.7–7.7)
Neutrophils Relative %: 48 % (ref 43–77)

## 2015-03-07 LAB — I-STAT CHEM 8, ED
BUN: 19 mg/dL (ref 6–23)
Calcium, Ion: 1.15 mmol/L (ref 1.13–1.30)
Chloride: 102 mmol/L (ref 96–112)
Creatinine, Ser: 0.9 mg/dL (ref 0.50–1.10)
Glucose, Bld: 92 mg/dL (ref 70–99)
HCT: 48 % — ABNORMAL HIGH (ref 36.0–46.0)
Hemoglobin: 16.3 g/dL — ABNORMAL HIGH (ref 12.0–15.0)
Potassium: 3.6 mmol/L (ref 3.5–5.1)
Sodium: 140 mmol/L (ref 135–145)
TCO2: 25 mmol/L (ref 0–100)

## 2015-03-07 LAB — COMPREHENSIVE METABOLIC PANEL
ALT: 14 U/L (ref 0–35)
AST: 20 U/L (ref 0–37)
Albumin: 3.8 g/dL (ref 3.5–5.2)
Alkaline Phosphatase: 91 U/L (ref 39–117)
Anion gap: 13 (ref 5–15)
BUN: 14 mg/dL (ref 6–23)
CO2: 23 mmol/L (ref 19–32)
Calcium: 9.4 mg/dL (ref 8.4–10.5)
Chloride: 103 mmol/L (ref 96–112)
Creatinine, Ser: 0.91 mg/dL (ref 0.50–1.10)
GFR calc Af Amer: 73 mL/min — ABNORMAL LOW (ref >90)
GFR calc non Af Amer: 63 mL/min — ABNORMAL LOW (ref >90)
Glucose, Bld: 90 mg/dL (ref 70–99)
Potassium: 3.6 mmol/L (ref 3.5–5.1)
Sodium: 139 mmol/L (ref 135–145)
Total Bilirubin: 0.6 mg/dL (ref 0.3–1.2)
Total Protein: 7.1 g/dL (ref 6.0–8.3)

## 2015-03-07 LAB — CBC
HCT: 43.8 % (ref 36.0–46.0)
Hemoglobin: 14.4 g/dL (ref 12.0–15.0)
MCH: 30.1 pg (ref 26.0–34.0)
MCHC: 32.9 g/dL (ref 30.0–36.0)
MCV: 91.6 fL (ref 78.0–100.0)
Platelets: 256 10*3/uL (ref 150–400)
RBC: 4.78 MIL/uL (ref 3.87–5.11)
RDW: 14.5 % (ref 11.5–15.5)
WBC: 7 10*3/uL (ref 4.0–10.5)

## 2015-03-07 LAB — APTT: aPTT: 30 seconds (ref 24–37)

## 2015-03-07 LAB — I-STAT TROPONIN, ED: Troponin i, poc: 0 ng/mL (ref 0.00–0.08)

## 2015-03-07 LAB — URINALYSIS, ROUTINE W REFLEX MICROSCOPIC
Bilirubin Urine: NEGATIVE
GLUCOSE, UA: NEGATIVE mg/dL
HGB URINE DIPSTICK: NEGATIVE
Ketones, ur: NEGATIVE mg/dL
NITRITE: NEGATIVE
PH: 6.5 (ref 5.0–8.0)
Protein, ur: NEGATIVE mg/dL
SPECIFIC GRAVITY, URINE: 1.009 (ref 1.005–1.030)
Urobilinogen, UA: 1 mg/dL (ref 0.0–1.0)

## 2015-03-07 LAB — URINE MICROSCOPIC-ADD ON

## 2015-03-07 LAB — PROTIME-INR
INR: 0.95 (ref 0.00–1.49)
Prothrombin Time: 12.8 seconds (ref 11.6–15.2)

## 2015-03-07 LAB — CBG MONITORING, ED: Glucose-Capillary: 109 mg/dL — ABNORMAL HIGH (ref 70–99)

## 2015-03-07 MED ORDER — IBUPROFEN 400 MG PO TABS
600.0000 mg | ORAL_TABLET | Freq: Once | ORAL | Status: AC
  Administered 2015-03-07: 600 mg via ORAL
  Filled 2015-03-07 (×2): qty 1

## 2015-03-07 NOTE — ED Notes (Signed)
Pt states she fell while carrying groceries inside on Friday.  Denies tripping or feeling dizzy prior to fall.  States she just fell.  Since falling pt c/o tingling in bilateral legs and R knee pain.  Pt states she was disoriented this morning when she woke up.  Reports nausea, headache, and dizziness.  Bilateral grips strong and equal.  No neuro deficits noted on triage exam.  Pt reports generalized weakness.

## 2015-03-07 NOTE — ED Notes (Signed)
Pt very dizzy when standing to use bedside commode. Helped pt back to bed, pt states the room is spinning

## 2015-03-07 NOTE — ED Provider Notes (Signed)
CSN: [id number]     Arrival date & time 03/07/15  1557 History   First MD Initiated Contact with Patient 03/07/15 1651     Chief Complaint  Patient presents with  . Dizziness  . Fall  . Headache  . Nausea   (Consider location/radiation/quality/duration/timing/severity/associated sxs/prior Treatment) Patient is a 69 y.o. female presenting with weakness. The history is provided by the patient. No language interpreter was used.  Weakness This is a new problem. The current episode started yesterday. The problem has been unchanged. Associated symptoms include fatigue, headaches, myalgias and weakness. Pertinent negatives include no abdominal pain, arthralgias, change in bowel habit, chest pain, congestion, coughing, diaphoresis, fever, nausea, numbness, rash or vomiting. Nothing aggravates the symptoms. She has tried nothing for the symptoms.    Past Medical History  Diagnosis Date  . Hypertension   . Diabetes mellitus   . Obesity (BMI 35.0-39.9 without comorbidity)   . GERD (gastroesophageal reflux disease)   . Chest pain 2009    MV with no scar or ischemia, EF 65%   Past Surgical History  Procedure Laterality Date  . Wisdom tooth extraction    . Cholecystectomy    . Temporomandibular joint surgery    . Abdominal hysterectomy  2006    Partial, one ovary left   . Cardiac catheterization  2004    [place], [place] per pt.  . Left heart catheterization with coronary angiogram N/A 01/09/2014    Procedure: LEFT HEART CATHETERIZATION WITH CORONARY ANGIOGRAM;  Surgeon: Marykay Lex, MD;  Location: [facility] CATH LAB;  Service: Cardiovascular;  Laterality: N/A;   Family History  Problem Relation Age of Onset  . Heart disease Father   . Diabetes Maternal Grandmother   . Pancreatitis Mother    History  Substance Use Topics  . Smoking status: Former Smoker -- 1.00 packs/day for .5 years    Types: Cigarettes    Quit date: 05/05/1969  . Smokeless tobacco: Never Used  . Alcohol Use: No    OB History    Gravida Para Term Preterm AB TAB SAB Ectopic Multiple Living   0 0 0 0 0 0 4      Obstetric Comments   Delivered 4 large babies vaginally     Review of Systems  Constitutional: Positive for fatigue. Negative for fever and diaphoresis.  HENT: Negative for congestion.   Respiratory: Negative for cough, chest tightness and shortness of breath.   Cardiovascular: Negative for chest pain.  Gastrointestinal: Negative for nausea, vomiting, abdominal pain and change in bowel habit.  Musculoskeletal: Positive for myalgias. Negative for back pain, arthralgias and gait problem.  Skin: Negative for rash.  Neurological: Positive for weakness and headaches. Negative for facial asymmetry, speech difficulty, light-headedness and numbness.  Psychiatric/Behavioral: Negative for confusion.  All other systems reviewed and are negative.     Allergies  Codeine and Adhesive  Home Medications   Prior to Admission medications   Medication Sig Start Date End Date Taking? Authorizing Provider  albuterol (PROVENTIL HFA;VENTOLIN HFA) 108 (90 BASE) MCG/ACT inhaler Inhale 1-2 puffs into the lungs every 6 (six) hours as needed for wheezing. 07/29/13   Reuben Likes, MD  aspirin EC 81 MG tablet Take 81 mg by mouth daily.    Historical Provider, MD  butalbital-acetaminophen-caffeine (FIORICET, ESGIC) 50-325-40 MG per tablet Take 1 tablet by mouth 2 (two) times daily as needed for headache.     Historical Provider, MD  cholecalciferol (VITAMIN D) 1000 UNITS tablet Take  1,000 Units by mouth daily.    Historical Provider, MD  cyclobenzaprine (FLEXERIL) 10 MG tablet Take 1 tablet (10 mg total) by mouth 3 (three) times daily as needed for muscle spasms. 01/06/15   Allie BossierMyra C Dove, MD  estradiol (ESTRACE VAGINAL) 0.1 MG/GM vaginal cream 1 applicator 2 times per week 12/02/14   Allie BossierMyra C Dove, MD  glipiZIDE (GLUCOTROL) 5 MG tablet Take 10 mg by mouth daily.     Historical Provider, MD  ibuprofen  (ADVIL,MOTRIN) 200 MG tablet Take 200 mg by mouth every 6 (six) hours as needed for headache or mild pain.    Historical Provider, MD  lansoprazole (PREVACID) 30 MG capsule Take 1 capsule (30 mg total) by mouth daily before breakfast. 01/08/14   Clydia LlanoMutaz Elmahi, MD  lisinopril-hydrochlorothiazide (PRINZIDE,ZESTORETIC) 10-12.5 MG per tablet Take 1 tablet by mouth daily.    Historical Provider, MD   BP 187/115 mmHg  Pulse 80  Temp(Src) 98 F (36.7 C)  Resp 18  Wt 201 lb 2 oz (91.23 kg)  SpO2 98% Physical Exam  Constitutional: She is oriented to person, place, and time. She appears well-developed and well-nourished. No distress.  HENT:  Head: Normocephalic and atraumatic.  Nose: Nose normal.  Mouth/Throat: Oropharynx is clear and moist. No oropharyngeal exudate.  Eyes: EOM are normal. Pupils are equal, round, and reactive to light.  Neck: Normal range of motion. Neck supple.  Full ROM, no midline tenderness  Cardiovascular: Normal rate, regular rhythm, normal heart sounds and intact distal pulses.   No murmur heard. Pulmonary/Chest: Effort normal and breath sounds normal. No respiratory distress. She has no wheezes. She exhibits no tenderness.  Abdominal: Soft. There is no tenderness. There is no rebound and no guarding.  Musculoskeletal: Normal range of motion. She exhibits no tenderness.  No tenderness to palpation of lower extremities over bones or muscle groups  Lymphadenopathy:    She has no cervical adenopathy.  Neurological: She is alert and oriented to person, place, and time. No cranial nerve deficit. Coordination normal.  Bilateral lower extremities with full strength and sensation.  No paresthesias to light touch.  Moving all extremities normally  Alert and oriented x 3  Skin: Skin is warm and dry. She is not diaphoretic.  Psychiatric: She has a normal mood and affect. Her behavior is normal. Judgment and thought content normal.  Nursing note and vitals reviewed.   ED Course   Procedures (including critical care time) Labs Review Labs Reviewed  COMPREHENSIVE METABOLIC PANEL - Abnormal; Notable for the following:    GFR calc non Af Amer 63 (*)    GFR calc Af Amer 73 (*)    All other components within normal limits  URINALYSIS, ROUTINE W REFLEX MICROSCOPIC - Abnormal; Notable for the following:    Leukocytes, UA TRACE (*)    All other components within normal limits  I-STAT CHEM 8, ED - Abnormal; Notable for the following:    Hemoglobin 16.3 (*)    HCT 48.0 (*)    All other components within normal limits  CBG MONITORING, ED - Abnormal; Notable for the following:    Glucose-Capillary 109 (*)    All other components within normal limits  PROTIME-INR  APTT  CBC  DIFFERENTIAL  URINE MICROSCOPIC-ADD ON  I-STAT TROPOININ, ED    Imaging Review No results found.   EKG Interpretation None      ED ECG REPORT   Date: 03/08/2015  Rate: 79  Rhythm: normal sinus rhythm  QRS Axis: normal  Intervals: normal (QTc 499)  ST/T Wave abnormalities: nonspecific T wave changes  Conduction Disutrbances:none  Narrative Interpretation: NSR, unchanged nonspecific T wave inversions.   Old EKG Reviewed: unchanged  I have personally reviewed the EKG tracing and agree with the computerized printout as noted.   MDM   Final diagnoses:  Bilateral leg weakness  Chronic fatigue  Acute nonintractable headache, unspecified headache type   Pt is a 69 yo F with hx of HTN, DM, who presents with bilateral lower extremity weakness and headache.  Reports that she has felt generalized fatigue for the past few days, unchanged today. Associated "chills" in her bilateral thighs.  She had a mild fall 4 days ago where she tripped when walking through a door and landed against the doorway wall, but no significant injury occurred.  She developed a headache today that is somewhat different than usual.  Her typical headaches are frontal and associated with photophobia and nausea.   Today's headache is posterior and radiates up to bilateral temporals.  Occasional sharp shooting pains in bilateral frontal area.  No numbness, confusion, facial droop, speech changes, or visual changes.   Daughter became worried that she was having a stroke due to headache and lower extremity weakness, so encouraged the patient to present to the ED.  Exam benign.  Nonfocal neuro exam.  Full strength and sensation throughout.  Will work up for fatigue and headache.  Doubt acute stroke based on bilateral lower extremity sensation of weakness.  Vitals are stable.   Will get CT head.  Labs sent by ED triage, including troponin and urine.   Given motrin for headache.   CT head benign. Trop negative.  EKG with no ischemic changes.  Electrolytes unremarkable.  UA negative for infection.   No clear source of patients bilateral lower extremity fatigue. Doubt myositis or infection based on history/exam.  Full strength on testing.  No evidence of acute injury.  Doubt acute emergent source.   Patient was given reassurance and discussed ED return precautions.  Discharged in stable condition.   Patient was seen with ED Attending, Dr. Ellamae Sia, MD    Lenell Antu, MD 03/08/15 1610  Raeford Razor, MD 03/09/15 [id number]

## 2015-04-22 ENCOUNTER — Emergency Department (HOSPITAL_COMMUNITY)
Admission: EM | Admit: 2015-04-22 | Discharge: 2015-04-22 | Disposition: A | Discharge status: Home / Self Care | Payer: Medicare Other | Source: Home / Self Care | Attending: Family Medicine | Admitting: Family Medicine

## 2015-04-22 ENCOUNTER — Encounter (HOSPITAL_COMMUNITY): Payer: Self-pay | Admitting: Emergency Medicine

## 2015-04-22 DIAGNOSIS — S46819A Strain of other muscles, fascia and tendons at shoulder and upper arm level, unspecified arm, initial encounter: Secondary | ICD-10-CM

## 2015-04-22 MED ORDER — LANSOPRAZOLE 30 MG PO CPDR: 30.0000 mg | DELAYED_RELEASE_CAPSULE | Freq: Every day | ORAL | Status: DC

## 2015-04-22 MED ORDER — DICLOFENAC SODIUM 75 MG PO TBEC: 75.0000 mg | DELAYED_RELEASE_TABLET | Freq: Two times a day (BID) | ORAL | Status: DC

## 2015-04-22 MED ORDER — METHOCARBAMOL 500 MG PO TABS: 500.0000 mg | ORAL_TABLET | Freq: Four times a day (QID) | ORAL | Status: DC | PRN

## 2015-04-22 NOTE — ED Provider Notes (Addendum)
CSN: [id number]     Arrival date & time 04/22/15  1203 History   First MD Initiated Contact with Patient 04/22/15 1255     Chief Complaint  Patient presents with  . Neck Pain  . Arm Pain   (Consider location/radiation/quality/duration/timing/severity/associated sxs/prior Treatment) HPI  4 days ago developed upper back pain. Extends from shoulder to shoulder. Pt moved homes 5 days ago and lifted much more than normal. Tramadol w/o benefit. Pain is constant and worse with certain movements. Achy in nature. Some radiation of the posterior neck. Has not taken anything for her symptoms.  Reflux. Takes intermittent Prevacid. No recent flares. But has had significant problems with this the past.   Past Medical History  Diagnosis Date  . Hypertension   . Diabetes mellitus   . Obesity (BMI 35.0-39.9 without comorbidity)   . GERD (gastroesophageal reflux disease)   . Chest pain 2009    MV with no scar or ischemia, EF 65%   Past Surgical History  Procedure Laterality Date  . Wisdom tooth extraction    . Cholecystectomy    . Temporomandibular joint surgery    . Abdominal hysterectomy  2006    Partial, one ovary left   . Cardiac catheterization  2004    [place] per pt.  . Left heart catheterization with coronary angiogram N/A 01/09/2014    Procedure: LEFT HEART CATHETERIZATION WITH CORONARY ANGIOGRAM;  Surgeon: Marykay Lex, MD;  Location: [facility] CATH LAB;  Service: Cardiovascular;  Laterality: N/A;   Family History  Problem Relation Age of Onset  . Heart disease Father   . Diabetes Maternal Grandmother   . Pancreatitis Mother    History  Substance Use Topics  . Smoking status: Former Smoker -- 1.00 packs/day for .5 years    Types: Cigarettes    Quit date: 05/05/1969  . Smokeless tobacco: Never Used  . Alcohol Use: No   OB History    Gravida Para Term Preterm AB TAB SAB Ectopic Multiple Living   0 0 0 0 0 0 4      Obstetric Comments   Delivered 4 large  babies vaginally     Review of Systems Per HPI with all other pertinent systems negative.   Allergies  Codeine and Adhesive  Home Medications   Prior to Admission medications   Medication Sig Start Date End Date Taking? Authorizing Provider  albuterol (PROVENTIL HFA;VENTOLIN HFA) 108 (90 BASE) MCG/ACT inhaler Inhale 1-2 puffs into the lungs every 6 (six) hours as needed for wheezing. 07/29/13   Reuben Likes, MD  aspirin 81 MG chewable tablet Chew 81 mg by mouth daily.    Historical Provider, MD  benzonatate (TESSALON) 200 MG capsule Take 200 mg by mouth 3 (three) times daily as needed for cough.    Historical Provider, MD  butalbital-acetaminophen-caffeine (FIORICET, ESGIC) 50-325-40 MG per tablet Take 1 tablet by mouth every 6 (six) hours as needed for headache or migraine.     Historical Provider, MD  cholecalciferol (VITAMIN D) 1000 UNITS tablet Take 1,000 Units by mouth daily.    Historical Provider, MD  diclofenac (VOLTAREN) 75 MG EC tablet Take 1 tablet (75 mg total) by mouth 2 (two) times daily. 04/22/15   Ozella Rocks, MD  estradiol (ESTRACE VAGINAL) 0.1 MG/GM vaginal cream 1 applicator 2 times per week Patient taking differently: Place 1 Applicatorful vaginally 2 (two) times a week. Tuesday and Thursday 12/02/14   Allie Bossier, MD  glipiZIDE (  GLUCOTROL) 5 MG tablet Take 5 mg by mouth 2 (two) times daily before a meal.     Historical Provider, MD  ibuprofen (ADVIL,MOTRIN) 200 MG tablet Take 200 mg by mouth every 6 (six) hours as needed for headache or mild pain.    Historical Provider, MD  lansoprazole (PREVACID) 30 MG capsule Take 1 capsule (30 mg total) by mouth daily before breakfast. 04/22/15   Ozella Rocksavid J Kortni Hasten, MD  lisinopril-hydrochlorothiazide (PRINZIDE,ZESTORETIC) 10-12.5 MG per tablet Take 1 tablet by mouth daily.    Historical Provider, MD  methocarbamol (ROBAXIN) 500 MG tablet Take 1-2 tablets (500-1,000 mg total) by mouth every 6 (six) hours as needed for muscle spasms.  04/22/15   Ozella Rocksavid J Dakarai Mcglocklin, MD   BP 182/111 mmHg  Pulse 84  Temp(Src) 98 F (36.7 C) (Oral)  Resp 14  SpO2 100% Physical Exam Physical Exam  Constitutional: oriented to person, place, and time. appears well-developed and well-nourished. No distress.  HENT:  Head: Normocephalic and atraumatic.  Eyes: EOMI. PERRL.  Neck: Normal range of motion.  Cardiovascular: RRR, no m/r/g, 2+ distal pulses,  Pulmonary/Chest: Effort normal and breath sounds normal. No respiratory distress.  Abdominal: Soft. Bowel sounds are normal. NonTTP, no distension.  Musculoskeletal: Tight upper trapezius muscle with some intermittent tenderness to palpation. Full range of motion.  Neurological: alert and oriented to person, place, and time.  Skin: Skin is warm. No rash noted. non diaphoretic.  Psychiatric: normal mood and affect. behavior is normal. Judgment and thought content normal.   ED Course  Procedures (including critical care time) Labs Review Labs Reviewed - No data to display  Imaging Review No results found.   MDM   1. Trapezius strain, unspecified laterality, initial encounter    Exercises, Robaxin, Voltaren  For reflux patient to us restart her Prevacid and take daily. Add Pepcid or Tums if needed.  Hypertension noted. Patient in pain and stressed. F/u PCP or go to ED if worsens or develops secondary symptoms.   Ozella Rocksavid J Yvonna Brun, MD 04/22/15 1322  Ozella Rocksavid J Flor Whitacre, MD 04/22/15 1323  Ozella Rocksavid J Enrigue Hashimi, MD 04/22/15 1325

## 2015-04-22 NOTE — Discharge Instructions (Signed)
Experiencing musculoskeletal pain likely from stress and strain trapezius muscle. Please remember to apply heat, massage, and stretch and exercise the area regularly. Please use the Voltaren for additional pain relief. Please use the Robaxin for muscle spasms.

## 2015-04-22 NOTE — ED Notes (Signed)
Pain in right arm/shoulder across shoulder into shoulder blade, right and around to left side of neck.  No known injury

## 2015-07-07 DIAGNOSIS — M51369 Other intervertebral disc degeneration, lumbar region without mention of lumbar back pain or lower extremity pain: Secondary | ICD-10-CM | POA: Insufficient documentation

## 2015-07-07 DIAGNOSIS — M5136 Other intervertebral disc degeneration, lumbar region: Secondary | ICD-10-CM | POA: Insufficient documentation

## 2015-11-25 ENCOUNTER — Emergency Department (HOSPITAL_COMMUNITY): Payer: Medicare Other

## 2015-11-25 ENCOUNTER — Encounter (HOSPITAL_COMMUNITY): Payer: Self-pay | Admitting: *Deleted

## 2015-11-25 ENCOUNTER — Emergency Department (HOSPITAL_COMMUNITY)
Admission: EM | Admit: 2015-11-25 | Discharge: 2015-11-25 | Disposition: A | Discharge status: Home / Self Care | Payer: Medicare Other | Attending: Emergency Medicine | Admitting: Emergency Medicine

## 2015-11-25 DIAGNOSIS — Z791 Long term (current) use of non-steroidal anti-inflammatories (NSAID): Secondary | ICD-10-CM | POA: Diagnosis not present

## 2015-11-25 DIAGNOSIS — I10 Essential (primary) hypertension: Secondary | ICD-10-CM | POA: Insufficient documentation

## 2015-11-25 DIAGNOSIS — Z9049 Acquired absence of other specified parts of digestive tract: Secondary | ICD-10-CM | POA: Diagnosis not present

## 2015-11-25 DIAGNOSIS — Z87891 Personal history of nicotine dependence: Secondary | ICD-10-CM | POA: Diagnosis not present

## 2015-11-25 DIAGNOSIS — Z90710 Acquired absence of both cervix and uterus: Secondary | ICD-10-CM | POA: Insufficient documentation

## 2015-11-25 DIAGNOSIS — R079 Chest pain, unspecified: Secondary | ICD-10-CM | POA: Diagnosis present

## 2015-11-25 DIAGNOSIS — K219 Gastro-esophageal reflux disease without esophagitis: Secondary | ICD-10-CM | POA: Insufficient documentation

## 2015-11-25 DIAGNOSIS — M549 Dorsalgia, unspecified: Secondary | ICD-10-CM | POA: Insufficient documentation

## 2015-11-25 DIAGNOSIS — R109 Unspecified abdominal pain: Secondary | ICD-10-CM | POA: Insufficient documentation

## 2015-11-25 DIAGNOSIS — R1111 Vomiting without nausea: Secondary | ICD-10-CM | POA: Diagnosis not present

## 2015-11-25 DIAGNOSIS — Z79899 Other long term (current) drug therapy: Secondary | ICD-10-CM | POA: Diagnosis not present

## 2015-11-25 DIAGNOSIS — J189 Pneumonia, unspecified organism: Secondary | ICD-10-CM | POA: Diagnosis not present

## 2015-11-25 DIAGNOSIS — E119 Type 2 diabetes mellitus without complications: Secondary | ICD-10-CM | POA: Insufficient documentation

## 2015-11-25 DIAGNOSIS — Z7982 Long term (current) use of aspirin: Secondary | ICD-10-CM | POA: Insufficient documentation

## 2015-11-25 DIAGNOSIS — J181 Lobar pneumonia, unspecified organism: Secondary | ICD-10-CM

## 2015-11-25 DIAGNOSIS — Z9889 Other specified postprocedural states: Secondary | ICD-10-CM | POA: Diagnosis not present

## 2015-11-25 DIAGNOSIS — E669 Obesity, unspecified: Secondary | ICD-10-CM | POA: Diagnosis not present

## 2015-11-25 LAB — URINALYSIS, ROUTINE W REFLEX MICROSCOPIC
Bilirubin Urine: NEGATIVE
Glucose, UA: NEGATIVE mg/dL
Hgb urine dipstick: NEGATIVE
Ketones, ur: NEGATIVE mg/dL
LEUKOCYTES UA: NEGATIVE
NITRITE: NEGATIVE
PH: 7 (ref 5.0–8.0)
Protein, ur: NEGATIVE mg/dL
SPECIFIC GRAVITY, URINE: 1.007 (ref 1.005–1.030)

## 2015-11-25 LAB — BASIC METABOLIC PANEL
Anion gap: 9 (ref 5–15)
BUN: 15 mg/dL (ref 6–20)
CALCIUM: 9.3 mg/dL (ref 8.9–10.3)
CO2: 26 mmol/L (ref 22–32)
CREATININE: 0.74 mg/dL (ref 0.44–1.00)
Chloride: 105 mmol/L (ref 101–111)
GFR calc Af Amer: >60 mL/min (ref >60)
GFR calc non Af Amer: >60 mL/min (ref >60)
Glucose, Bld: 144 mg/dL — ABNORMAL HIGH (ref 65–99)
Potassium: 3.4 mmol/L — ABNORMAL LOW (ref 3.5–5.1)
Sodium: 140 mmol/L (ref 135–145)

## 2015-11-25 LAB — CBC
HCT: 42.2 % (ref 36.0–46.0)
Hemoglobin: 13.3 g/dL (ref 12.0–15.0)
MCH: 29 pg (ref 26.0–34.0)
MCHC: 31.5 g/dL (ref 30.0–36.0)
MCV: 92.1 fL (ref 78.0–100.0)
PLATELETS: 263 10*3/uL (ref 150–400)
RBC: 4.58 MIL/uL (ref 3.87–5.11)
RDW: 14.7 % (ref 11.5–15.5)
WBC: 7.2 10*3/uL (ref 4.0–10.5)

## 2015-11-25 LAB — LIPASE, BLOOD: Lipase: 33 U/L (ref 11–51)

## 2015-11-25 LAB — D-DIMER, QUANTITATIVE (NOT AT ARMC): D DIMER QUANT: 0.56 ug{FEU}/mL — AB (ref 0.00–0.50)

## 2015-11-25 LAB — I-STAT TROPONIN, ED
TROPONIN I, POC: 0 ng/mL (ref 0.00–0.08)
TROPONIN I, POC: 0 ng/mL (ref 0.00–0.08)

## 2015-11-25 MED ORDER — SODIUM CHLORIDE 0.9 % IV BOLUS (SEPSIS)
1000.0000 mL | Freq: Once | INTRAVENOUS | Status: AC
  Administered 2015-11-25: 1000 mL via INTRAVENOUS

## 2015-11-25 MED ORDER — KETOROLAC TROMETHAMINE 30 MG/ML IJ SOLN
15.0000 mg | Freq: Once | INTRAMUSCULAR | Status: AC
  Administered 2015-11-25: 15 mg via INTRAVENOUS
  Filled 2015-11-25: qty 1

## 2015-11-25 MED ORDER — ONDANSETRON HCL 4 MG PO TABS
4.0000 mg | ORAL_TABLET | Freq: Once | ORAL | Status: AC
  Administered 2015-11-25: 4 mg via ORAL
  Filled 2015-11-25: qty 1

## 2015-11-25 MED ORDER — MORPHINE SULFATE (PF) 4 MG/ML IV SOLN
4.0000 mg | Freq: Once | INTRAVENOUS | Status: AC
  Administered 2015-11-25: 4 mg via INTRAVENOUS
  Filled 2015-11-25: qty 1

## 2015-11-25 MED ORDER — HYDROCODONE-ACETAMINOPHEN 5-325 MG PO TABS: 1.0000 | ORAL_TABLET | Freq: Four times a day (QID) | ORAL | Status: DC | PRN

## 2015-11-25 MED ORDER — IOHEXOL 350 MG/ML SOLN
100.0000 mL | Freq: Once | INTRAVENOUS | Status: AC | PRN
  Administered 2015-11-25: 70 mL via INTRAVENOUS

## 2015-11-25 MED ORDER — DOXYCYCLINE HYCLATE 100 MG PO CAPS: 100.0000 mg | ORAL_CAPSULE | Freq: Two times a day (BID) | ORAL | Status: DC

## 2015-11-25 MED ORDER — DOCUSATE SODIUM 100 MG PO CAPS: 100.0000 mg | ORAL_CAPSULE | Freq: Two times a day (BID) | ORAL | Status: DC

## 2015-11-25 NOTE — ED Notes (Signed)
Pt transported to CT ?

## 2015-11-25 NOTE — ED Provider Notes (Signed)
Medical screening examination/treatment/procedure(s) were conducted as a shared visit with non-physician practitioner(s) and myself.  I personally evaluated the patient during the encounter.   EKG Interpretation   Date/Time:  Thursday November 25 2015 06:02:25 EST Ventricular Rate:  86 PR Interval:  138 QRS Duration: 92 QT Interval:  416 QTC Calculation: 497 R Axis:   -53 Text Interpretation:  Normal sinus rhythm Left anterior fascicular block  Nonspecific T wave abnormality Abnormal ECG No significant change since  last tracing Confirmed by Erroll Lunani, Adeleke Ayokunle [phone]([id number]) on 11/25/2015  6:31:6727 AM      70 year old female with history of hypertension, diabetes, and history of cholecystectomy with abdominal hysterectomy who presents with left upper quadrant abdominal pain. Although her chief complaints states chest pain, she states that her chief complaint is more related to her abdomen. Has had intermittent left-sided abdominal pain over the course of the past 2 weeks, but at 11 PM tonight pain was most severe and associated with nausea and vomiting x 1. States that pain radiates from her left flank into her left lower quadrant and into her chest.  No diarrhea, constipation, abdominal distention, fevers or chills, dysuria or urinary frequency. States mild sob with this with worsening pain with deep inspiration. No edema or pain in her legs and no prior history of PE/DVT or other risk factors for PE/DVT.  Vital signs are stable on presentation, and she has an overall soft and non-peritoneal abdomen. Pain reproducible with palpation of the left lower chest wall, upper abdomen, and left flank. CT abd/pelvs unremarkable and CT chest without PE. Has thoracic aneurysm, for which was discussed wth patient and she will follow-up with PCP about follow-up imaging in the future. Also discussed need for mammogram with patient regarding breast lesion. Possible RLL pna on CT, and patient does endorse history of  productive cough. Given course of doxycycline for treatment. Appropriate for outpatient management. Serial trop negative and EKG non-ischemic. With clean coronaries on LHC on 12/2013; thus, unlikely to be ACS related pain. Felt that she is adequately ruled out for serious causes of symptoms. Stable for discharge home.  Strict return and follow-up instructions reviewed. She expressed understanding of all discharge instructions and felt comfortable with the plan of care.   Lavera Guiseana Duo Sherolyn Trettin, MD 11/25/15 [phone]

## 2015-11-25 NOTE — ED Notes (Signed)
PA at bedside.

## 2015-11-25 NOTE — ED Notes (Signed)
Patient transported to X-ray 

## 2015-11-25 NOTE — ED Notes (Signed)
Pty to Ed c/o constant sharp L sided chest/breast pain with shortness of breath. Pain radiates into pt's back, worsening with inspiration. Hx of pneumonia.

## 2015-11-25 NOTE — Discharge Instructions (Signed)
Return for worsening symptoms, including worsening pain, difficulty breathing, or any other symptoms concerning to you. Your Ct also showed Pneumonia. Take antibiotics as prescribed  Chest Wall Pain Chest wall pain is pain in or around the bones and muscles of your chest. Sometimes, an injury causes this pain. Sometimes, the cause may not be known. This pain may take several weeks or longer to get better. HOME CARE INSTRUCTIONS  Pay attention to any changes in your symptoms. Take these actions to help with your pain:   Rest as told by your health care provider.   Avoid activities that cause pain. These include any activities that use your chest muscles or your abdominal and side muscles to lift heavy items.   If directed, apply ice to the painful area:  Put ice in a plastic bag.  Place a towel between your skin and the bag.  Leave the ice on for 20 minutes, 2-3 times per day.  Take over-the-counter and prescription medicines only as told by your health care provider.  Do not use tobacco products, including cigarettes, chewing tobacco, and e-cigarettes. If you need help quitting, ask your health care provider.  Keep all follow-up visits as told by your health care provider. This is important. SEEK MEDICAL CARE IF:  You have a fever.  Your chest pain becomes worse.  You have new symptoms. SEEK IMMEDIATE MEDICAL CARE IF:  You have nausea or vomiting.  You feel sweaty or light-headed.  You have a cough with phlegm (sputum) or you cough up blood.  You develop shortness of breath.   This information is not intended to replace advice given to you by your health care provider. Make sure you discuss any questions you have with your health care provider.   Document Released: 11/06/2005 Document Revised: 07/28/2015 Document Reviewed: 02/01/2015 Elsevier Interactive Patient Education [employer]2016 [employer]vier Inc.

## 2015-11-25 NOTE — ED Provider Notes (Signed)
CSN: [id number]     Arrival date & time 11/25/15  0601 History   First MD Initiated Contact with Patient 11/25/15 [phone]     Chief Complaint  Patient presents with  . Chest Pain   (Consider location/radiation/quality/duration/timing/severity/associated sxs/prior Treatment) HPI 70 y.o. female with a hx of HTN, DM, cholecystectomy and abdominal hysterectomy, presents to the Emergency Department today complaining of L sided chest/abdominal pain for the past week. States that she remembered twisting in bed last week and feeling a slight pain on her Left side. She took some tylenol at the time and carried on with her daily activities. As the week progressed, the pain began to increase until it was sudden 10/10 pain today. She has some N/V with no hematemesis. She has some shortness of breath due to the pain on her left side. No trauma was noted to the left side. The pain radiates towards her back. No other symptoms noted.   Past Medical History  Diagnosis Date  . Hypertension   . Diabetes mellitus   . Obesity (BMI 35.0-39.9 without comorbidity) (HCC)   . GERD (gastroesophageal reflux disease)   . Chest pain 2009    MV with no scar or ischemia, EF 65%   Past Surgical History  Procedure Laterality Date  . Wisdom tooth extraction    . Cholecystectomy    . Temporomandibular joint surgery    . Abdominal hysterectomy  2006    Partial, one ovary left   . Cardiac catheterization  2004    [place], [place] per pt.  . Left heart catheterization with coronary angiogram N/A 01/09/2014    Procedure: LEFT HEART CATHETERIZATION WITH CORONARY ANGIOGRAM;  Surgeon: Marykay Lex, MD;  Location: [facility] CATH LAB;  Service: Cardiovascular;  Laterality: N/A;   Family History  Problem Relation Age of Onset  . Heart disease Father   . Diabetes Maternal Grandmother   . Pancreatitis Mother    Social History  Substance Use Topics  . Smoking status: Former Smoker -- 1.00 packs/day for .5 years    Types:  Cigarettes    Quit date: 05/05/1969  . Smokeless tobacco: Never Used  . Alcohol Use: No   OB History    Gravida Para Term Preterm AB TAB SAB Ectopic Multiple Living   4 4 4  0 0 0 0 0 0 4      Obstetric Comments   Delivered 4 large babies vaginally     Review of Systems  Constitutional: Negative for fever, chills, diaphoresis and fatigue.  HENT: Negative for congestion, sinus pressure, sore throat and tinnitus.   Eyes: Negative for visual disturbance.  Respiratory: Positive for shortness of breath. Negative for cough.   Cardiovascular: Positive for chest pain.  Gastrointestinal: Positive for nausea and vomiting. Negative for abdominal pain, diarrhea and constipation.  Endocrine: Negative for cold intolerance and heat intolerance.  Genitourinary: Negative for dysuria and hematuria.  Musculoskeletal: Positive for back pain.  Skin: Negative for color change.  Neurological: Negative for dizziness, syncope, weakness, light-headedness, numbness and headaches.   Allergies  Codeine and Adhesive  Home Medications   Prior to Admission medications   Medication Sig Start Date End Date Taking? Authorizing Provider  albuterol (PROVENTIL HFA;VENTOLIN HFA) 108 (90 BASE) MCG/ACT inhaler Inhale 1-2 puffs into the lungs every 6 (six) hours as needed for wheezing. 07/29/13   Reuben Likes, MD  aspirin 81 MG chewable tablet Chew 81 mg by mouth daily.    Historical Provider, MD  benzonatate (TESSALON) 200  MG capsule Take 200 mg by mouth 3 (three) times daily as needed for cough.    Historical Provider, MD  butalbital-acetaminophen-caffeine (FIORICET, ESGIC) 50-325-40 MG per tablet Take 1 tablet by mouth every 6 (six) hours as needed for headache or migraine.     Historical Provider, MD  cholecalciferol (VITAMIN D) 1000 UNITS tablet Take 1,000 Units by mouth daily.    Historical Provider, MD  diclofenac (VOLTAREN) 75 MG EC tablet Take 1 tablet (75 mg total) by mouth 2 (two) times daily. 04/22/15   Ozella Rocks, MD  estradiol (ESTRACE VAGINAL) 0.1 MG/GM vaginal cream 1 applicator 2 times per week Patient taking differently: Place 1 Applicatorful vaginally 2 (two) times a week. Tuesday and Thursday 12/02/14   Allie Bossier, MD  glipiZIDE (GLUCOTROL) 5 MG tablet Take 5 mg by mouth 2 (two) times daily before a meal.     Historical Provider, MD  ibuprofen (ADVIL,MOTRIN) 200 MG tablet Take 200 mg by mouth every 6 (six) hours as needed for headache or mild pain.    Historical Provider, MD  lansoprazole (PREVACID) 30 MG capsule Take 1 capsule (30 mg total) by mouth daily before breakfast. 04/22/15   Ozella Rocks, MD  lisinopril-hydrochlorothiazide (PRINZIDE,ZESTORETIC) 10-12.5 MG per tablet Take 1 tablet by mouth daily.    Historical Provider, MD  methocarbamol (ROBAXIN) 500 MG tablet Take 1-2 tablets (500-1,000 mg total) by mouth every 6 (six) hours as needed for muscle spasms. 04/22/15   Ozella Rocks, MD   BP 157/82 mmHg  Pulse 84  Temp(Src) 98.1 F (36.7 C) (Oral)  Resp 18  SpO2 99%   Physical Exam  Constitutional: She is oriented to person, place, and time. Vital signs are normal. She appears well-developed and well-nourished.  HENT:  Head: Normocephalic and atraumatic.  Right Ear: Hearing, tympanic membrane, external ear and ear canal normal.  Left Ear: Hearing, tympanic membrane, external ear and ear canal normal.  Nose: Nose normal.  Mouth/Throat: Uvula is midline, oropharynx is clear and moist and mucous membranes are normal.  Eyes: Conjunctivae, EOM and lids are normal. Pupils are equal, round, and reactive to light.  Neck: Trachea normal and normal range of motion. Neck supple.  Cardiovascular: Normal rate, regular rhythm, S1 normal, S2 normal, normal heart sounds, intact distal pulses and normal pulses.   Pulmonary/Chest: Effort normal and breath sounds normal. She exhibits tenderness (Left lateral side ribs 10-11). She exhibits no bony tenderness, no deformity and no swelling.   Abdominal: Soft. Normal appearance and bowel sounds are normal. There is tenderness in the left upper quadrant. There is CVA tenderness. There is no rebound and negative Murphy's sign.  Lymphadenopathy:    She has no cervical adenopathy.  Neurological: She is alert and oriented to person, place, and time. She has normal strength. No cranial nerve deficit or sensory deficit.  Skin: Skin is warm and dry.  Psychiatric: She has a normal mood and affect. Her speech is normal and behavior is normal. Thought content normal.  Vitals reviewed.  ED Course  Procedures (including critical care time) Labs Review Labs Reviewed  BASIC METABOLIC PANEL - Abnormal; Notable for the following:    Potassium 3.4 (*)    Glucose, Bld 144 (*)    All other components within normal limits  CBC  LIPASE, BLOOD  URINALYSIS, ROUTINE W REFLEX MICROSCOPIC (NOT AT [facility])  Rosezena Sensor, ED   Imaging Review Dg Abd Acute W/chest  11/25/2015  CLINICAL DATA:  Acute onset of  left lower chest pain and left upper quadrant abdominal pain. Vomiting and cough. Initial encounter. EXAM: DG ABDOMEN ACUTE W/ 1V CHEST COMPARISON:  Chest radiograph performed 01/06/2014, MRI of the abdomen performed 04/28/2009 FINDINGS: The lungs are well-aerated. Minimal bibasilar atelectasis is noted. There is no evidence of pleural effusion or pneumothorax. The cardiomediastinal silhouette is borderline normal in size. The visualized bowel gas pattern is unremarkable. Scattered stool and air are seen within the colon; there is no evidence of small bowel dilatation to suggest obstruction. No free intra-abdominal air is identified on the provided upright view. Clips are noted within the right upper quadrant, reflecting prior cholecystectomy. No acute osseous abnormalities are seen; the sacroiliac joints are unremarkable in appearance. IMPRESSION: 1. Unremarkable bowel gas pattern; no free intra-abdominal air seen. Small to moderate amount of stool noted in  the colon. 2. Minimal bibasilar atelectasis noted.  Lungs otherwise clear. Electronically Signed   By: Roanna RaiderJeffery  Chang M.D.   On: 11/25/2015 06:54   Ct Renal Stone Study  11/25/2015  CLINICAL DATA:  Pt having left flank pain since yesterday. Pt doesn't have any other complaints. EXAM: CT ABDOMEN AND PELVIS WITHOUT CONTRAST TECHNIQUE: Multidetector CT imaging of the abdomen and pelvis was performed following the standard protocol without IV contrast. COMPARISON:  None. FINDINGS: Lower chest: Minimal scarring at both lung bases. Heart size is normal. No imaged pericardial effusion or significant coronary artery calcifications. Upper abdomen: No focal abnormality identified within the liver, spleen, pancreas, or adrenal glands. Kidneys are symmetric in appearance. No hydronephrosis or intrarenal calculi. Gallbladder is surgically absent. Gastrointestinal tract: Stomach and small bowel loops are normal in appearance. The appendix is well seen and has a normal appearance. There is moderate colonic diverticulosis without associated inflammation. Pelvis: Status post hysterectomy. Retroperitoneum: There is moderate atherosclerosis of the abdominal aorta. No aneurysm. No retroperitoneal or mesenteric adenopathy. Abdominal wall: Unremarkable. Osseous structures: Degenerative changes are seen in the lower thoracic and lumbar spine. No suspicious lytic or blastic lesions are identified. IMPRESSION: 1. Colonic diverticulosis.  No acute diverticulitis. 2. No renal or ureteral stones. 3. Status post cholecystectomy aunts hysterectomy. 4. No retroperitoneal adenopathy. Electronically Signed   By: Norva PavlovElizabeth  Brown M.D.   On: 11/25/2015 09:21   I have personally reviewed and evaluated these images and lab results as part of my medical decision-making.   EKG Interpretation   Date/Time:  Thursday November 25 2015 06:02:25 EST Ventricular Rate:  86 PR Interval:  138 QRS Duration: 92 QT Interval:  416 QTC Calculation: 497 R  Axis:   -53 Text Interpretation:  Normal sinus rhythm Left anterior fascicular block  Nonspecific T wave abnormality Abnormal ECG No significant change since  last tracing Confirmed by Erroll Lunani, Adeleke Ayokunle [phone]([id number]) on 11/25/2015  6:31:27 AM      MDM  I have reviewed relevant laboratory values. I have reviewed relevant imaging studies. I personally interpreted the relevant EKG. I have reviewed the relevant previous healthcare records. I have reviewed EMS Documentation. I obtained HPI from historian. Patient discussed with supervising physician  ED Course: CXR CT Renal Study  Assessment: 46yF presents with LUQ pain for the past week. Low suspicion for ACS due to hx, symptoms, and neg trop and EKG unremarkable. Positive CVA tenderness on exam and colicky nature of pain raises suspicion for nephrolithiasis. Will do CT and UA for further evaluation. Given analgesia for pain control and antiemetics.  Pt is afebrile, No WBC. BP WNL. UE and LE pulses equal bilaterally. Pt is not  altered. CN intact. No motor/sensory deficits.   CT- Negative UA- Negative  Disposition/Plan:    Further management provided by Dr. Lavera Guise, MD (see note) Additional sign out given to Marlon Pel, PA-C    Supervising Physician Lavera Guise, MD   Final diagnoses:  None        Audry Pili, PA-C 11/25/15 1645  Lavera Guise, MD 11/26/15 [phone]

## 2015-11-30 DIAGNOSIS — R922 Inconclusive mammogram: Secondary | ICD-10-CM | POA: Insufficient documentation

## 2015-12-01 NOTE — Progress Notes (Signed)
Cardiology Office Note    Date:  12/02/2015   ID:  [name], DOB [birth year], MRN [id number]  PCP:  Verlon Au, MD  Cardiologist:  Dr. Donato Schultz   Electrophysiologist:  n/a  Chief Complaint  Patient presents with  . Hospitalization Follow-up  . Ascending thoracic aortic aneurysm    History of Present Illness:  [name] [name] is a 70 y.o. female with a hx of DM2, HTN,HL, obesity.  Prior nuclear stress test neg for ischemia in 2013.  She was seen by cardiology in the hospital in 2/15 for chest pain.  Nuclear stress test at that time was abnormal suggested anteroapical ischemia.  LHC demonstrated normal coronary arteries.    Patient was recent seen in the emergency room 11/25/15 with chest pain.  CT scan demonstrated possible RLL pneumonia.  Of note there was mild dilatation of the ascending thoracic aorta measuring 4.2 x 4 cm.  The patient tells me that she has had chest pain for years.  She had worsening symptoms in the 2 weeks prior to her visit to the ED.  She notes pleuritic symptoms.  She also notes pain with coughing.  Overall, she is feeling better since starting antibiotics.  She notes chronic DOE.  She feels it has gotten worse over several months.  She is NYHA 2b.  She sleeps on 3 pillows chronically.  She denies significant edema.  She denies syncope.     Past Medical History  Diagnosis Date  . Hypertension   . Diabetes mellitus   . Obesity (BMI 35.0-39.9 without comorbidity) (HCC)   . GERD (gastroesophageal reflux disease)   . Chest pain 2009    MV with no scar or ischemia, EF 65%  . History of cardiac catheterization     a. Myoview 2/15 with anteroapical ischemia, EF 64% (intermediate risk) >> LHC - normal cos, EF 55-60%  . Thoracic ascending aortic aneurysm (HCC)     a. CT 1/17: ascending aorta 4.2 x 4.0 cm    Past Surgical History  Procedure Laterality Date  . Wisdom tooth extraction    . Cholecystectomy    . Temporomandibular joint surgery      . Abdominal hysterectomy  2006    Partial, one ovary left   . Cardiac catheter[place]  2004    [place] per pt.  . Left heart catheterization with coronary angiogram N/A 01/09/2014    Procedure: LEFT HEART CATHETERIZATION WITH CORONARY ANGIOGRAM;  Surgeon: Marykay Lex, MD;  Location: [facility] CATH LAB;  Service: Cardiovascular;  Laterality: N/A;    Current Medications: Previous Medications   ALBUTEROL (PROVENTIL HFA;VENTOLIN HFA) 108 (90 BASE) MCG/ACT INHALER    Inhale 1-2 puffs into the lungs every 6 (six) hours as needed for wheezing.   ASPIRIN 81 MG TABLET    Take 81 mg by mouth daily.   CHOLECALCIFEROL (VITAMIN D) 1000 UNITS TABLET    Take 1,000 Units by mouth daily.   DICLOFENAC (VOLTAREN) 75 MG EC TABLET    Take 1 tablet (75 mg total) by mouth 2 (two) times daily.   DOXYCYCLINE (VIBRAMYCIN) 100 MG CAPSULE    Take 1 capsule (100 mg total) by mouth 2 (two) times daily.   ESTRADIOL (ESTRACE VAGINAL) 0.1 MG/GM VAGINAL CREAM    1 applicator 2 times per week   GLIPIZIDE (GLUCOTROL) 5 MG TABLET    Take 5 mg by mouth 2 (two) times daily before a meal.    HYDROCODONE-ACETAMINOPHEN (NORCO/VICODIN) 5-325 MG TABLET  Take 1 tablet by mouth every 6 (six) hours as needed for moderate pain or severe pain.   IBUPROFEN (ADVIL,MOTRIN) 200 MG TABLET    Take 200 mg by mouth every 6 (six) hours as needed for headache or mild pain.   LANSOPRAZOLE (PREVACID) 30 MG CAPSULE    Take 1 capsule (30 mg total) by mouth daily before breakfast.   LISINOPRIL-HYDROCHLOROTHIAZIDE (PRINZIDE,ZESTORETIC) 10-12.5 MG PER TABLET    Take 1 tablet by mouth daily.   METHOCARBAMOL (ROBAXIN) 500 MG TABLET    Take 1-2 tablets (500-1,000 mg total) by mouth every 6 (six) hours as needed for muscle spasms.   ONE TOUCH ULTRA TEST TEST STRIP    1 each by Other route as needed (BLOOD GLUCOSE MONITORING).      Allergies:   Codeine and Adhesive   Social History   Social History  . Marital Status: Married    Spouse Name:  N/A  . Number of Children: N/A  . Years of Education: N/A   Occupational History  . Caregiver for the elderly    Social History Main Topics  . Smoking status: Former Smoker -- 1.00 packs/day for .5 years    Types: Cigarettes    Quit date: 05/05/1969  . Smokeless tobacco: Never Used  . Alcohol Use: No  . Drug Use: No  . Sexual Activity: No   Other Topics Concern  . None   Social History Narrative   Family History Details: Her mother died at age 71 of pancreatitis. Her father died of mitral valve disease at age 71. She has a brother who has had myocardial infarction and also has had bypass surgery and a sister who    has a pacemaker.           Family History:  The patient's family history includes Diabetes in her maternal grandmother; Heart disease in her father; Pancreatitis in her mother.   ROS:   Please see the history of present illness.    Review of Systems  HENT: Positive for headaches.   Cardiovascular: Positive for chest pain and dyspnea on exertion.  Respiratory: Positive for cough and shortness of breath.   Hematologic/Lymphatic: Bruises/bleeds easily.  Musculoskeletal: Positive for back pain, joint pain and myalgias.  Gastrointestinal: Positive for nausea.  All other systems reviewed and are negative.   PHYSICAL EXAM:   VS:  BP 140/90 mmHg  Pulse 86  Ht 5\' 3"  (1.6 m)  Wt 191 lb (86.637 kg)  BMI 33.84 kg/m2   GEN: Well nourished, well developed, in no acute distress HEENT: normal Neck: no JVD, no masses Cardiac: Normal S1/S2, RRR; no murmurs, rubs, or gallops, no edema   Respiratory:  clear to auscultation bilaterally; no wheezing, rhonchi or rales GI: soft, nontender, nondistended  MS: no deformity or atrophy Skin: warm and dry, no rash Neuro:   no focal deficits  Psych: Alert and oriented x 3, normal affect  Wt Readings from Last 3 Encounters:  12/02/15 191 lb (86.637 kg)  03/07/15 201 lb 2 oz (91.23 kg)  01/06/15 198 lb 8 oz (90.039 kg)       Studies/Labs Reviewed:   EKG:  EKG is no ordered today.  The ekg done in the ED 11/25/15  Demonstrated NSR, HR 86, LAD, NSSTTW changes, QTc 497 ms, no change from prior tracing.   Recent Labs: 03/07/2015: ALT 14 11/25/2015: BUN 15; Creatinine, Ser 0.74; Hemoglobin 13.3; Platelets 263; Potassium 3.4*; Sodium 140   Recent Lipid Panel    Component  Value Date/Time   CHOL 140 01/09/2014 0232   TRIG 92 01/09/2014 0232   HDL 45 01/09/2014 0232   CHOLHDL 3.1 01/09/2014 0232   VLDL 18 01/09/2014 0232   LDLCALC 77 01/09/2014 0232    Additional studies/ records that were reviewed today include:   Chest CTA 11/25/15 IMPRESSION: No demonstrable pulmonary embolus. Patchy bibasilar atelectasis with mild consolidation posterior right base. Prominence of the ascending thoracic aorta measuring 4.2 x 4.0 cm. Recommend annual imaging followup by CTA or MRA. This recommendation follows 2010 ACCF/AHA/AATS/ACR/ASA/SCA/SCAI/SIR/STS/SVM Guidelines for the Diagnosis and Management of Patients with Thoracic Aortic Disease. Circulation. 2010; 121: Z610-R604 No demonstrable adenopathy. Gallbladder absent. Asymmetric density midportion right breast compared to the left.  Significance of this finding is uncertain. This finding may warrant mammographic correlation if mammography has not been performed Recently.  LHC 01/09/14 1. Left main: This was essentially a common ostium which trifurcated into an LAD, a ramus intermediate vessel, and left circumflex coronary artery  2. LAD: Angiographically normal 3. Ramus Intermediate: Angiographically normal 4. Left circumflex: Angiographically normal.  5. Right coronary artery: Angiographically normal dominant right coronary artery 6. EF 55-60%  Myoview 01/08/14 Intermediate risk study. Partially reversible mid to distal anteroapical defect (SDS 5), which could represent ischemia or less likely shifting breast artifact. Additional fixed inferior and lateral attenuation  artifact. LV EF 64% with no wall motion abnormalities. Chest pressure and left arm heaviness was noted with the administration of Lexiscan and subtle anterolateral ST depression and T-wave inversions were noted as well. Clinical correlation is advised.  Echo 6/13 Mild LVH, EF 50-55%, RWMA cannot be excluded, grade 2 diastolic dysfunction, mild MR, mild LAE, PASP 37 mmHg   Carotid US 6/13 No significant ICA stenosis    ASSESSMENT:    1. Thoracic ascending aortic aneurysm (HCC)   2. Shortness of breath   3. Diastolic dysfunction   4. Essential hypertension   5. Other chest pain     PLAN:  In order of problems listed above:  1. Ascending Thoracic Aortic Aneurysm - She has mild dilation of her ascending aorta on recent Chest CT. She will likely just need annual surveillance for now. She does note significant SOB. I will arrange a 2-D echo to assess her aortic root and rule out valvular disease.    2. Dyspnea - she does not appear volume overloaded on exam.  I will get a BMET, BNP.  If her BNP is high, I will stop her HCTZ and start Lasix.  Arrange Echo as noted.   3. Diastolic Dysfunction - As noted, she has been short of breath.  Her echo in 2013 demonstrated mod diastolic dysfunction.  Will check BNP and echo as noted.   4. HTN - BP uncontrolled.  Continue Lisinopril/HCTZ 10/12.5 mg QD.  With thoracic aneurysm, will start beta-blocker with Toprol-XL 50 mg QD.  5. Chest pain - Atypical.  Normal cath in 2014 is reassuring.  Suspect chest symptoms mainly related to recent pneumonia.  She also notes symptoms that may be related to meals.  Continue PPI.  She can discuss further with PCP.    Medication Adjustments/Labs and Tests Ordered: Current medicines are reviewed at length with the patient today.  Concerns regarding medicines are outlined above.  Medication changes, Labs and Tests ordered today are outlined in the Patient Instructions noted below.  Signed, Tereso Newcomer, PA-C    12/02/2015 5:47 PM    [facility] [address] [place], [place], [place]  [address] Phone: [phone]; Fax: [phone]   Patient Instructions  Medication Instructions:   START TAKING LISINOPRIL/HCTZ  ONCE A DAY   START TAKING TOPROL XL 50 MG ONCE A DAY   If you need a refill on your cardiac medications before your next appointment, please call your pharmacy.  Labwork: BMET AND BNP    Testing/Procedures: Your physician has requested that you have an echocardiogram. Echocardiography is a painless test that uses sound waves to create images of your heart. It provides your doctor with information about the size and shape of your heart and how well your heart's chambers and valves are working. This procedure takes approximately one hour. There are no restrictions for this procedure.   Follow-Up:  WITH DR Anne Fu IN 2 MONTHS    Any Other Special Instructions Will Be Listed Below (If Applicable).

## 2015-12-02 ENCOUNTER — Encounter: Payer: Self-pay | Admitting: Physician Assistant

## 2015-12-02 ENCOUNTER — Ambulatory Visit (INDEPENDENT_AMBULATORY_CARE_PROVIDER_SITE_OTHER): Payer: Medicare Other | Admitting: Physician Assistant

## 2015-12-02 VITALS — BP 140/90 | HR 86 | Ht 63.0 in | Wt 191.0 lb

## 2015-12-02 DIAGNOSIS — R0602 Shortness of breath: Secondary | ICD-10-CM

## 2015-12-02 DIAGNOSIS — R0789 Other chest pain: Secondary | ICD-10-CM

## 2015-12-02 DIAGNOSIS — I10 Essential (primary) hypertension: Secondary | ICD-10-CM

## 2015-12-02 DIAGNOSIS — I712 Thoracic aortic aneurysm, without rupture: Secondary | ICD-10-CM

## 2015-12-02 DIAGNOSIS — I5189 Other ill-defined heart diseases: Secondary | ICD-10-CM

## 2015-12-02 DIAGNOSIS — I7121 Aneurysm of the ascending aorta, without rupture: Secondary | ICD-10-CM

## 2015-12-02 DIAGNOSIS — I519 Heart disease, unspecified: Secondary | ICD-10-CM | POA: Diagnosis not present

## 2015-12-02 LAB — BASIC METABOLIC PANEL
BUN: 14 mg/dL (ref 7–25)
CHLORIDE: 98 mmol/L (ref 98–110)
CO2: 29 mmol/L (ref 20–31)
Calcium: 9.6 mg/dL (ref 8.6–10.4)
Creat: 0.9 mg/dL (ref 0.60–0.93)
Glucose, Bld: 125 mg/dL — ABNORMAL HIGH (ref 65–99)
POTASSIUM: 3.2 mmol/L — AB (ref 3.5–5.3)
Sodium: 136 mmol/L (ref 135–146)

## 2015-12-02 MED ORDER — METOPROLOL SUCCINATE ER 50 MG PO TB24: 50.0000 mg | ORAL_TABLET | Freq: Every day | ORAL | Status: DC

## 2015-12-02 NOTE — Patient Instructions (Addendum)
Medication Instructions:   START TAKING LISINOPRIL/HCTZ  ONCE A DAY   START TAKING TOPROL XL 50 MG ONCE A DAY   If you need a refill on your cardiac medications before your next appointment, please call your pharmacy.  Labwork: BMET AND BNP    Testing/Procedures: Your physician has requested that you have an echocardiogram. Echocardiography is a painless test that uses sound waves to create images of your heart. It provides your doctor with information about the size and shape of your heart and how well your heart's chambers and valves are working. This procedure takes approximately one hour. There are no restrictions for this procedure.   Follow-Up:  WITH DR Anne FuSKAINS IN 2 MONTHS    Any Other Special Instructions Will Be Listed Below (If Applicable).

## 2015-12-03 ENCOUNTER — Telehealth: Payer: Self-pay | Admitting: *Deleted

## 2015-12-03 DIAGNOSIS — E876 Hypokalemia: Secondary | ICD-10-CM

## 2015-12-03 LAB — BRAIN NATRIURETIC PEPTIDE: Brain Natriuretic Peptide: 5.6 pg/mL (ref 0.0–100.0)

## 2015-12-03 MED ORDER — POTASSIUM CHLORIDE CRYS ER 20 MEQ PO TBCR: 20.0000 meq | EXTENDED_RELEASE_TABLET | Freq: Every day | ORAL | Status: DC

## 2015-12-03 NOTE — Telephone Encounter (Signed)
Pt notified of lab results and findings by phone with verbal understanding to recommendations start K+ 20 meq daily, pt agreeable to plan of care. BMET 1/19 when she come in for echo.

## 2015-12-07 ENCOUNTER — Observation Stay (HOSPITAL_BASED_OUTPATIENT_CLINIC_OR_DEPARTMENT_OTHER): Payer: Medicare Other

## 2015-12-07 ENCOUNTER — Observation Stay (HOSPITAL_COMMUNITY)
Admission: EM | Admit: 2015-12-07 | Discharge: 2015-12-09 | Disposition: A | Discharge status: Home / Self Care | Payer: Medicare Other | Attending: Internal Medicine | Admitting: Internal Medicine

## 2015-12-07 ENCOUNTER — Observation Stay (HOSPITAL_COMMUNITY): Payer: Medicare Other

## 2015-12-07 ENCOUNTER — Emergency Department (HOSPITAL_COMMUNITY): Payer: Medicare Other

## 2015-12-07 ENCOUNTER — Encounter (HOSPITAL_COMMUNITY): Payer: Self-pay | Admitting: Emergency Medicine

## 2015-12-07 DIAGNOSIS — I10 Essential (primary) hypertension: Secondary | ICD-10-CM | POA: Diagnosis present

## 2015-12-07 DIAGNOSIS — E119 Type 2 diabetes mellitus without complications: Secondary | ICD-10-CM

## 2015-12-07 DIAGNOSIS — E118 Type 2 diabetes mellitus with unspecified complications: Secondary | ICD-10-CM | POA: Diagnosis not present

## 2015-12-07 DIAGNOSIS — R200 Anesthesia of skin: Secondary | ICD-10-CM | POA: Diagnosis not present

## 2015-12-07 DIAGNOSIS — I639 Cerebral infarction, unspecified: Secondary | ICD-10-CM | POA: Diagnosis not present

## 2015-12-07 DIAGNOSIS — I272 Other secondary pulmonary hypertension: Secondary | ICD-10-CM | POA: Diagnosis not present

## 2015-12-07 DIAGNOSIS — M25512 Pain in left shoulder: Secondary | ICD-10-CM | POA: Diagnosis present

## 2015-12-07 DIAGNOSIS — R2990 Unspecified symptoms and signs involving the nervous system: Secondary | ICD-10-CM

## 2015-12-07 DIAGNOSIS — E6609 Other obesity due to excess calories: Secondary | ICD-10-CM | POA: Diagnosis present

## 2015-12-07 DIAGNOSIS — E876 Hypokalemia: Secondary | ICD-10-CM | POA: Diagnosis not present

## 2015-12-07 DIAGNOSIS — Z7982 Long term (current) use of aspirin: Secondary | ICD-10-CM | POA: Insufficient documentation

## 2015-12-07 DIAGNOSIS — J0100 Acute maxillary sinusitis, unspecified: Secondary | ICD-10-CM | POA: Diagnosis not present

## 2015-12-07 DIAGNOSIS — K219 Gastro-esophageal reflux disease without esophagitis: Secondary | ICD-10-CM | POA: Diagnosis not present

## 2015-12-07 DIAGNOSIS — Z87891 Personal history of nicotine dependence: Secondary | ICD-10-CM | POA: Insufficient documentation

## 2015-12-07 DIAGNOSIS — IMO0002 Reserved for concepts with insufficient information to code with codable children: Secondary | ICD-10-CM

## 2015-12-07 DIAGNOSIS — I712 Thoracic aortic aneurysm, without rupture, unspecified: Secondary | ICD-10-CM

## 2015-12-07 DIAGNOSIS — G8929 Other chronic pain: Secondary | ICD-10-CM

## 2015-12-07 DIAGNOSIS — E669 Obesity, unspecified: Secondary | ICD-10-CM

## 2015-12-07 DIAGNOSIS — R4781 Slurred speech: Secondary | ICD-10-CM | POA: Diagnosis not present

## 2015-12-07 DIAGNOSIS — I5032 Chronic diastolic (congestive) heart failure: Secondary | ICD-10-CM | POA: Diagnosis not present

## 2015-12-07 DIAGNOSIS — R079 Chest pain, unspecified: Secondary | ICD-10-CM | POA: Diagnosis not present

## 2015-12-07 DIAGNOSIS — I7121 Aneurysm of the ascending aorta, without rupture: Secondary | ICD-10-CM | POA: Diagnosis present

## 2015-12-07 DIAGNOSIS — Z6835 Body mass index (BMI) 35.0-35.9, adult: Secondary | ICD-10-CM | POA: Diagnosis not present

## 2015-12-07 DIAGNOSIS — I6789 Other cerebrovascular disease: Secondary | ICD-10-CM | POA: Diagnosis not present

## 2015-12-07 DIAGNOSIS — R0789 Other chest pain: Secondary | ICD-10-CM

## 2015-12-07 DIAGNOSIS — J320 Chronic maxillary sinusitis: Secondary | ICD-10-CM | POA: Insufficient documentation

## 2015-12-07 DIAGNOSIS — I69354 Hemiplegia and hemiparesis following cerebral infarction affecting left non-dominant side: Principal | ICD-10-CM | POA: Insufficient documentation

## 2015-12-07 DIAGNOSIS — G459 Transient cerebral ischemic attack, unspecified: Secondary | ICD-10-CM

## 2015-12-07 DIAGNOSIS — R001 Bradycardia, unspecified: Secondary | ICD-10-CM

## 2015-12-07 DIAGNOSIS — R531 Weakness: Secondary | ICD-10-CM

## 2015-12-07 HISTORY — DX: Pneumonia, unspecified organism: J18.9

## 2015-12-07 LAB — URINALYSIS, ROUTINE W REFLEX MICROSCOPIC
BILIRUBIN URINE: NEGATIVE
GLUCOSE, UA: NEGATIVE mg/dL
Hgb urine dipstick: NEGATIVE
KETONES UR: NEGATIVE mg/dL
LEUKOCYTES UA: NEGATIVE
NITRITE: NEGATIVE
PROTEIN: NEGATIVE mg/dL
Specific Gravity, Urine: 1.005 (ref 1.005–1.030)
pH: 7 (ref 5.0–8.0)

## 2015-12-07 LAB — CBC
HCT: 44.1 % (ref 36.0–46.0)
HEMOGLOBIN: 14.9 g/dL (ref 12.0–15.0)
MCH: 30.2 pg (ref 26.0–34.0)
MCHC: 33.8 g/dL (ref 30.0–36.0)
MCV: 89.5 fL (ref 78.0–100.0)
Platelets: 290 10*3/uL (ref 150–400)
RBC: 4.93 MIL/uL (ref 3.87–5.11)
RDW: 14.6 % (ref 11.5–15.5)
WBC: 7.5 10*3/uL (ref 4.0–10.5)

## 2015-12-07 LAB — GLUCOSE, CAPILLARY
Glucose-Capillary: 150 mg/dL — ABNORMAL HIGH (ref 65–99)
Glucose-Capillary: 140 mg/dL — ABNORMAL HIGH (ref 65–99)

## 2015-12-07 LAB — RAPID URINE DRUG SCREEN, HOSP PERFORMED
AMPHETAMINES: NOT DETECTED
BARBITURATES: NOT DETECTED
Benzodiazepines: NOT DETECTED
Cocaine: NOT DETECTED
Opiates: NOT DETECTED
TETRAHYDROCANNABINOL: NOT DETECTED

## 2015-12-07 LAB — I-STAT CHEM 8, ED
BUN: 17 mg/dL (ref 6–20)
CREATININE: 0.8 mg/dL (ref 0.44–1.00)
Calcium, Ion: 1.08 mmol/L — ABNORMAL LOW (ref 1.13–1.30)
Chloride: 105 mmol/L (ref 101–111)
Glucose, Bld: 159 mg/dL — ABNORMAL HIGH (ref 65–99)
HEMATOCRIT: 49 % — AB (ref 36.0–46.0)
Hemoglobin: 16.7 g/dL — ABNORMAL HIGH (ref 12.0–15.0)
Potassium: 3.2 mmol/L — ABNORMAL LOW (ref 3.5–5.1)
Sodium: 141 mmol/L (ref 135–145)
TCO2: 20 mmol/L (ref 0–100)

## 2015-12-07 LAB — COMPREHENSIVE METABOLIC PANEL
ALBUMIN: 3.6 g/dL (ref 3.5–5.0)
ALK PHOS: 92 U/L (ref 38–126)
ALT: 11 U/L — ABNORMAL LOW (ref 14–54)
AST: 17 U/L (ref 15–41)
Anion gap: 16 — ABNORMAL HIGH (ref 5–15)
BILIRUBIN TOTAL: 0.6 mg/dL (ref 0.3–1.2)
BUN: 15 mg/dL (ref 6–20)
CALCIUM: 10 mg/dL (ref 8.9–10.3)
CO2: 21 mmol/L — ABNORMAL LOW (ref 22–32)
Chloride: 104 mmol/L (ref 101–111)
Creatinine, Ser: 1.05 mg/dL — ABNORMAL HIGH (ref 0.44–1.00)
GFR calc Af Amer: >60 mL/min (ref >60)
GFR calc non Af Amer: 53 mL/min — ABNORMAL LOW (ref >60)
GLUCOSE: 162 mg/dL — AB (ref 65–99)
Potassium: 3.4 mmol/L — ABNORMAL LOW (ref 3.5–5.1)
Sodium: 141 mmol/L (ref 135–145)
TOTAL PROTEIN: 6.9 g/dL (ref 6.5–8.1)

## 2015-12-07 LAB — DIFFERENTIAL
BASOS ABS: 0.1 10*3/uL (ref 0.0–0.1)
Basophils Relative: 1 %
Eosinophils Absolute: 0.1 10*3/uL (ref 0.0–0.7)
Eosinophils Relative: 2 %
LYMPHS ABS: 5 10*3/uL — AB (ref 0.7–4.0)
LYMPHS PCT: 66 %
Monocytes Absolute: 0.6 10*3/uL (ref 0.1–1.0)
Monocytes Relative: 8 %
NEUTROS ABS: 1.8 10*3/uL (ref 1.7–7.7)
NEUTROS PCT: 23 %

## 2015-12-07 LAB — CBC WITH DIFFERENTIAL/PLATELET
BASOS ABS: 0 10*3/uL (ref 0.0–0.1)
BASOS PCT: 0 %
EOS ABS: 0 10*3/uL (ref 0.0–0.7)
EOS PCT: 0 %
HEMATOCRIT: 42.4 % (ref 36.0–46.0)
Hemoglobin: 14.3 g/dL (ref 12.0–15.0)
Lymphocytes Relative: 13 %
Lymphs Abs: 0.5 10*3/uL — ABNORMAL LOW (ref 0.7–4.0)
MCH: 30.5 pg (ref 26.0–34.0)
MCHC: 33.7 g/dL (ref 30.0–36.0)
MCV: 90.4 fL (ref 78.0–100.0)
MONO ABS: 0.1 10*3/uL (ref 0.1–1.0)
Monocytes Relative: 2 %
NEUTROS ABS: 3.5 10*3/uL (ref 1.7–7.7)
Neutrophils Relative %: 85 %
PLATELETS: 269 10*3/uL (ref 150–400)
RBC: 4.69 MIL/uL (ref 3.87–5.11)
RDW: 14.9 % (ref 11.5–15.5)
WBC: 4.1 10*3/uL (ref 4.0–10.5)

## 2015-12-07 LAB — PROTIME-INR
INR: 1.06 (ref 0.00–1.49)
Prothrombin Time: 14 seconds (ref 11.6–15.2)

## 2015-12-07 LAB — BRAIN NATRIURETIC PEPTIDE: B Natriuretic Peptide: 36.8 pg/mL (ref 0.0–100.0)

## 2015-12-07 LAB — CBG MONITORING, ED
GLUCOSE-CAPILLARY: 311 mg/dL — AB (ref 65–99)
GLUCOSE-CAPILLARY: 257 mg/dL — AB (ref 65–99)
GLUCOSE-CAPILLARY: 156 mg/dL — AB (ref 65–99)
Glucose-Capillary: 192 mg/dL — ABNORMAL HIGH (ref 65–99)

## 2015-12-07 LAB — TROPONIN I

## 2015-12-07 LAB — APTT: APTT: 26 s (ref 24–37)

## 2015-12-07 LAB — ETHANOL: Alcohol, Ethyl (B): <5 mg/dL (ref <5)

## 2015-12-07 LAB — TSH: TSH: 0.322 u[IU]/mL — AB (ref 0.350–4.500)

## 2015-12-07 LAB — I-STAT TROPONIN, ED: Troponin i, poc: 0.01 ng/mL (ref 0.00–0.08)

## 2015-12-07 LAB — T4, FREE: FREE T4: 0.89 ng/dL (ref 0.61–1.12)

## 2015-12-07 MED ORDER — HYDROCHLOROTHIAZIDE 12.5 MG PO CAPS
12.5000 mg | ORAL_CAPSULE | Freq: Every day | ORAL | Status: DC
  Administered 2015-12-08 – 2015-12-09 (×2): 12.5 mg via ORAL
  Filled 2015-12-07 (×2): qty 1

## 2015-12-07 MED ORDER — MORPHINE SULFATE (PF) 4 MG/ML IV SOLN
4.0000 mg | Freq: Once | INTRAVENOUS | Status: AC
  Administered 2015-12-07: 4 mg via INTRAVENOUS
  Filled 2015-12-07: qty 1

## 2015-12-07 MED ORDER — ALBUTEROL SULFATE HFA 108 (90 BASE) MCG/ACT IN AERS: 1.0000 | INHALATION_SPRAY | Freq: Four times a day (QID) | RESPIRATORY_TRACT | Status: DC | PRN

## 2015-12-07 MED ORDER — ASPIRIN 300 MG RE SUPP
300.0000 mg | Freq: Every day | RECTAL | Status: DC
  Administered 2015-12-07: 300 mg via RECTAL
  Filled 2015-12-07: qty 1

## 2015-12-07 MED ORDER — INSULIN ASPART 100 UNIT/ML ~~LOC~~ SOLN: 0.0000 [IU] | Freq: Every day | SUBCUTANEOUS | Status: DC

## 2015-12-07 MED ORDER — METOPROLOL SUCCINATE ER 25 MG PO TB24
50.0000 mg | ORAL_TABLET | Freq: Every day | ORAL | Status: DC
  Administered 2015-12-08 – 2015-12-09 (×2): 50 mg via ORAL
  Filled 2015-12-07 (×2): qty 2

## 2015-12-07 MED ORDER — ASPIRIN 325 MG PO TABS
325.0000 mg | ORAL_TABLET | Freq: Every day | ORAL | Status: DC
  Administered 2015-12-08 – 2015-12-09 (×2): 325 mg via ORAL
  Filled 2015-12-07 (×2): qty 1

## 2015-12-07 MED ORDER — GLIPIZIDE 5 MG PO TABS
5.0000 mg | ORAL_TABLET | Freq: Two times a day (BID) | ORAL | Status: DC
  Administered 2015-12-08 – 2015-12-09 (×3): 5 mg via ORAL
  Filled 2015-12-07 (×3): qty 1

## 2015-12-07 MED ORDER — ASPIRIN 81 MG PO CHEW: 324.0000 mg | CHEWABLE_TABLET | Freq: Once | ORAL | Status: DC

## 2015-12-07 MED ORDER — SODIUM CHLORIDE 0.45 % IV SOLN
INTRAVENOUS | Status: DC
  Administered 2015-12-07: 18:00:00 via INTRAVENOUS

## 2015-12-07 MED ORDER — POTASSIUM CHLORIDE CRYS ER 20 MEQ PO TBCR
20.0000 meq | EXTENDED_RELEASE_TABLET | Freq: Every day | ORAL | Status: DC
  Administered 2015-12-08 – 2015-12-09 (×2): 20 meq via ORAL
  Filled 2015-12-07 (×2): qty 1

## 2015-12-07 MED ORDER — STROKE: EARLY STAGES OF RECOVERY BOOK
Freq: Once | Status: AC
  Administered 2015-12-07: 22:00:00

## 2015-12-07 MED ORDER — LISINOPRIL 10 MG PO TABS
10.0000 mg | ORAL_TABLET | Freq: Every day | ORAL | Status: DC
  Administered 2015-12-08 – 2015-12-09 (×2): 10 mg via ORAL
  Filled 2015-12-07 (×2): qty 1

## 2015-12-07 MED ORDER — ENOXAPARIN SODIUM 40 MG/0.4ML ~~LOC~~ SOLN
40.0000 mg | SUBCUTANEOUS | Status: DC
  Administered 2015-12-07 – 2015-12-08 (×2): 40 mg via SUBCUTANEOUS
  Filled 2015-12-07 (×2): qty 0.4

## 2015-12-07 MED ORDER — ALBUTEROL SULFATE (2.5 MG/3ML) 0.083% IN NEBU: 2.5000 mg | INHALATION_SOLUTION | RESPIRATORY_TRACT | Status: DC | PRN

## 2015-12-07 MED ORDER — ACETAMINOPHEN 650 MG RE SUPP
650.0000 mg | RECTAL | Status: DC | PRN
  Administered 2015-12-07: 650 mg via RECTAL
  Filled 2015-12-07: qty 1

## 2015-12-07 MED ORDER — LISINOPRIL-HYDROCHLOROTHIAZIDE 10-12.5 MG PO TABS: 1.0000 | ORAL_TABLET | Freq: Every day | ORAL | Status: DC

## 2015-12-07 MED ORDER — POTASSIUM CHLORIDE CRYS ER 20 MEQ PO TBCR
40.0000 meq | EXTENDED_RELEASE_TABLET | Freq: Once | ORAL | Status: AC
  Administered 2015-12-09: 40 meq via ORAL

## 2015-12-07 MED ORDER — LORAZEPAM 2 MG/ML IJ SOLN
0.5000 mg | Freq: Once | INTRAMUSCULAR | Status: AC
  Administered 2015-12-07: 0.5 mg via INTRAVENOUS
  Filled 2015-12-07: qty 1

## 2015-12-07 MED ORDER — INSULIN ASPART 100 UNIT/ML ~~LOC~~ SOLN
0.0000 [IU] | Freq: Three times a day (TID) | SUBCUTANEOUS | Status: DC
  Administered 2015-12-07: 1 [IU] via SUBCUTANEOUS
  Administered 2015-12-07: 7 [IU] via SUBCUTANEOUS
  Administered 2015-12-07: 5 [IU] via SUBCUTANEOUS
  Administered 2015-12-08: 2 [IU] via SUBCUTANEOUS
  Administered 2015-12-08: 1 [IU] via SUBCUTANEOUS
  Administered 2015-12-08: 2 [IU] via SUBCUTANEOUS
  Filled 2015-12-07 (×2): qty 1

## 2015-12-07 MED ORDER — ONDANSETRON HCL 4 MG/2ML IJ SOLN
4.0000 mg | Freq: Once | INTRAMUSCULAR | Status: AC
  Administered 2015-12-07: 4 mg via INTRAVENOUS
  Filled 2015-12-07: qty 2

## 2015-12-07 MED ORDER — IOHEXOL 350 MG/ML SOLN
100.0000 mL | Freq: Once | INTRAVENOUS | Status: AC | PRN
  Administered 2015-12-07: 100 mL via INTRAVENOUS

## 2015-12-07 MED ORDER — PANTOPRAZOLE SODIUM 40 MG PO TBEC
40.0000 mg | DELAYED_RELEASE_TABLET | Freq: Every day | ORAL | Status: DC
  Administered 2015-12-08 – 2015-12-09 (×2): 40 mg via ORAL
  Filled 2015-12-07 (×2): qty 1

## 2015-12-07 NOTE — ED Provider Notes (Signed)
MSE was initiated and I personally evaluated the patient and placed orders (if any) at  2:30 AM on December 07, 2015.  The patient appears stable so that the remainder of the MSE may be completed by another provider.  Patient presents as a code stroke. History of stroke with left-sided deficits. Last normal 10 PM. Reports increasing left upper extremity weakness and tingling as well as facial tingling. Also reports chest pain and shortness of breath. Nontoxic. Decreased grip strength as well as proximal muscle strength the left upper extremity.  No facial droop. Patient cleared for CT.  Shon Baton, MD 12/07/15 [phone]

## 2015-12-07 NOTE — ED Notes (Signed)
Daughter [phone] Selena Batten

## 2015-12-07 NOTE — ED Notes (Signed)
Patient remains in MRA

## 2015-12-07 NOTE — ED Provider Notes (Signed)
CSN: [id number]     Arrival date & time 12/07/15  0228 History  By signing my name below, I, Tanda Rockers, attest that this documentation has been prepared under the direction and in the presence of Shon Baton, MD. Electronically Signed: Tanda Rockers, ED Scribe. 12/07/2015. 2:59 AM.   Chief Complaint  Patient presents with  . Code Stroke   The history is provided by the patient. No language interpreter was used.     HPI Comments: [name] [name] is a 71 y.o. female  With history of hypertension, diabetes, thoracic aortic aneurysm , CVA who presents to the Emergency Department complaining of sudden onset, constant, left upper extremity weakness/tingling and facial tingling that began around 11 PM tonight (approximately 4 hours ago). She reports that around 10 PM (5 hours ago) she began having gradual onset, constant, gradually improving, 8/10, sharp, mid sternal chest pain with shortness of breath. Pt had similar left arm weakness and chest pain with previous stroke. She also complains of a headache at this time. Last normal was before 10 PM. Pt had a cough recently but denies fever or any other associated symptoms.   Past Medical History  Diagnosis Date  . Hypertension   . Diabetes mellitus   . Obesity (BMI 35.0-39.9 without comorbidity) (HCC)   . GERD (gastroesophageal reflux disease)   . Chest pain 2009    MV with no scar or ischemia, EF 65%  . History of cardiac catheterization     a. Myoview 2/15 with anteroapical ischemia, EF 64% (intermediate risk) >> LHC - normal cos, EF 55-60%  . Thoracic ascending aortic aneurysm (HCC)     a. CT 1/17: ascending aorta 4.2 x 4.0 cm  . Pneumonia    Past Surgical History  Procedure Laterality Date  . Wisdom tooth extraction    . Cholecystectomy    . Temporomandibular joint surgery    . Abdominal hysterectomy  2006    Partial, one ovary left   . Cardiac catheterization  2004    [place], [place] per pt.  . Left heart  catheterization with coronary angiogram N/A 01/09/2014    Procedure: LEFT HEART CATHETERIZATION WITH CORONARY ANGIOGRAM;  Surgeon: Marykay Lex, MD;  Location: [facility] CATH LAB;  Service: Cardiovascular;  Laterality: N/A;   Family History  Problem Relation Age of Onset  . Heart disease Father   . Diabetes Maternal Grandmother   . Pancreatitis Mother    Social History  Substance Use Topics  . Smoking status: Former Smoker -- 1.00 packs/day for .5 years    Types: Cigarettes    Quit date: 05/05/1969  . Smokeless tobacco: Never Used  . Alcohol Use: No   OB History    Gravida Para Term Preterm AB TAB SAB Ectopic Multiple Living   0 0 0 0 0 0 4      Obstetric Comments   Delivered 4 large babies vaginally     Review of Systems  Constitutional: Negative for fever.  Respiratory: Positive for cough and shortness of breath.   Cardiovascular: Positive for chest pain.  Musculoskeletal: Negative for neck pain.  Neurological: Positive for weakness and headaches.  All other systems reviewed and are negative.   Allergies  Codeine and Adhesive  Home Medications   Prior to Admission medications   Medication Sig Start Date End Date Taking? Authorizing Provider  albuterol (PROVENTIL HFA;VENTOLIN HFA) 108 (90 BASE) MCG/ACT inhaler Inhale 1-2 puffs into the lungs every 6 (six) hours as  needed for wheezing. 07/29/13  Yes Reuben Likes, MD  aspirin 81 MG tablet Take 81 mg by mouth daily.   Yes Historical Provider, MD  benzonatate (TESSALON) 200 MG capsule Take 200 mg by mouth 3 (three) times daily as needed for cough.   Yes Historical Provider, MD  doxycycline (VIBRAMYCIN) 100 MG capsule Take 1 capsule (100 mg total) by mouth 2 (two) times daily. 11/25/15  Yes Lavera Guise, MD  glipiZIDE (GLUCOTROL) 5 MG tablet Take 5 mg by mouth 2 (two) times daily before a meal.    Yes Historical Provider, MD  HYDROcodone-acetaminophen (NORCO/VICODIN) 5-325 MG tablet Take 1 tablet by mouth every 6 (six) hours  as needed for moderate pain or severe pain. 11/25/15  Yes Lavera Guise, MD  ibuprofen (ADVIL,MOTRIN) 200 MG tablet Take 200 mg by mouth every 6 (six) hours as needed for headache or mild pain.   Yes Historical Provider, MD  lisinopril-hydrochlorothiazide (PRINZIDE,ZESTORETIC) 10-12.5 MG per tablet Take 1 tablet by mouth daily.   Yes Historical Provider, MD  metoprolol succinate (TOPROL XL) 50 MG 24 hr tablet Take 1 tablet (50 mg total) by mouth daily. Take with or immediately following a meal. 12/02/15  Yes Scott T Weaver, PA-C  omeprazole (PRILOSEC) 40 MG capsule Take 40 mg by mouth daily.   Yes Historical Provider, MD  ONE TOUCH ULTRA TEST test strip 1 each by Other route as needed (BLOOD GLUCOSE MONITORING).  10/20/15  Yes Historical Provider, MD  potassium chloride SA (K-DUR,KLOR-CON) 20 MEQ tablet Take 1 tablet (20 mEq total) by mouth daily. 12/03/15  Yes Scott Moishe Spice, PA-C  diclofenac (VOLTAREN) 75 MG EC tablet Take 1 tablet (75 mg total) by mouth 2 (two) times daily. Patient not taking: Reported on 12/07/2015 04/22/15   Ozella Rocks, MD  estradiol (ESTRACE VAGINAL) 0.1 MG/GM vaginal cream 1 applicator 2 times per week Patient not taking: Reported on 12/07/2015 12/02/14   Allie Bossier, MD  lansoprazole (PREVACID) 30 MG capsule Take 1 capsule (30 mg total) by mouth daily before breakfast. Patient not taking: Reported on 12/07/2015 04/22/15   Ozella Rocks, MD  methocarbamol (ROBAXIN) 500 MG tablet Take 1-2 tablets (500-1,000 mg total) by mouth every 6 (six) hours as needed for muscle spasms. Patient not taking: Reported on 12/07/2015 04/22/15   Ozella Rocks, MD   Triage Vitals:  BP 160/96 mmHg  Pulse 89  Temp(Src) 97.8 F (36.6 C)  Resp 19  Ht 5\' 3"  (1.6 m)  Wt 193 lb (87.544 kg)  BMI 34.20 kg/m2  SpO2 97%   Physical Exam  Constitutional: She is oriented to person, place, and time.   Tearful, nontoxic  HENT:  Head: Normocephalic and atraumatic.  Eyes: EOM are normal. Pupils are equal,  round, and reactive to light.  Neck: Normal range of motion. Neck supple.  Cardiovascular: Normal rate, regular rhythm and normal heart sounds.   No murmur heard. Pulmonary/Chest: Effort normal and breath sounds normal. No respiratory distress. She has no wheezes. She has no rales.  Abdominal: Soft. Bowel sounds are normal. There is no tenderness. There is no rebound.  Musculoskeletal: She exhibits no edema.  Neurological: She is alert and oriented to person, place, and time.   Cranial nerves II through XII intact, left upper extremity drift noted, decreased grip strength on the left with drift noted in left lower extremity as well  Skin: Skin is warm and dry.  Psychiatric: She has a normal mood and affect.  Nursing note and vitals reviewed.   ED Course  Procedures (including critical care time)  DIAGNOSTIC STUDIES:  COORDINATION OF CARE: 2:58 AM-Discussed treatment plan which includes CXR, CT Head, Troponin, BNP, CBG, Chem 8, EtOH, Protime INR, APTT, CBC, Differential, CMP, Rapid drug screen, and UA with pt at bedside and pt agreed to plan.   Labs Review Labs Reviewed  DIFFERENTIAL - Abnormal; Notable for the following:    Lymphs Abs 5.0 (*)    All other components within normal limits  COMPREHENSIVE METABOLIC PANEL - Abnormal; Notable for the following:    Potassium 3.4 (*)    CO2 21 (*)    Glucose, Bld 162 (*)    Creatinine, Ser 1.05 (*)    ALT 11 (*)    GFR calc non Af Amer 53 (*)    Anion gap 16 (*)    All other components within normal limits  URINALYSIS, ROUTINE W REFLEX MICROSCOPIC (NOT AT [facility]) - Abnormal; Notable for the following:    APPearance CLOUDY (*)    All other components within normal limits  I-STAT CHEM 8, ED - Abnormal; Notable for the following:    Potassium 3.2 (*)    Glucose, Bld 159 (*)    Calcium, Ion 1.08 (*)    Hemoglobin 16.7 (*)    HCT 49.0 (*)    All other components within normal limits  CBG MONITORING, ED - Abnormal; Notable for the  following:    Glucose-Capillary 192 (*)    All other components within normal limits  ETHANOL  PROTIME-INR  APTT  CBC  URINE RAPID DRUG SCREEN, HOSP PERFORMED  BRAIN NATRIURETIC PEPTIDE  I-STAT TROPOININ, ED  Rosezena Sensor, ED    Imaging Review Dg Chest 2 View  12/07/2015  CLINICAL DATA:  Acute onset of centralized chest pain and shortness of breath. Initial encounter. EXAM: CHEST  2 VIEW COMPARISON:  Chest radiograph and CTA of the chest performed 11/25/2015 FINDINGS: The lungs are mildly hypoexpanded. Vascular crowding and mild vascular congestion are noted. There is mild elevation of the right hemidiaphragm. Increased interstitial markings could reflect minimal interstitial edema or mild pneumonia. There is no evidence of pleural effusion or pneumothorax. The heart is mildly enlarged. No acute osseous abnormalities are seen. Clips are noted within the right upper quadrant, reflecting prior cholecystectomy. IMPRESSION: Lungs mildly hypoexpanded. Mild vascular congestion and mild cardiomegaly noted. Mild elevation of the right hemidiaphragm. Increased interstitial markings could reflect minimal interstitial edema or mild pneumonia. Electronically Signed   By: Roanna Raider M.D.   On: 12/07/2015 03:44   Ct Head Wo Contrast  12/07/2015  CLINICAL DATA:  Acute onset of left-sided weakness and difficulty smiling. Code stroke. Initial encounter. EXAM: CT HEAD WITHOUT CONTRAST TECHNIQUE: Contiguous axial images were obtained from the base of the skull through the vertex without intravenous contrast. COMPARISON:  CT of the head performed 03/07/2015 FINDINGS: There is no evidence of acute infarction, mass lesion, or intra- or extra-axial hemorrhage on CT. A small chronic infarct is noted at the right external capsule. The posterior fossa, including the cerebellum, brainstem and fourth ventricle, is within normal limits. The third and lateral ventricles are unremarkable in appearance. The cerebral  hemispheres are symmetric in appearance, with normal gray-white differentiation. No mass effect or midline shift is seen. There is no evidence of fracture; visualized osseous structures are unremarkable in appearance. The visualized portions of the orbits are within normal limits. There is inspissated mucus within the right maxillary sinus. The remaining paranasal  sinuses and mastoid air cells are well-aerated. No significant soft tissue abnormalities are seen. IMPRESSION: 1. No acute intracranial pathology seen on CT. 2. Small chronic infarct at the right external capsule. 3. Inspissated mucus within the right maxillary sinus. These results were called by telephone at the time of interpretation on 12/07/2015 at 2:50 am to Dr. Roseanne Reno, who verbally acknowledged these results. Electronically Signed   By: Roanna Raider M.D.   On: 12/07/2015 02:51   Ct Angio Chest Aorta W/cm &/or Wo/cm  12/07/2015  CLINICAL DATA:  71 year old female with chest pain and shortness of breath EXAM: CT ANGIOGRAPHY CHEST WITH CONTRAST TECHNIQUE: Multidetector CT imaging of the chest was performed using the standard protocol during bolus administration of intravenous contrast. Multiplanar CT image reconstructions and MIPs were obtained to evaluate the vascular anatomy. CONTRAST:  OMNIPAQUE IOHEXOL 350 MG/ML SOLN COMPARISON:  Chest radiograph dated 12/07/2015 and CT dated 11/25/2015 FINDINGS: Bibasilar linear densities and ground-glass opacities most compatible with atelectatic changes. No focal consolidation, pleural effusion, or pneumothorax. The central airways are patent. The thoracic aorta is mildly tortuous otherwise unremarkable. Evaluation of the pulmonary artery is limited due to suboptimal opacification and timing of the contrast. No definite central pulmonary artery embolus identified. Mild cardiomegaly. No pericardial effusion. There is no hilar or mediastinal adenopathy. The esophagus is grossly unremarkable.  Subcentimeter left thyroid hypodense nodule. There is no axillary adenopathy. The chest wall soft tissues appear unremarkable. There is degenerative changes of the spine. No acute fracture. Constipation. Diverticulosis. Cholecystectomy. The visualized upper abdomen is otherwise unremarkable. Review of the MIP images confirms the above findings. IMPRESSION: No CT evidence of aortic dissection. No definite central pulmonary artery embolus. Electronically Signed   By: Elgie Collard M.D.   On: 12/07/2015 05:43   I have personally reviewed and evaluated these images and lab results as part of my medical decision-making.   EKG Interpretation None      MDM   Final diagnoses:  Stroke-like symptom  Other chest pain    Patient presents with strokelike symptoms as well as chest pain and shortness of breath. Tearful. Nontoxic on exam. She does have evidence of left upper extremity grip and decreased strength. Patient came in as a code stroke. She was cleared for CT. She was evaluated by neurology. Per neurology, she is not a candidate for TPA. She also has chest pain and shortness of breath. History of thoracic aortic aneurysm.   Nontoxic. EKG is nonischemic. Basic labwork obtained. CT head negative. Patient will need MRI and full stroke workup. She will also need chest pain evaluation. Chest x-ray without evidence of pneumonia.   Mild vascular congestion. Normal BNP.   Troponin is negative. CT angiogram of chest is negative for acute dissection. Will admit for further stroke workup and formal chest pain rule out.  I personally performed the services described in this documentation, which was scribed in my presence. The recorded information has been reviewed and is accurate.      Shon Baton, MD 12/07/15 [phone]

## 2015-12-07 NOTE — ED Notes (Signed)
CBG 311 

## 2015-12-07 NOTE — ED Notes (Signed)
Patient remains at [facility]

## 2015-12-07 NOTE — ED Notes (Signed)
Pt returned from MRI-- then taken back to MRI for [facility], pt is alert, oriented. Daughter at bedside.

## 2015-12-07 NOTE — H&P (Signed)
[facility] History and Physical                                                                                    [place] G[place], is a 70 y.o. female  MRN: [id number]   DOB - 04-15-1946  Admit Date - 12/07/2015  Outpatient Primary MD for the patient is Verlon Au, MD  Referring MD: Wilkie Aye / ER  Consulting M.D: Roseanne Reno / Neurology  PMH: Past Medical History  Diagnosis Date  . Hypertension   . Diabetes mellitus   . Obesity (BMI 35.0-39.9 without comorbidity) (HCC)   . GERD (gastroesophageal reflux disease)   . Chest pain 2009    MV with no scar or ischemia, EF 65%  . History of cardiac catheterization     a. Myoview 2/15 with anteroapical ischemia, EF 64% (intermediate risk) >> LHC - normal cos, EF 55-60%  . Thoracic ascending aortic aneurysm (HCC)     a. CT 1/17: ascending aorta 4.2 x 4.0 cm  . Pneumonia       PSH: Past Surgical History  Procedure Laterality Date  . Wisdom tooth extraction    . Cholecystectomy    . Temporomandibular joint surgery    . Abdominal hysterectomy  2006    Partial, one ovary left   . Cardiac catheterization  2004    [place], [place] per pt.  . Left heart catheterization with coronary angiogram N/A 01/09/2014    Procedure: LEFT HEART CATHETERIZATION WITH CORONARY ANGIOGRAM;  Surgeon: Marykay Lex, MD;  Location: [facility] CATH LAB;  Service: Cardiovascular;  Laterality: N/A;     CC:  Chief Complaint  Patient presents with  . 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 Because of presenting symptoms concerning for CVA stroke was initiated in the ER. Patient reported that the EDP as well  as to stroke physician that she had sudden onset of left upper extremity weakness with tingling and facial tingling that began at 11 PM or 4 hours prior to presentation. She also reported that 5 hours prior to presentation she began having gradual constant sharp midsternal chest pain with shortness of breath. Patient apparently had similar constellation of symptoms with prior stroke. She was also complaining of headache. Patient presented outside of window for TPA so this was not considered. In review of documentation patient had been evaluated in the ER on 1/5 with left upper quadrant abdominal pain/chest pain with descriptive factors of pain radiating from left flank in the left lower quadrant and into chest. Extensive workup was undertaken that did not reveal evidence of PE or ruptured aneurysm. There was a question of right lower lobe pneumonia on CT of the chest and upon further questioning the patient did endorse productive cough and was prescribed doxycycline at discharge. She was subsequently evaluated for routine follow-up at her cardiologist's office on 1/11 and  at that time reported that her chest pain had improved with utilization of the previously prescribed antibiotics. It was documented during that visit patient has had chest pain "for years". Per provider documentation it was felt she had pleuritic-type symptoms with her chest pain and also continue to have issues with coughing. She's also noted to have chronic dyspnea on exertion that appears to have worsened over the past several months. She has significant chronic lower extremity edema.  In further discussion with the patient the weakness in her left side is unchanged from her baseline from prior stroke. She did notice a new "twisted mouth" on the right side which has resolved. She also had numbness and tingling in the left side which has resolved since arrival. In regards to her chest pain patient laid down to go to bed last night around 8 PM  was uncomfortable turned multiple times in the bed and when she repositioned herself left side she noticed increasing pain that did not improve with sitting up and became more progressive. She does recall EMS giving her 4 aspirin on the way to the hospital but doesn't recall if they immediately helped her chest discomfort although she reports her chest discomfort is gone now. She also reports the chest pain was reproducible on the center of her chest with minimal palpation. He reports her previous cough has resolved. She completed her doxycycline this past Sunday. He has chronic swelling in the right lower extremity after experiencing a meniscal tear in April 2016.  ER Evaluation and treatment: Afebrile, initial BP 134/90, sinus rhythm, room air saturations 96% and improved to 97% with application of 2 L oxygen CT head without contrast: No acute intracranial pathology Two-view chest x-ray: Mild vascular congestion and mild cardiomegaly CT angiography of the chest and aorta: No CT evidence of aortic dissection and no definitive central pulmonary artery embolism although evaluation of the pulmonary artery limited due to suboptimal opacification and timing of the contrast. 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 Potassium 3.2 BUN 17 creatinine 0.8 Glucose 159 BNP 37 Troponin 0.01 Hemoglobin 16 and hematocrit 49 (POC) Urinalysis with cloudy appearance and low specific gravity 1.005 otherwise unremarkable Urine drug screen negative Morphine 4 mg IV 1 Zofran 4 mg IV 1 Aspirin 324 mg Ativan 0.5 mg IV 1  Review of Systems   In addition to the HPI above,  No Fever-chills, myalgias or other constitutional symptoms No Headache, changes with Vision or hearing No problems swallowing food or Liquids, indigestion/reflux No palpitations,  change in chronic 3 pillow orthopnea  No Abdominal pain, N/V; no melena or hematochezia, no dark tarry stools No dysuria, hematuria or flank pain No new skin rashes, lesions, masses or bruises, No new joints pains-aches No recent weight gain or loss No polyuria, polydypsia or polyphagia,  *A full 10 point Review of Systems was done, except as stated above, all other Review of Systems were negative.  Social History Social History  Substance Use Topics  . Smoking status: Former Smoker -- 1.00 packs/day for .5 years    Types: Cigarettes    Quit date: 05/05/1969  . Smokeless tobacco: Never Used  . Alcohol Use: No    Resides at: Private residence  Lives with: Alone  Ambulatory status: Occasionally requires cane or rolling walker but no reports of recent falls   Family History Family  History  Problem Relation Age of Onset  . Heart disease Father   . Diabetes Maternal Grandmother   . Pancreatitis Mother      Prior to Admission medications   Medication Sig Start Date End Date Taking? Authorizing Provider  albuterol (PROVENTIL HFA;VENTOLIN HFA) 108 (90 BASE) MCG/ACT inhaler Inhale 1-2 puffs into the lungs every 6 (six) hours as needed for wheezing. 07/29/13  Yes Reuben Likes, MD  aspirin 81 MG tablet Take 81 mg by mouth daily.   Yes Historical Provider, MD  benzonatate (TESSALON) 200 MG capsule Take 200 mg by mouth 3 (three) times daily as needed for cough.   Yes Historical Provider, MD  doxycycline (VIBRAMYCIN) 100 MG capsule Take 1 capsule (100 mg total) by mouth 2 (two) times daily. 11/25/15  Yes Lavera Guise, MD  glipiZIDE (GLUCOTROL) 5 MG tablet Take 5 mg by mouth 2 (two) times daily before a meal.    Yes Historical Provider, MD  HYDROcodone-acetaminophen (NORCO/VICODIN) 5-325 MG tablet Take 1 tablet by mouth every 6 (six) hours as needed for moderate pain or severe pain. 11/25/15  Yes Lavera Guise, MD  ibuprofen (ADVIL,MOTRIN) 200 MG tablet Take 200 mg by mouth every 6 (six)  hours as needed for headache or mild pain.   Yes Historical Provider, MD  lisinopril-hydrochlorothiazide (PRINZIDE,ZESTORETIC) 10-12.5 MG per tablet Take 1 tablet by mouth daily.   Yes Historical Provider, MD  metoprolol succinate (TOPROL XL) 50 MG 24 hr tablet Take 1 tablet (50 mg total) by mouth daily. Take with or immediately following a meal. 12/02/15  Yes Scott T Weaver, PA-C  omeprazole (PRILOSEC) 40 MG capsule Take 40 mg by mouth daily.   Yes Historical Provider, MD  ONE TOUCH ULTRA TEST test strip 1 each by Other route as needed (BLOOD GLUCOSE MONITORING).  10/20/15  Yes Historical Provider, MD  potassium chloride SA (K-DUR,KLOR-CON) 20 MEQ tablet Take 1 tablet (20 mEq total) by mouth daily. 12/03/15  Yes Scott Moishe Spice, PA-C  diclofenac (VOLTAREN) 75 MG EC tablet Take 1 tablet (75 mg total) by mouth 2 (two) times daily. Patient not taking: Reported on 12/07/2015 04/22/15   Ozella Rocks, MD  estradiol (ESTRACE VAGINAL) 0.1 MG/GM vaginal cream 1 applicator 2 times per week Patient not taking: Reported on 12/07/2015 12/02/14   Allie Bossier, MD  lansoprazole (PREVACID) 30 MG capsule Take 1 capsule (30 mg total) by mouth daily before breakfast. Patient not taking: Reported on 12/07/2015 04/22/15   Ozella Rocks, MD  methocarbamol (ROBAXIN) 500 MG tablet Take 1-2 tablets (500-1,000 mg total) by mouth every 6 (six) hours as needed for muscle spasms. Patient not taking: Reported on 12/07/2015 04/22/15   Ozella Rocks, MD    Allergies  Allergen Reactions  . Codeine Nausea And Vomiting  . Adhesive [Tape] Rash    pls use elastic wraps instead of adhesive tape    Physical Exam  Vitals  Blood pressure 160/96, pulse 89, temperature 97.8 F (36.6 C), resp. rate 19, height 5\' 3"  (1.6 m), weight 193 lb (87.544 kg), SpO2 97 %.   General:  In no acute distress, appears healthy and well nourished  Psych:  Normal affect, Denies Suicidal or Homicidal ideations, Awake Alert, Oriented X 3. Speech and  thought patterns are clear and appropriate, no apparent short term memory deficits  Neuro:   No focal neurological deficits, CN II through XII intact, Strength 5/5 on right and 4/5 on left with left upper extremity strength  being affected by left shoulder discomfort, Sensation intact all 4 extremities.  ENT:  Ears and Eyes appear Normal, Conjunctivae clear, PER. Moist oral mucosa without erythema or exudates.  Neck:  Supple, No lymphadenopathy appreciated  Respiratory:  Symmetrical chest wall movement, Good air movement bilaterally, CTAB. Room Air; focal area of chest pain reproducible with palpation over center of chest  Cardiac:  RRR, No Murmurs, trace to 1+ bilateral LE edema noted right greater than left, no JVD, No carotid bruits, peripheral pulses palpable at 2+  Abdomen:  Positive bowel sounds, Soft, Non tender, Non distended,  No masses appreciated, no obvious hepatosplenomegaly  Skin:  No Cyanosis, Normal Skin Turgor, No Skin Rash or Bruise.  Extremities: Symmetrical without obvious trauma or injury,  no effusions.  Data Review  CBC  Recent Labs Lab 12/07/15 0233 12/07/15 0243  WBC 7.5  --   HGB 14.9 16.7*  HCT 44.1 49.0*  PLT 290  --   MCV 89.5  --   MCH 30.2  --   MCHC 33.8  --   RDW 14.6  --   LYMPHSABS 5.0*  --   MONOABS 0.6  --   EOSABS 0.1  --   BASOSABS 0.1  --     Chemistries   Recent Labs Lab 12/02/15 1330 12/07/15 0233 12/07/15 0243  NA 136 141 141  K 3.2* 3.4* 3.2*  CL 98 104 105  CO2 29 21*  --   GLUCOSE 125* 162* 159*  BUN 14 15 17   CREATININE 0.90 1.05* 0.80  CALCIUM 9.6 10.0  --   AST  --  17  --   ALT  --  11*  --   ALKPHOS  --  92  --   BILITOT  --  0.6  --     estimated creatinine clearance is 68.6 mL/min (by C-G formula based on Cr of 0.8).  No results for input(s): TSH, T4TOTAL, T3FREE, THYROIDAB in the last 72 hours.  Invalid input(s): FREET3  Coagulation profile  Recent Labs Lab 12/07/15 0233  INR 1.06    No  results for input(s): DDIMER in the last 72 hours.  Cardiac Enzymes No results for input(s): CKMB, TROPONINI, MYOGLOBIN in the last 168 hours.  Invalid input(s): CK  Invalid input(s): POCBNP  Urinalysis    Component Value Date/Time   COLORURINE YELLOW 12/07/2015 0330   APPEARANCEUR CLOUDY* 12/07/2015 0330   LABSPEC 1.005 12/07/2015 0330   PHURINE 7.0 12/07/2015 0330   GLUCOSEU NEGATIVE 12/07/2015 0330   HGBUR NEGATIVE 12/07/2015 0330   BILIRUBINUR NEGATIVE 12/07/2015 0330   KETONESUR NEGATIVE 12/07/2015 0330   PROTEINUR NEGATIVE 12/07/2015 0330   UROBILINOGEN 1.0 03/07/2015 1720   NITRITE NEGATIVE 12/07/2015 0330   LEUKOCYTESUR NEGATIVE 12/07/2015 0330    Imaging results:   Dg Chest 2 View  12/07/2015  CLINICAL DATA:  Acute onset of centralized chest pain and shortness of breath. Initial encounter. EXAM: CHEST  2 VIEW COMPARISON:  Chest radiograph and CTA of the chest performed 11/25/2015 FINDINGS: The lungs are mildly hypoexpanded. Vascular crowding and mild vascular congestion are noted. There is mild elevation of the right hemidiaphragm. Increased interstitial markings could reflect minimal interstitial edema or mild pneumonia. There is no evidence of pleural effusion or pneumothorax. The heart is mildly enlarged. No acute osseous abnormalities are seen. Clips are noted within the right upper quadrant, reflecting prior cholecystectomy. IMPRESSION: Lungs mildly hypoexpanded. Mild vascular congestion and mild cardiomegaly noted. Mild elevation of the right hemidiaphragm. Increased interstitial markings  could reflect minimal interstitial edema or mild pneumonia. Electronically Signed   By: Roanna Raider M.D.   On: 12/07/2015 03:44   Ct Head Wo Contrast  12/07/2015  CLINICAL DATA:  Acute onset of left-sided weakness and difficulty smiling. Code stroke. Initial encounter. EXAM: CT HEAD WITHOUT CONTRAST TECHNIQUE: Contiguous axial images were obtained from the base of the skull through  the vertex without intravenous contrast. COMPARISON:  CT of the head performed 03/07/2015 FINDINGS: There is no evidence of acute infarction, mass lesion, or intra- or extra-axial hemorrhage on CT. A small chronic infarct is noted at the right external capsule. The posterior fossa, including the cerebellum, brainstem and fourth ventricle, is within normal limits. The third and lateral ventricles are unremarkable in appearance. The cerebral hemispheres are symmetric in appearance, with normal gray-white differentiation. No mass effect or midline shift is seen. There is no evidence of fracture; visualized osseous structures are unremarkable in appearance. The visualized portions of the orbits are within normal limits. There is inspissated mucus within the right maxillary sinus. The remaining paranasal sinuses and mastoid air cells are well-aerated. No significant soft tissue abnormalities are seen. IMPRESSION: 1. No acute intracranial pathology seen on CT. 2. Small chronic infarct at the right external capsule. 3. Inspissated mucus within the right maxillary sinus. These results were called by telephone at the time of interpretation on 12/07/2015 at 2:50 am to Dr. Roseanne Reno, who verbally acknowledged these results. Electronically Signed   By: Roanna Raider M.D.   On: 12/07/2015 02:51   Ct Angio Chest Pe W/cm &/or Wo Cm  11/25/2015  CLINICAL DATA:  Shortness of breath and chest pain for 10 days. Progressed past 2 days EXAM: CT ANGIOGRAPHY CHEST WITH CONTRAST TECHNIQUE: Multidetector CT imaging of the chest was performed using the standard protocol during bolus administration of intravenous contrast. Multiplanar CT image reconstructions and MIPs were obtained to evaluate the vascular anatomy. CONTRAST:  70mL OMNIPAQUE IOHEXOL 350 MG/ML SOLN COMPARISON:  Chest radiograph November 25, 2015 FINDINGS: There is no demonstrable pulmonary embolus. There is tortuosity in the thoracic aorta. The at ascending thoracic aorta is  prominent, measuring 4.2 x 4.0 cm. There is no thoracic aortic dissection. The visualized great vessels appear unremarkable. There is patchy bibasilar atelectatic change with mild consolidation in the posterior right base. Lungs elsewhere clear. The visualized thyroid appears unremarkable. There is no appreciable thoracic adenopathy. The pericardium is not thickened. In the visualized upper abdomen, the gallbladder is absent. The visualized upper abdominal structures otherwise appear unremarkable. There are no blastic or lytic bone lesions. There are scattered foci of degenerative change in the thoracic spine. There is asymmetric density in the mid right breast compared to the left, best seen on axial slice 60 series 401. Review of the MIP images confirms the above findings. IMPRESSION: No demonstrable pulmonary embolus. Patchy bibasilar atelectasis with mild consolidation posterior right base. Prominence of the ascending thoracic aorta measuring 4.2 x 4.0 cm. Recommend annual imaging followup by CTA or MRA. This recommendation follows 2010 ACCF/AHA/AATS/ACR/ASA/SCA/SCAI/SIR/STS/SVM Guidelines for the Diagnosis and Management of Patients with Thoracic Aortic Disease. Circulation. 2010; 121: W098-J191 No demonstrable adenopathy.  Gallbladder absent. Asymmetric density midportion right breast compared to the left. Significance of this finding is uncertain. This finding may warrant mammographic correlation if mammography has not been performed recently. Electronically Signed   By: Bretta Bang III M.D.   On: 11/25/2015 14:23   Dg Abd Acute W/chest  11/25/2015  CLINICAL DATA:  Acute onset of left  lower chest pain and left upper quadrant abdominal pain. Vomiting and cough. Initial encounter. EXAM: DG ABDOMEN ACUTE W/ 1V CHEST COMPARISON:  Chest radiograph performed 01/06/2014, MRI of the abdomen performed 04/28/2009 FINDINGS: The lungs are well-aerated. Minimal bibasilar atelectasis is noted. There is no evidence  of pleural effusion or pneumothorax. The cardiomediastinal silhouette is borderline normal in size. The visualized bowel gas pattern is unremarkable. Scattered stool and air are seen within the colon; there is no evidence of small bowel dilatation to suggest obstruction. No free intra-abdominal air is identified on the provided upright view. Clips are noted within the right upper quadrant, reflecting prior cholecystectomy. No acute osseous abnormalities are seen; the sacroiliac joints are unremarkable in appearance. IMPRESSION: 1. Unremarkable bowel gas pattern; no free intra-abdominal air seen. Small to moderate amount of stool noted in the colon. 2. Minimal bibasilar atelectasis noted.  Lungs otherwise clear. Electronically Signed   By: Roanna Raider M.D.   On: 11/25/2015 06:54   Ct Angio Chest Aorta W/cm &/or Wo/cm  12/07/2015  CLINICAL DATA:  70 year old female with chest pain and shortness of breath EXAM: CT ANGIOGRAPHY CHEST WITH CONTRAST TECHNIQUE: Multidetector CT imaging of the chest was performed using the standard protocol during bolus administration of intravenous contrast. Multiplanar CT image reconstructions and MIPs were obtained to evaluate the vascular anatomy. CONTRAST:  OMNIPAQUE IOHEXOL 350 MG/ML SOLN COMPARISON:  Chest radiograph dated 12/07/2015 and CT dated 11/25/2015 FINDINGS: Bibasilar linear densities and ground-glass opacities most compatible with atelectatic changes. No focal consolidation, pleural effusion, or pneumothorax. The central airways are patent. The thoracic aorta is mildly tortuous otherwise unremarkable. Evaluation of the pulmonary artery is limited due to suboptimal opacification and timing of the contrast. No definite central pulmonary artery embolus identified. Mild cardiomegaly. No pericardial effusion. There is no hilar or mediastinal adenopathy. The esophagus is grossly unremarkable. Subcentimeter left thyroid hypodense nodule. There is no axillary adenopathy.  The chest wall soft tissues appear unremarkable. There is degenerative changes of the spine. No acute fracture. Constipation. Diverticulosis. Cholecystectomy. The visualized upper abdomen is otherwise unremarkable. Review of the MIP images confirms the above findings. IMPRESSION: No CT evidence of aortic dissection. No definite central pulmonary artery embolus. Electronically Signed   By: Elgie Collard M.D.   On: 12/07/2015 05:43   Ct Renal Stone Study  11/25/2015  CLINICAL DATA:  Pt having left flank pain since yesterday. Pt doesn't have any other complaints. EXAM: CT ABDOMEN AND PELVIS WITHOUT CONTRAST TECHNIQUE: Multidetector CT imaging of the abdomen and pelvis was performed following the standard protocol without IV contrast. COMPARISON:  None. FINDINGS: Lower chest: Minimal scarring at both lung bases. Heart size is normal. No imaged pericardial effusion or significant coronary artery calcifications. Upper abdomen: No focal abnormality identified within the liver, spleen, pancreas, or adrenal glands. Kidneys are symmetric in appearance. No hydronephrosis or intrarenal calculi. Gallbladder is surgically absent. Gastrointestinal tract: Stomach and small bowel loops are normal in appearance. The appendix is well seen and has a normal appearance. There is moderate colonic diverticulosis without associated inflammation. Pelvis: Status post hysterectomy. Retroperitoneum: There is moderate atherosclerosis of the abdominal aorta. No aneurysm. No retroperitoneal or mesenteric adenopathy. Abdominal wall: Unremarkable. Osseous structures: Degenerative changes are seen in the lower thoracic and lumbar spine. No suspicious lytic or blastic lesions are identified. IMPRESSION: 1. Colonic diverticulosis.  No acute diverticulitis. 2. No renal or ureteral stones. 3. Status post cholecystectomy aunts hysterectomy. 4. No retroperitoneal adenopathy. Electronically Signed   By: Lanora Manis  Manson Passey M.D.   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   Assessment & Plan  Principal Problem:   Acute left-sided weakness/history of CVA with prior residual left-sided deficit -Admit to telemetry/observation -MRI/MRA brain -Neurology following -Echocardiogram -PT/OT/SLP evaluation -Antiplatelet with full dose aspirin -Hemoglobin A1c and lipid panel -Patient reports issues with difficulty swallowing certain food consistencies such as bread and chicken since stroke; have ordered D3 diet and SLP evaluation pending -Question if recent right lower lobe pneumonia related to chronic recurrent aspiration  Active Problems:   Hypokalemia -Suspect related to chronic use of thiazide diuretic and likely will require chronic potassium repletion-at last cardiology visit on 1/11 was started on potassium 20 mEq daily -Kdur 40 mEq now -Lab in a.m.   Thoracic aneurysm -Incidental finding on ER CTA of chest on 1/5 -Cardiology following and had ordered outpatient echo to be done this week but we are completing during this hospitalization so need to follow up -We'll likely need at minimum outpatient VVS evaluation    Chronic chest pain -Documented as having a chronic component based on outpatient cardiology evaluation on 1/11 as well as a focal area of reproducible anterior chest wall pain -Cycle troponin and follow-up on above echocardiogram -Prior NM stress February 2015 was suggestive of anterior apical ischemia but left heart cath demonstrated normal coronary arteries -Recently treated as an outpatient with doxycycline for presumed pneumonia/bronchitis -Chest pain may be reflective of restrictive cardiomyopathy - patient may require more aggressive diuresis -EKG was unremarkable    Diabetes mellitus, type 2  -Continue  preadmission Glucotrol -SSI -Hgb A1c    Hypertension -Moderate controlled -Continue preadmission lisinopril with HCTZ, Toprol-XL -In discussion with the patient it was determined that she misunderstood instructions at the cardiology office. Instead of taking both the ACE inhibitor with hydrochlorothiazide and the beta blocker (newly prescribed) patient was only taking the beta blocker    Obesity (BMI 35.0-39.9 without comorbidity) -Continue preadmission weight reduction strategies and discussed with PCP    Chronic diastolic heart failure, NYHA class 2 /pulmonary hypertension -Chest x-ray questioning some mild vascular congestion and patient does have chronic lower extremity edema/3 pillow orthopnea -She also has chronic dyspnea on exertion which is reported to be worsened over the past few months; concern patient may need more aggressive diuresis -Has preserved LV function/normal EF -May have a degree of mild decompensated heart failure since has not taken her ACE/HCTZ combination since 1/11 due to misunderstanding regarding addition of new medication-resume both now   Acute on chronic right maxillary sinusitis -Currently asymptomatic -Recently completed doxycycline as an outpatient this past Sunday    DVT Prophylaxis: Lovenox  Family Communication:   Daughter at bedside  Code Status:  Full code  Condition:  Stable  Discharge disposition: Anticipate discharge back to preadmission environment once medically stable and stroke evaluation completed; likely within next 48 hours  Time spent in minutes : 60      Kristell Wooding L. ANP on 12/07/2015 at 7:28 AM  You may contact me by going to [email or web address] - password [email or web address]  I am available from 7a-7p but please confirm I am on the schedule by going to Amion as above.   After 7p please contact night coverage person covering me after hours  [facility]

## 2015-12-07 NOTE — Consult Note (Signed)
Admission H&P    Chief Complaint: New onset left-sided numbness and weakness.  HPI: [name] [name] is an 70 y.o. female history of previous stroke, hypertension and diabetes mellitus, as well as ascending aortic aneurysm, presenting with new onset numbness and weakness of a left-sided upper extremity as well as left facial numbness and slurred speech. Onset of deficits was at 10 PM. Patient has been taking aspirin daily. CT scan of her head showed no acute intracranial abnormality. Strength of left upper extremity improved slightly after arriving in the emergency room. NIH stroke score was 4.  LSN: 10 PM on 12/06/2015 tPA Given: No: Beyond time window for treatment consideration mRankin:  Past Medical History  Diagnosis Date  . Hypertension   . Diabetes mellitus   . Obesity (BMI 35.0-39.9 without comorbidity) (HCC)   . GERD (gastroesophageal reflux disease)   . Chest pain 2009    MV with no scar or ischemia, EF 65%  . History of cardiac catheterization     a. Myoview 2/15 with anteroapical ischemia, EF 64% (intermediate risk) >> LHC - normal cos, EF 55-60%  . Thoracic ascending aortic aneurysm (HCC)     a. CT 1/17: ascending aorta 4.2 x 4.0 cm    Past Surgical History  Procedure Laterality Date  . Wisdom tooth extraction    . Cholecystectomy    . Temporomandibular joint surgery    . Abdominal hysterectomy  2006    Partial, one ovary left   . Cardiac catheterization  2004    [place], [place] per pt.  . Left heart catheterization with coronary angiogram N/A 01/09/2014    Procedure: LEFT HEART CATHETERIZATION WITH CORONARY ANGIOGRAM;  Surgeon: Marykay Lex, MD;  Location: [facility] CATH LAB;  Service: Cardiovascular;  Laterality: N/A;    Family History  Problem Relation Age of Onset  . Heart disease Father   . Diabetes Maternal Grandmother   . Pancreatitis Mother    Social History:  reports that she quit smoking about 46 years ago. Her smoking use included Cigarettes. She has  a .5 pack-year smoking history. She has never used smokeless tobacco. She reports that she does not drink alcohol or use illicit drugs.  Allergies:  Allergies  Allergen Reactions  . Codeine Nausea And Vomiting  . Adhesive [Tape] Rash    pls use elastic wraps instead of adhesive tape    Medications: Patient's preadmission medications were reviewed by me.  ROS: History obtained from the patient  General ROS: negative for - chills, fatigue, fever, night sweats, weight gain or weight loss Psychological ROS: negative for - behavioral disorder, hallucinations, memory difficulties, mood swings or suicidal ideation Ophthalmic ROS: negative for - blurry vision, double vision, eye pain or loss of vision ENT ROS: negative for - epistaxis, nasal discharge, oral lesions, sore throat, tinnitus or vertigo Allergy and Immunology ROS: negative for - hives or itchy/watery eyes Hematological and Lymphatic ROS: negative for - bleeding problems, bruising or swollen lymph nodes Endocrine ROS: negative for - galactorrhea, hair pattern changes, polydipsia/polyuria or temperature intolerance Respiratory ROS: Complaining of chest pain and difficulty breathing when lying flat Cardiovascular ROS: Known ascending aortic aneurysm Gastrointestinal ROS: negative for - abdominal pain, diarrhea, hematemesis, nausea/vomiting or stool incontinence Genito-Urinary ROS: negative for - dysuria, hematuria, incontinence or urinary frequency/urgency Musculoskeletal ROS: negative for - joint swelling or muscular weakness Neurological ROS: as noted in HPI Dermatological ROS: negative for rash and skin lesion changes  Physical Examination: There were no vitals taken for this  visit.  HEENT-  Normocephalic, no lesions, without obvious abnormality.  Normal external eye and conjunctiva.  Normal TM's bilaterally.  Normal auditory canals and external ears. Normal external nose, mucus membranes and septum.  Normal pharynx. Neck  supple with no masses, nodes, nodules or enlargement. Cardiovascular - regular rate and rhythm, S1, S2 normal, no murmur, click, rub or gallop Lungs - chest clear, no wheezing, rales, normal symmetric air entry Abdomen - soft, non-tender; bowel sounds normal; no masses,  no organomegaly Extremities - no joint deformities, effusion, or inflammation and no edema  Neurologic Examination: Mental Status: Alert, oriented, thought content appropriate.  Speech slightly slurred without evidence of aphasia. Able to follow commands without difficulty. Cranial Nerves: II-Visual fields were normal. III/IV/VI-Pupils were equal and reacted normally to light. Extraocular movements were full and conjugate.    V/VII-reduced perception of tactile sensation of left-sided face compared to the right; minimal left lower facial weakness. VIII-normal. X-slight dysarthria. XI: trapezius strength/neck flexion strength normal bilaterally XII-midline tongue extension with normal strength. Motor: Mild drift of left upper extremity; motor exam otherwise unremarkable Sensory: Reduced perception of tactile sensation over left extremities compared to right extremities. Deep Tendon Reflexes: 1+ and symmetric. Plantars: Mute bilaterally Cerebellar: Normal finger-to-nose testing.  Results for orders placed or performed during the hospital encounter of 12/07/15 (from the past 48 hour(s))  CBC     Status: None   Collection Time: 12/07/15  2:33 AM  Result Value Ref Range   WBC 7.5 4.0 - 10.5 K/uL   RBC 4.93 3.87 - 5.11 MIL/uL   Hemoglobin 14.9 12.0 - 15.0 g/dL   HCT 78.2 95.6 - 21.3 %   MCV 89.5 78.0 - 100.0 fL   MCH 30.2 26.0 - 34.0 pg   MCHC 33.8 30.0 - 36.0 g/dL   RDW 08.6 57.8 - 46.9 %   Platelets 290 150 - 400 K/uL  Differential     Status: Abnormal   Collection Time: 12/07/15  2:33 AM  Result Value Ref Range   Neutrophils Relative % 23 %   Neutro Abs 1.8 1.7 - 7.7 K/uL   Lymphocytes Relative 66 %   Lymphs  Abs 5.0 (H) 0.7 - 4.0 K/uL   Monocytes Relative 8 %   Monocytes Absolute 0.6 0.1 - 1.0 K/uL   Eosinophils Relative 2 %   Eosinophils Absolute 0.1 0.0 - 0.7 K/uL   Basophils Relative 1 %   Basophils Absolute 0.1 0.0 - 0.1 K/uL  I-stat troponin, ED (not at [facility], [facility])     Status: None   Collection Time: 12/07/15  2:41 AM  Result Value Ref Range   Troponin i, poc 0.01 0.00 - 0.08 ng/mL   Comment 3            Comment: Due to the release kinetics of cTnI, a negative result within the first hours of the onset of symptoms does not rule out myocardial infarction with certainty. If myocardial infarction is still suspected, repeat the test at appropriate intervals.   I-Stat Chem 8, ED  (not at [facility], [facility])     Status: Abnormal   Collection Time: 12/07/15  2:43 AM  Result Value Ref Range   Sodium 141 135 - 145 mmol/L   Potassium 3.2 (L) 3.5 - 5.1 mmol/L   Chloride 105 101 - 111 mmol/L   BUN 17 6 - 20 mg/dL   Creatinine, Ser 6.29 0.44 - 1.00 mg/dL   Glucose, Bld 528 (H) 65 - 99 mg/dL   Calcium,  Ion 1.08 (L) 1.13 - 1.30 mmol/L   TCO2 20 0 - 100 mmol/L   Hemoglobin 16.7 (H) 12.0 - 15.0 g/dL   HCT 16.1 (H) 09.6 - 04.5 %  CBG monitoring, ED     Status: Abnormal   Collection Time: 12/07/15  2:54 AM  Result Value Ref Range   Glucose-Capillary 192 (H) 65 - 99 mg/dL   Ct Head Wo Contrast  12/07/2015  CLINICAL DATA:  Acute onset of left-sided weakness and difficulty smiling. Code stroke. Initial encounter. EXAM: CT HEAD WITHOUT CONTRAST TECHNIQUE: Contiguous axial images were obtained from the base of the skull through the vertex without intravenous contrast. COMPARISON:  CT of the head performed 03/07/2015 FINDINGS: There is no evidence of acute infarction, mass lesion, or intra- or extra-axial hemorrhage on CT. A small chronic infarct is noted at the right external capsule. The posterior fossa, including the cerebellum, brainstem and fourth ventricle, is within normal limits. The third and lateral  ventricles are unremarkable in appearance. The cerebral hemispheres are symmetric in appearance, with normal gray-white differentiation. No mass effect or midline shift is seen. There is no evidence of fracture; visualized osseous structures are unremarkable in appearance. The visualized portions of the orbits are within normal limits. There is inspissated mucus within the right maxillary sinus. The remaining paranasal sinuses and mastoid air cells are well-aerated. No significant soft tissue abnormalities are seen. IMPRESSION: 1. No acute intracranial pathology seen on CT. 2. Small chronic infarct at the right external capsule. 3. Inspissated mucus within the right maxillary sinus. These results were called by telephone at the time of interpretation on 12/07/2015 at 2:50 am to Dr. Roseanne Reno, who verbally acknowledged these results. Electronically Signed   By: Roanna Raider M.D.   On: 12/07/2015 02:51    Assessment: 70 y.o. female with multiple risk factors for stroke as well as previous infarction, presenting with possible recurrent right subcortical ischemic cerebral infarction.  Stroke Risk Factors - diabetes mellitus and hypertension  Plan: 1. HgbA1c, fasting lipid panel 2. MRI, MRA  of the brain without contrast 3. PT consult, OT consult, Speech consult 4. Echocardiogram 5. Carotid dopplers 6. Prophylactic therapy-Antiplatelet med: Aspirin  7. Risk factor modification 8. Telemetry monitoring  C.R. Roseanne Reno, MD [facility] [phone]  12/07/2015, 3:07 AM

## 2015-12-07 NOTE — Progress Notes (Signed)
  Echocardiogram 2D Echocardiogram has been performed.  [name] [name] 12/07/2015, 9:15 AM

## 2015-12-07 NOTE — ED Notes (Signed)
Per [facility] pt last seen normal at 2200. Pt reports noticing symptoms at 2200. Left sided weakness and numbness. Pt has history of previous CVA  (affected left side but worse tonight) And thorastic AAA. SOB and chest pain started at 2200.   184/120 BP, 88 Hr, 100% nebulizer. 129CBG.   Alert and oriented. 8/10 pain. 20 RH

## 2015-12-07 NOTE — ED Notes (Signed)
Assisted up to the BSC without difficulty  

## 2015-12-07 NOTE — ED Notes (Signed)
Patient transported to X-ray 

## 2015-12-07 NOTE — Progress Notes (Signed)
Handover: 70 y/o female with pmh of DMII, CVA w/residual L. sided weakness, HTN, thoracic abdominal aortic aneurysm; who presents with complaints of left-sided weakness, chest pain, and SOB. Seen by Neruo. Not a TPA candidate and recommend completion of a stroke workup. Troponins neg, CTA neg for PE or acute dissection. Chest pain rule out. Admitted to telemetry floor.

## 2015-12-08 DIAGNOSIS — E118 Type 2 diabetes mellitus with unspecified complications: Secondary | ICD-10-CM | POA: Diagnosis not present

## 2015-12-08 DIAGNOSIS — M6289 Other specified disorders of muscle: Secondary | ICD-10-CM | POA: Diagnosis not present

## 2015-12-08 DIAGNOSIS — G8929 Other chronic pain: Secondary | ICD-10-CM

## 2015-12-08 DIAGNOSIS — R079 Chest pain, unspecified: Secondary | ICD-10-CM

## 2015-12-08 DIAGNOSIS — I5032 Chronic diastolic (congestive) heart failure: Secondary | ICD-10-CM | POA: Diagnosis not present

## 2015-12-08 LAB — BASIC METABOLIC PANEL
Anion gap: 12 (ref 5–15)
BUN: 20 mg/dL (ref 6–20)
CALCIUM: 9.1 mg/dL (ref 8.9–10.3)
CHLORIDE: 103 mmol/L (ref 101–111)
CO2: 24 mmol/L (ref 22–32)
CREATININE: 0.96 mg/dL (ref 0.44–1.00)
GFR calc Af Amer: >60 mL/min (ref >60)
GFR calc non Af Amer: 59 mL/min — ABNORMAL LOW (ref >60)
GLUCOSE: 156 mg/dL — AB (ref 65–99)
Potassium: 4.2 mmol/L (ref 3.5–5.1)
Sodium: 139 mmol/L (ref 135–145)

## 2015-12-08 LAB — T3: T3, Total: 107 ng/dL (ref 71–180)

## 2015-12-08 LAB — HEMOGLOBIN A1C
Hgb A1c MFr Bld: 8.2 % — ABNORMAL HIGH (ref 4.8–5.6)
Mean Plasma Glucose: 189 mg/dL

## 2015-12-08 LAB — LIPID PANEL
CHOL/HDL RATIO: 3.5 ratio
CHOLESTEROL: 141 mg/dL (ref 0–200)
HDL: 40 mg/dL — AB (ref >40)
LDL CALC: 85 mg/dL (ref 0–99)
TRIGLYCERIDES: 78 mg/dL (ref <150)
VLDL: 16 mg/dL (ref 0–40)

## 2015-12-08 LAB — GLUCOSE, CAPILLARY
Glucose-Capillary: 156 mg/dL — ABNORMAL HIGH (ref 65–99)
Glucose-Capillary: 139 mg/dL — ABNORMAL HIGH (ref 65–99)

## 2015-12-08 NOTE — Progress Notes (Signed)
Occupational Therapy Evaluation Patient Details Name: [name] [name] MRN: [id number] DOB: December 21, 1945 Today's Date: 12/08/2015    History of Present Illness [place] L [place] is an 70 y.o. female history of previous stroke, hypertension and diabetes mellitus, as well as ascending aortic aneurysm, presenting with new onset numbness and weakness of a left-sided upper extremity as well as left facial numbness and slurred speech.    Clinical Impression   PTA, pt was independent with all ADLs and mobility. Pt currently requires min guard assist for functional transfers and ADLs due to balance impairments, and L side weakness, and decreased peripheral vision. Pt plans to d/c home with intermittent assistance from family as needed. Pt will benefit from continued acute OT to increase independence and safety with ADLs and mobility to allow for safe discharge home. Recommending HHOT for LUE strengthening, low vision, and ADL retraining.. Also recommending 3in1 and RW.    Follow Up Recommendations  Home health OT;Supervision/Assistance - 24 hour    Equipment Recommendations  3 in 1 bedside comode;Tub/shower seat;Other (comment) (RW-2 wheeled)    Recommendations for Other Services       Precautions / Restrictions Precautions Precautions: Fall Restrictions Weight Bearing Restrictions: No      Mobility Bed Mobility               General bed mobility comments: Pt up in chair on OT arrival  Transfers Overall transfer level: Needs assistance Equipment used: None (IV pole) Transfers: Sit to/from Stand Sit to Stand: Min guard         General transfer comment: Min guard for safety and steadying. Pt with unsteady gait and advised to use RW.    Balance Overall balance assessment: Needs assistance Sitting-balance support: No upper extremity supported;Feet supported Sitting balance-Leahy Scale: Good     Standing balance support: No upper extremity supported;During functional  activity Standing balance-Leahy Scale: Poor Standing balance comment: Used IV pole and "furniture walked" around room and needed single extremity support most of the time for dynamic standing balance.                            ADL Overall ADL's : Needs assistance/impaired     Grooming: Wash/dry hands;Min Production designer, theatre/television/film: Min guard;Ambulation;BSC (holding onto IV pole)   Toileting- Clothing Manipulation and Hygiene: Min guard;Sit to/from stand       Functional mobility during ADLs: Min guard (holding onto IV pole) General ADL Comments: Pt with significant weakness in LUE - encouraged pt to use LUE during all ADLs. Educated pt on visual scanning strategy due to significant peripheral visual field loss. Daughter present for session.     Vision Vision Assessment?: Yes Eye Alignment: Within Functional Limits Ocular Range of Motion: Within Functional Limits Alignment/Gaze Preference: Within Defined Limits Tracking/Visual Pursuits: Able to track stimulus in all quads without difficulty Saccades: Within functional limits Convergence: Within functional limits Visual Fields: Other (comment) (Bilateral peripheral field loss of about ~75 degrees) Depth Perception: Overshoots (with LUE) Additional Comments: Pt describes seeing floaters. Pt had 75 degree peripheral vision loss on both sides and reports that it is extremely blurry that she is unable to see in her periphery. Pt also cannot see at night and no longer drives at night.    Perception     Praxis      Pertinent Vitals/Pain Pain Assessment:  0-10 Pain Score: 9  Pain Location: headache Pain Descriptors / Indicators: Headache Pain Intervention(s): Monitored during session     Hand Dominance Right   Extremity/Trunk Assessment Upper Extremity Assessment Upper Extremity Assessment: LUE deficits/detail LUE Deficits / Details: STRENGTH: hand, wrist, elbow 3/5, unable to test  shoulder due to pain; ROM: shoulder 100 degeres initially with severe pain - able to reach full range gently still with pain; SENSATION: impaired light touch localization LUE: Unable to fully assess due to pain LUE Sensation: decreased light touch LUE Coordination: decreased fine motor;decreased gross motor   Lower Extremity Assessment Lower Extremity Assessment: Defer to PT evaluation   Cervical / Trunk Assessment Cervical / Trunk Assessment: Normal   Communication Communication Communication: No difficulties   Cognition Arousal/Alertness: Awake/alert Behavior During Therapy: WFL for tasks assessed/performed Overall Cognitive Status: Within Functional Limits for tasks assessed                     General Comments       Exercises       Shoulder Instructions      Home Living Family/patient expects to be discharged to:: Private residence Living Arrangements: Alone Available Help at Discharge: Available PRN/intermittently;Family Type of Home: House Home Access: Stairs to enter [employer] of Steps: 1 big step Entrance Stairs-Rails: None Home Layout: One level     Bathroom Shower/Tub: Tub/shower unit;Curtain Shower/tub characteristics: Engineer, building services: Standard     Home Equipment: Cane - single point;Hand held shower head   Additional Comments: Son lives next door and does not work during the day. Daughter works 2 days/week  Lives With: Alone    Prior Functioning/Environment Level of Independence: Independent        Comments: gardening, used to like to go bowling    OT Diagnosis: Hemiplegia non-dominant side;Disturbance of vision;Acute pain   OT Problem List: Decreased strength;Decreased range of motion;Decreased activity tolerance;Impaired balance (sitting and/or standing);Impaired vision/perception;Decreased coordination;Decreased safety awareness;Decreased knowledge of use of DME or AE;Obesity;Pain;Impaired UE functional use   OT  Treatment/Interventions: Self-care/ADL training;Therapeutic exercise;Energy conservation;DME and/or AE instruction;Therapeutic activities;Visual/perceptual remediation/compensation;Patient/family education;Balance training    OT Goals(Current goals can be found in the care plan section) Acute Rehab OT Goals Patient Stated Goal: to go home OT Goal Formulation: With patient Time For Goal Achievement: 12/22/15 Potential to Achieve Goals: Good ADL Goals Pt Will Perform Upper Body Bathing: with modified independence;standing Pt Will Perform Lower Body Bathing: with modified independence;sit to/from stand Pt Will Transfer to Toilet: with modified independence;ambulating;bedside commode Pt Will Perform Toileting - Clothing Manipulation and hygiene: with modified independence;sitting/lateral leans;sit to/from stand Pt Will Perform Tub/Shower Transfer: Tub transfer;with supervision;ambulating;rolling walker Pt/caregiver will Perform Home Exercise Program: Increased ROM;Increased strength;Left upper extremity;Independently;With theraband;With theraputty;With written HEP provided  OT Frequency: Min 2X/week   Barriers to D/C: Decreased caregiver support  Family available intermittently - still unclear if able to provide 24/7 assistance even after speaking with daughter       Co-evaluation              End of Session Equipment Utilized During Treatment: Gait belt Nurse Communication: Mobility status  Activity Tolerance: Patient tolerated treatment well Patient left: in chair;with call bell/phone within reach;with chair alarm set;with family/visitor present   Time: 1610-9604 OT Time Calculation (min): 27 min Charges:  OT General Charges $OT Visit: 1 Procedure OT Evaluation $OT Eval Moderate Complexity: 1 Procedure OT Treatments $Self Care/Home Management : 8-22 mins G-Codes: OT G-codes **NOT FOR INPATIENT CLASS** Functional  Assessment Tool Used: clinical judgement Functional  Limitation: Self care Self Care Current Status [phone]): At least 1 percent but less than 20 percent impaired, limited or restricted Self Care Goal Status (U0454): 0 percent impaired, limited or restricted  Nils Pyle, OTR/L Pager: [phone] 12/08/2015, 2:06 PM

## 2015-12-08 NOTE — Evaluation (Signed)
Speech Language Pathology Evaluation Patient Details Name: [name] [name] MRN: [id number] DOB: 1946-08-21 Today's Date: 12/08/2015 Time: 4098-1191 SLP Time Calculation (min) (ACUTE ONLY): 10 min  Problem List:  Patient Active Problem List   Diagnosis Date Noted  . Chronic diastolic heart failure, NYHA class 2 (HCC) 12/07/2015  . Pulmonary HTN (HCC) 12/07/2015  . Chronic chest pain 12/07/2015  . Thoracic aortic aneurysm (HCC) 12/07/2015  . Left sided numbness 12/07/2015  . Diabetes mellitus with complication (HCC)   . Bradycardia 05/06/2012  . Hypokalemia 05/06/2012  . Acute left-sided weakness 05/05/2012  . CVA (cerebral vascular accident) (HCC) 05/05/2012  . Chest pain 05/05/2012  . Obesity (BMI 35.0-39.9 without comorbidity) (HCC) 04/25/2012  . Cystocele 03/22/2012  . Diabetes mellitus, type 2 (HCC) 03/22/2012  . Hypertension 03/22/2012  . gerd 03/22/2012   Past Medical History:  Past Medical History  Diagnosis Date  . Hypertension   . Diabetes mellitus   . Obesity (BMI 35.0-39.9 without comorbidity) (HCC)   . GERD (gastroesophageal reflux disease)   . Chest pain 2009    MV with no scar or ischemia, EF 65%  . History of cardiac catheterization     a. Myoview 2/15 with anteroapical ischemia, EF 64% (intermediate risk) >> LHC - normal cos, EF 55-60%  . Thoracic ascending aortic aneurysm (HCC)     a. CT 1/17: ascending aorta 4.2 x 4.0 cm  . Pneumonia    Past Surgical History:  Past Surgical History  Procedure Laterality Date  . Wisdom tooth extraction    . Cholecystectomy    . Temporomandibular joint surgery    . Abdominal hysterectomy  2006    Partial, one ovary left   . Cardiac catheterization  2004    [place], [place] per pt.  . Left heart catheterization with coronary angiogram N/A 01/09/2014    Procedure: LEFT HEART CATHETERIZATION WITH CORONARY ANGIOGRAM;  Surgeon: Marykay Lex, MD;  Location: [facility] CATH LAB;  Service: Cardiovascular;  Laterality: N/A;    HPI:  [name] [name] is a 70 y.o. female with a Past Medical History of HTN, DM, GERD, CP, cardiac catheterization without occlusive disease, T AAA at 4.2 cm who presents with strokelike symptoms MRI negative for acute changes    Assessment / Plan / Recommendation Clinical Impression   Pt presents with grossly intact cognitive function for basic, daily tasks but presents with delayed processing speed and questionable higher level executive function deficits for higher level tasks, although formal assessment was limited on this date due to lethargy.  Pt's speech was fluent and free from dysarthria or word finding impairement.  Given that pt lived independently prior to admission and was managing her own medications and finances, recommend additional ST follow up for more formalized cognitive assessment of higher level medication and financial management tasks prior to discharge.      SLP Assessment  Patient needs continued Speech Lanaguage Pathology Services    Follow Up Recommendations  Other (comment) (TBD)    Frequency and Duration min 1 x/week  1 week      SLP Evaluation Prior Functioning  Cognitive/Linguistic Baseline: Within functional limits Type of Home: House  Lives With: Alone Available Help at Discharge: Available PRN/intermittently;Family Vocation: Retired   IT consultant  Overall Cognitive Status: Difficult to assess Arousal/Alertness: Lethargic Orientation Level: Oriented X4 Attention: Sustained Sustained Attention: Appears intact Memory: Appears intact (for basic daily information ) Awareness: Appears intact Behaviors: Other (comment) (delayed processing speed ) Safety/Judgment: Appears intact  Comprehension  Auditory Comprehension Overall Auditory Comprehension: Appears within functional limits for tasks assessed    Expression Expression Primary Mode of Expression: Verbal Verbal Expression Overall Verbal Expression: Appears within functional limits for  tasks assessed Written Expression Dominant Hand: Right   Oral / Motor  Oral Motor/Sensory Function Overall Oral Motor/Sensory Function: Within functional limits Motor Speech Overall Motor Speech: Appears within functional limits for tasks assessed   GO          Functional Assessment Tool Used: cognitive linguistic evaluation completed Functional Limitations: Memory Memory Current Status (Z6109): At least 20 percent but less than 40 percent impaired, limited or restricted Memory Goal Status (U0454): At least 1 percent but less than 20 percent impaired, limited or restricted         Jackalyn Lombard L 12/08/2015, 9:15 AM

## 2015-12-08 NOTE — Progress Notes (Addendum)
[facility]                                                                              Patient Demographics  [name] [name], is a 70 y.o. female, DOB - 1946/01/02, ZOX:[id number]  Admit date - 12/07/2015   Admitting Physician Ozella Rocks, MD  Outpatient Primary MD for the patient is Verlon Au, MD  LOS -    Chief Complaint  Patient presents with  . Code Stroke       Brief HPI   [name] [name] is a 70 y.o. female with a Past Medical History of HTN, DM, GERD, CP, cardiac catheterization without occlusive disease, T AAA at 4.2 cm who presented with chest discomfort, left-sided weakness. Patient had reported sudden onset of left upper extremity weakness, tingling, facial tingling that began 4 hours prior to presentation. She also reported sharp midsternal chest pain or shortness of breath. CT head in ED showed no acute CVA. CT chest and aorta showed no aortic dissection or definitive central pulmonary artery embolism. Patient was admitted for further workup.   Assessment & Plan     Principal Problem:  Acute left-sided weakness/history of CVA with prior residual left-sided deficit likely TIA - Neurology was consulted - MRI brain showed no acute intracranial abnormalities - MRA showed normal intracranial circulation  - 2-D echo showed EF of 60-65% with grade 1 diastolic dysfunction - Carotid Dopplers pending - Neurology recommended aspirin 325 mg daily - Lipid panel showed LDL 85, goal less than 70, placed on statin - Hemoglobin A1c 8.2 - PT evaluation recommending home health PT   Active Problems:  Hypokalemia -Recheck BMET, likely due to HCTZ use  Thoracic aneurysm -Incidental finding on ER CTA of chest on 1/5however CT angiogram of the chest with and aorta protocol showed mildly tortuous thoracic aorta otherwise unremarkable, no mention of ascending aortic aneurysm  -Cardiology following and had ordered outpatient echo. 2-D echo done  this admission showed normal aortic root and ascending aorta, mildly dilated.   - outpatient [facility] referral/ evaluation deferred to PCP    Chronic chest pain -Documented as having a chronic component based on outpatient cardiology evaluation on 1/11 as well as a focal area of reproducible anterior chest wall pain -Troponins negative, 2-D echo with preserved EF, no regional wall motion abnormalities. EKG showed no acute ST-T wave changes. - Prior NM stress February 2015 was suggestive of anterior apical ischemia but left heart cath demonstrated normal coronary arteries -Recently treated as an outpatient with doxycycline for presumed pneumonia/bronchitis, possibly esophagitis    Diabetes mellitus, type 2  -Continue preadmission Glucotrol -SSI -Hgb A1c   Hypertension -Moderate controlled -Continue preadmission lisinopril with HCTZ, Toprol-XL   Obesity (BMI 35.0-39.9 without comorbidity) -Patient counseled on diet and weight control  Chronic diastolic dysfunction -Chest x-ray questioning some mild vascular congestion, chronic dyspnea on exertion. - 2-D echo showed EF 60-65% with grade 1 diastolic dysfunction.  - currently stable  Acute on chronic right maxillary sinusitis -Currently asymptomatic -Recently completed doxycycline as an outpatient this past Sunday  Code Status:full code  Family Communication: Discussed in detail with the patient, all  imaging results, lab results explained to the patient   Disposition Plan:   Time Spent in minutes  25* minutes  Procedures MRI, MRA, 2-D echo  Consults   Neurology  DVT Prophylaxis  Lovenox   Medications  Scheduled Meds: . aspirin  324 mg Oral Once  . aspirin  300 mg Rectal Daily   Or  . aspirin  325 mg Oral Daily  . enoxaparin (LOVENOX) injection  40 mg Subcutaneous Q24H  . glipiZIDE  5 mg Oral BID AC  . hydrochlorothiazide  12.5 mg Oral Daily  . insulin aspart  0-5 Units Subcutaneous QHS  . insulin aspart  0-9 Units  Subcutaneous TID WC  . lisinopril  10 mg Oral Daily  . metoprolol succinate  50 mg Oral Daily  . pantoprazole  40 mg Oral Q1200  . potassium chloride SA  20 mEq Oral Daily  . potassium chloride  40 mEq Oral Once   Continuous Infusions: . sodium chloride 75 mL/hr at 12/07/15 1757   PRN Meds:.acetaminophen, albuterol   Antibiotics   Anti-infectives    None        Subjective:   [name] Scinto was seen and examined today.  Patient denies dizziness, chest pain, shortness of breath, abdominal pain, N/V/D/C, new weakness, numbess, tingling. No acute events overnight.    Objective:   Blood pressure 138/78, pulse 67, temperature 98.3 F (36.8 C), temperature source Oral, resp. rate 15, height  (1.6 m), weight 87.544 kg (193 lb), SpO2 95 %.  Wt Readings from Last 3 Encounters:  12/07/15 87.544 kg (193 lb)  12/02/15 86.637 kg (191 lb)  03/07/15 91.23 kg (201 lb 2 oz)    No intake or output data in the 24 hours ending 12/08/15 1301  Exam  General: Alert and oriented x 3, NAD  HEENT:  PERRLA, EOMI, Anicteric Sclera, mucous membranes moist.   Neck: Supple, no JVD, no masses  CVS: S1 S2 auscultated, no rubs, murmurs or gallops. Regular rate and rhythm.  Respiratory: Clear to auscultation bilaterally, no wheezing, rales or rhonchi  Abdomen: Soft, nontender, nondistended, + bowel sounds  Ext: no cyanosis clubbing or edema  Neuro: AAOx3, Cr N's II- XII. Strength 5/5 upper and lower extremities on the right, on the left 4/5 upper and lower extremities  Skin: No rashes  Psych: Normal affect and demeanor, alert and oriented x3    Data Review   Micro Results No results found for this or any previous visit (from the past 240 hour(s)).  Radiology Reports Dg Chest 2 View  12/07/2015  CLINICAL DATA:  Acute onset of centralized chest pain and shortness of breath. Initial encounter. EXAM: CHEST  2 VIEW COMPARISON:  Chest radiograph and CTA of the chest performed  11/25/2015 FINDINGS: The lungs are mildly hypoexpanded. Vascular crowding and mild vascular congestion are noted. There is mild elevation of the right hemidiaphragm. Increased interstitial markings could reflect minimal interstitial edema or mild pneumonia. There is no evidence of pleural effusion or pneumothorax. The heart is mildly enlarged. No acute osseous abnormalities are seen. Clips are noted within the right upper quadrant, reflecting prior cholecystectomy. IMPRESSION: Lungs mildly hypoexpanded. Mild vascular congestion and mild cardiomegaly noted. Mild elevation of the right hemidiaphragm. Increased interstitial markings could reflect minimal interstitial edema or mild pneumonia. Electronically Signed   By: Roanna Raider M.D.   On: 12/07/2015 03:44   Ct Head Wo Contrast  12/07/2015  CLINICAL DATA:  Acute onset of left-sided weakness and difficulty smiling. Code stroke.  Initial encounter. EXAM: CT HEAD WITHOUT CONTRAST TECHNIQUE: Contiguous axial images were obtained from the base of the skull through the vertex without intravenous contrast. COMPARISON:  CT of the head performed 03/07/2015 FINDINGS: There is no evidence of acute infarction, mass lesion, or intra- or extra-axial hemorrhage on CT. A small chronic infarct is noted at the right external capsule. The posterior fossa, including the cerebellum, brainstem and fourth ventricle, is within normal limits. The third and lateral ventricles are unremarkable in appearance. The cerebral hemispheres are symmetric in appearance, with normal gray-white differentiation. No mass effect or midline shift is seen. There is no evidence of fracture; visualized osseous structures are unremarkable in appearance. The visualized portions of the orbits are within normal limits. There is inspissated mucus within the right maxillary sinus. The remaining paranasal sinuses and mastoid air cells are well-aerated. No significant soft tissue abnormalities are seen.  IMPRESSION: 1. No acute intracranial pathology seen on CT. 2. Small chronic infarct at the right external capsule. 3. Inspissated mucus within the right maxillary sinus. These results were called by telephone at the time of interpretation on 12/07/2015 at 2:50 am to Dr. Roseanne Reno, who verbally acknowledged these results. Electronically Signed   By: Roanna Raider M.D.   On: 12/07/2015 02:51   Ct Angio Chest Pe W/cm &/or Wo Cm  11/25/2015  CLINICAL DATA:  Shortness of breath and chest pain for 10 days. Progressed past 2 days EXAM: CT ANGIOGRAPHY CHEST WITH CONTRAST TECHNIQUE: Multidetector CT imaging of the chest was performed using the standard protocol during bolus administration of intravenous contrast. Multiplanar CT image reconstructions and MIPs were obtained to evaluate the vascular anatomy. CONTRAST:  70mL OMNIPAQUE IOHEXOL 350 MG/ML SOLN COMPARISON:  Chest radiograph November 25, 2015 FINDINGS: There is no demonstrable pulmonary embolus. There is tortuosity in the thoracic aorta. The at ascending thoracic aorta is prominent, measuring 4.2 x 4.0 cm. There is no thoracic aortic dissection. The visualized great vessels appear unremarkable. There is patchy bibasilar atelectatic change with mild consolidation in the posterior right base. Lungs elsewhere clear. The visualized thyroid appears unremarkable. There is no appreciable thoracic adenopathy. The pericardium is not thickened. In the visualized upper abdomen, the gallbladder is absent. The visualized upper abdominal structures otherwise appear unremarkable. There are no blastic or lytic bone lesions. There are scattered foci of degenerative change in the thoracic spine. There is asymmetric density in the mid right breast compared to the left, best seen on axial slice 60 series 401. Review of the MIP images confirms the above findings. IMPRESSION: No demonstrable pulmonary embolus. Patchy bibasilar atelectasis with mild consolidation posterior right base.  Prominence of the ascending thoracic aorta measuring 4.2 x 4.0 cm. Recommend annual imaging followup by CTA or MRA. This recommendation follows 2010 ACCF/AHA/AATS/ACR/ASA/SCA/SCAI/SIR/STS/SVM Guidelines for the Diagnosis and Management of Patients with Thoracic Aortic Disease. Circulation. 2010; 121: Z610-R604 No demonstrable adenopathy.  Gallbladder absent. Asymmetric density midportion right breast compared to the left. Significance of this finding is uncertain. This finding may warrant mammographic correlation if mammography has not been performed recently. Electronically Signed   By: Bretta Bang III M.D.   On: 11/25/2015 14:23   Mr Shirlee Latch Wo Contrast  12/07/2015  CLINICAL DATA:  70 year old female with previous stroke resulting in left side weakness. Complains of increased left side weakness, chest pain, shortness of breath. Initial encounter. EXAM: MRI HEAD WITHOUT CONTRAST MRA HEAD WITHOUT CONTRAST TECHNIQUE: Multiplanar, multiecho pulse sequences of the brain and surrounding structures were obtained without intravenous  contrast. Angiographic images of the head were obtained using MRA technique without contrast. COMPARISON:  Head CT without contrast 0242 hours today. Brain MRI and MRA 07/01/2012. FINDINGS: MRI HEAD FINDINGS Major intracranial vascular flow voids are stable. No restricted diffusion to suggest acute infarction. No midline shift, mass effect, evidence of mass lesion, ventriculomegaly, extra-axial collection or acute intracranial hemorrhage. Cervicomedullary junction and pituitary are within normal limits. Negative visualized cervical spine. Cerebral volume is not significantly changed since 2013. Mild for age scattered and small foci of nonspecific cerebral white matter T2 and FLAIR hyperintensity have not significantly changed. No cortical encephalomalacia. There is a small cluster of chronic micro hemorrhage in the anterior right corona radiata (series 5, image 63) which is more  apparent today owing to susceptibility weighted imaging. No other chronic cerebral blood products. Deep gray matter nuclei and brainstem are within normal limits. There is a tiny chronic lacune in the right cerebellum which is new (series 7, image 8). The cerebellum otherwise appears normal. Visible internal auditory structures appear normal. Mastoids are clear. Chronic right maxillary sinusitis with superimposed fluid level today. Mild ethmoid sinus mucosal disease is stable. Small volume retained secretions in the nasopharynx. Orbit and scalp soft tissues are within normal limits. Normal bone marrow signal. MRA HEAD FINDINGS Stable antegrade flow in the posterior circulation with mildly dominant distal left vertebral artery. PICA origins remain patent. Basilar artery remains patent without stenosis. Mild ectasia at the basilar artery tip appears stable. PCA origins remain normal. Posterior communicating arteries are diminutive or absent. Bilateral PCA branches are stable and within normal limits. Stable antegrade flow in both ICA siphons. Mild siphon irregularity in keeping with atherosclerosis, exacerbated by motion artifact on today study. No siphon stenosis identified. Stable carotid termini. Dominant left ACA A1 segment re- demonstrated with diminutive or absent right A1. Left ACA and MCA origins are normal. Anterior communicating artery and visualized ACA branches are stable, the left ACA remains dominant. MCA M1 segments and bifurcations are stable and within normal limits. Visualized MCA branches are stable and within normal limits. IMPRESSION: 1.  No acute intracranial abnormality. 2. Mild for age chronic small vessel disease, not significantly changed since 2013. 3. Stable and negative for age intracranial MRA. 4. Acute on chronic right maxillary sinusitis. Electronically Signed   By: Odessa Fleming M.D.   On: 12/07/2015 08:49   Mr Brain Wo Contrast  12/07/2015  CLINICAL DATA:  70 year old female with  previous stroke resulting in left side weakness. Complains of increased left side weakness, chest pain, shortness of breath. Initial encounter. EXAM: MRI HEAD WITHOUT CONTRAST MRA HEAD WITHOUT CONTRAST TECHNIQUE: Multiplanar, multiecho pulse sequences of the brain and surrounding structures were obtained without intravenous contrast. Angiographic images of the head were obtained using MRA technique without contrast. COMPARISON:  Head CT without contrast 0242 hours today. Brain MRI and MRA 07/01/2012. FINDINGS: MRI HEAD FINDINGS Major intracranial vascular flow voids are stable. No restricted diffusion to suggest acute infarction. No midline shift, mass effect, evidence of mass lesion, ventriculomegaly, extra-axial collection or acute intracranial hemorrhage. Cervicomedullary junction and pituitary are within normal limits. Negative visualized cervical spine. Cerebral volume is not significantly changed since 2013. Mild for age scattered and small foci of nonspecific cerebral white matter T2 and FLAIR hyperintensity have not significantly changed. No cortical encephalomalacia. There is a small cluster of chronic micro hemorrhage in the anterior right corona radiata (series 5, image 63) which is more apparent today owing to susceptibility weighted imaging. No other chronic  cerebral blood products. Deep gray matter nuclei and brainstem are within normal limits. There is a tiny chronic lacune in the right cerebellum which is new (series 7, image 8). The cerebellum otherwise appears normal. Visible internal auditory structures appear normal. Mastoids are clear. Chronic right maxillary sinusitis with superimposed fluid level today. Mild ethmoid sinus mucosal disease is stable. Small volume retained secretions in the nasopharynx. Orbit and scalp soft tissues are within normal limits. Normal bone marrow signal. MRA HEAD FINDINGS Stable antegrade flow in the posterior circulation with mildly dominant distal left vertebral  artery. PICA origins remain patent. Basilar artery remains patent without stenosis. Mild ectasia at the basilar artery tip appears stable. PCA origins remain normal. Posterior communicating arteries are diminutive or absent. Bilateral PCA branches are stable and within normal limits. Stable antegrade flow in both ICA siphons. Mild siphon irregularity in keeping with atherosclerosis, exacerbated by motion artifact on today study. No siphon stenosis identified. Stable carotid termini. Dominant left ACA A1 segment re- demonstrated with diminutive or absent right A1. Left ACA and MCA origins are normal. Anterior communicating artery and visualized ACA branches are stable, the left ACA remains dominant. MCA M1 segments and bifurcations are stable and within normal limits. Visualized MCA branches are stable and within normal limits. IMPRESSION: 1.  No acute intracranial abnormality. 2. Mild for age chronic small vessel disease, not significantly changed since 2013. 3. Stable and negative for age intracranial MRA. 4. Acute on chronic right maxillary sinusitis. Electronically Signed   By: Odessa Fleming M.D.   On: 12/07/2015 08:49   Dg Shoulder Left  12/07/2015  CLINICAL DATA:  Fall in 2015 with pain since that time. Her pain has increased over the last month. EXAM: LEFT SHOULDER - 2+ VIEW COMPARISON:  None. FINDINGS: No fracture or dislocation. Mild AC joint degenerative changes. Limited views of left chest are normal. No acute abnormalities or cause for the patient's pain identified. IMPRESSION: No cause for the patient's pain identified. Electronically Signed   By: Gerome Sam III M.D   On: 12/07/2015 10:54   Dg Abd Acute W/chest  11/25/2015  CLINICAL DATA:  Acute onset of left lower chest pain and left upper quadrant abdominal pain. Vomiting and cough. Initial encounter. EXAM: DG ABDOMEN ACUTE W/ 1V CHEST COMPARISON:  Chest radiograph performed 01/06/2014, MRI of the abdomen performed 04/28/2009 FINDINGS: The lungs  are well-aerated. Minimal bibasilar atelectasis is noted. There is no evidence of pleural effusion or pneumothorax. The cardiomediastinal silhouette is borderline normal in size. The visualized bowel gas pattern is unremarkable. Scattered stool and air are seen within the colon; there is no evidence of small bowel dilatation to suggest obstruction. No free intra-abdominal air is identified on the provided upright view. Clips are noted within the right upper quadrant, reflecting prior cholecystectomy. No acute osseous abnormalities are seen; the sacroiliac joints are unremarkable in appearance. IMPRESSION: 1. Unremarkable bowel gas pattern; no free intra-abdominal air seen. Small to moderate amount of stool noted in the colon. 2. Minimal bibasilar atelectasis noted.  Lungs otherwise clear. Electronically Signed   By: Roanna Raider M.D.   On: 11/25/2015 06:54   Ct Angio Chest Aorta W/cm &/or Wo/cm  12/07/2015  CLINICAL DATA:  70 year old female with chest pain and shortness of breath EXAM: CT ANGIOGRAPHY CHEST WITH CONTRAST TECHNIQUE: Multidetector CT imaging of the chest was performed using the standard protocol during bolus administration of intravenous contrast. Multiplanar CT image reconstructions and MIPs were obtained to evaluate the vascular anatomy. CONTRAST:  OMNIPAQUE IOHEXOL 350 MG/ML SOLN COMPARISON:  Chest radiograph dated 12/07/2015 and CT dated 11/25/2015 FINDINGS: Bibasilar linear densities and ground-glass opacities most compatible with atelectatic changes. No focal consolidation, pleural effusion, or pneumothorax. The central airways are patent. The thoracic aorta is mildly tortuous otherwise unremarkable. Evaluation of the pulmonary artery is limited due to suboptimal opacification and timing of the contrast. No definite central pulmonary artery embolus identified. Mild cardiomegaly. No pericardial effusion. There is no hilar or mediastinal adenopathy. The esophagus is grossly  unremarkable. Subcentimeter left thyroid hypodense nodule. There is no axillary adenopathy. The chest wall soft tissues appear unremarkable. There is degenerative changes of the spine. No acute fracture. Constipation. Diverticulosis. Cholecystectomy. The visualized upper abdomen is otherwise unremarkable. Review of the MIP images confirms the above findings. IMPRESSION: No CT evidence of aortic dissection. No definite central pulmonary artery embolus. Electronically Signed   By: Elgie Collard M.D.   On: 12/07/2015 05:43   Ct Renal Stone Study  11/25/2015  CLINICAL DATA:  Pt having left flank pain since yesterday. Pt doesn't have any other complaints. EXAM: CT ABDOMEN AND PELVIS WITHOUT CONTRAST TECHNIQUE: Multidetector CT imaging of the abdomen and pelvis was performed following the standard protocol without IV contrast. COMPARISON:  None. FINDINGS: Lower chest: Minimal scarring at both lung bases. Heart size is normal. No imaged pericardial effusion or significant coronary artery calcifications. Upper abdomen: No focal abnormality identified within the liver, spleen, pancreas, or adrenal glands. Kidneys are symmetric in appearance. No hydronephrosis or intrarenal calculi. Gallbladder is surgically absent. Gastrointestinal tract: Stomach and small bowel loops are normal in appearance. The appendix is well seen and has a normal appearance. There is moderate colonic diverticulosis without associated inflammation. Pelvis: Status post hysterectomy. Retroperitoneum: There is moderate atherosclerosis of the abdominal aorta. No aneurysm. No retroperitoneal or mesenteric adenopathy. Abdominal wall: Unremarkable. Osseous structures: Degenerative changes are seen in the lower thoracic and lumbar spine. No suspicious lytic or blastic lesions are identified. IMPRESSION: 1. Colonic diverticulosis.  No acute diverticulitis. 2. No renal or ureteral stones. 3. Status post cholecystectomy aunts hysterectomy. 4. No  retroperitoneal adenopathy. Electronically Signed   By: Norva Pavlov M.D.   On: 11/25/2015 09:21    CBC  Recent Labs Lab 12/07/15 0233 12/07/15 0243 12/07/15 0926  WBC 7.5  --  4.1  HGB 14.9 16.7* 14.3  HCT 44.1 49.0* 42.4  PLT 290  --  269  MCV 89.5  --  90.4  MCH 30.2  --  30.5  MCHC 33.8  --  33.7  RDW 14.6  --  14.9  LYMPHSABS 5.0*  --  0.5*  MONOABS 0.6  --  0.1  EOSABS 0.1  --  0.0  BASOSABS 0.1  --  0.0    Chemistries   Recent Labs Lab 12/02/15 1330 12/07/15 0233 12/07/15 0243  NA 136 141 141  K 3.2* 3.4* 3.2*  CL 98 104 105  CO2 29 21*  --   GLUCOSE 125* 162* 159*  BUN CREATININE 0.90 1.05* 0.80  CALCIUM 9.6 10.0  --   AST  --  17  --   ALT  --  11*  --   ALKPHOS  --  92  --   BILITOT  --  0.6  --    ------------------------------------------------------------------------------------------------------------------ estimated creatinine clearance is 68.6 mL/min (by C-G formula based on Cr of 0.8). ------------------------------------------------------------------------------------------------------------------  Recent Labs  12/07/15 0233  HGBA1C 8.2*   ------------------------------------------------------------------------------------------------------------------  Recent Labs  12/08/15 0600  CHOL 141  HDL 40*  LDLCALC 85  TRIG 78  CHOLHDL 3.5   ------------------------------------------------------------------------------------------------------------------  Recent Labs  12/07/15 0926  TSH 0.322*   ------------------------------------------------------------------------------------------------------------------ No results for input(s): VITAMINB12, FOLATE, FERRITIN, TIBC, IRON, RETICCTPCT in the last 72 hours.  Coagulation profile  Recent Labs Lab 12/07/15 0233  INR 1.06    No results for input(s): DDIMER in the last 72 hours.  Cardiac Enzymes  Recent Labs Lab 12/07/15 0926 12/07/15 1535  TROPONINI <0.03  <0.03   ------------------------------------------------------------------------------------------------------------------ Invalid input(s): POCBNP   Recent Labs  12/07/15 0945 12/07/15 1152 12/07/15 1616 12/07/15 1801 12/07/15 2243 12/08/15 0621  GLUCAP 311* 257* 156* 150* 140* 156*     Dacie Mandel M.D. [facility] 12/08/2015, 1:01 PM  Pager: [phone] Between 7am to 7pm - call Pager - [phone]  After 7pm go to [email or web address] - password [email or web address]  Call night coverage person covering after 7pm

## 2015-12-08 NOTE — Evaluation (Signed)
Physical Therapy Evaluation Patient Details Name: [name] [name] MRN: [id number] DOB: Apr 15, 1946 Today's Date: 12/08/2015   History of Present Illness  [name] [name] is an 70 y.o. female history of previous stroke, hypertension and diabetes mellitus, as well as ascending aortic aneurysm, presenting with new onset numbness and weakness of a left-sided upper extremity as well as left facial numbness and slurred speech.   Clinical Impression  Pt admitted with above diagnosis. Pt currently with functional limitations due to the deficits listed below (see PT Problem List).  Pt will benefit from skilled PT to increase their independence and safety with mobility to allow discharge to the venue listed below.  Pt with hx of L sided weakness, but presents with L LE instability during gait, but hard to determine if this is pre-morbid or new. Pt feels she normally walks better than she did at eval.  Discussed with pt to have family stay with her for a while til she gets stronger (or pt to go to their house).  She stated she thinks that can work, but will have to talk with them.  Pt did state she has started trying to get into senior housing/Section 8 and this would be a good option in future as she states she has 1 large step to enter her home.  Recommend RW and 3-1 BSC, as well as HHPT and 24 hour S initially.     Follow Up Recommendations Home health PT;Supervision for mobility/OOB;Supervision/Assistance - 24 hour    Equipment Recommendations  Rolling walker with 5" wheels;3in1 (PT)    Recommendations for Other Services       Precautions / Restrictions Precautions Precautions: Fall      Mobility  Bed Mobility Overal bed mobility: Needs Assistance Bed Mobility: Supine to Sit     Supine to sit: Supervision;HOB elevated     General bed mobility comments: HOB slightly elevated to simulate bed at home. Heavy use of rail, but no physical A required, but increased time  Transfers Overall  transfer level: Needs assistance Equipment used: None Transfers: Sit to/from Stand Sit to Stand: Min guard         General transfer comment: min/guard for steadyinh  Ambulation/Gait Ambulation/Gait assistance: Min guard;Min assist Ambulation Distance (Feet): 140 Feet Assistive device: Rolling walker (2 wheeled) (IV pole) Gait Pattern/deviations: Decreased stride length;Decreased dorsiflexion - left     General Gait Details: Increased L knee flexion during stance phase. Amb 28' with RW and 100' with IV pole.  With IV pole, pt had one episode of LOB with MIN A recovery. Pt feels steadier with RW, although noted to have increased L knee flexion during stance while using RW.  Stairs            Wheelchair Mobility    Modified Rankin (Stroke Patients Only) Modified Rankin (Stroke Patients Only) Pre-Morbid Rankin Score: Slight disability Modified Rankin: Moderately severe disability     Balance Overall balance assessment: Needs assistance Sitting-balance support: Feet supported Sitting balance-Leahy Scale: Good       Standing balance-Leahy Scale: Poor Standing balance comment: requires UE support                             Pertinent Vitals/Pain Pain Assessment: 0-10 Pain Score: 9  Pain Location: headache Pain Descriptors / Indicators: Aching Pain Intervention(s): Patient requesting pain meds-RN notified;Monitored during session    Home Living Family/patient expects to be discharged to:: Private residence Living Arrangements:  Alone Available Help at Discharge: Available PRN/intermittently;Family Type of Home: House Home Access: Stairs to enter   [employer] of Steps: 1 big step Home Layout: One level Home Equipment: Cane - single point      Prior Function Level of Independence: Independent         Comments: gardening, used to like to go bowling     Hand Dominance   Dominant Hand: Right    Extremity/Trunk Assessment    Upper Extremity Assessment: Defer to OT evaluation           Lower Extremity Assessment: RLE deficits/detail;LLE deficits/detail RLE Deficits / Details: R LE 3+ to 4-/5 LLE Deficits / Details: L hip flex 3/5, L quads 3+/5, L ankle df 3/5-  needed tactile cueing to complete full range of motion, but once shown, was able to reproduce  Cervical / Trunk Assessment: Normal  Communication   Communication: No difficulties  Cognition Arousal/Alertness: Awake/alert Behavior During Therapy: Flat affect Overall Cognitive Status: No family/caregiver present to determine baseline cognitive functioning Area of Impairment: Problem solving;Following commands       Following Commands: Follows multi-step commands with increased time     Problem Solving: Decreased initiation;Slow processing      General Comments      Exercises General Exercises - Lower Extremity Ankle Circles/Pumps: AROM;Both;10 reps;Seated Long Arc Quad: Strengthening;Both;10 reps;Seated Hip ABduction/ADduction: Strengthening;Both;10 reps;Seated Hip Flexion/Marching: Strengthening;Both;10 reps;Seated      Assessment/Plan    PT Assessment Patient needs continued PT services  PT Diagnosis Abnormality of gait;Generalized weakness   PT Problem List Decreased balance;Decreased mobility;Decreased strength;Decreased knowledge of use of DME  PT Treatment Interventions Gait training;Functional mobility training;Therapeutic activities;Therapeutic exercise;Balance training   PT Goals (Current goals can be found in the Care Plan section) Acute Rehab PT Goals Patient Stated Goal: go home PT Goal Formulation: With patient Time For Goal Achievement: 12/15/15 Potential to Achieve Goals: Good    Frequency Min 4X/week   Barriers to discharge        Co-evaluation               End of Session Equipment Utilized During Treatment: Gait belt Activity Tolerance: Patient tolerated treatment well Patient left: in chair;with  call bell/phone within reach;with chair alarm set Nurse Communication: Patient requests pain meds    Functional Assessment Tool Used: clinical judgement and objective findings. Functional Limitation: Mobility: Walking and moving around Mobility: Walking and Moving Around Current Status [phone]): At least 1 percent but less than 20 percent impaired, limited or restricted Mobility: Walking and Moving Around Goal Status [phone]): At least 1 percent but less than 20 percent impaired, limited or restricted    Time: 8469-6295 PT Time Calculation (min) (ACUTE ONLY): 35 min   Charges:   PT Evaluation $PT Eval Moderate Complexity: 1 Procedure PT Treatments $Gait Training: 8-22 mins   PT G Codes:   PT G-Codes **NOT FOR INPATIENT CLASS** Functional Assessment Tool Used: clinical judgement and objective findings. Functional Limitation: Mobility: Walking and moving around Mobility: Walking and Moving Around Current Status [phone]): At least 1 percent but less than 20 percent impaired, limited or restricted Mobility: Walking and Moving Around Goal Status [phone]): At least 1 percent but less than 20 percent impaired, limited or restricted    Jefferey Lippmann LUBECK 12/08/2015, 10:00 AM

## 2015-12-08 NOTE — Evaluation (Addendum)
Clinical/Bedside Swallow Evaluation Patient Details  Name: [name] [name] MRN: [id number] Date of Birth: [birth year]  Today's Date: 12/08/2015 Time: SLP Start Time (ACUTE ONLY): 0830 SLP Stop Time (ACUTE ONLY): 0840 SLP Time Calculation (min) (ACUTE ONLY): 10 min  Past Medical History:  Past Medical History  Diagnosis Date  . Hypertension   . Diabetes mellitus   . Obesity (BMI 35.0-39.9 without comorbidity) (HCC)   . GERD (gastroesophageal reflux disease)   . Chest pain 2009    MV with no scar or ischemia, EF 65%  . History of cardiac catheterization     a. Myoview 2/15 with anteroapical ischemia, EF 64% (intermediate risk) >> LHC - normal cos, EF 55-60%  . Thoracic ascending aortic aneurysm (HCC)     a. CT 1/17: ascending aorta 4.2 x 4.0 cm  . Pneumonia    Past Surgical History:  Past Surgical History  Procedure Laterality Date  . Wisdom tooth extraction    . Cholecystectomy    . Temporomandibular joint surgery    . Abdominal hysterectomy  2006    Partial, one ovary left   . Cardiac [place]  2004    [place] per pt.  . Left heart catheterization with coronary angiogram N/A 01/09/2014    Procedure: LEFT HEART CATHETERIZATION WITH CORONARY ANGIOGRAM;  Surgeon: Marykay Lex, MD;  Location: [facility] CATH LAB;  Service: Cardiovascular;  Laterality: N/A;   HPI:  [name] [name] is a 70 y.o. female with a Past Medical History of HTN, DM, GERD, CP, cardiac catheterization without occlusive disease, T AAA at 4.2 cm who presents with strokelike symptoms MRI negative for acute changes    Assessment / Plan / Recommendation Clinical Impression   Pt presents with grossly intact orophayngeal swallowing function.  No overt s/s of aspiration were evident with thin liquids or solids and pt was able to effectively clear solids from the oral cavity post swallow with extra time to accomodate for fatigue.  Pt did complain of pain when swallowing and at times exhibited extra  swallows following boluses; however, RN reports frequent dry hacking cough today with pt complaints of sore throat which is likely causing the abovementioned presentation.  Recommend that pt resume a regular diet with thin liquids.  SLP will sign off on dysphagia at this time.  Cognitive-linguistic evaluation completed on this date.  See separate report.      Aspiration Risk  No limitations    Diet Recommendation Regular;Thin liquid   Liquid Administration via: Cup;Straw Medication Administration: Whole meds with liquid Supervision: Patient able to self feed    Other  Recommendations Oral Care Recommendations: Oral care BID   Follow up Recommendations  Other (comment) (TBD)    Frequency and Duration min 1 x/week  1 week       Prognosis Prognosis for Safe Diet Advancement: Good      Swallow Study   General Date of Onset: 12/07/15 HPI: [name] [name] is a 70 y.o. female with a Past Medical History of HTN, DM, GERD, CP, cardiac catheterization without occlusive disease, T AAA at 4.2 cm who presents with strokelike symptoms MRI negative for acute changes  Type of Study: Bedside Swallow Evaluation Previous Swallow Assessment: none on record  Diet Prior to this Study: NPO Temperature Spikes Noted: No Respiratory Status: Room air History of Recent Intubation: No Behavior/Cognition: Lethargic/Drowsy;Cooperative;Pleasant mood Oral Cavity Assessment: Within Functional Limits Oral Care Completed by SLP: No Oral Cavity - Dentition: Missing dentition Vision: Functional for self-feeding Self-Feeding  Abilities: Able to feed self Patient Positioning: Upright in bed Baseline Vocal Quality: Normal Volitional Cough: Strong Volitional Swallow: Able to elicit    Oral/Motor/Sensory Function Overall Oral Motor/Sensory Function: Within functional limits   Ice Chips     Thin Liquid Thin Liquid: Impaired Presentation: Straw;Self Fed Pharyngeal  Phase Impairments: Suspected delayed  Swallow;Multiple swallows (pt reports some pain when swallowing) Other Comments: pain when swallowing, RN reports dry hacking cough all morning, suspect pt with sore throat    Nectar Thick     Honey Thick     Puree Puree: Within functional limits   Solid   GO   Solid: Impaired Presentation: Self Fed Oral Phase Impairments: Other (comment) (prolonged but effective mastication ) Oral Phase Functional Implications: Prolonged oral transit Other Comments: suspect prolonged oral phase due to fatigue       [name] [name], [name] [name] 12/08/2015,9:18 AM

## 2015-12-09 ENCOUNTER — Observation Stay (HOSPITAL_BASED_OUTPATIENT_CLINIC_OR_DEPARTMENT_OTHER): Payer: Medicare Other

## 2015-12-09 ENCOUNTER — Other Ambulatory Visit (HOSPITAL_COMMUNITY): Payer: Medicare Other

## 2015-12-09 ENCOUNTER — Other Ambulatory Visit: Payer: Medicare Other

## 2015-12-09 DIAGNOSIS — I5032 Chronic diastolic (congestive) heart failure: Secondary | ICD-10-CM | POA: Diagnosis not present

## 2015-12-09 DIAGNOSIS — R079 Chest pain, unspecified: Secondary | ICD-10-CM | POA: Diagnosis not present

## 2015-12-09 DIAGNOSIS — G459 Transient cerebral ischemic attack, unspecified: Secondary | ICD-10-CM

## 2015-12-09 DIAGNOSIS — I10 Essential (primary) hypertension: Secondary | ICD-10-CM

## 2015-12-09 DIAGNOSIS — E118 Type 2 diabetes mellitus with unspecified complications: Secondary | ICD-10-CM | POA: Diagnosis not present

## 2015-12-09 DIAGNOSIS — M6289 Other specified disorders of muscle: Secondary | ICD-10-CM | POA: Diagnosis not present

## 2015-12-09 LAB — BASIC METABOLIC PANEL
Anion gap: 5 (ref 5–15)
BUN: 17 mg/dL (ref 6–20)
CHLORIDE: 109 mmol/L (ref 101–111)
CO2: 26 mmol/L (ref 22–32)
CREATININE: 0.88 mg/dL (ref 0.44–1.00)
Calcium: 8.7 mg/dL — ABNORMAL LOW (ref 8.9–10.3)
GFR calc Af Amer: >60 mL/min (ref >60)
GLUCOSE: 134 mg/dL — AB (ref 65–99)
POTASSIUM: 3.8 mmol/L (ref 3.5–5.1)
Sodium: 140 mmol/L (ref 135–145)

## 2015-12-09 LAB — GLUCOSE, CAPILLARY
GLUCOSE-CAPILLARY: 157 mg/dL — AB (ref 65–99)
GLUCOSE-CAPILLARY: 210 mg/dL — AB (ref 65–99)
Glucose-Capillary: 126 mg/dL — ABNORMAL HIGH (ref 65–99)

## 2015-12-09 LAB — HEMOGLOBIN A1C
Hgb A1c MFr Bld: 8 % — ABNORMAL HIGH (ref 4.8–5.6)
MEAN PLASMA GLUCOSE: 183 mg/dL

## 2015-12-09 MED ORDER — ATORVASTATIN CALCIUM 10 MG PO TABS: 10.0000 mg | ORAL_TABLET | Freq: Every day | ORAL | Status: DC

## 2015-12-09 MED ORDER — ASPIRIN 325 MG PO TABS: 325.0000 mg | ORAL_TABLET | Freq: Every day | ORAL | Status: DC

## 2015-12-09 NOTE — Care Management Note (Signed)
Case Management Note  Patient Details  Name: [name] [name] MRN: [id number] Date of Birth: 1946-06-18  Subjective/Objective:                    Action/Plan: Patient is discharging home today with orders for home health services. CM met with [name] and provided her a list of home health agencies in the [facility] area. She selected [place]. Mary with [facility] notified and accepted the referral. Patient also ordered a 3 in 1, walker and tub bench. [name] insurance will not cover the cost of the tub bench and she would like to obtain this at an outside source. Jermaine with [facility] was notified of the 3 in 1 and walker and he will deliver them to the unit. Bedside RN updated.   Expected Discharge Date:                  Expected Discharge Plan:  [place]  In-House Referral:     Discharge planning Services  CM Consult  Post Acute Care Choice:  Durable Medical Equipment, Home Health Choice offered to:  Patient  DME Arranged:  3-N-1, Walker rolling DME Agency:  [place] Arranged:  PT, OT HH Agency:  [facility]  Status of Service:  Completed, signed off  Medicare Important Message Given:    Date Medicare IM Given:    Medicare IM give by:    Date Additional Medicare IM Given:    Additional Medicare Important Message give by:     If discussed at [place] of Stay Meetings, dates discussed:    Additional Comments:  Pollie Friar, RN 12/09/2015, 10:52 AM

## 2015-12-09 NOTE — Discharge Summary (Addendum)
Physician Discharge Summary   Patient ID: [name] [name] MRN: [id number] DOB/AGE: November 24, 1945 70 y.o.  Admit date: 12/07/2015 Discharge date: 12/09/2015  Primary Care Physician:  Verlon Au, MD  Discharge Diagnoses:    . acute left-sided weakness likely due to TIA  . Hypertension . Obesity (BMI 35.0-39.9 without comorbidity) (HCC) . Chronic diastolic heart failure, NYHA class 2 (HCC) . Pulmonary HTN (HCC) . Acute left-sided weakness . Chronic chest pain . Hypokalemia . Thoracic aortic aneurysm (HCC) . Left sided numbness  Consults:  Neurology  Recommendations for Outpatient Follow-up:  1. Continue aspirin 325 mg daily 2. Please repeat CBC/BMET at next visit 3. PT, OT arranged by case management 4. Outpatient vascular surgery referral deferred to PCP for thoracic ascending aortic aneurysm   DIET: Carb modified diet    Allergies:   Allergies  Allergen Reactions  . Codeine Nausea And Vomiting  . Adhesive [Tape] Rash    pls use elastic wraps instead of adhesive tape     DISCHARGE MEDICATIONS: Current Discharge Medication List    START taking these medications   Details  atorvastatin (LIPITOR) 10 MG tablet Take 1 tablet (10 mg total) by mouth daily at 6 PM. Qty: 30 tablet, Refills: 3      CONTINUE these medications which have CHANGED   Details  aspirin 325 MG tablet Take 1 tablet (325 mg total) by mouth daily. Qty: 30 tablet, Refills: 4      CONTINUE these medications which have NOT CHANGED   Details  albuterol (PROVENTIL HFA;VENTOLIN HFA) 108 (90 BASE) MCG/ACT inhaler Inhale 1-2 puffs into the lungs every 6 (six) hours as needed for wheezing. Qty: 1 Inhaler, Refills: 0    glipiZIDE (GLUCOTROL) 5 MG tablet Take 5 mg by mouth 2 (two) times daily before a meal.     HYDROcodone-acetaminophen (NORCO/VICODIN) 5-325 MG tablet Take 1 tablet by mouth every 6 (six) hours as needed for moderate pain or severe pain. Qty: 6 tablet, Refills: 0    ibuprofen  (ADVIL,MOTRIN) 200 MG tablet Take 200 mg by mouth every 6 (six) hours as needed for headache or mild pain.    lisinopril-hydrochlorothiazide (PRINZIDE,ZESTORETIC) 10-12.5 MG per tablet Take 1 tablet by mouth daily.    metoprolol succinate (TOPROL XL) 50 MG 24 hr tablet Take 1 tablet (50 mg total) by mouth daily. Take with or immediately following a meal. Qty: 30 tablet, Refills: 6   Associated Diagnoses: Essential hypertension    omeprazole (PRILOSEC) 40 MG capsule Take 40 mg by mouth daily.    ONE TOUCH ULTRA TEST test strip 1 each by Other route as needed (BLOOD GLUCOSE MONITORING).  Refills: 0    potassium chloride SA (K-DUR,KLOR-CON) 20 MEQ tablet Take 1 tablet (20 mEq total) by mouth daily. Qty: 30 tablet, Refills: 11   Associated Diagnoses: Hypokalemia      STOP taking these medications     benzonatate (TESSALON) 200 MG capsule      doxycycline (VIBRAMYCIN) 100 MG capsule      estradiol (ESTRACE VAGINAL) 0.1 MG/GM vaginal cream      diclofenac (VOLTAREN) 75 MG EC tablet      lansoprazole (PREVACID) 30 MG capsule      methocarbamol (ROBAXIN) 500 MG tablet          Brief H and P: For complete details please refer to admission H and P, but in brief [place] L Geddes is a 70 y.o. female with a Past Medical History of HTN, DM, GERD, CP, cardiac  catheterization without occlusive disease, T AAA at 4.2 cm who presented with chest discomfort, left-sided weakness. Patient had reported sudden onset of left upper extremity weakness, tingling, facial tingling that began 4 hours prior to presentation. She also reported sharp midsternal chest pain or shortness of breath. CT head in ED showed no acute CVA. CT chest and aorta showed no aortic dissection or definitive central pulmonary artery embolism. Patient was admitted for further workup.  Hospital Course:  Acute left-sided weakness/history of CVA with prior residual left-sided deficit likely TIA - Neurology was consulted - MRI brain  showed no acute intracranial abnormalities - MRA showed normal intracranial circulation  - 2-D echo showed EF of 60-65% with grade 1 diastolic dysfunction - Carotid Dopplers showed no blockage or stenosis in the ICAs - Neurology recommended aspirin 325 mg daily - Lipid panel showed LDL 85, goal less than 70, placed on statin - Hemoglobin A1c 8.2 - PT evaluation recommending home health PT    Hypokalemia -Resolved  likely due to HCTZ use  Thoracic aneurysm -Incidental finding on ER CTA of chest on 1/5however CT angiogram of the chest with and aorta protocol showed mildly tortuous thoracic aorta otherwise unremarkable, no mention of ascending aortic aneurysm  -Cardiology following and had ordered outpatient echo. 2-D echo done this admission showed normal aortic root and ascending aorta, mildly dilated.  - outpatient [facility] referral/ evaluation deferred to PCP    Chronic chest pain -Documented as having a chronic component based on outpatient cardiology evaluation on 1/11 as well as a focal area of reproducible anterior chest wall pain -Troponins negative, 2-D echo with preserved EF, no regional wall motion abnormalities. EKG showed no acute ST-T wave changes. - Prior NM stress February 2015 was suggestive of anterior apical ischemia but left heart cath demonstrated normal coronary arteries -Recently treated as an outpatient with doxycycline for presumed pneumonia/bronchitis, possibly esophagitis    Diabetes mellitus, type 2  -Continue preadmission Glucotrol -SSI -Hgb A1c   Hypertension -Moderate controlled -Continue preadmission lisinopril with HCTZ, Toprol-XL   Obesity (BMI 35.0-39.9 without comorbidity) -Patient counseled on diet and weight control  Chronic diastolic dysfunction -Chest x-ray questioning some mild vascular congestion, chronic dyspnea on exertion.  - 2-D echo showed EF 60-65% with grade 1 diastolic dysfunction.  - currently stable  Acute on chronic  right maxillary sinusitis -Currently asymptomatic -Recently completed doxycycline as an outpatient this past Sunday  Day of Discharge BP 138/63 mmHg  Pulse 67  Temp(Src) 98.1 F (36.7 C) (Oral)  Resp 25  Ht  (1.6 m)  Wt 87.544 kg (193 lb)  BMI 34.20 kg/m2  SpO2 97%  Physical Exam: General: Alert and awake oriented x3 not in any acute distress. HEENT: anicteric sclera, pupils reactive to light and accommodation CVS: S1-S2 clear no murmur rubs or gallops Chest: clear to auscultation bilaterally, no wheezing rales or rhonchi Abdomen: soft nontender, nondistended, normal bowel sounds Extremities: no cyanosis, clubbing or edema noted bilaterally Neuro: Cranial nerves II-XII intact, no focal neurological deficits   The results of significant diagnostics from this hospitalization (including imaging, microbiology, ancillary and laboratory) are listed below for reference.    LAB RESULTS: Basic Metabolic Panel:  Recent Labs Lab 12/08/15 1330 12/09/15 0531  NA 139 140  K 4.2 3.8  CL 103 109  CO2 24 26  GLUCOSE 156* 134*  BUN 20 17  CREATININE 0.96 0.88  CALCIUM 9.1 8.7*   Liver Function Tests:  Recent Labs Lab 12/07/15 0233  AST 17  ALT 11*  ALKPHOS 92  BILITOT 0.6  PROT 6.9  ALBUMIN 3.6   No results for input(s): LIPASE, AMYLASE in the last 168 hours. No results for input(s): AMMONIA in the last 168 hours. CBC:  Recent Labs Lab 12/07/15 0233 12/07/15 0243 12/07/15 0926  WBC 7.5  --  4.1  NEUTROABS 1.8  --  3.5  HGB 14.9 16.7* 14.3  HCT 44.1 49.0* 42.4  MCV 89.5  --  90.4  PLT 290  --  269   Cardiac Enzymes:  Recent Labs Lab 12/07/15 0926 12/07/15 1535  TROPONINI <0.03 <0.03   BNP: Invalid input(s): POCBNP CBG:  Recent Labs Lab 12/09/15 0638 12/09/15 1105  GLUCAP 126* 210*    Significant Diagnostic Studies:  Dg Chest 2 View  12/07/2015  CLINICAL DATA:  Acute onset of centralized chest pain and shortness of breath. Initial  encounter. EXAM: CHEST  2 VIEW COMPARISON:  Chest radiograph and CTA of the chest performed 11/25/2015 FINDINGS: The lungs are mildly hypoexpanded. Vascular crowding and mild vascular congestion are noted. There is mild elevation of the right hemidiaphragm. Increased interstitial markings could reflect minimal interstitial edema or mild pneumonia. There is no evidence of pleural effusion or pneumothorax. The heart is mildly enlarged. No acute osseous abnormalities are seen. Clips are noted within the right upper quadrant, reflecting prior cholecystectomy. IMPRESSION: Lungs mildly hypoexpanded. Mild vascular congestion and mild cardiomegaly noted. Mild elevation of the right hemidiaphragm. Increased interstitial markings could reflect minimal interstitial edema or mild pneumonia. Electronically Signed   By: Roanna Raider M.D.   On: 12/07/2015 03:44   Ct Head Wo Contrast  12/07/2015  CLINICAL DATA:  Acute onset of left-sided weakness and difficulty smiling. Code stroke. Initial encounter. EXAM: CT HEAD WITHOUT CONTRAST TECHNIQUE: Contiguous axial images were obtained from the base of the skull through the vertex without intravenous contrast. COMPARISON:  CT of the head performed 03/07/2015 FINDINGS: There is no evidence of acute infarction, mass lesion, or intra- or extra-axial hemorrhage on CT. A small chronic infarct is noted at the right external capsule. The posterior fossa, including the cerebellum, brainstem and fourth ventricle, is within normal limits. The third and lateral ventricles are unremarkable in appearance. The cerebral hemispheres are symmetric in appearance, with normal gray-white differentiation. No mass effect or midline shift is seen. There is no evidence of fracture; visualized osseous structures are unremarkable in appearance. The visualized portions of the orbits are within normal limits. There is inspissated mucus within the right maxillary sinus. The remaining paranasal sinuses and  mastoid air cells are well-aerated. No significant soft tissue abnormalities are seen. IMPRESSION: 1. No acute intracranial pathology seen on CT. 2. Small chronic infarct at the right external capsule. 3. Inspissated mucus within the right maxillary sinus. These results were called by telephone at the time of interpretation on 12/07/2015 at 2:50 am to Dr. Roseanne Reno, who verbally acknowledged these results. Electronically Signed   By: Roanna Raider M.D.   On: 12/07/2015 02:51   Mr Shirlee Latch Wo Contrast  12/07/2015  CLINICAL DATA:  70 year old female with previous stroke resulting in left side weakness. Complains of increased left side weakness, chest pain, shortness of breath. Initial encounter. EXAM: MRI HEAD WITHOUT CONTRAST MRA HEAD WITHOUT CONTRAST TECHNIQUE: Multiplanar, multiecho pulse sequences of the brain and surrounding structures were obtained without intravenous contrast. Angiographic images of the head were obtained using MRA technique without contrast. COMPARISON:  Head CT without contrast 0242 hours today. Brain MRI and MRA 07/01/2012. FINDINGS:  MRI HEAD FINDINGS Major intracranial vascular flow voids are stable. No restricted diffusion to suggest acute infarction. No midline shift, mass effect, evidence of mass lesion, ventriculomegaly, extra-axial collection or acute intracranial hemorrhage. Cervicomedullary junction and pituitary are within normal limits. Negative visualized cervical spine. Cerebral volume is not significantly changed since 2013. Mild for age scattered and small foci of nonspecific cerebral white matter T2 and FLAIR hyperintensity have not significantly changed. No cortical encephalomalacia. There is a small cluster of chronic micro hemorrhage in the anterior right corona radiata (series 5, image 63) which is more apparent today owing to susceptibility weighted imaging. No other chronic cerebral blood products. Deep gray matter nuclei and brainstem are within normal limits. There is  a tiny chronic lacune in the right cerebellum which is new (series 7, image 8). The cerebellum otherwise appears normal. Visible internal auditory structures appear normal. Mastoids are clear. Chronic right maxillary sinusitis with superimposed fluid level today. Mild ethmoid sinus mucosal disease is stable. Small volume retained secretions in the nasopharynx. Orbit and scalp soft tissues are within normal limits. Normal bone marrow signal. MRA HEAD FINDINGS Stable antegrade flow in the posterior circulation with mildly dominant distal left vertebral artery. PICA origins remain patent. Basilar artery remains patent without stenosis. Mild ectasia at the basilar artery tip appears stable. PCA origins remain normal. Posterior communicating arteries are diminutive or absent. Bilateral PCA branches are stable and within normal limits. Stable antegrade flow in both ICA siphons. Mild siphon irregularity in keeping with atherosclerosis, exacerbated by motion artifact on today study. No siphon stenosis identified. Stable carotid termini. Dominant left ACA A1 segment re- demonstrated with diminutive or absent right A1. Left ACA and MCA origins are normal. Anterior communicating artery and visualized ACA branches are stable, the left ACA remains dominant. MCA M1 segments and bifurcations are stable and within normal limits. Visualized MCA branches are stable and within normal limits. IMPRESSION: 1.  No acute intracranial abnormality. 2. Mild for age chronic small vessel disease, not significantly changed since 2013. 3. Stable and negative for age intracranial MRA. 4. Acute on chronic right maxillary sinusitis. Electronically Signed   By: Odessa Fleming M.D.   On: 12/07/2015 08:49   Mr Brain Wo Contrast  12/07/2015  CLINICAL DATA:  70 year old female with previous stroke resulting in left side weakness. Complains of increased left side weakness, chest pain, shortness of breath. Initial encounter. EXAM: MRI HEAD WITHOUT CONTRAST MRA  HEAD WITHOUT CONTRAST TECHNIQUE: Multiplanar, multiecho pulse sequences of the brain and surrounding structures were obtained without intravenous contrast. Angiographic images of the head were obtained using MRA technique without contrast. COMPARISON:  Head CT without contrast 0242 hours today. Brain MRI and MRA 07/01/2012. FINDINGS: MRI HEAD FINDINGS Major intracranial vascular flow voids are stable. No restricted diffusion to suggest acute infarction. No midline shift, mass effect, evidence of mass lesion, ventriculomegaly, extra-axial collection or acute intracranial hemorrhage. Cervicomedullary junction and pituitary are within normal limits. Negative visualized cervical spine. Cerebral volume is not significantly changed since 2013. Mild for age scattered and small foci of nonspecific cerebral white matter T2 and FLAIR hyperintensity have not significantly changed. No cortical encephalomalacia. There is a small cluster of chronic micro hemorrhage in the anterior right corona radiata (series 5, image 63) which is more apparent today owing to susceptibility weighted imaging. No other chronic cerebral blood products. Deep gray matter nuclei and brainstem are within normal limits. There is a tiny chronic lacune in the right cerebellum which is new (series 7,  image 8). The cerebellum otherwise appears normal. Visible internal auditory structures appear normal. Mastoids are clear. Chronic right maxillary sinusitis with superimposed fluid level today. Mild ethmoid sinus mucosal disease is stable. Small volume retained secretions in the nasopharynx. Orbit and scalp soft tissues are within normal limits. Normal bone marrow signal. MRA HEAD FINDINGS Stable antegrade flow in the posterior circulation with mildly dominant distal left vertebral artery. PICA origins remain patent. Basilar artery remains patent without stenosis. Mild ectasia at the basilar artery tip appears stable. PCA origins remain normal. Posterior  communicating arteries are diminutive or absent. Bilateral PCA branches are stable and within normal limits. Stable antegrade flow in both ICA siphons. Mild siphon irregularity in keeping with atherosclerosis, exacerbated by motion artifact on today study. No siphon stenosis identified. Stable carotid termini. Dominant left ACA A1 segment re- demonstrated with diminutive or absent right A1. Left ACA and MCA origins are normal. Anterior communicating artery and visualized ACA branches are stable, the left ACA remains dominant. MCA M1 segments and bifurcations are stable and within normal limits. Visualized MCA branches are stable and within normal limits. IMPRESSION: 1.  No acute intracranial abnormality. 2. Mild for age chronic small vessel disease, not significantly changed since 2013. 3. Stable and negative for age intracranial MRA. 4. Acute on chronic right maxillary sinusitis. Electronically Signed   By: Odessa Fleming M.D.   On: 12/07/2015 08:49   Dg Shoulder Left  12/07/2015  CLINICAL DATA:  Fall in 2015 with pain since that time. Her pain has increased over the last month. EXAM: LEFT SHOULDER - 2+ VIEW COMPARISON:  None. FINDINGS: No fracture or dislocation. Mild AC joint degenerative changes. Limited views of left chest are normal. No acute abnormalities or cause for the patient's pain identified. IMPRESSION: No cause for the patient's pain identified. Electronically Signed   By: Gerome Sam III M.D   On: 12/07/2015 10:54   Ct Angio Chest Aorta W/cm &/or Wo/cm  12/07/2015  CLINICAL DATA:  70 year old female with chest pain and shortness of breath EXAM: CT ANGIOGRAPHY CHEST WITH CONTRAST TECHNIQUE: Multidetector CT imaging of the chest was performed using the standard protocol during bolus administration of intravenous contrast. Multiplanar CT image reconstructions and MIPs were obtained to evaluate the vascular anatomy. CONTRAST:  OMNIPAQUE IOHEXOL 350 MG/ML SOLN COMPARISON:  Chest radiograph dated  12/07/2015 and CT dated 11/25/2015 FINDINGS: Bibasilar linear densities and ground-glass opacities most compatible with atelectatic changes. No focal consolidation, pleural effusion, or pneumothorax. The central airways are patent. The thoracic aorta is mildly tortuous otherwise unremarkable. Evaluation of the pulmonary artery is limited due to suboptimal opacification and timing of the contrast. No definite central pulmonary artery embolus identified. Mild cardiomegaly. No pericardial effusion. There is no hilar or mediastinal adenopathy. The esophagus is grossly unremarkable. Subcentimeter left thyroid hypodense nodule. There is no axillary adenopathy. The chest wall soft tissues appear unremarkable. There is degenerative changes of the spine. No acute fracture. Constipation. Diverticulosis. Cholecystectomy. The visualized upper abdomen is otherwise unremarkable. Review of the MIP images confirms the above findings. IMPRESSION: No CT evidence of aortic dissection. No definite central pulmonary artery embolus. Electronically Signed   By: Elgie Collard M.D.   On: 12/07/2015 05:43    2D ECHO: Study Conclusions  - Left ventricle: The cavity size was normal. Wall thickness was increased in a pattern of severe LVH. Systolic function was normal. The estimated ejection fraction was in the range of 60% to 65%. Wall motion was normal; there were  no regional wall motion abnormalities. Doppler parameters are consistent with abnormal left ventricular relaxation (grade 1 diastolic dysfunction). - Atrial septum: No defect or patent foramen ovale was identified.  Disposition and Follow-up: Discharge Instructions    Diet Carb Modified    Complete by:  As directed      Increase activity slowly    Complete by:  As directed             DISPOSITION: home    DISCHARGE FOLLOW-UP Follow-up Information    Go to Delia Heady, MD.   Specialties:  Neurology, Radiology   Why:  for hospital  follow-up/ TIA follow-up/// sp w/ Diane apptmt is Monday, 02/14/16 at 12:30pm for a 1:00pm apptmt.   Contact information:   [address] [place] [address] [phone]       Go to Verlon Au, MD.   Specialty:  Family Medicine   Why:  for hospital follow-up/////Thursday, 12/23/15 at 9:30 am   Contact information:   [address] [place]retta Shorten [place] [address] [phone]        Time spent on Discharge: 35 mins   Signed:   Destenee Guerry M.D. [facility] 12/09/2015, 1:12 PM Pager: [phone]

## 2015-12-09 NOTE — Progress Notes (Signed)
PT Cancellation Note  Patient Details Name: [name] [name] MRN: [id number] DOB: 01-15-1946   Cancelled Treatment:    Reason Eval/Treat Not Completed: Patient at procedure or test/unavailable. Pt being taken off the floor to vascular lab.  Briefly spoke to pt and she stated she did speak with family and they will be able to provide A at home.  Will check back as schedule permits. Clydie Braun L. Katrinka Blazing, Lydia Pager [phone] 12/09/2015    Kashlyn Salinas LUBECK 12/09/2015, 8:53 AM

## 2015-12-09 NOTE — Progress Notes (Signed)
Occupational Therapy Treatment/Discharge Patient Details Name: [name] [name] MRN: [id number] DOB: [birth year] Today's Date: 12/09/2015    History of present illness [name] [name] is an 70 y.o. female history of previous stroke, hypertension and diabetes mellitus, as well as ascending aortic aneurysm, presenting with new onset numbness and weakness of a left-sided upper extremity as well as left facial numbness and slurred speech.    OT comments  Provided and practiced HEP with theraband to increase ROM and strength in ROM, also provided functional task list to be completed with LUE for pt to incorporate into daily ADL routine. All education has been completed and pt has no further questions. Pt adequate for discharge with [facility] set up. Pt with no further acute OT needs. OT signing off.    Follow Up Recommendations  Home health OT;Supervision/Assistance - 24 hour    Equipment Recommendations  3 in 1 bedside comode;Other (comment) (RW-2 wheeled)    Recommendations for Other Services      Precautions / Restrictions Precautions Precautions: Fall Restrictions Weight Bearing Restrictions: No       Mobility Bed Mobility Overal bed mobility: Needs Assistance Bed Mobility: Supine to Sit     Supine to sit: Supervision;HOB elevated     General bed mobility comments: Pt progressed EOB quickly before therapist could flatten bed to simulate home environment. No physical assist required  Transfers Overall transfer level: Needs assistance Equipment used: Rolling walker (2 wheeled) Transfers: Sit to/from Stand Sit to Stand: Supervision         General transfer comment: Verbal cues for hand placement on seated surfaces. No physical assist requried.    Balance Overall balance assessment: Needs assistance Sitting-balance support: No upper extremity supported;Feet supported Sitting balance-Leahy Scale: Good     Standing balance support: Bilateral upper extremity supported;During  functional activity Standing balance-Leahy Scale: Poor Standing balance comment: Required at least 1 extremity support to maintain dynamic balance                   ADL Overall ADL's : Needs assistance/impaired                                 Tub/ Shower Transfer: Tub transfer;Min guard;Cueing for sequencing;Ambulation;Rolling walker Tub/Shower Transfer Details (indicate cue type and reason): Cues for step sequence with use of RW Functional mobility during ADLs: Min guard;Rolling walker General ADL Comments: Practiced tub transfer with RW - pt unsure if RW will fit in bathroom so advised pt to have son/daughter with her for the first few times getting in/out of shower. Provided functional task list and HEP to strength and increase ROM in LUE.       Vision                     Perception     Praxis      Cognition   Behavior During Therapy: [facility] for tasks assessed/performed Overall Cognitive Status: Within Functional Limits for tasks assessed                       Extremity/Trunk Assessment               Exercises General Exercises - Upper Extremity Shoulder Flexion: AROM;Strengthening;Left;Seated;Theraband;5 reps Theraband Level (Shoulder Flexion): Level 1 (Yellow) Shoulder Extension: AROM;Strengthening;Left;5 reps;Seated;Theraband Theraband Level (Shoulder Extension): Level 1 (Yellow)   Shoulder Instructions       General Comments  Pertinent Vitals/ Pain       Pain Assessment: No/denies pain  Home Living                                          Prior Functioning/Environment              Frequency       Progress Toward Goals  OT Goals(current goals can now be found in the care plan section)  Progress towards OT goals: Goals met/education completed, patient discharged from OT  Acute Rehab OT Goals Patient Stated Goal: to go home OT Goal Formulation: With patient Time For Goal Achievement:  12/22/15 Potential to Achieve Goals: Good ADL Goals Pt Will Perform Upper Body Bathing: with modified independence;standing Pt Will Perform Lower Body Bathing: with modified independence;sit to/from stand Pt Will Transfer to Toilet: with modified independence;ambulating;bedside commode Pt Will Perform Toileting - Clothing Manipulation and hygiene: with modified independence;sitting/lateral leans;sit to/from stand Pt Will Perform Tub/Shower Transfer: Tub transfer;with supervision;ambulating;rolling walker Pt/caregiver will Perform Home Exercise Program: Increased ROM;Increased strength;Left upper extremity;Independently;With theraband;With theraputty;With written HEP provided  Plan All goals met and education completed, patient discharged from OT services    Co-evaluation                 End of Session Equipment Utilized During Treatment: Gait belt;Rolling walker   Activity Tolerance Patient tolerated treatment well   Patient Left in chair;with call bell/phone within reach;with chair alarm set   Nurse Communication Mobility status    Functional Assessment Tool Used: clinical judgement Functional Limitation: Self care Self Care Current Status (J5701): At least 1 percent but less than 20 percent impaired, limited or restricted Self Care Goal Status (X7939): At least 1 percent but less than 20 percent impaired, limited or restricted Self Care Discharge Status [phone]): At least 1 percent but less than 20 percent impaired, limited or restricted   Time: 1133-1150 OT Time Calculation (min): 17 min  Charges: OT G-codes **NOT FOR INPATIENT CLASS** Functional Assessment Tool Used: clinical judgement Functional Limitation: Self care Self Care Current Status (Q3300): At least 1 percent but less than 20 percent impaired, limited or restricted Self Care Goal Status (T6226): At least 1 percent but less than 20 percent impaired, limited or restricted Self Care Discharge Status [phone]): At  least 1 percent but less than 20 percent impaired, limited or restricted OT General Charges $OT Visit: 1 Procedure OT Treatments $Self Care/Home Management : 8-22 mins  Redmond Baseman, OTR/L Pager: [phone] 12/09/2015, 12:00 PM

## 2015-12-09 NOTE — Progress Notes (Signed)
VASCULAR LAB PRELIMINARY  PRELIMINARY  PRELIMINARY  PRELIMINARY   Bilateral carotid duplex has been completed. Bilateral:  No plaque or stenosis in ICAs.  Vertebral artery flow is antegrade.   No change compare to study of 05-06-12.  Verlan Grotz, RVT, RDMS 12/09/2015, 9:35 AM

## 2015-12-18 ENCOUNTER — Other Ambulatory Visit: Payer: Self-pay | Admitting: Obstetrics & Gynecology

## 2015-12-21 ENCOUNTER — Other Ambulatory Visit: Payer: Self-pay | Admitting: Obstetrics & Gynecology

## 2016-01-04 ENCOUNTER — Other Ambulatory Visit: Payer: Self-pay

## 2016-01-04 DIAGNOSIS — N644 Mastodynia: Secondary | ICD-10-CM

## 2016-01-04 DIAGNOSIS — R9389 Abnormal findings on diagnostic imaging of other specified body structures: Secondary | ICD-10-CM

## 2016-02-02 ENCOUNTER — Ambulatory Visit: Payer: Medicare Other | Admitting: Cardiology

## 2016-02-14 ENCOUNTER — Ambulatory Visit: Payer: Self-pay | Admitting: Neurology

## 2016-06-19 ENCOUNTER — Other Ambulatory Visit: Payer: Self-pay | Admitting: Physician Assistant

## 2016-06-19 DIAGNOSIS — I10 Essential (primary) hypertension: Secondary | ICD-10-CM

## 2016-08-07 ENCOUNTER — Encounter (HOSPITAL_BASED_OUTPATIENT_CLINIC_OR_DEPARTMENT_OTHER): Payer: Self-pay

## 2016-08-07 ENCOUNTER — Ambulatory Visit (HOSPITAL_BASED_OUTPATIENT_CLINIC_OR_DEPARTMENT_OTHER): Admit: 2016-08-07 | Payer: Medicare Other | Admitting: Otolaryngology

## 2016-08-07 SURGERY — EXCISION, CYST, EAR
Anesthesia: General | Laterality: Left

## 2016-08-25 DIAGNOSIS — M8580 Other specified disorders of bone density and structure, unspecified site: Secondary | ICD-10-CM | POA: Insufficient documentation

## 2016-08-26 ENCOUNTER — Observation Stay (HOSPITAL_COMMUNITY)
Admission: EM | Admit: 2016-08-26 | Discharge: 2016-08-29 | Disposition: A | Discharge status: Home / Self Care | Payer: Medicare Other | Attending: Family Medicine | Admitting: Family Medicine

## 2016-08-26 ENCOUNTER — Encounter (HOSPITAL_COMMUNITY): Payer: Self-pay | Admitting: Radiology

## 2016-08-26 ENCOUNTER — Emergency Department (HOSPITAL_COMMUNITY): Payer: Medicare Other

## 2016-08-26 DIAGNOSIS — Z888 Allergy status to other drugs, medicaments and biological substances status: Secondary | ICD-10-CM | POA: Diagnosis not present

## 2016-08-26 DIAGNOSIS — G8929 Other chronic pain: Secondary | ICD-10-CM | POA: Diagnosis not present

## 2016-08-26 DIAGNOSIS — E785 Hyperlipidemia, unspecified: Secondary | ICD-10-CM | POA: Insufficient documentation

## 2016-08-26 DIAGNOSIS — I340 Nonrheumatic mitral (valve) insufficiency: Secondary | ICD-10-CM | POA: Insufficient documentation

## 2016-08-26 DIAGNOSIS — G43109 Migraine with aura, not intractable, without status migrainosus: Secondary | ICD-10-CM | POA: Diagnosis present

## 2016-08-26 DIAGNOSIS — E876 Hypokalemia: Secondary | ICD-10-CM | POA: Insufficient documentation

## 2016-08-26 DIAGNOSIS — Z90711 Acquired absence of uterus with remaining cervical stump: Secondary | ICD-10-CM | POA: Insufficient documentation

## 2016-08-26 DIAGNOSIS — Z6830 Body mass index (BMI) 30.0-30.9, adult: Secondary | ICD-10-CM | POA: Diagnosis not present

## 2016-08-26 DIAGNOSIS — Z87891 Personal history of nicotine dependence: Secondary | ICD-10-CM | POA: Insufficient documentation

## 2016-08-26 DIAGNOSIS — R0789 Other chest pain: Secondary | ICD-10-CM | POA: Diagnosis not present

## 2016-08-26 DIAGNOSIS — Z885 Allergy status to narcotic agent status: Secondary | ICD-10-CM | POA: Diagnosis not present

## 2016-08-26 DIAGNOSIS — Z8673 Personal history of transient ischemic attack (TIA), and cerebral infarction without residual deficits: Secondary | ICD-10-CM

## 2016-08-26 DIAGNOSIS — I10 Essential (primary) hypertension: Secondary | ICD-10-CM | POA: Diagnosis present

## 2016-08-26 DIAGNOSIS — I712 Thoracic aortic aneurysm, without rupture: Secondary | ICD-10-CM | POA: Insufficient documentation

## 2016-08-26 DIAGNOSIS — Z7982 Long term (current) use of aspirin: Secondary | ICD-10-CM | POA: Diagnosis not present

## 2016-08-26 DIAGNOSIS — I639 Cerebral infarction, unspecified: Secondary | ICD-10-CM | POA: Diagnosis present

## 2016-08-26 DIAGNOSIS — E669 Obesity, unspecified: Secondary | ICD-10-CM | POA: Diagnosis not present

## 2016-08-26 DIAGNOSIS — I110 Hypertensive heart disease with heart failure: Secondary | ICD-10-CM | POA: Insufficient documentation

## 2016-08-26 DIAGNOSIS — I5032 Chronic diastolic (congestive) heart failure: Secondary | ICD-10-CM | POA: Diagnosis not present

## 2016-08-26 DIAGNOSIS — G459 Transient cerebral ischemic attack, unspecified: Secondary | ICD-10-CM | POA: Diagnosis not present

## 2016-08-26 DIAGNOSIS — E119 Type 2 diabetes mellitus without complications: Secondary | ICD-10-CM | POA: Diagnosis not present

## 2016-08-26 DIAGNOSIS — K219 Gastro-esophageal reflux disease without esophagitis: Secondary | ICD-10-CM | POA: Insufficient documentation

## 2016-08-26 DIAGNOSIS — I69354 Hemiplegia and hemiparesis following cerebral infarction affecting left non-dominant side: Secondary | ICD-10-CM | POA: Insufficient documentation

## 2016-08-26 DIAGNOSIS — G3189 Other specified degenerative diseases of nervous system: Secondary | ICD-10-CM | POA: Insufficient documentation

## 2016-08-26 DIAGNOSIS — E6609 Other obesity due to excess calories: Secondary | ICD-10-CM | POA: Diagnosis not present

## 2016-08-26 DIAGNOSIS — J329 Chronic sinusitis, unspecified: Secondary | ICD-10-CM

## 2016-08-26 DIAGNOSIS — R079 Chest pain, unspecified: Secondary | ICD-10-CM

## 2016-08-26 DIAGNOSIS — I7389 Other specified peripheral vascular diseases: Secondary | ICD-10-CM | POA: Insufficient documentation

## 2016-08-26 DIAGNOSIS — E7800 Pure hypercholesterolemia, unspecified: Secondary | ICD-10-CM

## 2016-08-26 HISTORY — DX: Cerebral infarction, unspecified: I63.9

## 2016-08-26 HISTORY — DX: Myoneural disorder, unspecified: G70.9

## 2016-08-26 LAB — COMPREHENSIVE METABOLIC PANEL
ALBUMIN: 3.5 g/dL (ref 3.5–5.0)
ALK PHOS: 78 U/L (ref 38–126)
ALT: 13 U/L — ABNORMAL LOW (ref 14–54)
ANION GAP: 12 (ref 5–15)
AST: 20 U/L (ref 15–41)
BILIRUBIN TOTAL: 0.3 mg/dL (ref 0.3–1.2)
BUN: 20 mg/dL (ref 6–20)
CALCIUM: 9.6 mg/dL (ref 8.9–10.3)
CO2: 20 mmol/L — AB (ref 22–32)
Chloride: 105 mmol/L (ref 101–111)
Creatinine, Ser: 1.23 mg/dL — ABNORMAL HIGH (ref 0.44–1.00)
GFR calc Af Amer: 50 mL/min — ABNORMAL LOW (ref >60)
GFR calc non Af Amer: 43 mL/min — ABNORMAL LOW (ref >60)
GLUCOSE: 212 mg/dL — AB (ref 65–99)
Potassium: 3.3 mmol/L — ABNORMAL LOW (ref 3.5–5.1)
SODIUM: 137 mmol/L (ref 135–145)
TOTAL PROTEIN: 6.9 g/dL (ref 6.5–8.1)

## 2016-08-26 LAB — RAPID URINE DRUG SCREEN, HOSP PERFORMED
AMPHETAMINES: NOT DETECTED
BARBITURATES: NOT DETECTED
Benzodiazepines: NOT DETECTED
COCAINE: NOT DETECTED
OPIATES: NOT DETECTED
TETRAHYDROCANNABINOL: NOT DETECTED

## 2016-08-26 LAB — URINALYSIS, ROUTINE W REFLEX MICROSCOPIC
BILIRUBIN URINE: NEGATIVE
GLUCOSE, UA: NEGATIVE mg/dL
Hgb urine dipstick: NEGATIVE
KETONES UR: NEGATIVE mg/dL
Leukocytes, UA: NEGATIVE
NITRITE: NEGATIVE
PH: 6.5 (ref 5.0–8.0)
PROTEIN: NEGATIVE mg/dL
Specific Gravity, Urine: <1.005 — ABNORMAL LOW (ref 1.005–1.030)

## 2016-08-26 LAB — GLUCOSE, CAPILLARY: Glucose-Capillary: 146 mg/dL — ABNORMAL HIGH (ref 65–99)

## 2016-08-26 LAB — DIFFERENTIAL
BASOS ABS: 0 10*3/uL (ref 0.0–0.1)
Basophils Relative: 0 %
EOS PCT: 2 %
Eosinophils Absolute: 0.1 10*3/uL (ref 0.0–0.7)
LYMPHS ABS: 3.7 10*3/uL (ref 0.7–4.0)
LYMPHS PCT: 52 %
Monocytes Absolute: 0.4 10*3/uL (ref 0.1–1.0)
Monocytes Relative: 5 %
NEUTROS ABS: 3 10*3/uL (ref 1.7–7.7)
NEUTROS PCT: 41 %

## 2016-08-26 LAB — CBC
HCT: 40.5 % (ref 36.0–46.0)
HEMOGLOBIN: 13.3 g/dL (ref 12.0–15.0)
MCH: 30.4 pg (ref 26.0–34.0)
MCHC: 32.8 g/dL (ref 30.0–36.0)
MCV: 92.7 fL (ref 78.0–100.0)
PLATELETS: 234 10*3/uL (ref 150–400)
RBC: 4.37 MIL/uL (ref 3.87–5.11)
RDW: 13.7 % (ref 11.5–15.5)
WBC: 7.3 10*3/uL (ref 4.0–10.5)

## 2016-08-26 LAB — I-STAT CHEM 8, ED
BUN: 22 mg/dL — AB (ref 6–20)
CHLORIDE: 104 mmol/L (ref 101–111)
CREATININE: 1.1 mg/dL — AB (ref 0.44–1.00)
Calcium, Ion: 1.05 mmol/L — ABNORMAL LOW (ref 1.15–1.40)
Glucose, Bld: 213 mg/dL — ABNORMAL HIGH (ref 65–99)
HEMATOCRIT: 41 % (ref 36.0–46.0)
Hemoglobin: 13.9 g/dL (ref 12.0–15.0)
POTASSIUM: 3.3 mmol/L — AB (ref 3.5–5.1)
Sodium: 140 mmol/L (ref 135–145)
TCO2: 19 mmol/L (ref 0–100)

## 2016-08-26 LAB — PROTIME-INR
INR: 1.01
PROTHROMBIN TIME: 13.3 s (ref 11.4–15.2)

## 2016-08-26 LAB — ETHANOL: Alcohol, Ethyl (B): <5 mg/dL (ref <5)

## 2016-08-26 LAB — I-STAT TROPONIN, ED: Troponin i, poc: 0 ng/mL (ref 0.00–0.08)

## 2016-08-26 LAB — APTT: APTT: 31 s (ref 24–36)

## 2016-08-26 MED ORDER — ASPIRIN 300 MG RE SUPP: 300.0000 mg | Freq: Every day | RECTAL | Status: DC

## 2016-08-26 MED ORDER — ACETAMINOPHEN 650 MG RE SUPP: 650.0000 mg | RECTAL | Status: DC | PRN

## 2016-08-26 MED ORDER — STROKE: EARLY STAGES OF RECOVERY BOOK: Freq: Once | Status: DC

## 2016-08-26 MED ORDER — POTASSIUM CHLORIDE IN NACL 20-0.9 MEQ/L-% IV SOLN
INTRAVENOUS | Status: DC
  Administered 2016-08-26 – 2016-08-28 (×5): via INTRAVENOUS
  Filled 2016-08-26 (×6): qty 1000

## 2016-08-26 MED ORDER — INSULIN ASPART 100 UNIT/ML ~~LOC~~ SOLN
0.0000 [IU] | SUBCUTANEOUS | Status: DC
  Administered 2016-08-27: 5 [IU] via SUBCUTANEOUS
  Administered 2016-08-27: 3 [IU] via SUBCUTANEOUS
  Administered 2016-08-27 (×2): 2 [IU] via SUBCUTANEOUS
  Administered 2016-08-28 (×2): 3 [IU] via SUBCUTANEOUS
  Administered 2016-08-28: 2 [IU] via SUBCUTANEOUS
  Administered 2016-08-29: 5 [IU] via SUBCUTANEOUS
  Administered 2016-08-29 (×2): 2 [IU] via SUBCUTANEOUS

## 2016-08-26 MED ORDER — PANTOPRAZOLE SODIUM 40 MG PO TBEC
40.0000 mg | DELAYED_RELEASE_TABLET | Freq: Every day | ORAL | Status: DC
  Administered 2016-08-27 – 2016-08-29 (×3): 40 mg via ORAL
  Filled 2016-08-26 (×3): qty 1

## 2016-08-26 MED ORDER — ACETAMINOPHEN 325 MG PO TABS: 650.0000 mg | ORAL_TABLET | ORAL | Status: DC | PRN

## 2016-08-26 MED ORDER — ASPIRIN 325 MG PO TABS
325.0000 mg | ORAL_TABLET | Freq: Every day | ORAL | Status: DC
  Administered 2016-08-27 – 2016-08-29 (×3): 325 mg via ORAL
  Filled 2016-08-26 (×3): qty 1

## 2016-08-26 MED ORDER — ENOXAPARIN SODIUM 40 MG/0.4ML ~~LOC~~ SOLN
40.0000 mg | SUBCUTANEOUS | Status: DC
  Administered 2016-08-27 – 2016-08-29 (×3): 40 mg via SUBCUTANEOUS
  Filled 2016-08-26 (×3): qty 0.4

## 2016-08-26 MED ORDER — METOPROLOL SUCCINATE ER 25 MG PO TB24
50.0000 mg | ORAL_TABLET | Freq: Every day | ORAL | Status: DC
  Administered 2016-08-29: 50 mg via ORAL
  Filled 2016-08-26 (×3): qty 2

## 2016-08-26 MED ORDER — MORPHINE SULFATE (PF) 2 MG/ML IV SOLN
2.0000 mg | Freq: Once | INTRAVENOUS | Status: AC
  Administered 2016-08-26: 2 mg via INTRAVENOUS
  Filled 2016-08-26: qty 1

## 2016-08-26 MED ORDER — ATORVASTATIN CALCIUM 10 MG PO TABS: 10.0000 mg | ORAL_TABLET | Freq: Every day | ORAL | Status: DC

## 2016-08-26 NOTE — Consult Note (Signed)
Admission H&P    Chief Complaint: New onset left-sided weakness.  HPI: [name] [name] is an 70 y.o. female with a history of diabetes mellitus, hypertension and hyperlipidemia as well as TIA in January 2017, presenting with recurrent acute left-sided weakness. Onset was at 1:00 PM today. She's also complaining of right-sided headache. She's noted changes in her speech as well. CT scan of the head showed no acute intracranial abnormality. Workup in January, including MRI of the brain did not show acute stroke. She's been taking aspirin daily. NIH stroke score was 12.  LSN: 1:00 PM on 08/26/2016 tPA Given: No: Beyond time under for treatment consideration mRankin:  Past Medical History:  Diagnosis Date  . Chest pain 2009   MV with no scar or ischemia, EF 65%  . Diabetes mellitus   . GERD (gastroesophageal reflux disease)   . History of cardiac catheterization    a. Myoview 2/15 with anteroapical ischemia, EF 64% (intermediate risk) >> LHC - normal cos, EF 55-60%  . Hypertension   . Obesity (BMI 35.0-39.9 without comorbidity)   . Pneumonia   . Thoracic ascending aortic aneurysm (HCC)    a. CT 1/17: ascending aorta 4.2 x 4.0 cm    Past Surgical History:  Procedure Laterality Date  . ABDOMINAL HYSTERECTOMY  2006   Partial, one ovary left   . [place]  . CHOLECYSTECTOMY    . LEFT HEART CATHETERIZATION WITH CORONARY ANGIOGRAM N/A 01/09/2014   Procedure: LEFT HEART CATHETERIZATION WITH CORONARY ANGIOGRAM;  Surgeon: Marykay Lexavid W Harding, MD;  [facility];  Service: Cardiovascular;  Laterality: N/A;  . TEMPOROMANDIBULAR JOINT SURGERY    . WISDOM TOOTH EXTRACTION      Family History  Problem Relation Age of Onset  . Heart disease Father   . Pancreatitis Mother   . Diabetes Maternal Grandmother    Social History:  reports that she quit smoking about 47 years ago. Her smoking use included Cigarettes. She has a 0.50 pack-year  smoking history. She has never used smokeless tobacco. She reports that she does not drink alcohol or use drugs.  Allergies:  Allergies  Allergen Reactions  . Codeine Nausea And Vomiting  . Adhesive [Tape] Rash    pls use elastic wraps instead of adhesive tape    Medications: Preadmission medications were reviewed by me.  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, scheduled for surgery 08/28/2016 Allergy and Immunology ROS: negative for - hives or itchy/watery eyes Hematological and Lymphatic ROS: negative for - bleeding problems, bruising or swollen lymph nodes Endocrine ROS: negative for - galactorrhea, hair pattern changes, polydipsia/polyuria or temperature intolerance Respiratory ROS: negative for - cough, hemoptysis, shortness of breath or wheezing Cardiovascular ROS: negative for - chest pain, dyspnea on exertion, edema or irregular heartbeat Gastrointestinal ROS: negative for - abdominal pain, diarrhea, hematemesis, nausea/vomiting or stool incontinence Genito-Urinary ROS: negative for - dysuria, hematuria, incontinence or urinary frequency/urgency Musculoskeletal ROS: negative for - joint swelling or muscular weakness Neurological ROS: as noted in HPI Dermatological ROS: negative for rash and skin lesion changes  Physical Examination: Blood pressure 176/91, pulse 68, temperature 98 F (36.7 C), temperature  source Oral, resp. rate 18, height 5\' 6"  (1.676 m), weight 85 kg (187 lb 6.3 oz), SpO2 97 %.  HEENT-  Normocephalic, no lesions, without obvious abnormality.  Normal external eye and conjunctiva.  Normal TM's bilaterally.  Normal auditory  canals and external ears. Normal external nose, mucus membranes and septum.  Normal pharynx. Neck supple with no masses, nodes, nodules or enlargement. Cardiovascular - regular rate and rhythm, S1, S2 normal, no murmur, click, rub or gallop Lungs - chest clear, no wheezing, rales, normal symmetric air entry Abdomen - soft, non-tender; bowel sounds normal; no masses,  no organomegaly Extremities - no joint deformities, effusion, or inflammation  Neurologic Examination: Mental Status: Alert, oriented, anxious and tearful.  Speech slightly slurred with stuttering quality, and inconsistent. No evidence of aphasia. Able to follow commands without difficulty. Cranial Nerves: II-Visual fields were normal. III/IV/VI-Pupils were equal and reacted normally to light. Extraocular movements were full and conjugate.    V/VII-reduced perception tactile sensation of left-sided face compared to the right; equivocal left lower facial weakness, which was inconsistent. VIII-normal. X-slightly dysarthric speech, although suspicious for emellishment. XI: trapezius strength/neck flexion strength normal bilaterally XII-midline tongue extension with normal strength. Motor: Poor effort with testing of left extremities; cannot rule out mild to moderate weakness; muscle tone was flaccid throughout; strength of right extremities was normal. Sensory: Reduced perception of tactile sensation over left extremities compared to right extremities. Deep Tendon Reflexes: 1+ and symmetric. Plantars: Mute bilaterally Cerebellar: Normal finger-to-nose testing with use of right upper extremity.  Results for orders placed or performed during the hospital encounter of 08/26/16 (from the past 48 hour(s))  CBC     Status: None   Collection Time: 08/26/16  7:37 PM  Result Value Ref Range   WBC 7.3 4.0 - 10.5 K/uL   RBC 4.37 3.87 - 5.11 MIL/uL   Hemoglobin 13.3 12.0 - 15.0 g/dL   HCT 40.9 81.1 - 91.4 %   MCV 92.7 78.0 - 100.0 fL    MCH 30.4 26.0 - 34.0 pg   MCHC 32.8 30.0 - 36.0 g/dL   RDW 78.2 95.6 - 21.3 %   Platelets 234 150 - 400 K/uL  Differential     Status: None   Collection Time: 08/26/16  7:37 PM  Result Value Ref Range   Neutrophils Relative % 41 %   Neutro Abs 3.0 1.7 - 7.7 K/uL   Lymphocytes Relative 52 %   Lymphs Abs 3.7 0.7 - 4.0 K/uL   Monocytes Relative 5 %   Monocytes Absolute 0.4 0.1 - 1.0 K/uL   Eosinophils Relative 2 %   Eosinophils Absolute 0.1 0.0 - 0.7 K/uL   Basophils Relative 0 %   Basophils Absolute 0.0 0.0 - 0.1 K/uL  I-Stat Chem 8, ED  (not at [facility], [facility])     Status: Abnormal   Collection Time: 08/26/16  7:48 PM  Result Value Ref Range   Sodium 140 135 - 145 mmol/L   Potassium 3.3 (L) 3.5 - 5.1 mmol/L   Chloride 104 101 - 111 mmol/L   BUN 22 (H) 6 - 20 mg/dL   Creatinine, Ser 0.86 (H) 0.44 - 1.00 mg/dL   Glucose, Bld 578 (H) 65 - 99 mg/dL   Calcium, Ion 4.69 (L) 1.15 - 1.40 mmol/L   TCO2 19 0 - 100 mmol/L   Hemoglobin 13.9 12.0 - 15.0 g/dL   HCT 62.9 52.8 - 41.3 %   Ct Head Code Stroke W/o Cm  Result Date: 08/26/2016  CLINICAL DATA:  Code stroke. Left-sided weakness, headache, and slurred speech. History of stroke. EXAM: CT HEAD WITHOUT CONTRAST TECHNIQUE: Contiguous axial images were obtained from the base of the skull through the vertex without intravenous contrast. COMPARISON:  Head CT and MRI 12/07/2015 FINDINGS: Brain: There is no evidence of acute cortical infarct, intracranial hemorrhage, mass, midline shift, or extra-axial fluid collection. Ventricles and sulci are within normal limits for age. Vascular: No hyperdense vessel. Minimal carotid siphon calcification. Skull: No fracture or focal osseous lesion. Sinuses/Orbits: Partially visualized chronic right maxillary sinusitis with suspected fluid component as well. Clear mastoid air cells. Unremarkable orbits. Other: None. ASPECTS [facility] Stroke Program Early CT Score) - Ganglionic level infarction (caudate, lentiform nuclei,  internal capsule, insula, M1-M3 cortex): 7 - Supraganglionic infarction (M4-M6 cortex): 3 Total score (0-10 with 10 being normal): 10 IMPRESSION: 1. No evidence of acute intracranial abnormality. 2. ASPECTS is 10. These results were called by telephone at the time of interpretation on 08/26/2016 at 7:56 pm to Dr. Roseanne Reno, who verbally acknowledged these results. Electronically Signed   By: Sebastian Ache M.D.   On: 08/26/2016 19:58    Assessment: 70 y.o. female with multiple risk factors for stroke presenting with possible recurrent transient ischemic attack. Small vessel right subcortical CVA cannot be ruled out at this point. There appear to be significant significant psychophysiologic factors contributing to this patient's symptomatology, as well.  Stroke Risk Factors - diabetes mellitus, hyperlipidemia and hypertension  Plan: 1. HgbA1c, fasting lipid panel 2. MRI, MRA  of the brain without contrast 3. PT consult, OT consult, Speech consult 4. Carotid dopplers 5. Prophylactic therapy-Antiplatelet med: Aspirin 6. Risk factor modification 7. Telemetry monitoring  C.R. Roseanne Reno, MD [facility] [phone]  08/26/2016, 8:07 PM

## 2016-08-26 NOTE — ED Triage Notes (Signed)
Pt presents to the ed with ems for stroke like symptoms, the patient started having increasingly left sided weakness and slurred speech at 1300 and then had a sudden headache right before ems arrival, code stroke called and patient taken straight to ct, patient had a stroke in Jan and also has a history of an aortic aneurysm.  The patient is at her baseline neurologically and does have some left sided deficit from previous stroke, per family this is worse

## 2016-08-26 NOTE — ED Provider Notes (Signed)
[facility]-EMERGENCY DEPT Provider Note   CSN: [id number] Arrival date & time: 08/26/16  4098   An emergency department physician performed an initial assessment on this suspected stroke patient at 60.  History   Chief Complaint Chief Complaint  Patient presents with  . Code Stroke    HPI [place] L Boyar is a 70 y.o. female.  Patient w hx dm, htn, ?prior cva, c/o increased left sided weakness and slurred speech since approximately 1 PM today. Symptoms persistent, constant, no specific exacerbating or alleviating factors. At some point later in the afternoon, pt notes gradual onset frontal headache, c/w prior headaches. No neck stiffness or rigidity. No fever or chills. Pt denies trouble breathing or swallowing. Denies change in vision. Compliant w normal meds.         Past Medical History:  Diagnosis Date  . Chest pain 2009   MV with no scar or ischemia, EF 65%  . Diabetes mellitus   . GERD (gastroesophageal reflux disease)   . History of cardiac catheterization    a. Myoview 2/15 with anteroapical ischemia, EF 64% (intermediate risk) >> LHC - normal cos, EF 55-60%  . Hypertension   . Obesity (BMI 35.0-39.9 without comorbidity)   . Pneumonia   . Thoracic ascending aortic aneurysm (HCC)    a. CT 1/17: ascending aorta 4.2 x 4.0 cm    Patient Active Problem List   Diagnosis Date Noted  . Chronic diastolic heart failure, NYHA class 2 (HCC) 12/07/2015  . Pulmonary HTN 12/07/2015  . Chronic chest pain 12/07/2015  . Thoracic aortic aneurysm (HCC) 12/07/2015  . Left sided numbness 12/07/2015  . Diabetes mellitus with complication (HCC)   . Bradycardia 05/06/2012  . Hypokalemia 05/06/2012  . Acute left-sided weakness 05/05/2012  . CVA (cerebral vascular accident) (HCC) 05/05/2012  . Chest pain 05/05/2012  . Obesity (BMI 35.0-39.9 without comorbidity) (HCC) 04/25/2012  . Cystocele 03/22/2012  . Diabetes mellitus, type 2 (HCC) 03/22/2012  . Hypertension 03/22/2012  . gerd  03/22/2012    Past Surgical History:  Procedure Laterality Date  . ABDOMINAL HYSTERECTOMY  2006   Partial, one ovary left   . CARDIAC CATHETERIZATION  2004   [place] per pt.  . CHOLECYSTECTOMY    . LEFT HEART CATHETERIZATION WITH CORONARY ANGIOGRAM N/A 01/09/2014   Procedure: LEFT HEART CATHETERIZATION WITH CORONARY ANGIOGRAM;  Surgeon: Marykay Lex, MD;  Location: [facility] CATH LAB;  Service: Cardiovascular;  Laterality: N/A;  . TEMPOROMANDIBULAR JOINT SURGERY    . WISDOM TOOTH EXTRACTION      OB History    Gravida Para Term Preterm AB Living   4 4 4  0 0 4   SAB TAB Ectopic Multiple Live Births   0 0 0 0        Obstetric Comments   Delivered 4 large babies vaginally       Home Medications    Prior to Admission medications   Medication Sig Start Date End Date Taking? Authorizing Provider  albuterol (PROVENTIL HFA;VENTOLIN HFA) 108 (90 BASE) MCG/ACT inhaler Inhale 1-2 puffs into the lungs every 6 (six) hours as needed for wheezing. 07/29/13   Reuben Likes, MD  aspirin 325 MG tablet Take 1 tablet (325 mg total) by mouth daily. 12/09/15   Ripudeep Jenna Luo, MD  atorvastatin (LIPITOR) 10 MG tablet Take 1 tablet (10 mg total) by mouth daily at 6 PM. 12/09/15   Ripudeep Jenna Luo, MD  ESTRACE VAGINAL 0.1 MG/GM vaginal cream INSERT 1 APPLICATORFUL VAGINALLY  2 TIMES PER WEEK 12/27/15   Allie Bossier, MD  glipiZIDE (GLUCOTROL) 5 MG tablet Take 5 mg by mouth 2 (two) times daily before a meal.     Historical Provider, MD  HYDROcodone-acetaminophen (NORCO/VICODIN) 5-325 MG tablet Take 1 tablet by mouth every 6 (six) hours as needed for moderate pain or severe pain. 11/25/15   Lavera Guise, MD  ibuprofen (ADVIL,MOTRIN) 200 MG tablet Take 200 mg by mouth every 6 (six) hours as needed for headache or mild pain.    Historical Provider, MD  lisinopril-hydrochlorothiazide (PRINZIDE,ZESTORETIC) 10-12.5 MG tablet TAKE 1 TABLET BY MOUTH DAILY 12/27/15   Allie Bossier, MD  metoprolol succinate (TOPROL-XL) 50  MG 24 hr tablet TAKE 1 TABLET BY MOUTH DAILY( WITH OR IMMEDIATELY FOLLOWING A MEAL) 06/19/16   Beatrice Lecher, PA-C  omeprazole (PRILOSEC) 40 MG capsule Take 40 mg by mouth daily.    Historical Provider, MD  ONE TOUCH ULTRA TEST test strip 1 each by Other route as needed (BLOOD GLUCOSE MONITORING).  10/20/15   Historical Provider, MD  potassium chloride SA (K-DUR,KLOR-CON) 20 MEQ tablet Take 1 tablet (20 mEq total) by mouth daily. 12/03/15   Beatrice Lecher, PA-C    Family History Family History  Problem Relation Age of Onset  . Heart disease Father   . Pancreatitis Mother   . Diabetes Maternal Grandmother     Social History Social History  Substance Use Topics  . Smoking status: Former Smoker    Packs/day: 1.00    Years: 0.50    Types: Cigarettes    Quit date: 05/05/1969  . Smokeless tobacco: Never Used  . Alcohol use No     Allergies   Codeine and Adhesive [tape]   Review of Systems Review of Systems  Constitutional: Negative for fever.  HENT: Negative for trouble swallowing.   Eyes: Negative for redness.  Respiratory: Negative for shortness of breath.   Cardiovascular: Negative for chest pain.  Gastrointestinal: Negative for abdominal pain and vomiting.  Genitourinary: Negative for flank pain.  Musculoskeletal: Negative for neck pain and neck stiffness.  Skin: Negative for rash.  Neurological: Positive for weakness and headaches.  Hematological: Does not bruise/bleed easily.  Psychiatric/Behavioral: Negative for confusion.     Physical Exam Updated Vital Signs BP 176/91   Pulse 68   Temp 98 F (36.7 C) (Oral)   Resp 18   Ht 5\' 6"  (1.676 m)   Wt 85 kg   SpO2 97%   BMI 30.25 kg/m   Physical Exam  Constitutional: She appears well-developed and well-nourished. No distress.  HENT:  Head: Atraumatic.  Nose: Nose normal.  Mouth/Throat: Oropharynx is clear and moist.  No sinus or temporal tenderness.  Eyes: Conjunctivae and EOM are normal. Pupils are equal,  round, and reactive to light. No scleral icterus.  Neck: Neck supple. No tracheal deviation present. No thyromegaly present.  No stiffness or rigidity.  No bruits.   Cardiovascular: Normal rate, regular rhythm, normal heart sounds and intact distal pulses.  Exam reveals no gallop and no friction rub.   No murmur heard. Pulmonary/Chest: Effort normal and breath sounds normal. No respiratory distress.  Abdominal: Soft. Normal appearance and bowel sounds are normal. She exhibits no distension. There is no tenderness.  Genitourinary:  Genitourinary Comments: No cva tenderness.  Musculoskeletal: Normal range of motion. She exhibits no edema or tenderness.  Neurological: She is alert.  Left upper ext weakness > left lower extremity weakness. ?effort esp on LUE exam.  Speech w odd quality, high pitched, ?slurred.   Skin: Skin is warm and dry. No rash noted. She is not diaphoretic.  Psychiatric: She has a normal mood and affect.  Nursing note and vitals reviewed.    ED Treatments / Results  Labs (all labs ordered are listed, but only abnormal results are displayed) Results for orders placed or performed during the hospital encounter of 08/26/16  Protime-INR  Result Value Ref Range   Prothrombin Time 13.3 11.4 - 15.2 seconds   INR 1.01   APTT  Result Value Ref Range   aPTT 31 24 - 36 seconds  CBC  Result Value Ref Range   WBC 7.3 4.0 - 10.5 K/uL   RBC 4.37 3.87 - 5.11 MIL/uL   Hemoglobin 13.3 12.0 - 15.0 g/dL   HCT 96.040.5 45.436.0 - 09.846.0 %   MCV 92.7 78.0 - 100.0 fL   MCH 30.4 26.0 - 34.0 pg   MCHC 32.8 30.0 - 36.0 g/dL   RDW 11.913.7 14.711.5 - 82.915.5 %   Platelets 234 150 - 400 K/uL  Differential  Result Value Ref Range   Neutrophils Relative % 41 %   Neutro Abs 3.0 1.7 - 7.7 K/uL   Lymphocytes Relative 52 %   Lymphs Abs 3.7 0.7 - 4.0 K/uL   Monocytes Relative 5 %   Monocytes Absolute 0.4 0.1 - 1.0 K/uL   Eosinophils Relative 2 %   Eosinophils Absolute 0.1 0.0 - 0.7 K/uL   Basophils  Relative 0 %   Basophils Absolute 0.0 0.0 - 0.1 K/uL  I-Stat Chem 8, ED  ([facility] at [facility])  Result Value Ref Range   Sodium 140 135 - 145 mmol/L   Potassium 3.3 (L) 3.5 - 5.1 mmol/L   Chloride 104 101 - 111 mmol/L   BUN 22 (H) 6 - 20 mg/dL   Creatinine, Ser 5.621.10 (H) 0.44 - 1.00 mg/dL   Glucose, Bld 130213 (H) 65 - 99 mg/dL   Calcium, Ion 8.651.05 (L) 1.15 - 1.40 mmol/L   TCO2 19 0 - 100 mmol/L   Hemoglobin 13.9 12.0 - 15.0 g/dL   HCT 78.441.0 69.636.0 - 29.546.0 %   Ct Head Code Stroke W/o Cm  Result Date: 08/26/2016 CLINICAL DATA:  Code stroke. Left-sided weakness, headache, and slurred speech. History of stroke. EXAM: CT HEAD WITHOUT CONTRAST TECHNIQUE: Contiguous axial images were obtained from the base of the skull through the vertex without intravenous contrast. COMPARISON:  Head CT and MRI 12/07/2015 FINDINGS: Brain: There is no evidence of acute cortical infarct, intracranial hemorrhage, mass, midline shift, or extra-axial fluid collection. Ventricles and sulci are within normal limits for age. Vascular: No hyperdense vessel. Minimal carotid siphon calcification. Skull: No fracture or focal osseous lesion. Sinuses/Orbits: Partially visualized chronic right maxillary sinusitis with suspected fluid component as well. Clear mastoid air cells. Unremarkable orbits. Other: None. ASPECTS [facility]([place] Stroke Program Early CT Score) - Ganglionic level infarction (caudate, lentiform nuclei, internal capsule, insula, M1-M3 cortex): 7 - Supraganglionic infarction (M4-M6 cortex): 3 Total score (0-10 with 10 being normal): 10 IMPRESSION: 1. No evidence of acute intracranial abnormality. 2. ASPECTS is 10. These results were called by telephone at the time of interpretation on 08/26/2016 at 7:56 pm to Dr. Roseanne RenoStewart, who verbally acknowledged these results. Electronically Signed   By: Sebastian AcheAllen  Grady M.D.   On: 08/26/2016 19:58    EKG  EKG Interpretation None       Radiology Ct Head Code Stroke W/o Cm  Result Date:  08/26/2016 CLINICAL DATA:  Code stroke. Left-sided weakness, headache, and slurred speech. History of stroke. EXAM: CT HEAD WITHOUT CONTRAST TECHNIQUE: Contiguous axial images were obtained from the base of the skull through the vertex without intravenous contrast. COMPARISON:  Head CT and MRI 12/07/2015 FINDINGS: Brain: There is no evidence of acute cortical infarct, intracranial hemorrhage, mass, midline shift, or extra-axial fluid collection. Ventricles and sulci are within normal limits for age. Vascular: No hyperdense vessel. Minimal carotid siphon calcification. Skull: No fracture or focal osseous lesion. Sinuses/Orbits: Partially visualized chronic right maxillary sinusitis with suspected fluid component as well. Clear mastoid air cells. Unremarkable orbits. Other: None. ASPECTS [facility] Stroke Program Early CT Score) - Ganglionic level infarction (caudate, lentiform nuclei, internal capsule, insula, M1-M3 cortex): 7 - Supraganglionic infarction (M4-M6 cortex): 3 Total score (0-10 with 10 being normal): 10 IMPRESSION: 1. No evidence of acute intracranial abnormality. 2. ASPECTS is 10. These results were called by telephone at the time of interpretation on 08/26/2016 at 7:56 pm to Dr. Roseanne Reno, who verbally acknowledged these results. Electronically Signed   By: Sebastian Ache M.D.   On: 08/26/2016 19:58    Procedures Procedures (including critical care time)  Medications Ordered in ED Medications - No data to display   Initial Impression / Assessment and Plan / ED Course  I have reviewed the triage vital signs and the nursing notes.  Pertinent labs & imaging results that were available during my care of the patient were reviewed by me and considered in my medical decision making (see chart for details).  Clinical Course    Iv ns. Continuous pulse ox and monitor. Labs. Stat ct.  Neurology, Dr Roseanne Reno, evaluated patient - indicates not candidate for tpa or other acute intervention, and  recommends admission to medical service.   Recheck pt - no change in exam from prior.     Final Clinical Impressions(s) / ED Diagnoses   Final diagnoses:  None    New Prescriptions New Prescriptions   No medications on file     Cathren Laine, MD 08/26/16 2013

## 2016-08-26 NOTE — Progress Notes (Signed)
Code stroke called on 70 y.o female. Pertinent history includes TIA in January 2017 negative for stroke per MRI, left side weakness, diabetes mellitus, hypertension and hyperlipidemia. LSN per family at 0100. Through out the day she was witnessed with progressive discoordination and speech slurring per family. She also complained of right sided head ache. EMS called to home [facility] STAT labs and CT scan obtained. CT reviewed per Neurologist Dr. Roseanne RenoStewart with no acute findings. NIHSS completed yielding 12, scored mostly with old deficits to left side, see neuro flowsheet for specifics. Code stroke canceled due to Pt being well outside of window, not a candidate for TPA. For admit to hospital tonight.

## 2016-08-26 NOTE — ED Notes (Signed)
Pt requested water, pt was informed that she was unable to have water due to her hx of dysphagia, pt given moist mouth swab for comfort. Pt tolerated well.

## 2016-08-26 NOTE — ED Notes (Signed)
Pt presented with left sided weakness, slurred speech, headache and numbness on the left side. Pt has a hx of left sided weakness from previous stroke, however this is worse. Pt is a&o and at her baseline, family at bedside.

## 2016-08-26 NOTE — ED Notes (Signed)
MD at bedside. 

## 2016-08-26 NOTE — H&P (Signed)
History and Physical  Patient Name: [name] [name]     ZOX:[id number]    DOB: 05/07/1946    DOA: 08/26/2016 PCP: Verlon Au, MD   Patient coming from: Home     Chief Complaint: Slurred speech  HPI: [name] [name] is a 70 y.o. female with a past medical history significant for NIDDM, HTN, and previous stroke with mild residual L sided weakness who presents with slurred speech.  The patient was in her usual state of health until today.  Per son, at the bedside, he was with her until about 1PM, and she was laughing and her normal self.  He lives across the street and spoke with her a few times by phone after that and she was normal, including and up until around [place] when he spoke to his nephew and could hear her in the background talking normally, and the nephew said she had just pulled out of the oven a few pies she had just made and he should come over.  About fifteen minutes after that (between 6-6:30PM) the nephew called back that there was something wrong with the patient "her neck hurts".  He came back over and found her slurred speech, struggling to talk, and appearing very anxious and panicky.  9-1-1 were called and they found her with left sided weakness and activated CODE STROKE.  ED course: -Afebrile, heart rate 60s, respirations and pulse oximetry normal, blood pressure 176/91 -Na 137, K 3.3, Cr 1.23 (baseline 0.9-1.0), WBC 7.3K, Hgb 13.3 -Troponin negative, alcohol negative, coags normal -CT of the head showed no intracranial process. -Initial report from EMS was that the patient's last normal was 1PM and so she was judged outside the window and tPA was not administered and TRH were asked to evaluate for admission for suspected TIA vs stroke     Review of systems:  Review of Systems  Neurological: Positive for sensory change, speech change, focal weakness and headaches (right sided). Negative for dizziness, tingling, tremors, seizures and loss of consciousness.    All other systems reviewed and are negative.        Past Medical History:  Diagnosis Date  . Chest pain 2009   MV with no scar or ischemia, EF 65%  . Diabetes mellitus   . GERD (gastroesophageal reflux disease)   . History of cardiac catheterization    a. Myoview 2/15 with anteroapical ischemia, EF 64% (intermediate risk) >> LHC - normal cos, EF 55-60%  . Hypertension   . Obesity (BMI 35.0-39.9 without comorbidity)   . Pneumonia   . Thoracic ascending aortic aneurysm (HCC)    a. CT 1/17: ascending aorta 4.2 x 4.0 cm    Past Surgical History:  Procedure Laterality Date  . ABDOMINAL HYSTERECTOMY  2006   Partial, one ovary left   . CARDIAC CATHETERIZATION  2004   [place] per pt.  . CHOLECYSTECTOMY    . LEFT HEART CATHETERIZATION WITH CORONARY ANGIOGRAM N/A 01/09/2014   Procedure: LEFT HEART CATHETERIZATION WITH CORONARY ANGIOGRAM;  Surgeon: Marykay Lex, MD;  Location: [facility] CATH LAB;  Service: Cardiovascular;  Laterality: N/A;  . TEMPOROMANDIBULAR JOINT SURGERY    . WISDOM TOOTH EXTRACTION      Social History: Patient lives by herself.  Patient walks unassisted.  She is a remote former smoker.  She is from [place], [place] originally.  She is independent with all ADLs at baseline  Allergies  Allergen Reactions  . Codeine Nausea And Vomiting  . Adhesive [Tape]  Rash    pls use elastic wraps instead of adhesive tape    Family history: family history includes Diabetes in her maternal grandmother; Heart disease in her father; Pancreatitis in her mother.  Prior to Admission medications   Medication Sig Start Date End Date Taking? Authorizing Provider  albuterol (PROVENTIL HFA;VENTOLIN HFA) 108 (90 BASE) MCG/ACT inhaler Inhale 1-2 puffs into the lungs every 6 (six) hours as needed for wheezing. 07/29/13  Yes Reuben Likes, MD  aspirin 325 MG tablet Take 1 tablet (325 mg total) by mouth daily. 12/09/15  Yes Ripudeep Jenna Luo, MD  atorvastatin (LIPITOR) 10 MG tablet Take 1  tablet (10 mg total) by mouth daily at 6 PM. 12/09/15  Yes Ripudeep K Rai, MD  Cholecalciferol (VITAMIN D3) 1000 units CAPS Take 1 capsule by mouth daily.   Yes Historical Provider, MD  glipiZIDE (GLUCOTROL) 5 MG tablet Take 5 mg by mouth 2 (two) times daily before a meal.    Yes Historical Provider, MD  lisinopril-hydrochlorothiazide (PRINZIDE,ZESTORETIC) 10-12.5 MG tablet TAKE 1 TABLET BY MOUTH DAILY 12/27/15  Yes Allie Bossier, MD  metoprolol succinate (TOPROL-XL) 50 MG 24 hr tablet TAKE 1 TABLET BY MOUTH DAILY( WITH OR IMMEDIATELY FOLLOWING A MEAL) 06/19/16  Yes Scott T Alben Spittle, PA-C  omeprazole (PRILOSEC) 40 MG capsule Take 40 mg by mouth daily.   Yes Historical Provider, MD  potassium chloride SA (K-DUR,KLOR-CON) 20 MEQ tablet Take 1 tablet (20 mEq total) by mouth daily. 12/03/15  Yes Scott Moishe Spice, PA-C  spironolactone (ALDACTONE) 25 MG tablet Take 12.5 mg by mouth daily.   Yes Historical Provider, MD  ESTRACE VAGINAL 0.1 MG/GM vaginal cream INSERT 1 APPLICATORFUL VAGINALLY 2 TIMES PER WEEK Patient not taking: Reported on 08/26/2016 12/27/15   Allie Bossier, MD  HYDROcodone-acetaminophen (NORCO/VICODIN) 5-325 MG tablet Take 1 tablet by mouth every 6 (six) hours as needed for moderate pain or severe pain. Patient not taking: Reported on 08/26/2016 11/25/15   Lavera Guise, MD  ibuprofen (ADVIL,MOTRIN) 200 MG tablet Take 200 mg by mouth every 6 (six) hours as needed for headache or mild pain.    Historical Provider, MD  ONE TOUCH ULTRA TEST test strip 1 each by Other route as needed (BLOOD GLUCOSE MONITORING).  10/20/15   Historical Provider, MD     Physical Exam: BP 136/73 (BP Location: Left Arm)   Pulse (!) 57   Temp 98.5 F (36.9 C) (Oral)   Resp 18   Ht 5\' 6"  (1.676 m)   Wt 85 kg (187 lb 6.3 oz)   SpO2 100%   BMI 30.25 kg/m  General appearance: Well-developed, elderly adult female, alert and in no acute distress.   Eyes: Anicteric, conjunctiva pink, lids and lashes normal. PERRL.    ENT: No  nasal deformity, discharge, epistaxis.  Hearing normal. OP moist without lesions.   Dentition normal. Lymph: No cervical, supraclavicular or axillary lymphadenopathy. Skin: Warm and dry.  No jaundice.  No suspicious rashes or lesions. Cardiac: RRR, nl S1-S2, no murmurs appreciated.  Capillary refill is brisk.  JVP normal.  No LE edema.  Radial and DP pulses 2+ and symmetric.  No carotid bruits. Respiratory: Normal respiratory rate and rhythm.  CTAB without rales or wheezes. GI: Abdomen soft without rigidity.  No TTP. No ascites, distension, no hepatosplenomegaly.   MSK: No deformities or effusions. Neuro: Pupils are 4 mm and reactive to 3 mm. Extraocular movements are intact, without nystagmus. Slow pursuits noted.  Cranial nerve 5  is within normal limits. Cranial nerve 7 has slight left droop. Cranial nerve 8 is within normal limits. Speech is slurred, departure from patient's normal.  Cranial nerve 11 reveals sternocleidomastoid strong to right, weak to left, ?volitional. Cranial nerve 12 deviates to right. Patient unable to lift or maintain left UE or LE against gravity.  4/5 and ?poor effort on right lower extremity, 5/5 in RUE.  Normal muscle tone and bulk. Sensation diminished to pinprick, cold and light touch on left side relative to right.  Naming is grossly intact. Attention span and concentration are within normal limits.   Psych: The patient is oriented to time, place and person. Behavior appropriate.  Affect normal.  Recall, recent and remote, as well as general fund of knowledge seem within normal limits. No evidence of aural or visual hallucinations or delusions.       Labs on Admission:  I have personally reviewed following labs and imaging studies: CBC:  Recent Labs Lab 08/26/16 1937 08/26/16 1948  WBC 7.3  --   NEUTROABS 3.0  --   HGB 13.3 13.9  HCT 40.5 41.0  MCV 92.7  --   PLT 234  --    Basic Metabolic Panel:  Recent Labs Lab 08/26/16 1937 08/26/16 1948    NA 137 140  K 3.3* 3.3*  CL 105 104  CO2 20*  --   GLUCOSE 212* 213*  BUN 20 22*  CREATININE 1.23* 1.10*  CALCIUM 9.6  --    GFR: Estimated Creatinine Clearance: 51.5 mL/min (by C-G formula based on SCr of 1.1 mg/dL (H)). Liver Function Tests:  Recent Labs Lab 08/26/16 1937  AST 20  ALT 13*  ALKPHOS 78  BILITOT 0.3  PROT 6.9  ALBUMIN 3.5   No results for input(s): LIPASE, AMYLASE in the last 168 hours. No results for input(s): AMMONIA in the last 168 hours. Coagulation Profile:  Recent Labs Lab 08/26/16 1937  INR 1.01   Cardiac Enzymes: No results for input(s): CKTOTAL, CKMB, CKMBINDEX, TROPONINI in the last 168 hours. BNP (last 3 results) No results for input(s): PROBNP in the last 8760 hours. HbA1C: No results for input(s): HGBA1C in the last 72 hours. CBG: No results for input(s): GLUCAP in the last 168 hours. Lipid Profile: No results for input(s): CHOL, HDL, LDLCALC, TRIG, CHOLHDL, LDLDIRECT in the last 72 hours. Thyroid Function Tests: No results for input(s): TSH, T4TOTAL, FREET4, T3FREE, THYROIDAB in the last 72 hours. Anemia Panel: No results for input(s): VITAMINB12, FOLATE, FERRITIN, TIBC, IRON, RETICCTPCT in the last 72 hours. Sepsis Labs: Invalid input(s): PROCALCITONIN, LACTICIDVEN No results found for this or any previous visit (from the past 240 hour(s)).    Radiological Exams on Admission: Personally reviewed CT head report: Ct Head Code Stroke W/o Cm  Result Date: 08/26/2016 CLINICAL DATA:  Code stroke. Left-sided weakness, headache, and slurred speech. History of stroke. EXAM: CT HEAD WITHOUT CONTRAST TECHNIQUE: Contiguous axial images were obtained from the base of the skull through the vertex without intravenous contrast. COMPARISON:  Head CT and MRI 12/07/2015 FINDINGS: Brain: There is no evidence of acute cortical infarct, intracranial hemorrhage, mass, midline shift, or extra-axial fluid collection. Ventricles and sulci are within  normal limits for age. Vascular: No hyperdense vessel. Minimal carotid siphon calcification. Skull: No fracture or focal osseous lesion. Sinuses/Orbits: Partially visualized chronic right maxillary sinusitis with suspected fluid component as well. Clear mastoid air cells. Unremarkable orbits. Other: None. ASPECTS [facility] Stroke Program Early CT Score) - Ganglionic level infarction (caudate,  lentiform nuclei, internal capsule, insula, M1-M3 cortex): 7 - Supraganglionic infarction (M4-M6 cortex): 3 Total score (0-10 with 10 being normal): 10 IMPRESSION: 1. No evidence of acute intracranial abnormality. 2. ASPECTS is 10. These results were called by telephone at the time of interpretation on 08/26/2016 at 7:56 pm to Dr. Roseanne RenoStewart, who verbally acknowledged these results. Electronically Signed   By: Sebastian AcheAllen  Grady M.D.   On: 08/26/2016 19:58     EKG: Independently reviewed. Rate 71, left axis, QTc normal, no ST changes.  Echocardiogram Jan 2017: EF 60-65% Grade I diastolic dyfunction Severe LVH    Assessment/Plan 1. Acute Stroke vs TIA:  This is new.  MRI pending.  ABCD high risk.   -Admit to telemetry -Neuro checks, NIHSS per protocol -Continue daily aspirin 325 mg -Permissive hypertension for now -Lipids, hemoglobin A1c -Continue statin -Carotid doppler ordered -MRI/MRA brain/head ordered -Echocardiogram ordered -PT/OT/SLP consultation -Consult to Neurology, appreciate recommendations   2. Hypokalemia:  Mild. -Hold home K supp -IVF with K for now -Repeat BMP -Check mag  3. HTN:  -Permissive hypertension for now, hold spironolactone, lisinopril, HCTZ -Continue BB  4. NIDDM:  -Hold glipizide -SSI every 4 hours  5. Chronic pain:  -Restart Norco when able to take PO       DVT prophylaxis: Lovenox  Code Status: FULL  Family Communication: Daughter and son at bedside  Disposition Plan: Anticipate Stroke work up as above and consult to ancillary services.  Expect discharge  within 1-2 days, pending MRI results and PT evaluation tomorrow. Consults called: Neurology, Dr. Roseanne RenoStewart has seen patient. Admission status: Telemetry, OBS status for now, given Neuro suspicion for TIA  Core measures: -VTE prophylaxis ordered at time of admission -Aspirin ordered at admission -Atrial fibrillation: not present -tPA not given because of unclear history -Dysphagia screen ordered in ER -Lipids ordered -PT eval ordered -Non-smoker    Medical decision making: Patient seen at 10:10 PM on 08/26/2016.  The patient was discussed with Dr. Darrin LuisStenil. What exists of the patient's chart was reviewed in depth and [facility] were requested and summarized above.  Clinical condition: stable.       Alberteen SamChristopher P Danford [facility] Pager [phone]

## 2016-08-27 ENCOUNTER — Observation Stay (HOSPITAL_BASED_OUTPATIENT_CLINIC_OR_DEPARTMENT_OTHER): Payer: Medicare Other

## 2016-08-27 ENCOUNTER — Observation Stay (HOSPITAL_COMMUNITY): Payer: Medicare Other

## 2016-08-27 DIAGNOSIS — R0789 Other chest pain: Secondary | ICD-10-CM

## 2016-08-27 DIAGNOSIS — G459 Transient cerebral ischemic attack, unspecified: Secondary | ICD-10-CM | POA: Diagnosis not present

## 2016-08-27 DIAGNOSIS — I6300 Cerebral infarction due to thrombosis of unspecified precerebral artery: Secondary | ICD-10-CM | POA: Diagnosis not present

## 2016-08-27 DIAGNOSIS — R079 Chest pain, unspecified: Secondary | ICD-10-CM

## 2016-08-27 DIAGNOSIS — I10 Essential (primary) hypertension: Secondary | ICD-10-CM | POA: Diagnosis not present

## 2016-08-27 DIAGNOSIS — I639 Cerebral infarction, unspecified: Secondary | ICD-10-CM | POA: Diagnosis not present

## 2016-08-27 DIAGNOSIS — E119 Type 2 diabetes mellitus without complications: Secondary | ICD-10-CM | POA: Diagnosis not present

## 2016-08-27 DIAGNOSIS — J329 Chronic sinusitis, unspecified: Secondary | ICD-10-CM | POA: Diagnosis not present

## 2016-08-27 DIAGNOSIS — G43109 Migraine with aura, not intractable, without status migrainosus: Secondary | ICD-10-CM | POA: Diagnosis not present

## 2016-08-27 DIAGNOSIS — Z8673 Personal history of transient ischemic attack (TIA), and cerebral infarction without residual deficits: Secondary | ICD-10-CM

## 2016-08-27 LAB — GLUCOSE, CAPILLARY
GLUCOSE-CAPILLARY: 119 mg/dL — AB (ref 65–99)
GLUCOSE-CAPILLARY: 123 mg/dL — AB (ref 65–99)
GLUCOSE-CAPILLARY: 208 mg/dL — AB (ref 65–99)
GLUCOSE-CAPILLARY: 187 mg/dL — AB (ref 65–99)
Glucose-Capillary: 83 mg/dL (ref 65–99)
Glucose-Capillary: 120 mg/dL — ABNORMAL HIGH (ref 65–99)

## 2016-08-27 LAB — BASIC METABOLIC PANEL
Anion gap: 6 (ref 5–15)
BUN: 19 mg/dL (ref 6–20)
CO2: 25 mmol/L (ref 22–32)
Calcium: 9.1 mg/dL (ref 8.9–10.3)
Chloride: 109 mmol/L (ref 101–111)
Creatinine, Ser: 0.86 mg/dL (ref 0.44–1.00)
GFR calc Af Amer: >60 mL/min (ref >60)
GLUCOSE: 85 mg/dL (ref 65–99)
POTASSIUM: 3.5 mmol/L (ref 3.5–5.1)
Sodium: 140 mmol/L (ref 135–145)

## 2016-08-27 LAB — VAS US CAROTID
LCCADSYS: -62 cm/s
LCCAPDIAS: -22 cm/s
LEFT ECA DIAS: -9 cm/s
LEFT VERTEBRAL DIAS: 11 cm/s
LICADDIAS: -24 cm/s
Left CCA dist dias: -24 cm/s
Left CCA prox sys: -97 cm/s
Left ICA dist sys: -57 cm/s
Left ICA prox dias: -17 cm/s
Left ICA prox sys: -46 cm/s
RCCAPDIAS: 13 cm/s
RCCAPSYS: 62 cm/s
RIGHT ECA DIAS: -7 cm/s
RIGHT VERTEBRAL DIAS: 9 cm/s
Right cca dist sys: -41 cm/s

## 2016-08-27 LAB — LIPID PANEL
Cholesterol: 148 mg/dL (ref 0–200)
HDL: 38 mg/dL — AB (ref >40)
LDL CALC: 96 mg/dL (ref 0–99)
Total CHOL/HDL Ratio: 3.9 RATIO
Triglycerides: 72 mg/dL (ref <150)
VLDL: 14 mg/dL (ref 0–40)

## 2016-08-27 MED ORDER — HYDROCODONE-ACETAMINOPHEN 5-325 MG PO TABS: 1.0000 | ORAL_TABLET | Freq: Four times a day (QID) | ORAL | Status: DC | PRN

## 2016-08-27 MED ORDER — ATORVASTATIN CALCIUM 10 MG PO TABS
20.0000 mg | ORAL_TABLET | Freq: Every day | ORAL | Status: DC
  Administered 2016-08-27 – 2016-08-28 (×2): 20 mg via ORAL
  Filled 2016-08-27 (×2): qty 2

## 2016-08-27 NOTE — Progress Notes (Signed)
OT Cancellation Note  Patient Details Name: [name] [name] MRN: [id number] DOB: 1946/04/18   Cancelled Treatment:    Reason Eval/Treat Not Completed: Patient at procedure or test/ unavailable.  Gaye AlkenBailey A Cailin Gebel M.S., OTR/L Pager: [phone]  08/27/2016, 10:30 AM

## 2016-08-27 NOTE — Progress Notes (Signed)
PROGRESS NOTE [facility]   [name]   ZOX:[id number] DOB: 23-Aug-1946  DOA: 08/26/2016 PCP: Verlon AuBoyd, [name] [name], MD   Brief Narrative:  [name] BlinksVirginia L [name] is a 70 y.o. 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 Also c/o right-sided headache, and right side chest pain. CT of the head showed no acute intracranial abnormality. Neurology was consulted, deemed not a candidate for TPA. Patient placed on observation for workup of TIA/stroke.  Subjective:  Patient seen and examined at bedside with neurologist. No family member present, complaint at this time is chest pain to the right side and generalized weakness. MRI showed, remote right insular infarct but no cause seen for her speech chronic changes of atrophy and small vessel disease right maxillary paranasal sinuses disease.  Assessment & Plan:  1. Acute Stroke vs TIA:  MRI results reviewed, noted above - Neuro recommendations appreciated -Neuro checks, NIHSS per protocol -Continue daily aspirin 325 mg -Follow-up Lipids, hemoglobin A1c -Continue statin -Follow-up Carotid doppler  -Follow-up Echocardiogram  -PT/OT/SLP consultation appreciated  2. Hypokalemia:  K in the normal lower limit -Resume home K supp -Repeat BMP -Check mag  3. HTN:  -Medications on hold -Continue BB  4. NIDDM:  -Hold glipizide -SSI every 4 hours  5. Atypical chest pain - R side, TNI negative x 1, EKG with T wave inversion on the lateral leads. -Repeat EKG -Send TNI -Echo pending  DVT prophylaxis: Lovenox  Code Status: FULL  Family Communication: Daughter and son at bedside  Disposition Plan: Expect discharge within 1-2 days, pending PT evaluation tomorrow.   Consultants:   Neuro  Procedures:     Antimicrobials:      Objective: Vitals:   08/27/16 0630 08/27/16 1030 08/27/16 1230  08/27/16 1317  BP: 129/69 121/75 129/65 137/72  Pulse: (!) 52 (!) 58 (!) 56 (!) 52  Resp: 18   18  Temp: 98.2 F (36.8 C) 97.6 F (36.4 C)  98.5 F (36.9 C)  TempSrc: Oral Oral  Oral  SpO2: 96% 99% 100% 100%  Weight:      Height:       No intake or output data in the 24 hours ending 08/27/16 1657 Filed Weights   08/26/16 1957 08/27/16 0012  Weight: 85 kg (187 lb 6.3 oz) 87 kg (191 lb 12.8 oz)    Examination:  General exam: Appears calm and comfortable  Respiratory system: Clear to auscultation. No wheezes,crackle or rhonchi Cardiovascular system: S1 & S2 heard, RRR. No JVD, murmurs, rubs or gallops, No wall tenderness  Gastrointestinal system: Abdomen is nondistended, soft and nontender. No organomegaly or masses felt. Central nervous system: Alert and oriented. Exam inconsistent, good grasp, Muscle tone flaccid , LE Leg drift, CN intact Extremities: No pedal edema. Symmetric,  Skin: No rashes, lesions or ulcers Psychiatry: Judgement and insight appear normal. Mood & affect flat.    Data Reviewed: I have personally reviewed following labs and imaging studies  CBC:  Recent Labs Lab 08/26/16 1937 08/26/16 1948  WBC 7.3  --   NEUTROABS 3.0  --   HGB 13.3 13.9  HCT 40.5 41.0  MCV 92.7  --   PLT 234  --    Basic Metabolic Panel:  Recent Labs Lab 08/26/16 1937 08/26/16 1948 08/27/16 0340  NA 137 140 140  K 3.3* 3.3* 3.5  CL 105 104 109  CO2 20*  --  25  GLUCOSE 212* 213* 85  BUN 20 22* 19  CREATININE 1.23* 1.10* 0.86  CALCIUM 9.6  --  9.1   GFR: Estimated Creatinine Clearance: 66.7 mL/min (by C-G formula based on SCr of 0.86 mg/dL). Liver Function Tests:  Recent Labs Lab 08/26/16 1937  AST 20  ALT 13*  ALKPHOS 78  BILITOT 0.3  PROT 6.9  ALBUMIN 3.5   No results for input(s): LIPASE, AMYLASE in the last 168 hours. No results for input(s): AMMONIA in the last 168 hours. Coagulation Profile:  Recent Labs Lab 08/26/16 1937  INR 1.01   Cardiac  Enzymes: No results for input(s): CKTOTAL, CKMB, CKMBINDEX, TROPONINI in the last 168 hours. BNP (last 3 results) No results for input(s): PROBNP in the last 8760 hours. HbA1C: No results for input(s): HGBA1C in the last 72 hours. CBG:  Recent Labs Lab 08/26/16 2343 08/27/16 0416 08/27/16 0711 08/27/16 1129  GLUCAP 146* 83 119* 123*   Lipid Profile:  Recent Labs  08/27/16 0340  CHOL 148  HDL 38*  LDLCALC 96  TRIG 72  CHOLHDL 3.9   Thyroid Function Tests: No results for input(s): TSH, T4TOTAL, FREET4, T3FREE, THYROIDAB in the last 72 hours. Anemia Panel: No results for input(s): VITAMINB12, FOLATE, FERRITIN, TIBC, IRON, RETICCTPCT in the last 72 hours. Sepsis Labs: No results for input(s): PROCALCITON, LATICACIDVEN in the last 168 hours.  No results found for this or any previous visit (from the past 240 hour(s)).    Radiology Studies: Mr Angiogram Head Wo Contrast  Result Date: 08/27/2016 CLINICAL DATA:  New onset of slurred speech. History of previous stroke with LEFT-sided weakness. EXAM: MRI HEAD WITHOUT CONTRAST MRA HEAD WITHOUT CONTRAST TECHNIQUE: Multiplanar, multiecho pulse sequences of the brain and surrounding structures were obtained without intravenous contrast. Angiographic images of the head were obtained using MRA technique without contrast. COMPARISON:  MRI of 12/07/2015. FINDINGS: MRI HEAD FINDINGS Brain: No evidence for acute infarction, hemorrhage, mass lesion, hydrocephalus, or extra-axial fluid. Mild atrophy. T2 and FLAIR hyperintensities throughout the white matter representing small vessel disease. There is a remote area of cerebral infarction with gliosis and encephalomalacia affecting the RIGHT insula. Vascular: Flow voids are maintained throughout the carotid, basilar, and vertebral arteries. There are no areas of chronic hemorrhage. Skull and upper cervical spine: Unremarkable visualized calvarium, skullbase, and cervical vertebrae. Pituitary, pineal,  cerebellar tonsils unremarkable. No upper cervical More lesions. Sinuses/Orbits: No orbital findings of significance. Extensive chronic paranasal sinus disease of the RIGHT maxillary sinus. Slight expansion, minimal residual aeration, secondary chronic osteitis on CT. Other: None. MRA HEAD FINDINGS Dolichoectatic but widely patent internal carotid arteries. Basilar artery also widely patent with both vertebrals contributing, LEFT slightly larger. No proximal stenosis or irregularity of the middle cerebral artery M1 segments. Normal MCA bifurcation on the RIGHT and LEFT. Hypoplastic RIGHT A1 anterior cerebral artery. Azygos LEFT A1 ACA supplies both distal anterior cerebral arteries. No proximal PCA stenosis of significance. No cerebellar branch occlusion. No saccular aneurysm. IMPRESSION: Chronic changes of atrophy and small vessel disease. No acute intracranial abnormality. Remote RIGHT insular infarct, but no cause seen for slurred speech. Unremarkable MRA of the intracranial circulation. Significant RIGHT maxillary paranasal sinus disease. ENT consultation may be warranted. Electronically Signed   By: Elsie Stain M.D.   On: 08/27/2016 11:22   Mr Brain Wo Contrast  Result Date: 08/27/2016 CLINICAL DATA:  New onset of slurred speech. History of previous stroke with LEFT-sided weakness. EXAM: MRI HEAD WITHOUT CONTRAST MRA HEAD WITHOUT CONTRAST  TECHNIQUE: Multiplanar, multiecho pulse sequences of the brain and surrounding structures were obtained without intravenous contrast. Angiographic images of the head were obtained using MRA technique without contrast. COMPARISON:  MRI of 12/07/2015. FINDINGS: MRI HEAD FINDINGS Brain: No evidence for acute infarction, hemorrhage, mass lesion, hydrocephalus, or extra-axial fluid. Mild atrophy. T2 and FLAIR hyperintensities throughout the white matter representing small vessel disease. There is a remote area of cerebral infarction with gliosis and encephalomalacia affecting  the RIGHT insula. Vascular: Flow voids are maintained throughout the carotid, basilar, and vertebral arteries. There are no areas of chronic hemorrhage. Skull and upper cervical spine: Unremarkable visualized calvarium, skullbase, and cervical vertebrae. Pituitary, pineal, cerebellar tonsils unremarkable. No upper cervical [place] lesions. Sinuses/Orbits: No orbital findings of significance. Extensive chronic paranasal sinus disease of the RIGHT maxillary sinus. Slight expansion, minimal residual aeration, secondary chronic osteitis on CT. Other: None. MRA HEAD FINDINGS Dolichoectatic but widely patent internal carotid arteries. Basilar artery also widely patent with both vertebrals contributing, LEFT slightly larger. No proximal stenosis or irregularity of the middle cerebral artery M1 segments. Normal MCA bifurcation on the RIGHT and LEFT. Hypoplastic RIGHT A1 anterior cerebral artery. Azygos LEFT A1 ACA supplies both distal anterior cerebral arteries. No proximal PCA stenosis of significance. No cerebellar branch occlusion. No saccular aneurysm. IMPRESSION: Chronic changes of atrophy and small vessel disease. No acute intracranial abnormality. Remote RIGHT insular infarct, but no cause seen for slurred speech. Unremarkable MRA of the intracranial circulation. Significant RIGHT maxillary paranasal sinus disease. ENT consultation may be warranted. Electronically Signed   By: Elsie Stain M.D.   On: 08/27/2016 11:22   Ct Head Code Stroke W/o Cm  Result Date: 08/26/2016 CLINICAL DATA:  Code stroke. Left-sided weakness, headache, and slurred speech. History of stroke. EXAM: CT HEAD WITHOUT CONTRAST TECHNIQUE: Contiguous axial images were obtained from the base of the skull through the vertex without intravenous contrast. COMPARISON:  Head CT and MRI 12/07/2015 FINDINGS: Brain: There is no evidence of acute cortical infarct, intracranial hemorrhage, mass, midline shift, or extra-axial fluid collection. Ventricles and  sulci are within normal limits for age. Vascular: No hyperdense vessel. Minimal carotid siphon calcification. Skull: No fracture or focal osseous lesion. Sinuses/Orbits: Partially visualized chronic right maxillary sinusitis with suspected fluid component as well. Clear mastoid air cells. Unremarkable orbits. Other: None. ASPECTS [facility] Stroke Program Early CT Score) - Ganglionic level infarction (caudate, lentiform nuclei, internal capsule, insula, M1-M3 cortex): 7 - Supraganglionic infarction (M4-M6 cortex): 3 Total score (0-10 with 10 being normal): 10 IMPRESSION: 1. No evidence of acute intracranial abnormality. 2. ASPECTS is 10. These results were called by telephone at the time of interpretation on 08/26/2016 at 7:56 pm to Dr. Roseanne Reno, who verbally acknowledged these results. Electronically Signed   By: Sebastian Ache M.D.   On: 08/26/2016 19:58      Scheduled Meds: .  stroke: mapping our early stages of recovery book   Does not apply Once  . aspirin  300 mg Rectal Daily   Or  . aspirin  325 mg Oral Daily  . atorvastatin  20 mg Oral q1800  . enoxaparin (LOVENOX) injection  40 mg Subcutaneous Q24H  . insulin aspart  0-15 Units Subcutaneous Q4H  . metoprolol succinate  50 mg Oral Daily  . pantoprazole  40 mg Oral Daily   Continuous Infusions: . 0.9 % NaCl with KCl 20 mEq / L 125 mL/hr at 08/27/16 0821     LOS: 0 days    Latrelle Dodrill, MD [facility] Pager [phone]  If 7PM-7AM, please contact night-coverage [email or web address] Password [email or web address] 08/27/2016, 4:57 PM

## 2016-08-27 NOTE — Evaluation (Signed)
Clinical/Bedside Swallow Evaluation Patient Details  Name: [name] [name] MRN: [id number] Date of Birth: Apr 07, 1946  Today's Date: 08/27/2016 Time: SLP Start Time (ACUTE ONLY): 1206 SLP Stop Time (ACUTE ONLY): 1217 SLP Time Calculation (min) (ACUTE ONLY): 11 min  Past Medical History:  Past Medical History:  Diagnosis Date  . Chest pain 2009   MV with no scar or ischemia, EF 65%  . Diabetes mellitus   . GERD (gastroesophageal reflux disease)   . History of cardiac catheterization    a. Myoview 2/15 with anteroapical ischemia, EF 64% (intermediate risk) >> LHC - normal cos, EF 55-60%  . Hypertension   . Neuromuscular disorder (HCC)    arotic Aneurysm  . Obesity (BMI 35.0-39.9 without comorbidity)   . Pneumonia   . Stroke [facility](HCC)    with some resid L sided weakness  . Thoracic ascending aortic aneurysm (HCC)    a. CT 1/17: ascending aorta 4.2 x 4.0 cm   Past Surgical History:  Past Surgical History:  Procedure Laterality Date  . ABDOMINAL HYSTERECTOMY  2006   Partial, one ovary left   . [place]  . CHOLECYSTECTOMY    . LEFT HEART CATHETERIZATION WITH CORONARY ANGIOGRAM N/A 01/09/2014   Procedure: LEFT HEART CATHETERIZATION WITH CORONARY ANGIOGRAM;  Surgeon: Marykay Lexavid W Harding, MD;  [facility];  Service: Cardiovascular;  Laterality: N/A;  . TEMPOROMANDIBULAR JOINT SURGERY    . WISDOM TOOTH EXTRACTION     HPI:  [name] [name] a 70 y.o.femalewith a past medical history significant for NIDDM, HTN, pna, GERD and previous stroke with mild residual L sided weaknesswho presents with slurred speech. MRI no acute intracranial abnormality. Remote RIGHT insular infarct, but no cause seen for slurred speech. BSE 12/08/15 intact swallow function, reg/thin recommended.   Assessment / Plan / Recommendation Clinical Impression  Pt demonstrated intact oropharyngeal swallow function. No indications of dysfunction and low  aspiration risk. Recommend regular texture, thin liquids and no follow up necessary.     Aspiration Risk  Mild aspiration risk    Diet Recommendation Regular;Thin liquid   Liquid Administration via: Cup;Straw Medication Administration: Whole meds with liquid Supervision: Patient able to self feed Compensations: Slow rate;Small sips/bites Postural Changes: Seated upright at 90 degrees    Other  Recommendations Oral Care Recommendations: Oral care BID   Follow up Recommendations None      Frequency and Duration            Prognosis        Swallow Study   General HPI: [name] [name] a 70 y.o.femalewith a past medical history significant for NIDDM, HTN, pna, GERD and previous stroke with mild residual L sided weaknesswho presents with slurred speech. MRI no acute intracranial abnormality. Remote RIGHT insular infarct, but no cause seen for slurred speech. BSE 12/08/15 intact swallow function, reg/thin recommended. Type of Study: Bedside Swallow Evaluation Previous Swallow Assessment:  (see HPI) Diet Prior to this Study: NPO Temperature Spikes Noted: No Respiratory Status: Room air History of Recent Intubation: No Behavior/Cognition: Alert;Cooperative;Pleasant mood Oral Cavity Assessment: Within Functional Limits Oral Care Completed by SLP: No Oral Cavity - Dentition: Adequate natural dentition Vision: Functional for self-feeding Self-Feeding Abilities: Able to feed self Patient Positioning: Upright in bed Baseline Vocal Quality: Normal Volitional Cough: Strong Volitional Swallow: Able to elicit    Oral/Motor/Sensory Function Overall Oral Motor/Sensory Function: Within functional limits   Ice Chips Ice chips: Not tested   Thin  Liquid Thin Liquid: Within functional limits Presentation: Cup;Straw    Nectar Thick Nectar Thick Liquid: Not tested   Honey Thick Honey Thick Liquid: Not tested   Puree Puree: Within functional limits   Solid   GO   Solid: Within  functional limits    Functional Assessment Tool Used: skilled clinical judgement Functional Limitations: Swallowing Swallow Current Status (Z6109): 0 percent impaired, limited or restricted Swallow Goal Status (U0454): 0 percent impaired, limited or restricted Swallow Discharge Status [phone]): 0 percent impaired, limited or restricted   Royce Macadamia 08/27/2016,1:28 PM   Breck Coons Lonell Face.Ed [employer] [phone]

## 2016-08-27 NOTE — Evaluation (Addendum)
Physical Therapy Evaluation Patient Details Name: [name] [name] MRN: [id number] DOB: 18-Mar-1946 Today's Date: 08/27/2016   History of Present Illness  70 y.o. female with a past medical history significant for NIDDM, HTN, and previous stroke with mild residual L sided weakness who presents with slurred speech. MRI on 10/8 neg for acute intracranial abnormality, did show remote RIGHT insular infarct.    Clinical Impression  Patient presents with problems listed below.  Will benefit from acute PT to maximize functional independence prior to return home with family assist.  Patient with decreased strength and balance impacting mobility, gait, and safety.  Instructed patient to use RW at all times when up.  Per OT note, family can provide 24/7 assist.  Recommend HHPT at d/c for continued therapy for mobility, gait, and balance training.    Follow Up Recommendations Home health PT;Supervision/Assistance - 24 hour    Equipment Recommendations  Rolling walker with 5" wheels    Recommendations for Other Services       Precautions / Restrictions Precautions Precautions: Fall Restrictions Weight Bearing Restrictions: No      Mobility  Bed Mobility               General bed mobility comments: Patient in chair  Transfers Overall transfer level: Needs assistance Equipment used: Rolling walker (2 wheeled) Transfers: Sit to/from Stand Sit to Stand: Min assist         General transfer comment: Verbal cues for hand placement.  Assist to rise to standing and for balance during transition.  Ambulation/Gait Ambulation/Gait assistance: Min assist Ambulation Distance (Feet): 40 Feet Assistive device: Rolling walker (2 wheeled) Gait Pattern/deviations: Step-through pattern;Decreased stance time - left;Decreased step length - right;Decreased step length - left;Decreased stride length;Decreased weight shift to left;Trunk flexed Gait velocity: decreased Gait velocity interpretation:  Below normal speed for age/gender General Gait Details: Verbal cues for safe use of RW.  Patient with slow, unsteady gait.  Note Lt knee buckling during stance phase of gait.  Assist for safety and balance.  Stairs            Wheelchair Mobility    Modified Rankin (Stroke Patients Only) Modified Rankin (Stroke Patients Only) Pre-Morbid Rankin Score: No significant disability Modified Rankin: Moderately severe disability     Balance Overall balance assessment: Needs assistance         Standing balance support: Single extremity supported Standing balance-Leahy Scale: Poor                               Pertinent Vitals/Pain Pain Assessment: No/denies pain    Home Living Family/patient expects to be discharged to:: Private residence Living Arrangements: Alone Available Help at Discharge: Family;Available 24 hours/day Type of Home: House Home Access: Level entry     Home Layout: One level Home Equipment: Cane - single point;Hand held shower head;Grab bars - tub/shower;Bedside commode Additional Comments: Son and daughter both live within walking distance to pts house.    Prior Function Level of Independence: Independent         Comments: Pt reports she has a cane at home but does not use it. Reports he family has been wanting her to use it more than she does.     Hand Dominance   Dominant Hand: Right    Extremity/Trunk Assessment   Upper Extremity Assessment: Defer to OT evaluation           Lower Extremity Assessment: RLE  deficits/detail;LLE deficits/detail RLE Deficits / Details: Strength grossly 4/5 LLE Deficits / Details: Strength grossly 3+/5; decreased light touch.  Patient reports weakness is worse than baseline following prior CVA.  Cervical / Trunk Assessment: Kyphotic  Communication   Communication: No difficulties  Cognition Arousal/Alertness: Awake/alert Behavior During Therapy: WFL for tasks assessed/performed Overall  Cognitive Status: Within Functional Limits for tasks assessed                      General Comments      Exercises General Exercises - Lower Extremity Long Arc Quad: AROM;Both;5 reps;Seated Hip Flexion/Marching: AROM;Both;5 reps;Seated Toe Raises: AROM;Both;10 reps;Seated Heel Raises: AROM;Both;10 reps;Seated   Assessment/Plan    PT Assessment Patient needs continued PT services  PT Problem List Decreased strength;Decreased activity tolerance;Decreased balance;Decreased mobility;Decreased coordination;Decreased knowledge of use of DME;Impaired sensation          PT Treatment Interventions DME instruction;Gait training;Functional mobility training;Therapeutic activities;Therapeutic exercise;Balance training;Neuromuscular re-education;Patient/family education    PT Goals (Current goals can be found in the Care Plan section)  Acute Rehab PT Goals Patient Stated Goal: return home PT Goal Formulation: With patient/family Time For Goal Achievement: 09/03/16 Potential to Achieve Goals: Good    Frequency Min 4X/week   Barriers to discharge Decreased caregiver support Patient lives alone    Co-evaluation               End of Session Equipment Utilized During Treatment: Gait belt Activity Tolerance: Patient tolerated treatment well Patient left: in chair;with call bell/phone within reach;with chair alarm set;with family/visitor present Nurse Communication: Mobility status    Functional Assessment Tool Used: Clinical judgement Functional Limitation: Mobility: Walking and moving around Mobility: Walking and Moving Around Current Status [phone](G8978): At least 20 percent but less than 40 percent impaired, limited or restricted Mobility: Walking and Moving Around Goal Status [phone](G8979): At least 1 percent but less than 20 percent impaired, limited or restricted    Time: [id number] PT Time Calculation (min) (ACUTE ONLY): 18 min   Charges:   PT Evaluation $PT Eval Moderate  Complexity: 1 Procedure     PT G Codes:   PT G-Codes **NOT FOR INPATIENT CLASS** Functional Assessment Tool Used: Clinical judgement Functional Limitation: Mobility: Walking and moving around Mobility: Walking and Moving Around Current Status (N8295(G8978): At least 20 percent but less than 40 percent impaired, limited or restricted Mobility: Walking and Moving Around Goal Status [phone](G8979): At least 1 percent but less than 20 percent impaired, limited or restricted    Vena AustriaDavis, Yerick Eggebrecht H 08/27/2016, 7:22 PM Durenda HurtSusan H. Renaldo Fiddleravis, PT, [facility] Acute Rehab Services Pager [phone]

## 2016-08-27 NOTE — Progress Notes (Signed)
Speech Language Pathology  Patient Details Name: [name] [name] MRN: [id number] DOB: 1946-01-30 Today's Date: 08/27/2016 Time: 1206-1217 SLP Time Calculation (min) (ACUTE ONLY): 11 min       Sceen for speech-language-cognition did not reveal need for formal assessment in these areas.    Breck CoonsLisa Willis SpringLitaker M.Ed [employer] Pager [phone]

## 2016-08-27 NOTE — Progress Notes (Signed)
PT Cancellation Note  Patient Details Name: [name] [name] MRN: [id number] DOB: 09-11-1946   Cancelled Treatment:    Reason Eval/Treat Not Completed: Patient at procedure or test/unavailable.  Will return later today for PT evaluation.   Vena AustriaDavis, Hiroyuki Ozanich H 08/27/2016, 9:37 AM Durenda HurtSusan H. Renaldo Fiddleravis, PT, [facility] Acute Rehab Services Pager [phone]

## 2016-08-27 NOTE — Evaluation (Signed)
Occupational Therapy Evaluation Patient Details Name: [name] [name] MRN: [id number] DOB: Aug 09, 1946 Today's Date: 08/27/2016    History of Present Illness 70 y.o. female with a past medical history significant for NIDDM, HTN, and previous stroke with mild residual L sided weakness who presents with slurred speech. MRI on 10/8 neg for acute intracranial abnormality, did show remote RIGHT insular infarct.   Clinical Impression   Pt reports she was independent with ADL PTA. Currently pt requires mod assist for functional mobility with hand held assist and min assist for ADL. Pt presenting with visual deficits, decreased strength/coordination/sensation in LUE, poor standing balance impacting her independence and safety with ADL and functional mobility. Pt lives alone but daughter reports family can provide 24/7 supervision if needed. Recommending HHOT for follow up to maximize independence and safety with ADL and functional mobility upon return home. Pt would benefit from continued skilled OT to address established goals.    Follow Up Recommendations  Home health OT;Supervision/Assistance - 24 hour    Equipment Recommendations  Tub/shower seat    Recommendations for Other Services PT consult     Precautions / Restrictions Precautions Precautions: Fall Restrictions Weight Bearing Restrictions: No      Mobility Bed Mobility Overal bed mobility: Needs Assistance Bed Mobility: Supine to Sit     Supine to sit: Supervision;HOB elevated     General bed mobility comments: Supervision for safety. HOB elevated and use of bed rail. Increased time to perform and cues to scoot hips out to EOB.  Transfers Overall transfer level: Needs assistance Equipment used: 1 person hand held assist Transfers: Sit to/from Stand Sit to Stand: Min assist         General transfer comment: Min hand held assist for balance with sit to stand from EOB x1, BSC x1.    Balance Overall balance  assessment: Needs assistance Sitting-balance support: Feet supported;No upper extremity supported Sitting balance-Leahy Scale: Good     Standing balance support: Single extremity supported Standing balance-Leahy Scale: Poor                              ADL Overall ADL's : Needs assistance/impaired Eating/Feeding: Set up;Sitting   Grooming: Minimal assistance;Standing Grooming Details (indicate cue type and reason): for balance in standing Upper Body Bathing: Set up;Supervision/ safety;Sitting   Lower Body Bathing: Minimal assistance;Sit to/from stand   Upper Body Dressing : Set up;Supervision/safety;Sitting   Lower Body Dressing: Minimal assistance;Sit to/from stand Lower Body Dressing Details (indicate cue type and reason): Able to don socks sitting EOB; min assist for sit to stand Toilet Transfer: Moderate assistance;Ambulation;Regular Toilet (hand held assist and holding IV pole)   Toileting- Clothing Manipulation and Hygiene: Minimal assistance;Sit to/from stand       Functional mobility during ADLs: Moderate assistance (hand held assist and holding IV pole) General ADL Comments: Encouraged functional use of LUE and discussed potential need for 24/7 supervision. Daughter reports they can arrange someone to be home with pt at all times.     Vision Vision Assessment?: Yes Eye Alignment: Within Functional Limits Ocular Range of Motion: Within Functional Limits Alignment/Gaze Preference: Within Defined Limits Tracking/Visual Pursuits: Able to track stimulus in all quads without difficulty Visual Fields: Left visual field deficit (blurred on L visual field) Additional Comments: Pt reports blurred vision in L visual field; dissappears with L eye occluded.   Perception     Praxis      Pertinent Vitals/Pain  Pain Assessment: No/denies pain     Hand Dominance Right   Extremity/Trunk Assessment Upper Extremity Assessment Upper Extremity Assessment: LUE  deficits/detail LUE Deficits / Details: Grossly 4/5 strength. Slight decrease in shoulder AROM. Decreased grip strength. Pt reports sensation changes throughout LUE. LUE Sensation: decreased light touch   Lower Extremity Assessment Lower Extremity Assessment: Defer to PT evaluation   Cervical / Trunk Assessment Cervical / Trunk Assessment: Kyphotic   Communication Communication Communication: No difficulties   Cognition Arousal/Alertness: Awake/alert Behavior During Therapy: WFL for tasks assessed/performed Overall Cognitive Status: Within Functional Limits for tasks assessed                     General Comments       Exercises       Shoulder Instructions      Home Living Family/patient expects to be discharged to:: Private residence Living Arrangements: Alone Available Help at Discharge: Family;Available 24 hours/day Type of Home: House Home Access: Level entry     Home Layout: One level     Bathroom Shower/Tub: Tub/shower unit Shower/tub characteristics: Curtain FirefighterBathroom Toilet: Standard     Home Equipment: Cane - single point;Hand held shower head;Grab bars - tub/shower;Bedside commode   Additional Comments: Son and daughter both live within walking distance to pts house.      Prior Functioning/Environment Level of Independence: Independent        Comments: Pt reports she has a cane at home but does not use it. Reports he family has been wanting her to use it more than she does.        OT Problem List: Decreased strength;Decreased range of motion;Impaired balance (sitting and/or standing);Impaired vision/perception;Decreased coordination;Decreased knowledge of use of DME or AE;Decreased knowledge of precautions;Impaired sensation;Impaired UE functional use   OT Treatment/Interventions: Self-care/ADL training;Therapeutic exercise;Neuromuscular education;Energy conservation;DME and/or AE instruction;Therapeutic activities;Visual/perceptual  remediation/compensation;Patient/family education;Balance training    OT Goals(Current goals can be found in the care plan section) Acute Rehab OT Goals Patient Stated Goal: return home OT Goal Formulation: With patient/family Time For Goal Achievement: 09/10/16 Potential to Achieve Goals: Good ADL Goals Pt Will Perform Grooming: with supervision;standing Pt Will Transfer to Toilet: with supervision;ambulating;bedside commode Pt Will Perform Toileting - Clothing Manipulation and hygiene: with supervision;sit to/from stand Pt Will Perform Tub/Shower Transfer: Tub transfer;with supervision;ambulating;shower seat;rolling walker Pt/caregiver will Perform Home Exercise Program: Increased strength;Left upper extremity;Independently;With written HEP provided Additional ADL Goal #1: Pt will correctly identify 5/5 objects in L visual field without verbal cues.  OT Frequency: Min 2X/week   Barriers to D/C:            Co-evaluation              End of Session Equipment Utilized During Treatment: Gait belt Nurse Communication: Mobility status  Activity Tolerance: Patient tolerated treatment well Patient left: in chair;with call bell/phone within reach;with chair alarm set;with family/visitor present   Time: 1341-1401 OT Time Calculation (min): 20 min Charges:  OT General Charges $OT Visit: 1 Procedure OT Evaluation $OT Eval Moderate Complexity: 1 Procedure G-Codes: OT G-codes **NOT FOR INPATIENT CLASS** Functional Assessment Tool Used: Clinical judgement Functional Limitation: Self care Self Care Current Status (Z6109(G8987): At least 20 percent but less than 40 percent impaired, limited or restricted Self Care Goal Status (U0454(G8988): At least 1 percent but less than 20 percent impaired, limited or restricted   Gaye AlkenBailey A Blaise Palladino M.S., OTR/L Pager: [phone]  08/27/2016, 2:21 PM

## 2016-08-27 NOTE — Progress Notes (Signed)
VASCULAR LAB PRELIMINARY  PRELIMINARY  PRELIMINARY  PRELIMINARY  Carotid duplex completed.    Preliminary report:  1-39% ICA plaquing. Vertebral artery flow is antegrade.   [name] [name], RVT 08/27/2016, 10:54 AM

## 2016-08-27 NOTE — Progress Notes (Signed)
STROKE TEAM PROGRESS NOTE   HISTORY OF PRESENT ILLNESS (per record) [place] is an 70 y.o. female with a history of diabetes mellitus, hypertension and hyperlipidemia as well as TIA in January 2017, presenting with recurrent acute left-sided weakness. Onset was at 1:00 PM today. She's also complaining of right-sided headache. She's noted changes in her speech as well. CT scan of the head showed no acute intracranial abnormality. Workup in January, including MRI of the brain did not show acute stroke. She's been taking aspirin daily. NIH stroke score was 12.  LSN: 1:00 PM on 08/26/2016 tPA Given: No: Beyond time under for treatment consideration mRankin:    SUBJECTIVE (INTERVAL HISTORY) No family members present. Inconsistencies noted in the patient's neurologic exam.Review of chart show multiple prior admissions for TIAs with worsening left hemiparesis but negative brain MRIs   OBJECTIVE Temp:  [97.6 F (36.4 C)-98.5 F (36.9 C)] 98.5 F (36.9 C) (10/08 1317) Pulse Rate:  [52-72] 52 (10/08 1317) Cardiac Rhythm: Sinus bradycardia (10/08 0700) Resp:  [15-22] 18 (10/08 1317) BP: (121-176)/(65-91) 137/72 (10/08 1317) SpO2:  [93 %-100 %] 100 % (10/08 1317) FiO2 (%):  [0 %] 0 % (10/07 2327) Weight:  [85 kg (187 lb 6.3 oz)-87 kg (191 lb 12.8 oz)] 87 kg (191 lb 12.8 oz) (10/08 0012)  CBC:   Recent Labs Lab 08/26/16 1937 08/26/16 1948  WBC 7.3  --   NEUTROABS 3.0  --   HGB 13.3 13.9  HCT 40.5 41.0  MCV 92.7  --   PLT 234  --     Basic Metabolic Panel:   Recent Labs Lab 08/26/16 1937 08/26/16 1948 08/27/16 0340  NA 137 140 140  K 3.3* 3.3* 3.5  CL 105 104 109  CO2 20*  --  25  GLUCOSE 212* 213* 85  BUN 20 22* 19  CREATININE 1.23* 1.10* 0.86  CALCIUM 9.6  --  9.1    Lipid Panel:     Component Value Date/Time   CHOL 148 08/27/2016 0340   TRIG 72 08/27/2016 0340   HDL 38 (L) 08/27/2016 0340   CHOLHDL 3.9 08/27/2016 0340   VLDL 14 08/27/2016 0340   LDLCALC  96 08/27/2016 0340   HgbA1c:  Lab Results  Component Value Date   HGBA1C 8.0 (H) 12/08/2015   Urine Drug Screen:     Component Value Date/Time   LABOPIA NONE DETECTED 08/26/2016 2015   COCAINSCRNUR NONE DETECTED 08/26/2016 2015   LABBENZ NONE DETECTED 08/26/2016 2015   AMPHETMU NONE DETECTED 08/26/2016 2015   THCU NONE DETECTED 08/26/2016 2015   LABBARB NONE DETECTED 08/26/2016 2015      IMAGING  Ct Head Code Stroke W/o Cm 08/26/2016 1. No evidence of acute intracranial abnormality.  2. ASPECTS is 10.      MR Brain Wo Contrast 08/27/2016 Chronic changes of atrophy and small vessel disease. No acute intracranial abnormality. Remote RIGHT insular infarct, but no cause seen for slurred speech. Unremarkable MRA of the intracranial circulation. Significant RIGHT maxillary paranasal sinus disease.  ENT consultation may be warranted.    PHYSICAL EXAM HEENT-  Normocephalic, no lesions, without obvious abnormality.  Normal external eye and conjunctiva.  Normal TM's bilaterally.  Normal auditory canals and external ears. Normal external nose, mucus membranes and septum.  Normal pharynx. Neck supple with no masses, nodes, nodules or enlargement. Cardiovascular - regular rate and rhythm, S1, S2 normal, no murmur, click, rub or gallop Lungs - chest clear, no wheezing, rales, normal symmetric air  entry Abdomen - soft, non-tender; bowel sounds normal; no masses,  no organomegaly Extremities - no joint deformities, effusion, or inflammation  Neurologic Examination: Mental Status: Alert, oriented, anxious and tearful.  Speech slightly slurred with stuttering quality, and inconsistent. No evidence of aphasia. Able to follow commands without difficulty. Cranial Nerves: II-Visual fields were normal. III/IV/VI-Pupils were equal and reacted normally to light. Extraocular movements were full and conjugate.    V/VII-reduced perception tactile sensation of left-sided face compared to the  right; equivocal left lower facial weakness, which was inconsistent. VIII-normal. X-slightly dysarthric speech, although suspicious for emellishment. XI: trapezius strength/neck flexion strength normal bilaterally XII-midline tongue extension with normal strength. Motor: Poor effort with testing of left extremities; with variable effort and easly distractiblecannot rule out mild to moderate weakness; muscle tone was flaccid throughout; strength of right extremities was normal. Sensory: Reduced perception of tactile sensation over left extremities compared to right extremities. Deep Tendon Reflexes: 1+ and symmetric. Plantars: Mute bilaterally Cerebellar: Normal finger-to-nose testing with use of right upper extremity.      ASSESSMENT/PLAN [name] is a 70 y.o. female with history of a previous stroke, thoracic ascending aortic aneurysm, obesity, hypertension, diabetes mellitus, history of chest pain with previous normal cardiac catheterization in 2015 presenting with left hemiparesis and speech changes.  She did not receive IV t-PA due to late presentation.  No acute infarct:  Inconsistencies noted in neurologic exam.  Resultant  Mild left hemiplegia with nonorganic features on exam  MRI - no acute infarct. Remote RIGHT insular infarct,  MRA - unremarkable MRA of the intracranial circulation.  Carotid Doppler - 1-39% ICA plaquing. Vertebral artery flow is antegrade.   2D Echo - pending  LDL - 96  HgbA1c - pending  VTE prophylaxis - Lovenox Diet regular Room service appropriate? Yes; Fluid consistency: Thin  aspirin 325 mg daily prior to admission, now on aspirin 325 mg daily  Patient counseled to be compliant with her antithrombotic medications  Ongoing aggressive stroke risk factor management  Therapy recommendations: Home health OT recommended. PT evaluation pending.  Disposition: Pending  Hypertension  Stable  Permissive hypertension (OK if < 220/120)  but gradually normalize in 5-7 days  Long-term BP goal normotensive  Hyperlipidemia  Home meds:  Lipitor 10 mg daily resumed in hospital  LDL 96, goal < 70  Increase Lipitor to 20 mg daily  Continue statin at discharge  Diabetes   HgbA1c pending, goal < 7.0  Uncontrolled  Other Stroke Risk Factors  Advanced age  Former smoker - quit 37 years ago.  Obesity, Body mass index is 30.96 kg/m., recommend weight loss, diet and exercise as appropriate   Hx stroke/TIA   Other Active Problems  Inconsistencies in neurologic exam per Dr. Pearlean Brownie.  Significant RIGHT maxillary paranasal sinus disease. ENT consultation may be warranted. (see above)    Hospital day # 0  Delton See PA-C [facility] Pager [phone] 08/27/2016, 4:08 PM I have personally examined this patient, reviewed notes, independently viewed imaging studies, participated in medical decision making and plan of care.ROS completed by me personally and pertinent positives fully documented  I have made any additions or clarifications directly to the above note. Agree with note above. She presented withsubjective left hemiparesis but exam is illconsistent with nonorganic features and given prior similar episodes and negative imaging doubt new stroke/Continue ongoing evaluation.Greater than 50% time during this 25 minute visit was spent on counselling and coordination of care about stroke and hemiparesis Pramod  Pearlean Brownie, MD Medical Director [facility] Pager: [phone] 08/27/2016 7:15 PM   To contact Stroke Continuity provider, please refer to [email or web address]. After hours, contact General Neurology

## 2016-08-28 ENCOUNTER — Ambulatory Visit (HOSPITAL_BASED_OUTPATIENT_CLINIC_OR_DEPARTMENT_OTHER): Payer: Medicare Other

## 2016-08-28 DIAGNOSIS — G43109 Migraine with aura, not intractable, without status migrainosus: Secondary | ICD-10-CM

## 2016-08-28 DIAGNOSIS — R0789 Other chest pain: Secondary | ICD-10-CM | POA: Diagnosis not present

## 2016-08-28 DIAGNOSIS — E119 Type 2 diabetes mellitus without complications: Secondary | ICD-10-CM

## 2016-08-28 DIAGNOSIS — I10 Essential (primary) hypertension: Secondary | ICD-10-CM

## 2016-08-28 DIAGNOSIS — I110 Hypertensive heart disease with heart failure: Secondary | ICD-10-CM | POA: Diagnosis not present

## 2016-08-28 DIAGNOSIS — E7800 Pure hypercholesterolemia, unspecified: Secondary | ICD-10-CM

## 2016-08-28 DIAGNOSIS — I5032 Chronic diastolic (congestive) heart failure: Secondary | ICD-10-CM | POA: Diagnosis not present

## 2016-08-28 DIAGNOSIS — G459 Transient cerebral ischemic attack, unspecified: Secondary | ICD-10-CM | POA: Diagnosis not present

## 2016-08-28 DIAGNOSIS — I6300 Cerebral infarction due to thrombosis of unspecified precerebral artery: Secondary | ICD-10-CM | POA: Diagnosis not present

## 2016-08-28 LAB — HEMOGLOBIN A1C
HEMOGLOBIN A1C: 8.4 % — AB (ref 4.8–5.6)
Mean Plasma Glucose: 194 mg/dL

## 2016-08-28 LAB — BASIC METABOLIC PANEL
ANION GAP: 6 (ref 5–15)
BUN: 13 mg/dL (ref 6–20)
CALCIUM: 8.7 mg/dL — AB (ref 8.9–10.3)
CO2: 21 mmol/L — AB (ref 22–32)
Chloride: 112 mmol/L — ABNORMAL HIGH (ref 101–111)
Creatinine, Ser: 0.8 mg/dL (ref 0.44–1.00)
GFR calc non Af Amer: >60 mL/min (ref >60)
Glucose, Bld: 135 mg/dL — ABNORMAL HIGH (ref 65–99)
Potassium: 4.1 mmol/L (ref 3.5–5.1)
Sodium: 139 mmol/L (ref 135–145)

## 2016-08-28 LAB — TROPONIN I: Troponin I: <0.03 ng/mL (ref <0.03)

## 2016-08-28 LAB — GLUCOSE, CAPILLARY
GLUCOSE-CAPILLARY: 115 mg/dL — AB (ref 65–99)
GLUCOSE-CAPILLARY: 154 mg/dL — AB (ref 65–99)
GLUCOSE-CAPILLARY: 110 mg/dL — AB (ref 65–99)
Glucose-Capillary: 126 mg/dL — ABNORMAL HIGH (ref 65–99)
Glucose-Capillary: 190 mg/dL — ABNORMAL HIGH (ref 65–99)

## 2016-08-28 LAB — ECHOCARDIOGRAM COMPLETE
HEIGHTINCHES: 66 in
WEIGHTICAEL: 3068.8 [oz_av]

## 2016-08-28 LAB — MAGNESIUM: Magnesium: 1.5 mg/dL — ABNORMAL LOW (ref 1.7–2.4)

## 2016-08-28 NOTE — Care Management Obs Status (Signed)
MEDICARE OBSERVATION STATUS NOTIFICATION   Patient Details  Name: [name] [name] MRN: [id number] Date of Birth: [birth year]   Medicare Observation Status Notification Given:  Yes (MRI negative)    Kermit BaloKelli F Raffi Milstein, RN 08/28/2016, 12:33 PM

## 2016-08-28 NOTE — Progress Notes (Signed)
  Echocardiogram 2D Echocardiogram has been performed.  Arvil ChacoFoster, Maclovio Henson 08/28/2016, 1:43 PM

## 2016-08-28 NOTE — Progress Notes (Signed)
STROKE TEAM PROGRESS NOTE   SUBJECTIVE (INTERVAL HISTORY) No family members present. Her MRI negative for stroke and her symptoms resolved. She stated that she had similar episodes in the past, dated back to 45 - 1984. In the document we have, she had episode in 04/2012 and 11/2015. All MRI negative. She also stated that all those episodes associated with HA, most time HA comes after the left side weakness episodes. She has HA 1-2 times a week, lasting 1-2 hours and gone. She does not need to take medications for that. Denies aura.    OBJECTIVE Temp:  [97.9 F (36.6 C)-98.5 F (36.9 C)] 98.5 F (36.9 C) (10/09 0503) Pulse Rate:  [56-60] 58 (10/09 1001) Cardiac Rhythm: Sinus bradycardia (10/09 0700) Resp:  [20] 20 (10/09 0503) BP: (139-145)/(62-73) 144/73 (10/09 0503) SpO2:  [99 %] 99 % (10/09 0503)  CBC:   Recent Labs Lab 08/26/16 1937 08/26/16 1948  WBC 7.3  --   NEUTROABS 3.0  --   HGB 13.3 13.9  HCT 40.5 41.0  MCV 92.7  --   PLT 234  --     Basic Metabolic Panel:   Recent Labs Lab 08/27/16 0340 08/28/16 0633  NA 140 139  K 3.5 4.1  CL 109 112*  CO2 25 21*  GLUCOSE 85 135*  BUN 19 13  CREATININE 0.86 0.80  CALCIUM 9.1 8.7*  MG  --  1.5*    Lipid Panel:     Component Value Date/Time   CHOL 148 08/27/2016 0340   TRIG 72 08/27/2016 0340   HDL 38 (L) 08/27/2016 0340   CHOLHDL 3.9 08/27/2016 0340   VLDL 14 08/27/2016 0340   LDLCALC 96 08/27/2016 0340   HgbA1c:  Lab Results  Component Value Date   HGBA1C 8.4 (H) 08/27/2016   Urine Drug Screen:     Component Value Date/Time   LABOPIA NONE DETECTED 08/26/2016 2015   COCAINSCRNUR NONE DETECTED 08/26/2016 2015   LABBENZ NONE DETECTED 08/26/2016 2015   AMPHETMU NONE DETECTED 08/26/2016 2015   THCU NONE DETECTED 08/26/2016 2015   LABBARB NONE DETECTED 08/26/2016 2015      IMAGING I have personally reviewed the radiological images below and agree with the radiology interpretations.  Ct Head Code  Stroke W/o Cm 08/26/2016 1. No evidence of acute intracranial abnormality.  2. ASPECTS is 10.   MRI and MRA Brain Wo Contrast 08/27/2016 Chronic changes of atrophy and small vessel disease. No acute intracranial abnormality. Remote RIGHT insular infarct, but no cause seen for slurred speech. Unremarkable MRA of the intracranial circulation. Significant RIGHT maxillary paranasal sinus disease.  ENT consultation may be warranted.  CUS - Bilateral: 1-39% ICA stenosis. Vertebral artery flow is antegrade.  TTE - - Left ventricle: The cavity size was normal. There was mild   concentric hypertrophy. Systolic function was normal. The   estimated ejection fraction was in the range of 60% to 65%. Wall   motion was normal; there were no regional wall motion   abnormalities. Doppler parameters are consistent with abnormal   left ventricular relaxation (grade 1 diastolic dysfunction).   Doppler parameters are consistent with elevated ventricular   end-diastolic filling pressure. - Aortic valve: Trileaflet; normal thickness leaflets. There was no   regurgitation. - Aortic root: The aortic root was normal in size. - Mitral valve: Structurally normal valve. There was mild   regurgitation. - Left atrium: The atrium was normal in size. - Right ventricle: The cavity size was normal. Wall thickness was  normal. Systolic function was normal. - Right atrium: The atrium was normal in size. - Tricuspid valve: There was mild regurgitation. - Pulmonic valve: There was no regurgitation. - Pulmonary arteries: Systolic pressure was mildly increased. PA   peak pressure: 37 mm Hg (S). - Inferior vena cava: The vessel was normal in size. - Pericardium, extracardiac: There was no pericardial effusion.   PHYSICAL EXAM HEENT-  Normocephalic, no lesions, without obvious abnormality.  Normal external eye and conjunctiva.  Normal TM's bilaterally.  Normal auditory canals and external ears. Normal external nose,  mucus membranes and septum.  Normal pharynx. Neck supple with no masses, nodes, nodules or enlargement. Cardiovascular - regular rate and rhythm, S1, S2 normal, no murmur, click, rub or gallop Lungs - chest clear, no wheezing, rales, normal symmetric air entry Abdomen - soft, non-tender; bowel sounds normal; no masses,  no organomegaly Extremities - no joint deformities, effusion, or inflammation  Neurologic Examination: Mental Status: Alert, awake, oriented x 3. Normal language, comprehension, expression, naming and repetition. No evidence of aphasia. Able to follow commands without difficulty. Cranial Nerves: II-Visual fields were normal. III/IV/VI-Pupils were equal and reacted normally to light. Extraocular movements were full and conjugate.    V/VII- sensation symmetrical, no facial droop VIII-normal. X- no dysarthria. XI: trapezius strength/neck flexion strength normal bilaterally XII-midline tongue extension with normal strength. Motor: muscle strength 5/5 bilaterally; muscle tone was normal throughout. Sensory: symmetric bilaterally. Deep Tendon Reflexes: 1+ and symmetric. Plantars: Mute bilaterally Cerebellar: Normal finger-to-nose testing bilaterally   ASSESSMENT/PLAN [name] is a 70 y.o. female with history of a previous stroke on MRI, thoracic ascending aortic aneurysm, obesity, hypertension, diabetes mellitus, history of chest pain with previous normal cardiac catheterization in 2015 presenting with left hemiparesis and speech changes.  She did not receive IV t-PA due to late presentation.  Possible complicated migraine - pt multiple episodes of transient left side weakness and slurry speech in the setting of HA and hx of migraine - MRI negative every time.  Resultant deficit resolved  MRI - no acute infarct. Remote RIGHT insular infarct.  MRA - unremarkable MRA head  Carotid Doppler - 1-39% ICA plaquing. Vertebral artery flow is antegrade.   2D Echo EF  60-65%  Due to silence right insular infarct, recommend 30 day cardiac event monitoring as outpt to rule out afib  LDL - 96  HgbA1c - 8.4  VTE prophylaxis - Lovenox Diet heart healthy/carb modified Room service appropriate? Yes; Fluid consistency: Thin  aspirin 325 mg daily prior to admission, now on aspirin 325 mg daily. Continue ASA on discharge.  Patient counseled to be compliant with her antithrombotic medications  Ongoing aggressive stroke risk factor management  Therapy recommendations: Home health OT/PT   Disposition: Pending  Hx of migraine  Since 1983  1-2 per week, frontal HA, sharp pain, lasting 1-2 hours, no medication needed  Multiple left side weakness and dysarthria episodes followed by HA  MRI negative every time  Recommend tylenol for abortive treatment  Hypertension  Stable Long-term BP goal normotensive  Hyperlipidemia  Home meds:  Lipitor 10 mg daily resumed in hospital  LDL 96, goal < 70  Increase Lipitor to 20 mg daily  Continue statin at discharge  Diabetes   HgbA1c 8.4, goal < 7.0  Uncontrolled  Need close PCP follow up  Other Stroke Risk Factors  Advanced age  Former smoker - quit 37 years ago.  Obesity, Body mass index is 30.96 kg/m., recommend weight loss, diet and  exercise as appropriate   Other Active Problems  Significant RIGHT maxillary paranasal sinus disease. ENT consultation may be warranted   Hospital day # 0  Neurology will sign off. Please call with questions. [facility] 6 weeks. Thanks for the consult.  Marvel PlanJindong Deone Omahoney, MD PhD Stroke Neurology 08/28/2016 6:16 PM

## 2016-08-28 NOTE — Consult Note (Signed)
Patient ID: [name] [name] MRN: [id number], DOB/AGE: 71-31-1947   Admit date: 08/26/2016   Reason for Consult: Chest Pain  Requesting MD: Dr. Sharion DoveZapata, Family Medicine    Primary Physician: Verlon AuBoyd, Tammy [name], MD Primary Cardiologist: Dr. Anne FuSkains  Pt. Profile:  70 y/o female with h/o diabetes mellitus, obesity, hypertension, GERD and remote CVA, who presents with slurred speech. Admitted for CVA/TIA w/u. Cardiology consulted for CP.   Problem List  Past Medical History:  Diagnosis Date  . Chest pain 2009   MV with no scar or ischemia, EF 65%  . Diabetes mellitus   . GERD (gastroesophageal reflux disease)   . History of cardiac catheterization    a. Myoview 2/15 with anteroapical ischemia, EF 64% (intermediate risk) >> LHC - normal cos, EF 55-60%  . Hypertension   . Neuromuscular disorder (HCC)    arotic Aneurysm  . Obesity (BMI 35.0-39.9 without comorbidity)   . Pneumonia   . Stroke [facility](HCC)    with some resid L sided weakness  . Thoracic ascending aortic aneurysm (HCC)    a. CT 1/17: ascending aorta 4.2 x 4.0 cm    Past Surgical History:  Procedure Laterality Date  . ABDOMINAL HYSTERECTOMY  2006   Partial, one ovary left   . [place]  . CHOLECYSTECTOMY    . LEFT HEART CATHETERIZATION WITH CORONARY ANGIOGRAM N/A 01/09/2014   Procedure: LEFT HEART CATHETERIZATION WITH CORONARY ANGIOGRAM;  Surgeon: Marykay Lexavid W Harding, MD;  [facility];  Service: Cardiovascular;  Laterality: N/A;  . TEMPOROMANDIBULAR JOINT SURGERY    . WISDOM TOOTH EXTRACTION       Allergies  Allergies  Allergen Reactions  . Metformin Diarrhea  . Codeine Nausea And Vomiting  . Adhesive [Tape] Rash    pls use elastic wraps instead of adhesive tape    HPI 70 y/o AAF with a history of diabetes mellitus, obesity, hypertension, GERD and remote CVA, who presented with slurred speech on 08/27/16. Also c/o right-sided headache, and right side  chest pain.  CT of the head showed no intracranial process. Neurology was consulted, deemed not a candidate for TPA. She was admitted by IM for further w/u for suspected TIA vs stroke. MRI showed remote right insular infarct but no cause seen for her recent speech impairment. Carotid dopplers showed 1-39% bilateral ICA narrowing.  2D echo pending. Cardiology consulted for CP.  Troponin's are negative x 1. She has undergone ischemic w/u in the past as outlined below.  In 2013, she had a NST for CP that was normal w/o evidence of ischemia. She was admitted in 2015 for CP. A nuclear perfusion study was interpreted as intermediate risk with partially reversible mid-to distal antral apical defect as well as possible fixed inferior lateral attenuation artifact. Subsequently, she was referred for definitive LHC. LHC 01/09/14, performed by Dr. Tresa EndoKelly, showed normal coronaries and normal LVEF of 55-60% w/o focal segmental wall motion abnormalities. Her stress test was ruled a false positive stress test.   In January of this year, she was seen again in the ED for CP.  CT scan demonstrated possible RLL pneumonia.  Of note there was mild dilatation of the ascending thoracic aorta measuring 4.2 x 4 cm. Yearly f/u with CTA/MRA recommended. Her last echo was also in January of this year. EF was normal at 60-65%. Grade 1DD was noted. There were no WMA. The ascending aorta was noted to be mildly dilated. No  aortic stenosis nor regurgitation.   The patient's recent CP is described as a mix of typical and atypical features. She notes both sharp, shooting pain as well as substeranal chest tightness. Symptoms can occur at rest as well as with exertion. Non pleuritic. Not reproducible with palpation of chest wall. She has had some mild associated dyspnea. She also notes history of frequent tachy palpitations and dizziness. She is currently CP free. EKG shows sinus bradycardia with anterior T wave abnormalities. Telemetry has shown  NSR. No arrtthymias. Her neurologic deficits have resolved.   Home Medications  Prior to Admission medications   Medication Sig Start Date End Date Taking? Authorizing Provider  albuterol (PROVENTIL HFA;VENTOLIN HFA) 108 (90 BASE) MCG/ACT inhaler Inhale 1-2 puffs into the lungs every 6 (six) hours as needed for wheezing. 07/29/13  Yes Reuben Likes, MD  aspirin 325 MG tablet Take 1 tablet (325 mg total) by mouth daily. 12/09/15  Yes Ripudeep Jenna Luo, MD  atorvastatin (LIPITOR) 10 MG tablet Take 1 tablet (10 mg total) by mouth daily at 6 PM. 12/09/15  Yes Ripudeep K Rai, MD  Cholecalciferol (VITAMIN D3) 1000 units CAPS Take 1 capsule by mouth daily.   Yes Historical Provider, MD  glipiZIDE (GLUCOTROL) 5 MG tablet Take 5 mg by mouth 2 (two) times daily before a meal.    Yes Historical Provider, MD  lisinopril-hydrochlorothiazide (PRINZIDE,ZESTORETIC) 10-12.5 MG tablet TAKE 1 TABLET BY MOUTH DAILY 12/27/15  Yes Allie Bossier, MD  metoprolol succinate (TOPROL-XL) 50 MG 24 hr tablet TAKE 1 TABLET BY MOUTH DAILY( WITH OR IMMEDIATELY FOLLOWING A MEAL) 06/19/16  Yes Scott T Alben Spittle, PA-C  omeprazole (PRILOSEC) 40 MG capsule Take 40 mg by mouth daily.   Yes Historical Provider, MD  potassium chloride SA (K-DUR,KLOR-CON) 20 MEQ tablet Take 1 tablet (20 mEq total) by mouth daily. 12/03/15  Yes Scott Moishe Spice, PA-C  spironolactone (ALDACTONE) 25 MG tablet Take 12.5 mg by mouth daily.   Yes Historical Provider, MD  ibuprofen (ADVIL,MOTRIN) 200 MG tablet Take 200 mg by mouth every 6 (six) hours as needed for headache or mild pain.    Historical Provider, MD  ONE TOUCH ULTRA TEST test strip 1 each by Other route as needed (BLOOD GLUCOSE MONITORING).  10/20/15   Historical Provider, MD   Hospital Meds  .  stroke: mapping our early stages of recovery book   Does not apply Once  . aspirin  300 mg Rectal Daily   Or  . aspirin  325 mg Oral Daily  . atorvastatin  20 mg Oral q1800  . enoxaparin (LOVENOX) injection  40 mg  Subcutaneous Q24H  . insulin aspart  0-15 Units Subcutaneous Q4H  . metoprolol succinate  50 mg Oral Daily  . pantoprazole  40 mg Oral Daily    Family History  Family History  Problem Relation Age of Onset  . Heart disease Father   . Pancreatitis Mother   . Diabetes Maternal Grandmother   . Heart disease Brother     Social History  Social History   Social History  . Marital status: Married    Spouse name: N/A  . Number of children: N/A  . Years of education: N/A   Occupational History  . Caregiver for the elderly Retired   Social History Main Topics  . Smoking status: Former Smoker    Packs/day: 0.00    Years: 0.00  . Smokeless tobacco: Never Used  . Alcohol use No  . Drug use: No  .  Sexual activity: No   Other Topics Concern  . Not on file   Social History Narrative   Family History Details: Her mother died at age 47 of pancreatitis. Her father died of mitral valve disease at age 8. She has a brother who has had myocardial infarction and also has had bypass surgery and a sister who    has a pacemaker.           Review of Systems General:  No chills, fever, night sweats or weight changes.  Cardiovascular:  No chest pain, dyspnea on exertion, edema, orthopnea, palpitations, paroxysmal nocturnal dyspnea. Dermatological: No rash, lesions/masses Respiratory: No cough, dyspnea Urologic: No hematuria, dysuria Abdominal:   No nausea, vomiting, diarrhea, bright red blood per rectum, melena, or hematemesis Neurologic:  No visual changes, wkns, changes in mental status. All other systems reviewed and are otherwise negative except as noted above.  Physical Exam  Blood pressure (!) 144/73, pulse (!) 58, temperature 98.5 F (36.9 C), temperature source Oral, resp. rate 20, height 5\' 6"  (1.676 m), weight 191 lb 12.8 oz (87 kg), SpO2 99 %.  General: Pleasant, NAD Psych: Normal affect. Neuro: Alert and oriented X 3. Moves all extremities spontaneously. HEENT:  Normal  Neck: Supple without bruits or JVD. Lungs:  Resp regular and unlabored, CTA. Heart: RRR no s3, s4, or murmurs. Abdomen: Soft, non-tender, non-distended, BS + x 4.  Extremities: No clubbing, cyanosis or edema. DP/PT/Radials 2+ and equal bilaterally.  Labs  Troponin [facility] of Care Test)  Recent Labs  08/26/16 2009  TROPIPOC 0.00    Recent Labs  08/28/16 9604  TROPONINI <0.03   Lab Results  Component Value Date   WBC 7.3 08/26/2016   HGB 13.9 08/26/2016   HCT 41.0 08/26/2016   MCV 92.7 08/26/2016   PLT 234 08/26/2016    Recent Labs Lab 08/26/16 1937  08/28/16 0633  NA 137  < > 139  K 3.3*  < > 4.1  CL 105  < > 112*  CO2 20*  < > 21*  BUN 20  < > 13  CREATININE 1.23*  < > 0.80  CALCIUM 9.6  < > 8.7*  PROT 6.9  --   --   BILITOT 0.3  --   --   ALKPHOS 78  --   --   ALT 13*  --   --   AST 20  --   --   GLUCOSE 212*  < > 135*  < > = values in this interval not displayed. Lab Results  Component Value Date   CHOL 148 08/27/2016   HDL 38 (L) 08/27/2016   LDLCALC 96 08/27/2016   TRIG 72 08/27/2016   Lab Results  Component Value Date   DDIMER 0.56 (H) 11/25/2015     Radiology/Studies  Mr Angiogram Head Wo Contrast  Result Date: 08/27/2016 CLINICAL DATA:  New onset of slurred speech. History of previous stroke with LEFT-sided weakness. EXAM: MRI HEAD WITHOUT CONTRAST MRA HEAD WITHOUT CONTRAST TECHNIQUE: Multiplanar, multiecho pulse sequences of the brain and surrounding structures were obtained without intravenous contrast. Angiographic images of the head were obtained using MRA technique without contrast. COMPARISON:  MRI of 12/07/2015. FINDINGS: MRI HEAD FINDINGS Brain: No evidence for acute infarction, hemorrhage, mass lesion, hydrocephalus, or extra-axial fluid. Mild atrophy. T2 and FLAIR hyperintensities throughout the white matter representing small vessel disease. There is a remote area of cerebral infarction with gliosis and encephalomalacia  affecting the RIGHT insula. Vascular: Flow voids are maintained  throughout the carotid, basilar, and vertebral arteries. There are no areas of chronic hemorrhage. Skull and upper cervical spine: Unremarkable visualized calvarium, skullbase, and cervical vertebrae. Pituitary, pineal, cerebellar tonsils unremarkable. No upper cervical Throckmorton lesions. Sinuses/Orbits: No orbital findings of significance. Extensive chronic paranasal sinus disease of the RIGHT maxillary sinus. Slight expansion, minimal residual aeration, secondary chronic osteitis on CT. Other: None. MRA HEAD FINDINGS Dolichoectatic but widely patent internal carotid arteries. Basilar artery also widely patent with both vertebrals contributing, LEFT slightly larger. No proximal stenosis or irregularity of the middle cerebral artery M1 segments. Normal MCA bifurcation on the RIGHT and LEFT. Hypoplastic RIGHT A1 anterior cerebral artery. Azygos LEFT A1 ACA supplies both distal anterior cerebral arteries. No proximal PCA stenosis of significance. No cerebellar branch occlusion. No saccular aneurysm. IMPRESSION: Chronic changes of atrophy and small vessel disease. No acute intracranial abnormality. Remote RIGHT insular infarct, but no cause seen for slurred speech. Unremarkable MRA of the intracranial circulation. Significant RIGHT maxillary paranasal sinus disease. ENT consultation may be warranted. Electronically Signed   By: Elsie Stain M.D.   On: 08/27/2016 11:22   Mr Brain Wo Contrast  Result Date: 08/27/2016 CLINICAL DATA:  New onset of slurred speech. History of previous stroke with LEFT-sided weakness. EXAM: MRI HEAD WITHOUT CONTRAST MRA HEAD WITHOUT CONTRAST TECHNIQUE: Multiplanar, multiecho pulse sequences of the brain and surrounding structures were obtained without intravenous contrast. Angiographic images of the head were obtained using MRA technique without contrast. COMPARISON:  MRI of 12/07/2015. FINDINGS: MRI HEAD FINDINGS Brain: No  evidence for acute infarction, hemorrhage, mass lesion, hydrocephalus, or extra-axial fluid. Mild atrophy. T2 and FLAIR hyperintensities throughout the white matter representing small vessel disease. There is a remote area of cerebral infarction with gliosis and encephalomalacia affecting the RIGHT insula. Vascular: Flow voids are maintained throughout the carotid, basilar, and vertebral arteries. There are no areas of chronic hemorrhage. Skull and upper cervical spine: Unremarkable visualized calvarium, skullbase, and cervical vertebrae. Pituitary, pineal, cerebellar tonsils unremarkable. No upper cervical Saltos lesions. Sinuses/Orbits: No orbital findings of significance. Extensive chronic paranasal sinus disease of the RIGHT maxillary sinus. Slight expansion, minimal residual aeration, secondary chronic osteitis on CT. Other: None. MRA HEAD FINDINGS Dolichoectatic but widely patent internal carotid arteries. Basilar artery also widely patent with both vertebrals contributing, LEFT slightly larger. No proximal stenosis or irregularity of the middle cerebral artery M1 segments. Normal MCA bifurcation on the RIGHT and LEFT. Hypoplastic RIGHT A1 anterior cerebral artery. Azygos LEFT A1 ACA supplies both distal anterior cerebral arteries. No proximal PCA stenosis of significance. No cerebellar branch occlusion. No saccular aneurysm. IMPRESSION: Chronic changes of atrophy and small vessel disease. No acute intracranial abnormality. Remote RIGHT insular infarct, but no cause seen for slurred speech. Unremarkable MRA of the intracranial circulation. Significant RIGHT maxillary paranasal sinus disease. ENT consultation may be warranted. Electronically Signed   By: Elsie Stain M.D.   On: 08/27/2016 11:22   Ct Head Code Stroke W/o Cm  Result Date: 08/26/2016 CLINICAL DATA:  Code stroke. Left-sided weakness, headache, and slurred speech. History of stroke. EXAM: CT HEAD WITHOUT CONTRAST TECHNIQUE: Contiguous axial  images were obtained from the base of the skull through the vertex without intravenous contrast. COMPARISON:  Head CT and MRI 12/07/2015 FINDINGS: Brain: There is no evidence of acute cortical infarct, intracranial hemorrhage, mass, midline shift, or extra-axial fluid collection. Ventricles and sulci are within normal limits for age. Vascular: No hyperdense vessel. Minimal carotid siphon calcification. Skull: No fracture or focal osseous lesion.  Sinuses/Orbits: Partially visualized chronic right maxillary sinusitis with suspected fluid component as well. Clear mastoid air cells. Unremarkable orbits. Other: None. ASPECTS [facility] Stroke Program Early CT Score) - Ganglionic level infarction (caudate, lentiform nuclei, internal capsule, insula, M1-M3 cortex): 7 - Supraganglionic infarction (M4-M6 cortex): 3 Total score (0-10 with 10 being normal): 10 IMPRESSION: 1. No evidence of acute intracranial abnormality. 2. ASPECTS is 10. These results were called by telephone at the time of interpretation on 08/26/2016 at 7:56 pm to Dr. Roseanne Reno, who verbally acknowledged these results. Electronically Signed   By: Sebastian Ache M.D.   On: 08/26/2016 19:58    ECG  Sinus bradycardia. Anterior Twave abnormalities.   Echocardiogram -pending   ASSESSMENT AND PLAN  Principal Problem:   Cerebral infarction [facility]) Active Problems:   Type 2 diabetes mellitus without complication, without long-term current use of insulin (HCC)   Essential hypertension   Obesity due to excess calories   Chronic diastolic heart failure, NYHA class 2 (HCC)   TIA (transient ischemic attack)   Atypical chest pain   1. Cerebral Infarction: head CT and MRI negative for acute findings. Carotid dopplers show 1-39% bilateral ICA narrowing. 2D echo pending. Telemetry has shown NSR. No arrhthymias captured, however patient reports h/o tachy palpitations. Consider TEE to r/o cardio embolic source. If negative TEE, recommend 30 day monitor to r/o  afib/flutter. If no findings in 30 days, EP referral for implantable loop recorder for further monitoring. Antiplatelets vs a/c per neuro.  2. Chest Pain: mixed atypical and typical features. However patient had false positive NST in 2015, followed by normal LHC. Doubt she has developed obstructive CAD in this short time frame. Cycle troponin's x 3 and await 2D echo findings.    3. T2DM: per IM.   4. HTN: controlled. Per neuro and IM.   5. Chronic Diastolic CHF: volume appears stable on exam. 2D echo pending.    Signed, Robbie Lis, PA-C 08/28/2016, 1:28 PM  History and all data above reviewed.  Patient examined.  I agree with the findings as above.  The patient presents with chest pain that is atypical as described above.  There is no objective evidence of ischemia.  She has had a negative cardiac cath in the in 2015.  The patient exam reveals COR:RRR  ,  Lungs: Clear  ,  Abd: Positive bowel sounds, no rebound no guarding, Ext No edema  .  All available labs, radiology testing, previous records reviewed. Agree with documented assessment and plan. Chest pain:  This is atypica, without objective evidence of ischemia.  Echo is pending.  However, I don't anticipate any further in patient cardiac work up.    Fayrene Fearing Genea Rheaume  6:11 PM  08/28/2016

## 2016-08-28 NOTE — Progress Notes (Signed)
Physical Therapy Treatment Patient Details Name: [name] [name] MRN: [id number] DOB: 01/17/1946 Today's Date: 08/28/2016    History of Present Illness 70 y.o. female with a past medical history significant for NIDDM, HTN, and previous stroke with mild residual L sided weakness who presents with slurred speech. MRI on 10/8 neg for acute intracranial abnormality, did show remote RIGHT insular infarct.    PT Comments    Patient continues to demonstrate L side weakness and impaired balance. Pt also appeared to have impaired L peripheral vision. Ambulated with SPC and min A this session however pt will need to use RW upon d/c for safer ambulation. Continue to progress as tolerated with anticipated d/c home with HHPT and 24 hour supervision/assist from family.   Follow Up Recommendations  Home health PT;Supervision/Assistance - 24 hour     Equipment Recommendations  Rolling walker with 5" wheels    Recommendations for Other Services       Precautions / Restrictions Precautions Precautions: Fall Restrictions Weight Bearing Restrictions: No    Mobility  Bed Mobility               General bed mobility comments: Patient in chair  Transfers Overall transfer level: Needs assistance Equipment used: Straight cane Transfers: Sit to/from Stand Sit to Stand: Min assist         General transfer comment: cues for safe hand placement; assist for balance upon standing  Ambulation/Gait Ambulation/Gait assistance: Min assist Ambulation Distance (Feet): 140 Feet Assistive device: Straight cane Gait Pattern/deviations: Step-through pattern;Decreased stride length;Decreased step length - left;Decreased weight shift to left Gait velocity: decreased   General Gait Details: cues for sequencing of gait with use of AD and L heel strike; pt with slow, guarded movements and fearful of falling   Stairs Stairs: Yes Stairs assistance: Min assist Stair Management: One rail Left;Step to  pattern;Forwards;With cane Number of Stairs: 2 General stair comments: cues for sequencing and use of AD; AD and rail to descend; assist for balance   Wheelchair Mobility    Modified Rankin (Stroke Patients Only) Modified Rankin (Stroke Patients Only) Pre-Morbid Rankin Score: No significant disability Modified Rankin: Moderately severe disability     Balance             Standing balance-Leahy Scale: Poor                      Cognition Arousal/Alertness: Awake/alert Behavior During Therapy: WFL for tasks assessed/performed Overall Cognitive Status: Within Functional Limits for tasks assessed                      Exercises      General Comments General comments (skin integrity, edema, etc.): pt continues to demo L side weakness and impaired L peripheral vision      Pertinent Vitals/Pain Pain Assessment: No/denies pain    Home Living                      Prior Function            PT Goals (current goals can now be found in the care plan section) Acute Rehab PT Goals Patient Stated Goal: return home Progress towards PT goals: Progressing toward goals    Frequency    Min 4X/week      PT Plan Current plan remains appropriate    Co-evaluation             End of Session Equipment Utilized During  Treatment: Gait belt Activity Tolerance: Patient tolerated treatment well Patient left: in chair;with call bell/phone within reach;with chair alarm set     Time: 1610-9604 PT Time Calculation (min) (ACUTE ONLY): 23 min  Charges:  $Gait Training: 8-22 mins $Therapeutic Activity: 8-22 mins                    G Codes:      Derek Mound, PTA Pager: [phone]   08/28/2016, 12:02 PM

## 2016-08-28 NOTE — Progress Notes (Signed)
PROGRESS NOTE [facility]   [name]   ZOX:[id number] DOB: Apr 10, 1946  DOA: 08/26/2016 PCP: Verlon AuBoyd, [name] [name], MD   Brief Narrative:  [name] BlinksVirginia L [name] is a 70 y.o. 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 Also c/o right-sided headache, and right side chest pain. CT of the head showed no acute intracranial abnormality. Neurology was consulted, deemed not a candidate for TPA. Patient placed on observation for workup of TIA/stroke.  Subjective:  Patient seen and examined at bedside with neurologist. No family member present, continues to complaint at this time is chest pain this time at the left side with radiation to the arm, associated with SOB and palpitation. Pain lasted about 10 minutes this morning, and occurred at rest.   Assessment & Plan:  1. Acute Stroke vs TIA: MRI showed, remote right insular infarct but no cause seen for her speech chronic changes of atrophy and small vessel disease right maxillary paranasal sinuses disease. - Neuro recommendations appreciated -Neuro checks, NIHSS per protocol -Continue daily aspirin 325 mg -Continue statin -Follow-up Echocardiogram  -PT/OT/SLP consultation appreciated  2. Hypokalemia:  K in the normal lower limit - Resume home K supp -Repeat BMP -Check mag  3. HTN:  -Medications on hold -Continue BB  4. NIDDM:  -Hold glipizide -SSI every 4 hours  5. Atypical chest pain with some mix features -  TNI negative x 3, EKG with T wave inversion on the lateral leads. - Cardio consulted - recommendations appreciated  -Echo pending  DVT prophylaxis: Lovenox  Code Status: FULL  Family Communication: Daughter and son at bedside  Disposition Plan: Expect discharge within 1-2 days,   Consultants:   Neuro  Procedures:   ECHO pending  Antimicrobials:       Objective: Vitals:   08/27/16 2117 08/28/16 0210 08/28/16 0503 08/28/16 1001  BP: (!) 145/65 139/62 (!) 144/73   Pulse: (!) 56 60 60 (!) 58  Resp: 20 20 20    Temp: 98.1 F (36.7 C) 97.9 F (36.6 C) 98.5 F (36.9 C)   TempSrc: Oral Oral Oral   SpO2: 99% 99% 99%   Weight:      Height:        Intake/Output Summary (Last 24 hours) at 08/28/16 1024 Last data filed at 08/28/16 0900  Gross per 24 hour  Intake              360 ml  Output                0 ml  Net              360 ml   Filed Weights   08/26/16 1957 08/27/16 0012  Weight: 85 kg (187 lb 6.3 oz) 87 kg (191 lb 12.8 oz)    Examination:  General exam: Appears calm and comfortable  Respiratory system: Clear to auscultation. No wheezes,crackle or rhonchi Cardiovascular system: S1 & S2 heard, RRR. No JVD, murmurs, rubs or gallops, No wall tenderness  Gastrointestinal system: Abdomen is nondistended, soft and nontender. No organomegaly or masses felt. Central nervous system: Alert and oriented. Exam inconsistent, good grasp, Muscle tone flaccid , LE Leg drift, CN intact Extremities: No pedal edema. Symmetric,  Skin: No rashes, lesions or ulcers Psychiatry: Judgement and insight appear normal. Mood & affect flat.    Data Reviewed: I have personally reviewed following labs and imaging  studies  CBC:  Recent Labs Lab 08/26/16 1937 08/26/16 1948  WBC 7.3  --   NEUTROABS 3.0  --   HGB 13.3 13.9  HCT 40.5 41.0  MCV 92.7  --   PLT 234  --    Basic Metabolic Panel:  Recent Labs Lab 08/26/16 1937 08/26/16 1948 08/27/16 0340 08/28/16 0633  NA 137 140 140 139  K 3.3* 3.3* 3.5 4.1  CL 105 104 109 112*  CO2 20*  --  25 21*  GLUCOSE 212* 213* 85 135*  BUN 20 22* 19 13  CREATININE 1.23* 1.10* 0.86 0.80  CALCIUM 9.6  --  9.1 8.7*  MG  --   --   --  1.5*   GFR: Estimated Creatinine Clearance: 71.7 mL/min (by C-G formula based on SCr of 0.8 mg/dL). Liver Function Tests:  Recent Labs Lab 08/26/16 1937   AST 20  ALT 13*  ALKPHOS 78  BILITOT 0.3  PROT 6.9  ALBUMIN 3.5   No results for input(s): LIPASE, AMYLASE in the last 168 hours. No results for input(s): AMMONIA in the last 168 hours. Coagulation Profile:  Recent Labs Lab 08/26/16 1937  INR 1.01   Cardiac Enzymes:  Recent Labs Lab 08/28/16 0633  TROPONINI <0.03   BNP (last 3 results) No results for input(s): PROBNP in the last 8760 hours. HbA1C:  Recent Labs  08/27/16 0340  HGBA1C 8.4*   CBG:  Recent Labs Lab 08/27/16 1706 08/27/16 1954 08/27/16 2320 08/28/16 0330 08/28/16 0750  GLUCAP 208* 187* 120* 115* 126*   Lipid Profile:  Recent Labs  08/27/16 0340  CHOL 148  HDL 38*  LDLCALC 96  TRIG 72  CHOLHDL 3.9   Thyroid Function Tests: No results for input(s): TSH, T4TOTAL, FREET4, T3FREE, THYROIDAB in the last 72 hours. Anemia Panel: No results for input(s): VITAMINB12, FOLATE, FERRITIN, TIBC, IRON, RETICCTPCT in the last 72 hours. Sepsis Labs: No results for input(s): PROCALCITON, LATICACIDVEN in the last 168 hours.  No results found for this or any previous visit (from the past 240 hour(s)).    Radiology Studies: Mr Angiogram Head Wo Contrast  Result Date: 08/27/2016 CLINICAL DATA:  New onset of slurred speech. History of previous stroke with LEFT-sided weakness. EXAM: MRI HEAD WITHOUT CONTRAST MRA HEAD WITHOUT CONTRAST TECHNIQUE: Multiplanar, multiecho pulse sequences of the brain and surrounding structures were obtained without intravenous contrast. Angiographic images of the head were obtained using MRA technique without contrast. COMPARISON:  MRI of 12/07/2015. FINDINGS: MRI HEAD FINDINGS Brain: No evidence for acute infarction, hemorrhage, mass lesion, hydrocephalus, or extra-axial fluid. Mild atrophy. T2 and FLAIR hyperintensities throughout the white matter representing small vessel disease. There is a remote area of cerebral infarction with gliosis and encephalomalacia affecting the RIGHT  insula. Vascular: Flow voids are maintained throughout the carotid, basilar, and vertebral arteries. There are no areas of chronic hemorrhage. Skull and upper cervical spine: Unremarkable visualized calvarium, skullbase, and cervical vertebrae. Pituitary, pineal, cerebellar tonsils unremarkable. No upper cervical Damas lesions. Sinuses/Orbits: No orbital findings of significance. Extensive chronic paranasal sinus disease of the RIGHT maxillary sinus. Slight expansion, minimal residual aeration, secondary chronic osteitis on CT. Other: None. MRA HEAD FINDINGS Dolichoectatic but widely patent internal carotid arteries. Basilar artery also widely patent with both vertebrals contributing, LEFT slightly larger. No proximal stenosis or irregularity of the middle cerebral artery M1 segments. Normal MCA bifurcation on the RIGHT and LEFT. Hypoplastic RIGHT A1 anterior cerebral artery. Azygos LEFT A1 ACA supplies both distal anterior cerebral  arteries. No proximal PCA stenosis of significance. No cerebellar branch occlusion. No saccular aneurysm. IMPRESSION: Chronic changes of atrophy and small vessel disease. No acute intracranial abnormality. Remote RIGHT insular infarct, but no cause seen for slurred speech. Unremarkable MRA of the intracranial circulation. Significant RIGHT maxillary paranasal sinus disease. ENT consultation may be warranted. Electronically Signed   By: Elsie Stain M.D.   On: 08/27/2016 11:22   Mr Brain Wo Contrast  Result Date: 08/27/2016 CLINICAL DATA:  New onset of slurred speech. History of previous stroke with LEFT-sided weakness. EXAM: MRI HEAD WITHOUT CONTRAST MRA HEAD WITHOUT CONTRAST TECHNIQUE: Multiplanar, multiecho pulse sequences of the brain and surrounding structures were obtained without intravenous contrast. Angiographic images of the head were obtained using MRA technique without contrast. COMPARISON:  MRI of 12/07/2015. FINDINGS: MRI HEAD FINDINGS Brain: No evidence for acute  infarction, hemorrhage, mass lesion, hydrocephalus, or extra-axial fluid. Mild atrophy. T2 and FLAIR hyperintensities throughout the white matter representing small vessel disease. There is a remote area of cerebral infarction with gliosis and encephalomalacia affecting the RIGHT insula. Vascular: Flow voids are maintained throughout the carotid, basilar, and vertebral arteries. There are no areas of chronic hemorrhage. Skull and upper cervical spine: Unremarkable visualized calvarium, skullbase, and cervical vertebrae. Pituitary, pineal, cerebellar tonsils unremarkable. No upper cervical Dax lesions. Sinuses/Orbits: No orbital findings of significance. Extensive chronic paranasal sinus disease of the RIGHT maxillary sinus. Slight expansion, minimal residual aeration, secondary chronic osteitis on CT. Other: None. MRA HEAD FINDINGS Dolichoectatic but widely patent internal carotid arteries. Basilar artery also widely patent with both vertebrals contributing, LEFT slightly larger. No proximal stenosis or irregularity of the middle cerebral artery M1 segments. Normal MCA bifurcation on the RIGHT and LEFT. Hypoplastic RIGHT A1 anterior cerebral artery. Azygos LEFT A1 ACA supplies both distal anterior cerebral arteries. No proximal PCA stenosis of significance. No cerebellar branch occlusion. No saccular aneurysm. IMPRESSION: Chronic changes of atrophy and small vessel disease. No acute intracranial abnormality. Remote RIGHT insular infarct, but no cause seen for slurred speech. Unremarkable MRA of the intracranial circulation. Significant RIGHT maxillary paranasal sinus disease. ENT consultation may be warranted. Electronically Signed   By: Elsie Stain M.D.   On: 08/27/2016 11:22   Ct Head Code Stroke W/o Cm  Result Date: 08/26/2016 CLINICAL DATA:  Code stroke. Left-sided weakness, headache, and slurred speech. History of stroke. EXAM: CT HEAD WITHOUT CONTRAST TECHNIQUE: Contiguous axial images were obtained  from the base of the skull through the vertex without intravenous contrast. COMPARISON:  Head CT and MRI 12/07/2015 FINDINGS: Brain: There is no evidence of acute cortical infarct, intracranial hemorrhage, mass, midline shift, or extra-axial fluid collection. Ventricles and sulci are within normal limits for age. Vascular: No hyperdense vessel. Minimal carotid siphon calcification. Skull: No fracture or focal osseous lesion. Sinuses/Orbits: Partially visualized chronic right maxillary sinusitis with suspected fluid component as well. Clear mastoid air cells. Unremarkable orbits. Other: None. ASPECTS [facility] Stroke Program Early CT Score) - Ganglionic level infarction (caudate, lentiform nuclei, internal capsule, insula, M1-M3 cortex): 7 - Supraganglionic infarction (M4-M6 cortex): 3 Total score (0-10 with 10 being normal): 10 IMPRESSION: 1. No evidence of acute intracranial abnormality. 2. ASPECTS is 10. These results were called by telephone at the time of interpretation on 08/26/2016 at 7:56 pm to Dr. Roseanne Reno, who verbally acknowledged these results. Electronically Signed   By: Sebastian Ache M.D.   On: 08/26/2016 19:58      Scheduled Meds: .  stroke: mapping our early stages of recovery  book   Does not apply Once  . aspirin  300 mg Rectal Daily   Or  . aspirin  325 mg Oral Daily  . atorvastatin  20 mg Oral q1800  . enoxaparin (LOVENOX) injection  40 mg Subcutaneous Q24H  . insulin aspart  0-15 Units Subcutaneous Q4H  . metoprolol succinate  50 mg Oral Daily  . pantoprazole  40 mg Oral Daily   Continuous Infusions: . 0.9 % NaCl with KCl 20 mEq / L 125 mL/hr at 08/28/16 0321     LOS: 0 days    Latrelle Dodrill, MD [facility] Pager [phone]  If 7PM-7AM, please contact night-coverage [email or web address] Password [email or web address] 08/28/2016, 10:24 AM

## 2016-08-28 NOTE — Progress Notes (Signed)
Occupational Therapy Treatment Patient Details Name: [name] [name] MRN: [id number] DOB: [birth year] Today's Date: 08/28/2016    History of present illness 70 y.o. female with a past medical history significant for NIDDM, HTN, and previous stroke with mild residual L sided weakness who presents with slurred speech. MRI on 10/8 neg for acute intracranial abnormality, did show remote RIGHT insular infarct.   OT comments  Next session to focus on dynamic environment with peripheral visual changes and to further assess balance. Pt in agreement and demonstrates MOD I level bed mobility.   Follow Up Recommendations  Home health OT;Supervision/Assistance - 24 hour    Equipment Recommendations  Tub/shower seat    Recommendations for Other Services PT consult    Precautions / Restrictions Precautions Precautions: Fall Restrictions Weight Bearing Restrictions: No       Mobility Bed Mobility Overal bed mobility: Modified Independent             General bed mobility comments: Patient in chair  Transfers Overall transfer level: Needs assistance Equipment used: Straight cane Transfers: Sit to/from Stand Sit to Stand: Min assist         General transfer comment: cues for safe hand placement; assist for balance upon standing    Balance   Sitting-balance support: No upper extremity supported;Feet supported Sitting balance-Leahy Scale: Normal       Standing balance-Leahy Scale: Poor                     ADL                                         General ADL Comments: Pt with visual deficits noted below. Pt able to complete bed mobility mod I       Vision Eye Alignment: Within Functional Limits Alignment/Gaze Preference: Within Defined Limits Ocular Range of Motion: Within Functional Limits Tracking/Visual Pursuits: Able to track stimulus in all quads without difficulty         Additional Comments: Pt noted to have nystagmus with L eye  with horizontal eye tracking. pt reports blurred vision with nystagmus present. pt reports incr blurred vision in L low quadrant compared to all other quadrants. pt with deficits noted greater than 30 degrees perpherial visual fields. pt noted to have visual field deficits on previous admission . Pt able to draw clock, cut line in half, located by scanning letters, able to locate letter in abstract cluttered environment. Pt required incr time but able to complete correctly. Pt advised on balance with visual changes. Pt reports "i have noticed that my vision is bad if my sugar is bad" Pt reports high blood sugar and changes in vision correlated and pt is Diabetic.    Perception     Praxis      Cognition   Behavior During Therapy: WFL for tasks assessed/performed Overall Cognitive Status: Within Functional Limits for tasks assessed                       Extremity/Trunk Assessment               Exercises     Shoulder Instructions       General Comments      Pertinent Vitals/ Pain       Pain Assessment: No/denies pain  Home Living  Prior Functioning/Environment              Frequency  Min 2X/week        Progress Toward Goals  OT Goals(current goals can now be found in the care plan section)  Progress towards OT goals: Progressing toward goals  Acute Rehab OT Goals Patient Stated Goal: return home OT Goal Formulation: With patient/family Time For Goal Achievement: 09/10/16 Potential to Achieve Goals: Good ADL Goals Pt Will Perform Grooming: with supervision;standing Pt Will Transfer to Toilet: with supervision;ambulating;bedside commode Pt Will Perform Toileting - Clothing Manipulation and hygiene: with supervision;sit to/from stand Pt Will Perform Tub/Shower Transfer: Tub transfer;with supervision;ambulating;shower seat;rolling walker Pt/caregiver will Perform Home Exercise Program: Increased  strength;Left upper extremity;Independently;With written HEP provided Additional ADL Goal #1: Pt will correctly identify 5/5 objects in L visual field without verbal cues.  Plan Discharge plan remains appropriate    Co-evaluation                 End of Session     Activity Tolerance Patient tolerated treatment well   Patient Left in bed;with call bell/phone within reach;with bed alarm set   Nurse Communication Mobility status    Functional Assessment Tool Used: Clinical judgement Functional Limitation: Self care Self Care Current Status (Q6578(G8987): At least 20 percent but less than 40 percent impaired, limited or restricted Self Care Goal Status (I6962(G8988): At least 1 percent but less than 20 percent impaired, limited or restricted   Time: 1414-1429 OT Time Calculation (min): 15 min  Charges: OT G-codes **NOT FOR INPATIENT CLASS** Functional Assessment Tool Used: Clinical judgement Functional Limitation: Self care Self Care Current Status (X5284(G8987): At least 20 percent but less than 40 percent impaired, limited or restricted Self Care Goal Status (X3244(G8988): At least 1 percent but less than 20 percent impaired, limited or restricted OT General Charges $OT Visit: 1 Procedure OT Treatments $Therapeutic Activity: 8-22 mins  Boone [name], [name] Sabado B 08/28/2016, 2:51 PM   Mateo [name], [name]   OTR/L Pager: [phone] Office: [phone] .

## 2016-08-29 ENCOUNTER — Other Ambulatory Visit: Payer: Self-pay | Admitting: Physician Assistant

## 2016-08-29 DIAGNOSIS — R0789 Other chest pain: Secondary | ICD-10-CM | POA: Diagnosis not present

## 2016-08-29 DIAGNOSIS — G43109 Migraine with aura, not intractable, without status migrainosus: Secondary | ICD-10-CM | POA: Diagnosis not present

## 2016-08-29 DIAGNOSIS — J329 Chronic sinusitis, unspecified: Secondary | ICD-10-CM

## 2016-08-29 DIAGNOSIS — I10 Essential (primary) hypertension: Secondary | ICD-10-CM | POA: Diagnosis not present

## 2016-08-29 DIAGNOSIS — I639 Cerebral infarction, unspecified: Secondary | ICD-10-CM

## 2016-08-29 LAB — CBC WITH DIFFERENTIAL/PLATELET
BASOS ABS: 0 10*3/uL (ref 0.0–0.1)
BASOS PCT: 0 %
EOS ABS: 0.2 10*3/uL (ref 0.0–0.7)
EOS PCT: 4 %
HCT: 39.9 % (ref 36.0–46.0)
HEMOGLOBIN: 12.6 g/dL (ref 12.0–15.0)
LYMPHS ABS: 2.8 10*3/uL (ref 0.7–4.0)
Lymphocytes Relative: 47 %
MCH: 29.6 pg (ref 26.0–34.0)
MCHC: 31.6 g/dL (ref 30.0–36.0)
MCV: 93.7 fL (ref 78.0–100.0)
Monocytes Absolute: 0.3 10*3/uL (ref 0.1–1.0)
Monocytes Relative: 5 %
NEUTROS PCT: 44 %
Neutro Abs: 2.6 10*3/uL (ref 1.7–7.7)
PLATELETS: 224 10*3/uL (ref 150–400)
RBC: 4.26 MIL/uL (ref 3.87–5.11)
RDW: 13.8 % (ref 11.5–15.5)
WBC: 6 10*3/uL (ref 4.0–10.5)

## 2016-08-29 LAB — BASIC METABOLIC PANEL
ANION GAP: 7 (ref 5–15)
BUN: 9 mg/dL (ref 6–20)
CHLORIDE: 110 mmol/L (ref 101–111)
CO2: 22 mmol/L (ref 22–32)
Calcium: 9.2 mg/dL (ref 8.9–10.3)
Creatinine, Ser: 0.85 mg/dL (ref 0.44–1.00)
GFR calc non Af Amer: >60 mL/min (ref >60)
Glucose, Bld: 113 mg/dL — ABNORMAL HIGH (ref 65–99)
POTASSIUM: 3.9 mmol/L (ref 3.5–5.1)
SODIUM: 139 mmol/L (ref 135–145)

## 2016-08-29 LAB — GLUCOSE, CAPILLARY
GLUCOSE-CAPILLARY: 102 mg/dL — AB (ref 65–99)
GLUCOSE-CAPILLARY: 137 mg/dL — AB (ref 65–99)
GLUCOSE-CAPILLARY: 204 mg/dL — AB (ref 65–99)
Glucose-Capillary: 138 mg/dL — ABNORMAL HIGH (ref 65–99)

## 2016-08-29 MED ORDER — ACETAMINOPHEN 325 MG PO TABS: 650.0000 mg | ORAL_TABLET | ORAL | 0 refills | Status: DC | PRN

## 2016-08-29 MED ORDER — WHITE PETROLATUM GEL
Status: AC
  Administered 2016-08-29: 1
  Filled 2016-08-29: qty 1

## 2016-08-29 MED ORDER — ASPIRIN 325 MG PO TABS: 325.0000 mg | ORAL_TABLET | Freq: Every day | ORAL | 4 refills | Status: DC

## 2016-08-29 NOTE — Progress Notes (Signed)
Patient given discharge instructions, all questions and concerns addressed. IV removed per order. Assessment stable. Left facility with all belongings in wheelchair accompanied by staff.

## 2016-08-29 NOTE — Discharge Summary (Addendum)
Physician Discharge Summary  [name]  ZOX:[id number]  DOB: 1946-01-13  DOA: 08/26/2016 PCP: [name] Au, MD  Admit date: 08/26/2016 Discharge date: 08/29/2016  Admitted From: Home Disposition:  Home  Recommendations for Outpatient Follow-up:  1. Follow up with PCP in 1-2 weeks 2. Please obtain BMP/CBC in one week 3. Please follow up with cardiology for 30 days cardiac monitor.  Home Health: Yes  Equipment/Devices: None  Discharge Condition: Stable  CODE STATUS: Full Diet recommendation: Heart Healthy / Carb Modified   Brief/Interim Summary: [name] [name] is a 70 y.o. 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 Also c/o right-sided headache, and right side chest pain. CT of the head showed no acute intracranial abnormality. Neurology was consulted, deemed not a candidate for TPA. Patient placed on observation for workup of TIA/stroke. MRI negative for Acute stroke, and symptoms has resolved. Patient was also c/o somewhat typical symptoms of chest pain for what cardiology was consulted, TNIs neg x 3, No acute changes on EKG and ECHO within normal limits. Neurology recommended 30 days cardiac monitor to rule out arrhythmia given recurrent episodes. Patient was evaluated by PT, whom recommended HHA and PT/OT.   Subjective:  Patient seen and examined at bedside, report improving on her symptoms, and chest pain resolved. No other complaints.   Discharge Diagnoses:   Complicated migraine - pt multiple episodes of transient left side weakness and slurry speech in the setting of HA and hx of migraine - MRI negative for acute infarct. Remote RIGHT insular infarct.  Due to silence right insular infarct - 30 day cardiac monitor was recommended, cardiology department will contact patient  Continue ASA 325 mg  Follow up with PCP  May need  prophylaxis therapy for migraines   Atypical Chest Pain: mixed atypical and typical features. However patient had false positive NST in 2015, followed by normal LHC. Doubt she has developed obstructive CAD in this short time frame. Troponin's  Neg x 3 and ECHO within normal limits.  Continue ASA Follow up with PCP   Hx of migraine - Since 1983, 1-2 per week, frontal HA, sharp pain, lasting 1-2 hours, no medication needed. Multiple left side weakness and dysarthria episodes followed by [name] [name], MRI negative every time Recommend tylenol for abortive treatment Follow up with PCP  Significant RIGHT maxillary paranasal sinus disease, on MRI - Asymptomatic  ENT referral given    Hypertension   Stable, but might need tighter control Continue home medication Monitor BP   Hyperlipidemia - ASCVD risk 25.5% Continue statin at discharge Consider increase to high intensity statin therapy give risk as out patient.   Diabetes Type 2  HgbA1c 8.4, goal < 7.0 Uncontrolled Resume glipizide - monitor for signs of hypoglycemia  Need close PCP follow up  Discharge Instructions  Discharge Instructions    Ambulatory referral to ENT    Complete by:  As directed    Ambulatory referral to Neurology    Complete by:  As directed    Follow up with NP [name] [name] at [facility] in about 2 months. Thanks.   Call MD for:  difficulty breathing, headache or visual disturbances    Complete by:  As directed    Call MD for:  extreme fatigue    Complete by:  As directed    Call MD for:  hives    Complete by:  As directed    Call MD for:  persistant dizziness or light-headedness    Complete by:  As directed    Call MD for:  persistant nausea and vomiting    Complete by:  As directed    Call MD for:  redness, tenderness, or signs of infection (pain, swelling, redness, odor or green/yellow discharge around incision site)    Complete by:  As directed    Call MD for:  severe uncontrolled pain    Complete by:  As  directed    Call MD for:  temperature >100.4    Complete by:  As directed    Diet - low sodium heart healthy    Complete by:  As directed    Discharge instructions    Complete by:  As directed    Discharge instructions    Complete by:  As directed    30 day cardiac monitor - cardiology office will contact you with instructions  Continue Aspirin 325mg   Follow up with PMD in 3-5 days Follow up with Neurology in 6 weeks   Increase activity slowly    Complete by:  As directed        Medication List    TAKE these medications   acetaminophen 325 MG tablet Commonly known as:  TYLENOL Take 2 tablets (650 mg total) by mouth every 4 (four) hours as needed for mild pain (or temp >/= 99.5 F).   albuterol 108 (90 Base) MCG/ACT inhaler Commonly known as:  PROVENTIL HFA;VENTOLIN HFA Inhale 1-2 puffs into the lungs every 6 (six) hours as needed for wheezing.   aspirin 325 MG tablet Take 1 tablet (325 mg total) by mouth daily.   atorvastatin 10 MG tablet Commonly known as:  LIPITOR Take 1 tablet (10 mg total) by mouth daily at 6 PM.   glipiZIDE 5 MG tablet Commonly known as:  GLUCOTROL Take 5 mg by mouth 2 (two) times daily before a meal.   ibuprofen 200 MG tablet Commonly known as:  ADVIL,MOTRIN Take 200 mg by mouth every 6 (six) hours as needed for headache or mild pain.   lisinopril-hydrochlorothiazide 10-12.5 MG tablet Commonly known as:  PRINZIDE,ZESTORETIC TAKE 1 TABLET BY MOUTH DAILY   metoprolol succinate 50 MG 24 hr tablet Commonly known as:  TOPROL-XL TAKE 1 TABLET BY MOUTH DAILY( WITH OR IMMEDIATELY FOLLOWING A MEAL)   omeprazole 40 MG capsule Commonly known as:  PRILOSEC Take 40 mg by mouth daily.   ONE TOUCH ULTRA TEST test strip Generic drug:  glucose blood 1 each by Other route as needed (BLOOD GLUCOSE MONITORING).   potassium chloride SA 20 MEQ tablet Commonly known as:  K-DUR,KLOR-CON Take 1 tablet (20 mEq total) by mouth daily.   spironolactone 25 MG  tablet Commonly known as:  ALDACTONE Take 12.5 mg by mouth daily.   Vitamin D3 1000 units Caps Take 1 capsule by mouth daily.      Follow-up Information    [name] Riggs, NP. Schedule an appointment as soon as possible for a visit in 6 week(s).   Specialty:  Family Medicine Contact information: [address] [place] [address] [phone]          Allergies  Allergen Reactions  . Metformin Diarrhea  . Codeine Nausea And Vomiting  . Adhesive [Tape] Rash    pls use elastic wraps instead of adhesive tape    Consultations:  Neurology - Stroke team  Cardiology - Dr. Antoine Poche   Procedures/Studies: Mr Angiogram Head  Wo Contrast  Result Date: 08/27/2016 CLINICAL DATA:  New onset of slurred speech. History of previous stroke with LEFT-sided weakness. EXAM: MRI HEAD WITHOUT CONTRAST MRA HEAD WITHOUT CONTRAST TECHNIQUE: Multiplanar, multiecho pulse sequences of the brain and surrounding structures were obtained without intravenous contrast. Angiographic images of the head were obtained using MRA technique without contrast. COMPARISON:  MRI of 12/07/2015. FINDINGS: MRI HEAD FINDINGS Brain: No evidence for acute infarction, hemorrhage, mass lesion, hydrocephalus, or extra-axial fluid. Mild atrophy. T2 and FLAIR hyperintensities throughout the white matter representing small vessel disease. There is a remote area of cerebral infarction with gliosis and encephalomalacia affecting the RIGHT insula. Vascular: Flow voids are maintained throughout the carotid, basilar, and vertebral arteries. There are no areas of chronic hemorrhage. Skull and upper cervical spine: Unremarkable visualized calvarium, skullbase, and cervical vertebrae. Pituitary, pineal, cerebellar tonsils unremarkable. No upper cervical Taff lesions. Sinuses/Orbits: No orbital findings of significance. Extensive chronic paranasal sinus disease of the RIGHT maxillary sinus. Slight expansion, minimal residual  aeration, secondary chronic osteitis on CT. Other: None. MRA HEAD FINDINGS Dolichoectatic but widely patent internal carotid arteries. Basilar artery also widely patent with both vertebrals contributing, LEFT slightly larger. No proximal stenosis or irregularity of the middle cerebral artery M1 segments. Normal MCA bifurcation on the RIGHT and LEFT. Hypoplastic RIGHT A1 anterior cerebral artery. Azygos LEFT A1 ACA supplies both distal anterior cerebral arteries. No proximal PCA stenosis of significance. No cerebellar branch occlusion. No saccular aneurysm. IMPRESSION: Chronic changes of atrophy and small vessel disease. No acute intracranial abnormality. Remote RIGHT insular infarct, but no cause seen for slurred speech. Unremarkable MRA of the intracranial circulation. Significant RIGHT maxillary paranasal sinus disease. ENT consultation may be warranted. Electronically Signed   By: Elsie Stain M.D.   On: 08/27/2016 11:22   Mr Brain Wo Contrast  Result Date: 08/27/2016 CLINICAL DATA:  New onset of slurred speech. History of previous stroke with LEFT-sided weakness. EXAM: MRI HEAD WITHOUT CONTRAST MRA HEAD WITHOUT CONTRAST TECHNIQUE: Multiplanar, multiecho pulse sequences of the brain and surrounding structures were obtained without intravenous contrast. Angiographic images of the head were obtained using MRA technique without contrast. COMPARISON:  MRI of 12/07/2015. FINDINGS: MRI HEAD FINDINGS Brain: No evidence for acute infarction, hemorrhage, mass lesion, hydrocephalus, or extra-axial fluid. Mild atrophy. T2 and FLAIR hyperintensities throughout the white matter representing small vessel disease. There is a remote area of cerebral infarction with gliosis and encephalomalacia affecting the RIGHT insula. Vascular: Flow voids are maintained throughout the carotid, basilar, and vertebral arteries. There are no areas of chronic hemorrhage. Skull and upper cervical spine: Unremarkable visualized calvarium,  skullbase, and cervical vertebrae. Pituitary, pineal, cerebellar tonsils unremarkable. No upper cervical Valone lesions. Sinuses/Orbits: No orbital findings of significance. Extensive chronic paranasal sinus disease of the RIGHT maxillary sinus. Slight expansion, minimal residual aeration, secondary chronic osteitis on CT. Other: None. MRA HEAD FINDINGS Dolichoectatic but widely patent internal carotid arteries. Basilar artery also widely patent with both vertebrals contributing, LEFT slightly larger. No proximal stenosis or irregularity of the middle cerebral artery M1 segments. Normal MCA bifurcation on the RIGHT and LEFT. Hypoplastic RIGHT A1 anterior cerebral artery. Azygos LEFT A1 ACA supplies both distal anterior cerebral arteries. No proximal PCA stenosis of significance. No cerebellar branch occlusion. No saccular aneurysm. IMPRESSION: Chronic changes of atrophy and small vessel disease. No acute intracranial abnormality. Remote RIGHT insular infarct, but no cause seen for slurred speech. Unremarkable MRA of the intracranial circulation. Significant RIGHT maxillary paranasal sinus disease. ENT consultation may  be warranted. Electronically Signed   By: Elsie Stain M.D.   On: 08/27/2016 11:22   Ct Head Code Stroke W/o Cm  Result Date: 08/26/2016 CLINICAL DATA:  Code stroke. Left-sided weakness, headache, and slurred speech. History of stroke. EXAM: CT HEAD WITHOUT CONTRAST TECHNIQUE: Contiguous axial images were obtained from the base of the skull through the vertex without intravenous contrast. COMPARISON:  Head CT and MRI 12/07/2015 FINDINGS: Brain: There is no evidence of acute cortical infarct, intracranial hemorrhage, mass, midline shift, or extra-axial fluid collection. Ventricles and sulci are within normal limits for age. Vascular: No hyperdense vessel. Minimal carotid siphon calcification. Skull: No fracture or focal osseous lesion. Sinuses/Orbits: Partially visualized chronic right maxillary  sinusitis with suspected fluid component as well. Clear mastoid air cells. Unremarkable orbits. Other: None. ASPECTS [facility] St[facility] Early CT Score) - Ganglionic level infarction (caudate, lentiform nuclei, internal capsule, insula, M1-M3 cortex): 7 - Supraganglionic infarction (M4-M6 cortex): 3 Total score (0-10 with 10 being normal): 10 IMPRESSION: 1. No evidence of acute intracranial abnormality. 2. ASPECTS is 10. These results were called by telephone at the time of interpretation on 08/26/2016 at 7:56 pm to Dr. Roseanne Reno, who verbally acknowledged these results. Electronically Signed   By: Sebastian Ache M.D.   On: 08/26/2016 19:58   ECHO Study Conclusions  - Left ventricle: The cavity size was normal. There was mild   concentric hypertrophy. Systolic function was normal. The   estimated ejection fraction was in the range of 60% to 65%. Wall   motion was normal; there were no regional wall motion   abnormalities. Doppler parameters are consistent with abnormal   left ventricular relaxation (grade 1 diastolic dysfunction).   Doppler parameters are consistent with elevated ventricular   end-diastolic filling pressure. - Aortic valve: Trileaflet; normal thickness leaflets. There was no   regurgitation. - Aortic root: The aortic root was normal in size. - Mitral valve: Structurally normal valve. There was mild   regurgitation. - Left atrium: The atrium was normal in size. - Right ventricle: The cavity size was normal. Wall thickness was   normal. Systolic function was normal. - Right atrium: The atrium was normal in size. - Tricuspid valve: There was mild regurgitation. - Pulmonic valve: There was no regurgitation. - Pulmonary arteries: Systolic pressure was mildly increased. PA   peak pressure: 37 mm Hg (S). - Inferior vena cava: The vessel was normal in size. - Pericardium, extracardiac: There was no pericardial effusion   Discharge Exam: Vitals:   08/29/16 1027 08/29/16 1343   BP: (!) 140/94 (!) 147/83  Pulse: 90 65  Resp: 18 18  Temp: 98 F (36.7 C) 98.2 F (36.8 C)   Vitals:   08/29/16 0044 08/29/16 0604 08/29/16 1027 08/29/16 1343  BP: (!) 165/76 (!) 182/96 (!) 140/94 (!) 147/83  Pulse: 60 80 90 65  Resp: 18 18 18 18   Temp: 98 F (36.7 C) 97.7 F (36.5 C) 98 F (36.7 C) 98.2 F (36.8 C)  TempSrc: Oral Oral Oral Oral  SpO2: 97% 100% 100% 99%  Weight:      Height:        General: Pt is alert, awake, not in acute distress Cardiovascular: RRR, S1/S2 +, no rubs, no gallops Respiratory: CTA bilaterally, no wheezing, no rhonchi Abdominal: Soft, NT, ND, bowel sounds + Extremities: no edema, no cyanosis    The results of significant diagnostics from this hospitalization (including imaging, microbiology, ancillary and laboratory) are listed below for reference.  Microbiology: No results found for this or any previous visit (from the past 240 hour(s)).   Labs: BNP (last 3 results)  Recent Labs  12/02/15 1330 12/07/15 0309  BNP 5.6 36.8   Basic Metabolic Panel:  Recent Labs Lab 08/26/16 1937 08/26/16 1948 08/27/16 0340 08/28/16 0633 08/29/16 0309  NA 137 140 140 139 139  K 3.3* 3.3* 3.5 4.1 3.9  CL 105 104 109 112* 110  CO2 20*  --  25 21* 22  GLUCOSE 212* 213* 85 135* 113*  BUN 20 22* 19 13 9   CREATININE 1.23* 1.10* 0.86 0.80 0.85  CALCIUM 9.6  --  9.1 8.7* 9.2  MG  --   --   --  1.5*  --    Liver Function Tests:  Recent Labs Lab 08/26/16 1937  AST 20  ALT 13*  ALKPHOS 78  BILITOT 0.3  PROT 6.9  ALBUMIN 3.5   No results for input(s): LIPASE, AMYLASE in the last 168 hours. No results for input(s): AMMONIA in the last 168 hours. CBC:  Recent Labs Lab 08/26/16 1937 08/26/16 1948 08/29/16 0309  WBC 7.3  --  6.0  NEUTROABS 3.0  --  2.6  HGB 13.3 13.9 12.6  HCT 40.5 41.0 39.9  MCV 92.7  --  93.7  PLT 234  --  224   Cardiac Enzymes:  Recent Labs Lab 08/28/16 0633 08/28/16 1350  TROPONINI <0.03 <0.03    BNP: Invalid input(s): POCBNP CBG:  Recent Labs Lab 08/28/16 2003 08/29/16 0042 08/29/16 0409 08/29/16 0801 08/29/16 1115  GLUCAP 190* 137* 102* 138* 204*   D-Dimer No results for input(s): DDIMER in the last 72 hours. Hgb A1c  Recent Labs  08/27/16 0340  HGBA1C 8.4*   Lipid Profile  Recent Labs  08/27/16 0340  CHOL 148  HDL 38*  LDLCALC 96  TRIG 72  CHOLHDL 3.9   Thyroid function studies No results for input(s): TSH, T4TOTAL, T3FREE, THYROIDAB in the last 72 hours.  Invalid input(s): FREET3 Anemia work up No results for input(s): VITAMINB12, FOLATE, FERRITIN, TIBC, IRON, RETICCTPCT in the last 72 hours. Urinalysis    Component Value Date/Time   COLORURINE YELLOW 08/26/2016 2015   APPEARANCEUR CLEAR 08/26/2016 2015   LABSPEC <1.005 (L) 08/26/2016 2015   PHURINE 6.5 08/26/2016 2015   GLUCOSEU NEGATIVE 08/26/2016 2015   HGBUR NEGATIVE 08/26/2016 2015   BILIRUBINUR NEGATIVE 08/26/2016 2015   KETONESUR NEGATIVE 08/26/2016 2015   PROTEINUR NEGATIVE 08/26/2016 2015   UROBILINOGEN 1.0 03/07/2015 1720   NITRITE NEGATIVE 08/26/2016 2015   LEUKOCYTESUR NEGATIVE 08/26/2016 2015   Sepsis Labs Invalid input(s): PROCALCITONIN,  WBC,  LACTICIDVEN Microbiology No results found for this or any previous visit (from the past 240 hour(s)).   Time coordinating discharge: Over 30 minutes  SIGNED:  Latrelle DodrillEdwin Silva, MD  [facility] 08/29/2016, 2:02 PM Pager   If 7PM-7AM, please contact night-coverage [email or web address] Password [email or web address]

## 2016-08-29 NOTE — Discharge Instructions (Signed)

## 2016-08-29 NOTE — Progress Notes (Signed)
Received request for 30 day event monitor for patient as she will be going home. I [facility] requesting the event monitor and a follow-up appt thereafter, and our office will call the patient with this information. (Monitors typically have to be pre-certed and are available for pickup shortly after dc.) Ronie Spiesayna Dunn PA-C

## 2016-08-29 NOTE — Care Management Note (Signed)
Case Management Note  Patient Details  Name: [name] [name] MRN: [id number] Date of Birth: 05/06/1946  Subjective/Objective:                    Action/Plan: Patient discharging home with orders for [facility] services. CM met with the patient to provide her a list of [facility] agencies in [place]. She stated she has used [place] in the past and would like to use them again. Mary with [facility] notified and accepted the referral. Pt also with recommendations for walker and shower chair. Pt has walker and wants to get shower chair at outside vendor. Will update the bedside RN.   Expected Discharge Date:                  Expected Discharge Plan:  [facility]  In-House Referral:     Discharge planning Services  CM Consult  Post Acute Care Choice:  Durable Medical Equipment, Home Health Choice offered to:  Patient  DME Arranged:   (pt refused equipment) DME Agency:     HH Arranged:  PT, OT HH Agency:  [facility] (now [facility])  Status of Service:  Completed, signed off  If discussed at [place] of Stay Meetings, dates discussed:    Additional Comments:  Pollie Friar, RN 08/29/2016, 11:49 AM

## 2016-08-29 NOTE — Progress Notes (Signed)
Physical Therapy Treatment Patient Details Name: [name] [name] MRN: [id number] DOB: 1946-09-01 Today's Date: 08/29/2016    History of Present Illness 70 y.o. female with a past medical history significant for NIDDM, HTN, and previous stroke with mild residual L sided weakness who presents with slurred speech. MRI on 10/8 neg for acute intracranial abnormality, did show remote RIGHT insular infarct.    PT Comments    Patient is progressing toward mobility goals however continues to demo L LE weakness, decreased strength and activity tolerance. Continue to progress as tolerated with anticipated d/c home with HHPT.\  Follow Up Recommendations  Home health PT;Supervision/Assistance - 24 hour     Equipment Recommendations  Rolling walker with 5" wheels    Recommendations for Other Services       Precautions / Restrictions Precautions Precautions: Fall Restrictions Weight Bearing Restrictions: No    Mobility  Bed Mobility               General bed mobility comments: Patient in chair  Transfers Overall transfer level: Needs assistance Equipment used: Rolling walker (2 wheeled) Transfers: Sit to/from Stand Sit to Stand: Supervision         General transfer comment: supervision for safety; cues for safe hand placement from recliner and BSC  Ambulation/Gait Ambulation/Gait assistance: Supervision Ambulation Distance (Feet): 200 Feet Assistive device: Rolling walker (2 wheeled) Gait Pattern/deviations: Step-through pattern;Decreased stance time - left;Decreased step length - right;Decreased stride length Gait velocity: decreased   General Gait Details: cues for bilat step length and heel strike; worked on step length symmetry and more even weight shifting; pt with increased L LE weakness with increase in distance; pt with inability to increase gait speed without significant gait disturbances; no LOB but slight L knee buckling noted when returned to room   Stairs             Wheelchair Mobility    Modified Rankin (Stroke Patients Only) Modified Rankin (Stroke Patients Only) Pre-Morbid Rankin Score: No significant disability Modified Rankin: Moderately severe disability     Balance             Standing balance-Leahy Scale: Fair                      Cognition Arousal/Alertness: Awake/alert Behavior During Therapy: WFL for tasks assessed/performed Overall Cognitive Status: Within Functional Limits for tasks assessed                      Exercises General Exercises - Lower Extremity Long Arc Quad: AROM;Both;10 reps;Seated Hip Flexion/Marching: AROM;Both;10 reps;Seated Toe Raises: AROM;Both;20 reps;Seated    General Comments        Pertinent Vitals/Pain Pain Assessment: No/denies pain    Home Living                      Prior Function            PT Goals (current goals can now be found in the care plan section) Acute Rehab PT Goals Patient Stated Goal: return home Progress towards PT goals: Progressing toward goals    Frequency    Min 4X/week      PT Plan Current plan remains appropriate    Co-evaluation             End of Session Equipment Utilized During Treatment: Gait belt Activity Tolerance: Patient tolerated treatment well Patient left: in chair;with call bell/phone within reach;with chair alarm set  Time: 0160-1093 PT Time Calculation (min) (ACUTE ONLY): 26 min  Charges:  $Gait Training: 8-22 mins $Therapeutic Exercise: 8-22 mins                    G Codes:      Derek Mound, PTA Pager: [phone]   08/29/2016, 2:07 PM

## 2016-08-30 ENCOUNTER — Other Ambulatory Visit: Payer: Self-pay | Admitting: Otolaryngology

## 2016-08-30 DIAGNOSIS — J329 Chronic sinusitis, unspecified: Secondary | ICD-10-CM

## 2016-09-07 ENCOUNTER — Ambulatory Visit (INDEPENDENT_AMBULATORY_CARE_PROVIDER_SITE_OTHER): Payer: Medicare Other

## 2016-09-07 ENCOUNTER — Other Ambulatory Visit: Payer: Self-pay | Admitting: Physician Assistant

## 2016-09-07 DIAGNOSIS — I639 Cerebral infarction, unspecified: Secondary | ICD-10-CM | POA: Diagnosis not present

## 2016-09-07 DIAGNOSIS — I4891 Unspecified atrial fibrillation: Secondary | ICD-10-CM

## 2016-09-11 NOTE — Progress Notes (Addendum)
Anesthesia PAT Evaluation: Patient is a 70 year old female scheduled for excision left ear lesion, skin graft split thickness on 09/20/16 by Dr. Suszanne Connerseoh.   History includes former smoker, HTN, DM2, GERD, thoracic ascending aortic aneurysm (4.2 X 4.0 cm by 11/25/15 CTA), CVA with left hemiparesis, chest pain (with normal coronaries 2015), cholecystectomy, hysterectomy, TMJ surgery, obesity.  - 12/07/15-12/09/15 admission for acute left sided weakness, likely TIA. - 08/26/16-08/29/16 admission for transient left sided weakness and slurred speech in the setting of headache, possible TIA. MRI/MRA negative for acute CVA. Neurology consulted. She reported similar episodes dating back to ~ 1984 with negative MRIs. Carotid U/S and echo also negative for TIAI/CVA source. Dr. Marvel PlanJindong Xu thougth patient was possibly having complicated migraines. However, due to finding of old right insular infarct, he did recommend a 30 day cardiac event monitor (which was placed on 09/07/16). ASA 325 mg recommended. 6 week [facility] at [facility] (scheduled 10/25/16).   PCP is Dr. Virl Sonammy Boyd. Cardiologist is Dr. Anne FuSkains. Last cardiology encounter (Dr. Antoine PocheHochrein) was during her 08/26/16 admission due to chest pain. Troponins negative X 3. Echo showed normal LVEF and wall motion. Normal coronaries in 2015. No further ischemic work-up planned. She also reports seeing a [facility]ologist in [facility] for further evaluation of her TAA. She reports a normal stress test and 24 hour Holter monitor in April, records requested.  Meds include albuterol, aspirin 325 mg, glipizide, lisinopril-HCTZ, Toprol-XL, Prilosec, Januvia, Aldactone.  BP (!) 144/86   Pulse (!) 59   Resp 20   Ht 5\' 3"  (1.6 m)   Wt 196 lb 1 oz (88.9 kg)   SpO2 100%   BMI 34.73 kg/m   She denied any new neuro issues since her October discharge. She is currently wearing her 30 day event monitor (due to remove on 10/07/16). She has occasional, non-sustained  palpitations, last about two nights ago. Exam shows a pleasant black female in NAD. Speech is clear. Heart RRR, no murmur noted. Lungs clear. No ankle edema noted. No carotid bruits noted.   08/28/16 EKG: SB at 56 bpm, T wave abnormality, consider anterior ischemia. No significant change since 08/26/16. She has anterior T wave abnormality since at least 01/06/14, although somewhat more prominent on most recent tracings.   08/28/16 Echo: Study Conclusions - Left ventricle: The cavity size was normal. There was mild   concentric hypertrophy. Systolic function was normal. The   estimated ejection fraction was in the range of 60% to 65%. Wall   motion was normal; there were no regional wall motion   abnormalities. Doppler parameters are consistent with abnormal   left ventricular relaxation (grade 1 diastolic dysfunction).   Doppler parameters are consistent with elevated ventricular   end-diastolic filling pressure. - Aortic valve: Trileaflet; normal thickness leaflets. There was no   regurgitation. - Aortic root: The aortic root was normal in size, 38 mm. - Mitral valve: Structurally normal valve. There was mild   regurgitation. - Left atrium: The atrium was normal in size. - Right ventricle: The cavity size was normal. Wall thickness was   normal. Systolic function was normal. - Right atrium: The atrium was normal in size. - Tricuspid valve: There was mild regurgitation. - Pulmonic valve: There was no regurgitation. - Pulmonary arteries: Systolic pressure was mildly increased. PA   peak pressure: 37 mm Hg (S). - Inferior vena cava: The vessel was normal in size. - Pericardium, extracardiac: There was no pericardial effusion.  01/09/14 Cardiac cath (Dr.  Nicki Guadalajara): IMPRESSION: Normal LV function. LVEF at least 55-60% without focal segmental wall motion abnormalities. Normal coronary arteries.  01/08/14 Nuclear stress test: Final Impression: Intermediate risk study. Partially reversible  mid to distal anteroapical defect (SDS 5), which could represent ischemia or less likely shifting breast artifact. Additional fixed inferior and lateral attenuation artifact. LV EF 64% with no wall motion abnormalities. Chest pressure and left arm heaviness was noted with the administration of Lexiscan and subtle anterolateral ST depression and T-wave inversions were noted as well. Clinical correlation is advised. (Cardiac cath recommended; normal coronaries 01/09/14.)  08/27/16 Carotid U/S: Summary: Bilateral: intimal wall thickening CCA. Mild mixed plaque origin ICA. 1-39% ICA plaquing. Vertebral artery flow is antegrade.  12/07/15 CTA chest: IMPRESSION: No CT evidence of aortic dissection. No definite central pulmonary artery embolus. The thoracic aorta is mildly tortuous otherwise unremarkable (on 11/25/15 chest CTA, ascending thoracic aorta measured 4.2 X 4.0 cm). Subcentimeter left thyroid hypodense nodule.   12/07/15 CXR: IMPRESSION: Lungs mildly hypoexpanded. Mild vascular congestion and mild cardiomegaly noted. Mild elevation of the right hemidiaphragm. Increased interstitial markings could reflect minimal interstitial edema or mild pneumonia.  08/27/16 MRI/MRI Head: IMPRESSION: - Chronic changes of atrophy and small vessel disease. No acute intracranial abnormality. - Remote RIGHT insular infarct, but no cause seen for slurred speech. - Unremarkable MRA of the intracranial circulation. - Significant RIGHT maxillary paranasal sinus disease. ENT consultation may be warranted.  Preoperative labs noted. A1c on 08/27/16 was 8.4. She reports fasting CBGs typically in the 130's range.  Above reviewed with anesthesiologist Dr. Marcene Duos. Since she was recently discharged from the hospital for TIA versus complicated migraine then recommend getting neurology input preoperatively--what to inquire if case needs to be postponed for any reason and is it okay to hold ASA if Dr. Suszanne Conners  wishes. I sent a staff message to Dr. Roda Shutters. He wrote, "I do not have any concerns for her to have ear procedure with Dr. Suszanne Conners. Usually we do not need to hold off ASA for low or moderate bleeding risk procedures, but at Dr. Suszanne Conners feels necessary, her aspirin can be temporarily on hold. Resume once procedure done and hemodynamically stable. Avoid peri-operative hypotension." I have relayed this to [facility] at Dr. Avel Sensor office. They will contact patient with perioperative ASA instructions after discussed with Dr. Suszanne Conners. I also forwarded him patient's 12/07/15 chest CTA report in hopes he will let patient know any recommendations for small left thyroid nodule follow-up. I discussed this with patient as well.   I will plan to review any additional records if received from [place].  Velna Ochs [facility] Short Stay Center/Anesthesiology Phone [phone] 09/13/2016 2:31 PM  Addendum: Records received from Dr. Fae Pippin with [facility] in [facility], [place]. It looks like she was referred to that practice by her brother who is also a patient there.  01/18/16 24 hour Holter monitor: Impression: Normal sinus rhythm with few PACs and rare PVCs. Recommendations: No change in therapy.  01/17/16 Nuclear stress test: Impression: Normal SPECT perfusion and normal wall motion study with no evidence for stress-induced hypoperfusion. LVEF 58% with normal global function and normal regional function. The ECG and hemodynamic portion of the test was normal. An exercise test was attempted but the patient stopped abruptly after completing 4:23 of Bruce protocol, therefore Lexiscan study was performed.  Velna Ochs [facility] Short Stay Center/Anesthesiology Phone [phone] 09/14/2016 10:30 AM

## 2016-09-12 NOTE — Pre-Procedure Instructions (Addendum)
[name] [name]  09/12/2016      [facility] [address] [address], [place] - [address] AT [facility] & [address] [place] [address] Phone: [phone] Fax: [phone]    Your procedure is scheduled on November 1  Report to [facility] Admitting at [employer] A.M.  Call this number if you have problems the morning of surgery:  [phone]   Remember:  Do not eat food or drink liquids after midnight.   Take these medicines the morning of surgery with A SIP OF WATER acetaminophen (TYLENOL),  albuterol (PROVENTIL HFA;VENTOLIN,bring with you, metoprolol succinate, omeprazole (PRILOSEC),   7 days prior to surgeryTODAY(09/13/16) STOP taking any Aspirin, Aleve, Naproxen, Ibuprofen, Motrin, Advil, Goody's, BC's, all herbal medications, fish oil, and all vitamins     How to Manage Your Diabetes Before and After Surgery  Why is it important to control my blood sugar before and after surgery? . Improving blood sugar levels before and after surgery helps healing and can limit problems. . A way of improving blood sugar control is eating a healthy diet by: o  Eating less sugar and carbohydrates o  Increasing activity/exercise o  Talking with your doctor about reaching your blood sugar goals . High blood sugars (greater than 180 mg/dL) can raise your risk of infections and slow your recovery, so you will need to focus on controlling your diabetes during the weeks before surgery. . Make sure that the doctor who takes care of your diabetes knows about your planned surgery including the date and location.  How do I manage my blood sugar before surgery? . Check your blood sugar at least 4 times a day, starting 2 days before surgery, to make sure that the level is not too high or low. o Check your blood sugar the morning of your surgery when you wake up and every 2 hours until you get to the Short Stay unit. . If your blood sugar is  less than 70 mg/dL, you will need to treat for low blood sugar: o Do not take insulin. o Treat a low blood sugar (less than 70 mg/dL) with  cup of clear juice (cranberry or apple), 4 glucose tablets, OR glucose gel. o Recheck blood sugar in 15 minutes after treatment (to make sure it is greater than 70 mg/dL). If your blood sugar is not greater than 70 mg/dL on recheck, call [phone] for further instructions. . Report your blood sugar to the short stay nurse when you get to Short Stay.  . If you are admitted to the hospital after surgery: o Your blood sugar will be checked by the staff and you will probably be given insulin after surgery (instead of oral diabetes medicines) to make sure you have good blood sugar levels. o The goal for blood sugar control after surgery is 80-180 mg/dL.           WHAT DO I DO ABOUT MY DIABETES MEDICATION?   Marland Kitchen Do not take oral diabetes medicines (pills) the morning of surgery. ( STIGLIPTIN (JANUVIA) and GLIPIZIDE (GLUCOTROL)) .  glipiZIDE (GLUCOTROL) - only take morning  dose the day before surgery  JANUVIA take as needed day before surgery.DO NOT take day of surgery    Do not wear jewelry, make-up or nail polish.  Do not wear lotions, powders, or perfumes, or deoderant.  Do not shave 48 hours prior to surgery.    Do not  bring valuables to the hospital.  [facility] is not responsible for any belongings or valuables.  Contacts, dentures or bridgework may not be worn into surgery.  Leave your suitcase in the car.  After surgery it may be brought to your room.  For patients admitted to the hospital, discharge time will be determined by your treatment team.  Patients discharged the day of surgery will not be allowed to drive home.    Special instructions:   [facility]- Preparing For Surgery  Before surgery, you can play an important role. Because skin is not sterile, your skin needs to be as free of germs as possible. You can reduce the  number of germs on your skin by washing with CHG (chlorahexidine gluconate) Soap before surgery.  CHG is an antiseptic cleaner which kills germs and bonds with the skin to continue killing germs even after washing.  Please do not use if you have an allergy to CHG or antibacterial soaps. If your skin becomes reddened/irritated stop using the CHG.  Do not shave (including legs and underarms) for at least 48 hours prior to first CHG shower. It is OK to shave your face.  Please follow these instructions carefully.   1. Shower the NIGHT BEFORE SURGERY and the MORNING OF SURGERY with CHG.   2. If you chose to wash your hair, wash your hair first as usual with your normal shampoo.  3. After you shampoo, rinse your hair and body thoroughly to remove the shampoo.  4. Use CHG as you would any other liquid soap. You can apply CHG directly to the skin and wash gently with a scrungie or a clean washcloth.   5. Apply the CHG Soap to your body ONLY FROM THE NECK DOWN.  Do not use on open wounds or open sores. Avoid contact with your eyes, ears, mouth and genitals (private parts). Wash genitals (private parts) with your normal soap.  6. Wash thoroughly, paying special attention to the area where your surgery will be performed.  7. Thoroughly rinse your body with warm water from the neck down.  8. DO NOT shower/wash with your normal soap after using and rinsing off the CHG Soap.  9. Pat yourself dry with a CLEAN TOWEL.   10. Wear CLEAN PAJAMAS   11. Place CLEAN SHEETS on your bed the night of your first shower and DO NOT SLEEP WITH PETS.    Day of Surgery: Do not apply any deodorants/lotions. Please wear clean clothes to the hospital/surgery center.      Please read over the  fact sheets that you were given.

## 2016-09-13 ENCOUNTER — Encounter (HOSPITAL_COMMUNITY)
Admission: RE | Admit: 2016-09-13 | Discharge: 2016-09-13 | Disposition: A | Discharge status: Home / Self Care | Payer: Medicare Other | Source: Ambulatory Visit | Attending: Otolaryngology | Admitting: Otolaryngology

## 2016-09-13 ENCOUNTER — Encounter (HOSPITAL_COMMUNITY): Payer: Self-pay

## 2016-09-13 DIAGNOSIS — E119 Type 2 diabetes mellitus without complications: Secondary | ICD-10-CM | POA: Diagnosis not present

## 2016-09-13 DIAGNOSIS — Z01818 Encounter for other preprocedural examination: Secondary | ICD-10-CM | POA: Diagnosis present

## 2016-09-13 DIAGNOSIS — I10 Essential (primary) hypertension: Secondary | ICD-10-CM | POA: Diagnosis not present

## 2016-09-13 DIAGNOSIS — Z87891 Personal history of nicotine dependence: Secondary | ICD-10-CM | POA: Insufficient documentation

## 2016-09-13 HISTORY — DX: Other complications of anesthesia, initial encounter: T88.59XA

## 2016-09-13 HISTORY — DX: Bronchitis, not specified as acute or chronic: J40

## 2016-09-13 HISTORY — DX: Unspecified osteoarthritis, unspecified site: M19.90

## 2016-09-13 HISTORY — DX: Adverse effect of unspecified anesthetic, initial encounter: T41.45XA

## 2016-09-13 LAB — CBC
HEMATOCRIT: 41.7 % (ref 36.0–46.0)
HEMOGLOBIN: 13.5 g/dL (ref 12.0–15.0)
MCH: 29.9 pg (ref 26.0–34.0)
MCHC: 32.4 g/dL (ref 30.0–36.0)
MCV: 92.3 fL (ref 78.0–100.0)
Platelets: 302 10*3/uL (ref 150–400)
RBC: 4.52 MIL/uL (ref 3.87–5.11)
RDW: 13.8 % (ref 11.5–15.5)
WBC: 6.3 10*3/uL (ref 4.0–10.5)

## 2016-09-13 LAB — BASIC METABOLIC PANEL
ANION GAP: 8 (ref 5–15)
BUN: 12 mg/dL (ref 6–20)
CHLORIDE: 104 mmol/L (ref 101–111)
CO2: 27 mmol/L (ref 22–32)
Calcium: 9.6 mg/dL (ref 8.9–10.3)
Creatinine, Ser: 0.92 mg/dL (ref 0.44–1.00)
GFR calc non Af Amer: >60 mL/min (ref >60)
GLUCOSE: 133 mg/dL — AB (ref 65–99)
POTASSIUM: 3.6 mmol/L (ref 3.5–5.1)
Sodium: 139 mmol/L (ref 135–145)

## 2016-09-13 LAB — GLUCOSE, CAPILLARY: GLUCOSE-CAPILLARY: 133 mg/dL — AB (ref 65–99)

## 2016-09-20 ENCOUNTER — Encounter (HOSPITAL_COMMUNITY): Payer: Self-pay | Admitting: *Deleted

## 2016-09-20 ENCOUNTER — Ambulatory Visit (HOSPITAL_COMMUNITY): Payer: Medicare Other | Admitting: Vascular Surgery

## 2016-09-20 ENCOUNTER — Ambulatory Visit (HOSPITAL_BASED_OUTPATIENT_CLINIC_OR_DEPARTMENT_OTHER)
Admission: RE | Admit: 2016-09-20 | Discharge: 2016-09-20 | Disposition: A | Discharge status: Home / Self Care | Payer: Medicare Other | Source: Ambulatory Visit | Attending: Otolaryngology | Admitting: Otolaryngology

## 2016-09-20 ENCOUNTER — Ambulatory Visit (HOSPITAL_COMMUNITY): Payer: Medicare Other | Admitting: Certified Registered Nurse Anesthetist

## 2016-09-20 ENCOUNTER — Encounter (HOSPITAL_COMMUNITY)
Admission: RE | Disposition: A | Discharge status: Home / Self Care | Payer: Self-pay | Source: Ambulatory Visit | Attending: Otolaryngology

## 2016-09-20 DIAGNOSIS — Z8673 Personal history of transient ischemic attack (TIA), and cerebral infarction without residual deficits: Secondary | ICD-10-CM | POA: Insufficient documentation

## 2016-09-20 DIAGNOSIS — M1990 Unspecified osteoarthritis, unspecified site: Secondary | ICD-10-CM | POA: Insufficient documentation

## 2016-09-20 DIAGNOSIS — H938X2 Other specified disorders of left ear: Secondary | ICD-10-CM | POA: Diagnosis present

## 2016-09-20 DIAGNOSIS — Z7982 Long term (current) use of aspirin: Secondary | ICD-10-CM | POA: Diagnosis not present

## 2016-09-20 DIAGNOSIS — Z7984 Long term (current) use of oral hypoglycemic drugs: Secondary | ICD-10-CM | POA: Diagnosis not present

## 2016-09-20 DIAGNOSIS — E1151 Type 2 diabetes mellitus with diabetic peripheral angiopathy without gangrene: Secondary | ICD-10-CM | POA: Diagnosis not present

## 2016-09-20 DIAGNOSIS — L720 Epidermal cyst: Secondary | ICD-10-CM | POA: Diagnosis not present

## 2016-09-20 DIAGNOSIS — I10 Essential (primary) hypertension: Secondary | ICD-10-CM | POA: Diagnosis not present

## 2016-09-20 DIAGNOSIS — Z888 Allergy status to other drugs, medicaments and biological substances status: Secondary | ICD-10-CM | POA: Diagnosis not present

## 2016-09-20 DIAGNOSIS — K219 Gastro-esophageal reflux disease without esophagitis: Secondary | ICD-10-CM | POA: Insufficient documentation

## 2016-09-20 HISTORY — PX: EAR CYST EXCISION: SHX22

## 2016-09-20 HISTORY — PX: SKIN SPLIT GRAFT: SHX444

## 2016-09-20 LAB — GLUCOSE, CAPILLARY
GLUCOSE-CAPILLARY: 170 mg/dL — AB (ref 65–99)
Glucose-Capillary: 145 mg/dL — ABNORMAL HIGH (ref 65–99)

## 2016-09-20 SURGERY — EXCISION, CYST, EAR
Anesthesia: General | Site: Ear | Laterality: Left

## 2016-09-20 MED ORDER — EPHEDRINE 5 MG/ML INJ
INTRAVENOUS | Status: AC
  Filled 2016-09-20: qty 10

## 2016-09-20 MED ORDER — ONDANSETRON HCL 4 MG/2ML IJ SOLN
INTRAMUSCULAR | Status: DC | PRN
  Administered 2016-09-20: 4 mg via INTRAVENOUS

## 2016-09-20 MED ORDER — PROPOFOL 10 MG/ML IV BOLUS
INTRAVENOUS | Status: DC | PRN
  Administered 2016-09-20: 200 mg via INTRAVENOUS

## 2016-09-20 MED ORDER — PROMETHAZINE HCL 25 MG/ML IJ SOLN
INTRAMUSCULAR | Status: AC
  Administered 2016-09-20: 6.25 mg via INTRAVENOUS
  Filled 2016-09-20: qty 1

## 2016-09-20 MED ORDER — MIDAZOLAM HCL 2 MG/2ML IJ SOLN
INTRAMUSCULAR | Status: AC
  Filled 2016-09-20: qty 2

## 2016-09-20 MED ORDER — LACTATED RINGERS IV SOLN
INTRAVENOUS | Status: DC | PRN
  Administered 2016-09-20: 08:00:00 via INTRAVENOUS

## 2016-09-20 MED ORDER — PROMETHAZINE HCL 25 MG/ML IJ SOLN
6.2500 mg | INTRAMUSCULAR | Status: AC | PRN
  Administered 2016-09-20 (×2): 6.25 mg via INTRAVENOUS

## 2016-09-20 MED ORDER — ONDANSETRON HCL 4 MG/2ML IJ SOLN
INTRAMUSCULAR | Status: AC
  Filled 2016-09-20: qty 4

## 2016-09-20 MED ORDER — ROCURONIUM BROMIDE 10 MG/ML (PF) SYRINGE
PREFILLED_SYRINGE | INTRAVENOUS | Status: AC
  Filled 2016-09-20: qty 10

## 2016-09-20 MED ORDER — EPHEDRINE SULFATE 50 MG/ML IJ SOLN
INTRAMUSCULAR | Status: DC | PRN
  Administered 2016-09-20 (×2): 5 mg via INTRAVENOUS

## 2016-09-20 MED ORDER — FENTANYL CITRATE (PF) 100 MCG/2ML IJ SOLN
INTRAMUSCULAR | Status: DC | PRN
  Administered 2016-09-20: 25 ug via INTRAVENOUS
  Administered 2016-09-20: 50 ug via INTRAVENOUS
  Administered 2016-09-20: 25 ug via INTRAVENOUS

## 2016-09-20 MED ORDER — LIDOCAINE-EPINEPHRINE 1 %-1:100000 IJ SOLN
INTRAMUSCULAR | Status: AC
  Filled 2016-09-20: qty 1

## 2016-09-20 MED ORDER — CIPROFLOXACIN-DEXAMETHASONE 0.3-0.1 % OT SUSP
OTIC | Status: AC
  Filled 2016-09-20: qty 7.5

## 2016-09-20 MED ORDER — FENTANYL CITRATE (PF) 100 MCG/2ML IJ SOLN
INTRAMUSCULAR | Status: AC
  Filled 2016-09-20: qty 2

## 2016-09-20 MED ORDER — PHENYLEPHRINE 40 MCG/ML (10ML) SYRINGE FOR IV PUSH (FOR BLOOD PRESSURE SUPPORT)
PREFILLED_SYRINGE | INTRAVENOUS | Status: AC
  Filled 2016-09-20: qty 10

## 2016-09-20 MED ORDER — PHENYLEPHRINE HCL 10 MG/ML IJ SOLN
INTRAMUSCULAR | Status: DC | PRN
  Administered 2016-09-20 (×2): 80 ug via INTRAVENOUS

## 2016-09-20 MED ORDER — MEPERIDINE HCL 25 MG/ML IJ SOLN: 6.2500 mg | INTRAMUSCULAR | Status: DC | PRN

## 2016-09-20 MED ORDER — HYDROMORPHONE HCL 1 MG/ML IJ SOLN: 0.2500 mg | INTRAMUSCULAR | Status: DC | PRN

## 2016-09-20 MED ORDER — 0.9 % SODIUM CHLORIDE (POUR BTL) OPTIME
TOPICAL | Status: DC | PRN
  Administered 2016-09-20: 1000 mL

## 2016-09-20 MED ORDER — LACTATED RINGERS IV SOLN: INTRAVENOUS | Status: DC

## 2016-09-20 MED ORDER — LIDOCAINE-EPINEPHRINE 1 %-1:100000 IJ SOLN
INTRAMUSCULAR | Status: DC | PRN
  Administered 2016-09-20: 7 mL via INTRADERMAL

## 2016-09-20 MED ORDER — PROPOFOL 10 MG/ML IV BOLUS
INTRAVENOUS | Status: AC
  Filled 2016-09-20: qty 20

## 2016-09-20 MED ORDER — AMOXICILLIN 875 MG PO TABS: 875.0000 mg | ORAL_TABLET | Freq: Two times a day (BID) | ORAL | 0 refills | Status: AC

## 2016-09-20 MED ORDER — LIDOCAINE HCL (CARDIAC) 20 MG/ML IV SOLN
INTRAVENOUS | Status: DC | PRN
  Administered 2016-09-20: 50 mg via INTRAVENOUS

## 2016-09-20 SURGICAL SUPPLY — 44 items
ADH SKN CLS APL DERMABOND .7 (GAUZE/BANDAGES/DRESSINGS) ×2
BALL CTTN LRG ABS STRL LF (GAUZE/BANDAGES/DRESSINGS) ×2
BLADE SURG 15 STRL LF DISP TIS (BLADE) IMPLANT
BLADE SURG 15 STRL SS (BLADE) ×4
CANISTER SUCTION 2500CC (MISCELLANEOUS) ×4 IMPLANT
CONT SPEC 4OZ CLIKSEAL STRL BL (MISCELLANEOUS) ×2 IMPLANT
COTTONBALL LRG STERILE PKG (GAUZE/BANDAGES/DRESSINGS) ×2 IMPLANT
COVER SURGICAL LIGHT HANDLE (MISCELLANEOUS) ×4 IMPLANT
DERMABOND ADVANCED (GAUZE/BANDAGES/DRESSINGS) ×2
DERMABOND ADVANCED .7 DNX12 (GAUZE/BANDAGES/DRESSINGS) IMPLANT
DRAPE HALF SHEET 40X57 (DRAPES) ×6 IMPLANT
DRAPE PROXIMA HALF (DRAPES) ×4 IMPLANT
ELECT COATED BLADE 2.86 ST (ELECTRODE) ×4 IMPLANT
ELECT REM PT RETURN 9FT ADLT (ELECTROSURGICAL) ×4
ELECTRODE REM PT RTRN 9FT ADLT (ELECTROSURGICAL) IMPLANT
GAUZE SPONGE 4X4 16PLY XRAY LF (GAUZE/BANDAGES/DRESSINGS) ×2 IMPLANT
GAUZE XEROFORM 5X9 LF (GAUZE/BANDAGES/DRESSINGS) ×2 IMPLANT
GLOVE BIO SURGEON STRL SZ 6 (GLOVE) ×4 IMPLANT
GLOVE BIOGEL PI IND STRL 7.0 (GLOVE) IMPLANT
GLOVE BIOGEL PI INDICATOR 7.0 (GLOVE) ×2
GLOVE ECLIPSE 7.0 STRL STRAW (GLOVE) ×4 IMPLANT
GLOVE ECLIPSE 7.5 STRL STRAW (GLOVE) ×4 IMPLANT
GOWN STRL REUS W/ TWL LRG LVL3 (GOWN DISPOSABLE) ×4 IMPLANT
GOWN STRL REUS W/TWL LRG LVL3 (GOWN DISPOSABLE) ×8
KIT BASIN OR (CUSTOM PROCEDURE TRAY) ×4 IMPLANT
KIT ROOM TURNOVER OR (KITS) ×4 IMPLANT
MARKER SKIN DUAL TIP RULER LAB (MISCELLANEOUS) ×4 IMPLANT
NDL HYPO 25GX1X1/2 BEV (NEEDLE) IMPLANT
NEEDLE HYPO 25GX1X1/2 BEV (NEEDLE) ×4 IMPLANT
NS IRRIG 1000ML POUR BTL (IV SOLUTION) ×4 IMPLANT
PACK GENERAL/GYN (CUSTOM PROCEDURE TRAY) ×4 IMPLANT
PAD ARMBOARD 7.5X6 YLW CONV (MISCELLANEOUS) ×8 IMPLANT
STAPLER VISISTAT 35W (STAPLE) ×2 IMPLANT
STOCKINETTE IMPERVIOUS LG (DRAPES) ×4 IMPLANT
SUT PLAIN 4 0 ~~LOC~~ 1 (SUTURE) ×2 IMPLANT
SUT PLAIN 5 0 P 3 18 (SUTURE) ×2 IMPLANT
SUT VIC AB 3-0 FS2 27 (SUTURE) ×2 IMPLANT
SYR CONTROL 10ML LL (SYRINGE) ×2 IMPLANT
TOWEL OR 17X24 6PK STRL BLUE (TOWEL DISPOSABLE) ×4 IMPLANT
TOWEL OR 17X26 10 PK STRL BLUE (TOWEL DISPOSABLE) ×4 IMPLANT
TUBE CONNECTING 12'X1/4 (SUCTIONS) ×1
TUBE CONNECTING 12X1/4 (SUCTIONS) ×3 IMPLANT
TUBING EXTENTION W/L.L. (IV SETS) ×4 IMPLANT
UNDERPAD 30X30 (UNDERPADS AND DIAPERS) ×4 IMPLANT

## 2016-09-20 NOTE — Op Note (Signed)
DATE OF PROCEDURE:  09/20/2016                              OPERATIVE REPORT  SURGEON:  Newman PiesSu Patriece Archbold, MD  PREOPERATIVE DIAGNOSES: 1. Left ear canal mass  POSTOPERATIVE DIAGNOSES: 1. Left ear canal mass  PROCEDURE PERFORMED:   1. Excision of left ear canal mass 2. Full thickness skin graft (4 sq cm) from abdomen  ANESTHESIA:  General endotracheal tube anesthesia.  COMPLICATIONS:  None.  ESTIMATED BLOOD LOSS:  Minimal.  INDICATION FOR PROCEDURE:  [name] is a 70 y.o. female with a history of an enlarging pigmented mass at the entrance to the left ear canal. The patient first noted the lesion 5 years ago. The size of the lesion has gradually enlarged. Currently it is approximately 1 cm in diameter. Based on the above findings, the decision was made for the patient to undergo the surgical excision procedure. Due to the size and location of the lesion, the surgical defect needed to be covered by a full-thickness skin graft. The risks, benefits, alternatives, and details of the procedures were discussed with the patient.  Questions were invited and answered.  Informed consent was obtained.  DESCRIPTION:  The patient was taken to the operating room and placed supine on the operating table.  General endotracheal tube anesthesia was administered by the anesthesiologist.  The patient was positioned and prepped and draped in a standard fashion for left ear surgery. 1% lidocaine with 1-100,000 epinephrine was infiltrated locally around the left ear canal lesion. Local anesthesia was also used to infiltrate the region around the left lower quadrant skin graft site.   A circular incision was then made around the left ear canal lesion. The incision was made to achieve 3 mm margins around the pigmented lesion. The incision was carried down to the level of the perichondrium. The entire lesion and the perichondrium were removed. The specimen was sent to the pathology department for frozen section analysis.  The frozen section result was benign. The surgical site was copiously irrigated.  Attention was then focused on obtaining the skin graft. A 3 x 7 elliptical incision was made on the left lower quadrant abdominal skin. The entire full-thickness skin graft was then harvested in the standard fashion. Extensive undermining was performed to the surrounding skin. The incision was closed with interrupted Vicryl sutures and Dermabond.  The harvested full-thickness skin graft was then used to cover the surgical defect. The skin graft was sutured in place with 4-0 plain gut sutures. A Xeroform bolster was then placed into the ear canal and sutured in place.  The care of the patient was turned over to the anesthesiologist.  The patient was awakened from anesthesia without difficulty.  The patient was extubated and transferred to the recovery room in good condition.  OPERATIVE FINDINGS:  The pigmented left ear canal lesion was excised without difficulty. The frozen section result was benign.  SPECIMEN:  Left ear canal pigmented lesion.  FOLLOWUP CARE:  The patient will be discharged home once awake and alert.  She will be placed on amoxicillin 875 mg p.o. b.i.d. for 5 days. The patient will follow up in my office in approximately 1 week.  Aniyia Rane,SUI W 09/20/2016 9:54 AM

## 2016-09-20 NOTE — H&P (Signed)
Cc: Growth in left ear  HPI: The patient is a 70 y/o female who presents today for evaluation of a left ear lesion. The patient first noticed the area 5+ years ago. The lesion has slowly gotten larger with increased crusting. She denies any pain or drainage. The growth does not affect her hearing. No previous ENT surgery is noted.   The patient's review of systems (constitutional, eyes, ENT, cardiovascular, respiratory, GI, musculoskeletal, skin, neurologic, psychiatric, endocrine, hematologic, allergic) is noted in the ROS questionnaire.  It is reviewed with the patient.   Family health history: Heart disease, diabetes, pulmonary embolism.   Major events: Gallbladder removed, heart attack.   Ongoing medical problems: Hypertension, chest pain, chronic bronchitis, migraine stroke.   Social history: The patient is a widow. She denies the use of tobacco, alcohol or illegal drugs.  Exam: General: Communicates without difficulty, well nourished, no acute distress. Head: Normocephalic, no evidence injury, no tenderness, facial buttresses intact without stepoff. Eyes: PERRL, EOMI. No scleral icterus, conjunctivae clear. Neuro: CN II exam reveals vision grossly intact.  No nystagmus at any point of gaze. Ears: Auricles well formed without lesions.  Ear canals are intact with a 1 cm lesion noted on the left.  No erythema or edema is appreciated.  The TMs are intact without fluid. Nose: External evaluation reveals normal support and skin without lesions.  Dorsum is intact.  Anterior rhinoscopy reveals healthy pink mucosa over anterior aspect of inferior turbinates and intact septum.  No purulence noted. Oral:  Oral cavity and oropharynx are intact, symmetric, without erythema or edema.  Mucosa is moist without lesions. Neck: Full range of motion without pain.  There is no significant lymphadenopathy.  No masses palpable.  Thyroid bed within normal limits to palpation.  Parotid glands and submandibular glands  equal bilaterally without mass.  Trachea is midline. Neuro:  CN 2-12 grossly intact. Gait normal. Vestibular: No nystagmus at any point of gaze. The cerebellar examination is unremarkable.   Assessment 1.  A 1 cm left ear canal lesion is noted with central crusting.   Plan  1.  The physical exam findings are reviewed with the patient. 2.  Recommend excision of the lesion. Due to the size and location of the lesion, the patient will likely require a skin graft. The risks, benefits, alternatives, and details of the procedure are reviewed with the patient. Questions are invited and answered. 3.  The patient is interested in proceeding with the procedure.  We will schedule the procedure in accordance with the family schedule.

## 2016-09-20 NOTE — Anesthesia Preprocedure Evaluation (Addendum)
Anesthesia Evaluation   Patient awake    Reviewed: Allergy & Precautions, NPO status , Patient's Chart, lab work & pertinent test results, reviewed documented beta blocker date and time   Airway Mallampati: II  TM Distance: >3 FB Neck ROM: Full    Dental  (+) Teeth Intact, Dental Advisory Given   Pulmonary    breath sounds clear to auscultation       Cardiovascular hypertension, Pt. on medications and Pt. on home beta blockers + Peripheral Vascular Disease   Rhythm:Regular Rate:Normal     Neuro/Psych  Headaches, TIA Neuromuscular disease CVA    GI/Hepatic Neg liver ROS, GERD  Medicated,  Endo/Other  diabetes, Type 2  Renal/GU negative Renal ROS  negative genitourinary   Musculoskeletal  (+) Arthritis , Osteoarthritis,    Abdominal   Peds negative pediatric ROS (+)  Hematology negative hematology ROS (+)   Anesthesia Other Findings   Reproductive/Obstetrics negative OB ROS                            Lab Results  Component Value Date   WBC 6.3 09/13/2016   HGB 13.5 09/13/2016   HCT 41.7 09/13/2016   MCV 92.3 09/13/2016   PLT 302 09/13/2016   Lab Results  Component Value Date   CREATININE 0.92 09/13/2016   BUN 12 09/13/2016   NA 139 09/13/2016   K 3.6 09/13/2016   CL 104 09/13/2016   CO2 27 09/13/2016   Lab Results  Component Value Date   INR 1.01 08/26/2016   INR 1.06 12/07/2015   INR 0.95 03/07/2015   08/2016 EKG: sinus bradycardia.  11/2015 Echo - Left ventricle: The cavity size was normal. Wall thickness was   increased in a pattern of severe LVH. Systolic function was   normal. The estimated ejection fraction was in the range of 60%   to 65%. Wall motion was normal; there were no regional wall   motion abnormalities. Doppler parameters are consistent with   abnormal left ventricular relaxation (grade 1 diastolic   dysfunction). - Atrial septum: No defect or patent  foramen ovale was identified.   Anesthesia Physical Anesthesia Plan  ASA: III  Anesthesia Plan: General   Post-op Pain Management:    Induction: Intravenous  Airway Management Planned: LMA  Additional Equipment:   Intra-op Plan:   Post-operative Plan: Extubation in OR  Informed Consent: I have reviewed the patients History and Physical, chart, labs and discussed the procedure including the risks, benefits and alternatives for the proposed anesthesia with the patient or authorized representative who has indicated his/her understanding and acceptance.   Dental advisory given  Plan Discussed with: CRNA  Anesthesia Plan Comments:        Anesthesia Quick Evaluation

## 2016-09-20 NOTE — Transfer of Care (Signed)
Immediate Anesthesia Transfer of Care Note  Patient: [name] [name]  Procedure(s) Performed: Procedure(s): EXCISION  LEFT EAR LESION (Left) SKIN GRAFT SPLIT THICKNESS (Left)  Patient Location: PACU  Anesthesia Type:General  Level of Consciousness: awake, alert , oriented and patient cooperative  Airway & Oxygen Therapy: Patient Spontanous Breathing and Patient connected to face mask oxygen  Post-op Assessment: Report given to RN and Post -op Vital signs reviewed and stable  Post vital signs: Reviewed and stable  Last Vitals:  Vitals:   09/20/16 0706  BP: (!) 161/78  Pulse: (!) 55  Resp: 20  Temp: 36.5 C    Last Pain: There were no vitals filed for this visit.       Complications: No apparent anesthesia complications

## 2016-09-20 NOTE — Anesthesia Procedure Notes (Signed)
Procedure Name: LMA Insertion Date/Time: 09/20/2016 8:38 AM Performed by: Adonis HousekeeperNGELL, Jarelle Ates M Pre-anesthesia Checklist: Patient identified, Emergency Drugs available, Suction available and Patient being monitored Patient Re-evaluated:Patient Re-evaluated prior to inductionOxygen Delivery Method: Circle system utilized Preoxygenation: Pre-oxygenation with 100% oxygen Intubation Type: IV induction Ventilation: Mask ventilation without difficulty LMA: LMA inserted LMA Size: 4.0 Number of attempts: 1 Placement Confirmation: positive ETCO2 and breath sounds checked- equal and bilateral Tube secured with: Tape Dental Injury: Teeth and Oropharynx as per pre-operative assessment

## 2016-09-20 NOTE — Discharge Instructions (Signed)
The patient may resume all her previous activities and diet. The patient will follow up in my office in one week.

## 2016-09-20 NOTE — Anesthesia Postprocedure Evaluation (Signed)
Anesthesia Post Note  Patient: [name] [name]  Procedure(s) Performed: Procedure(s) (LRB): EXCISION  LEFT EAR LESION (Left) SKIN GRAFT SPLIT THICKNESS (Left)  Patient location during evaluation: PACU Anesthesia Type: General Level of consciousness: awake and alert Pain management: pain level controlled Vital Signs Assessment: post-procedure vital signs reviewed and stable Respiratory status: spontaneous breathing, nonlabored ventilation, respiratory function stable and patient connected to nasal cannula oxygen Cardiovascular status: blood pressure returned to baseline and stable Postop Assessment: no signs of nausea or vomiting Anesthetic complications: no    Last Vitals:  Vitals:   09/20/16 1130 09/20/16 1200  BP: 132/75 (!) 152/73  Pulse: (!) 55 66  Resp: 13   Temp: 36.7 C     Last Pain:  Vitals:   09/20/16 1200  PainSc: 0-No pain                 Shelton SilvasKevin D Samanthajo Payano

## 2016-09-21 ENCOUNTER — Encounter (HOSPITAL_COMMUNITY): Payer: Self-pay | Admitting: Otolaryngology

## 2016-10-11 ENCOUNTER — Telehealth: Payer: Self-pay | Admitting: *Deleted

## 2016-10-11 NOTE — Telephone Encounter (Signed)
Patient informed. 

## 2016-10-11 NOTE — Telephone Encounter (Signed)
-----   Message from Jake BatheMark C Skains, MD sent at 10/11/2016  3:37 PM EST -----  No atrial fibrillation  No pauses  Average heart rate 62 bpm   Reassuring monitor Donato SchultzMark Skains, MD

## 2016-10-14 IMAGING — MR MR HEAD W/O CM
9 of 12 series · 29 of 48 positions shown · IV contrast (Yes   MH)
Comparison: Head CT without contrast 6878 hours today. Brain MRI
and MRA 07/01/2012.

CLINICAL DATA: 69-year-old female with previous stroke resulting in
left side weakness. Complains of increased left side weakness, chest
pain, shortness of breath. Initial encounter.

EXAM:
MRI HEAD WITHOUT CONTRAST
MRA HEAD WITHOUT CONTRAST
TECHNIQUE: Multiplanar, multiecho pulse sequences of the brain and surrounding
structures were obtained without intravenous contrast. Angiographic
images of the head were obtained using MRA technique without
contrast.

[Series 2: FLAIR · sagittal · 5.0mm · 0.47mm/px · 1 of 23 slices shown (1 of 2)]
[im 1/23]
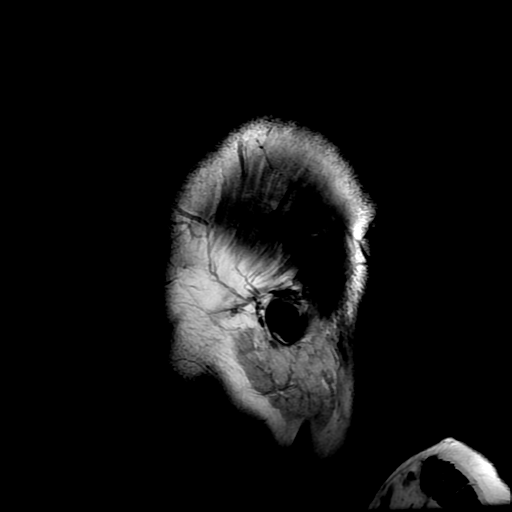

[Series 4: FLAIR · axial · 5.0mm · 0.47mm/px · z∈[-86,+45]mm · 2 of 24 slices shown (2 of 2)]
[im 1/24]
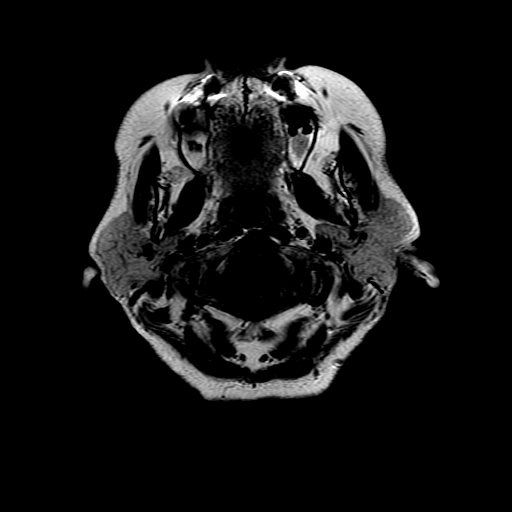
[im 24/24]
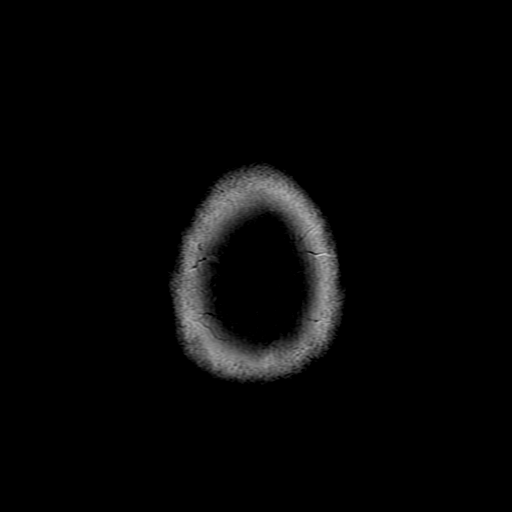

[Series 5: (person_name) · axial · 3.0mm · 0.47mm/px · z∈[-76,+26]mm · 6 of 100 slices shown]
[im 1/100]
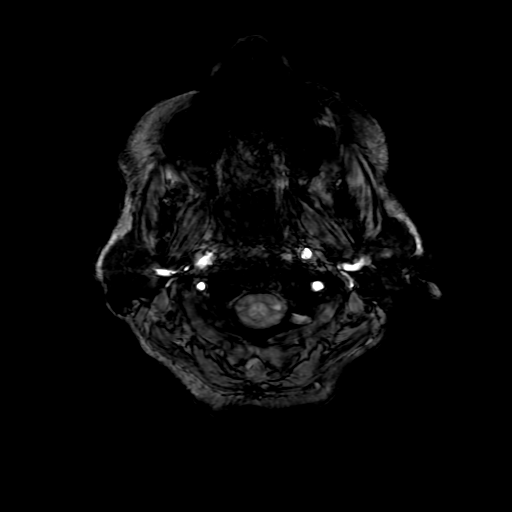
[im 15/100]
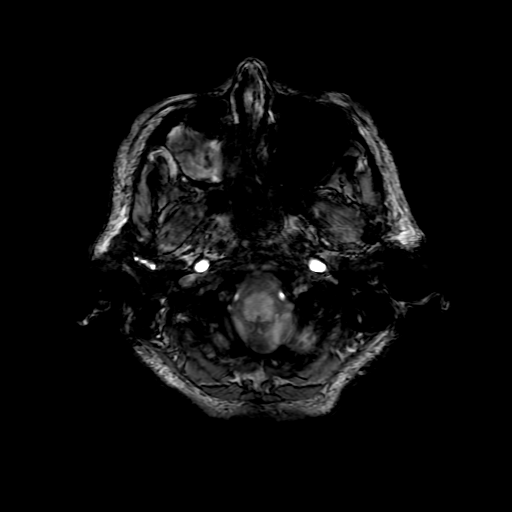
[im 29/100]
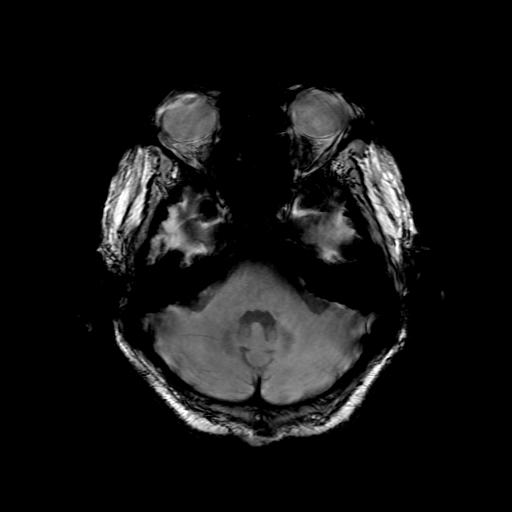
[im 43/100]
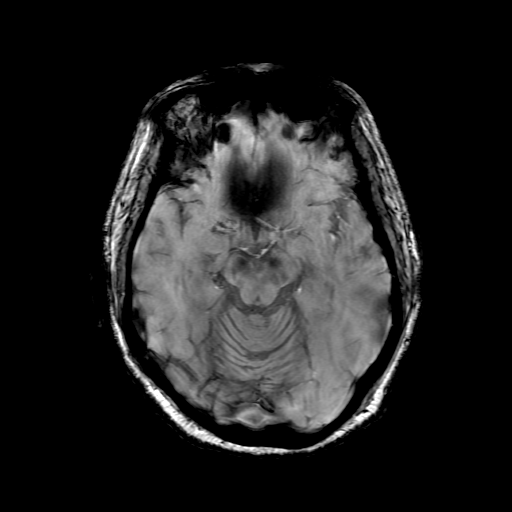
[im 57/100]
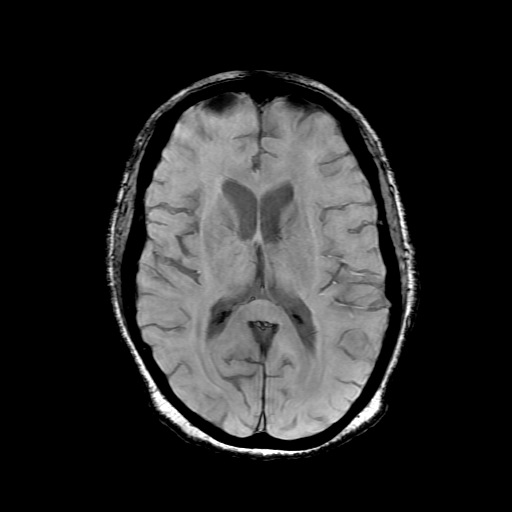
[im 71/100]
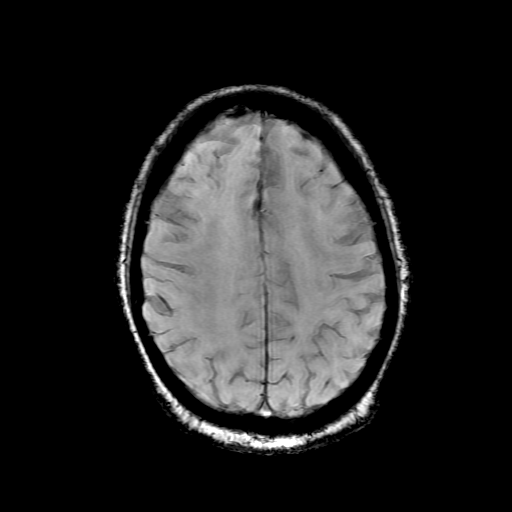

[Series 6: DWI · axial · 3.6mm · 0.94mm/px · z∈[-87,+41]mm · 6 of 78 slices shown (1 of 4)]
[im 1/78]
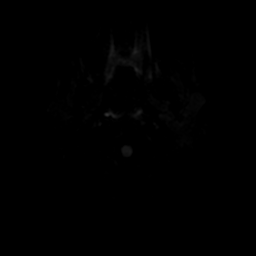
[im 16/78]
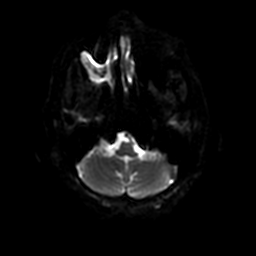
[im 31/78]
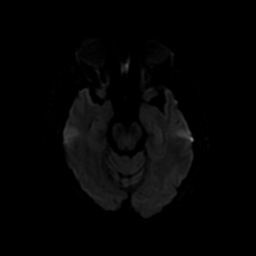
[im 47/78]
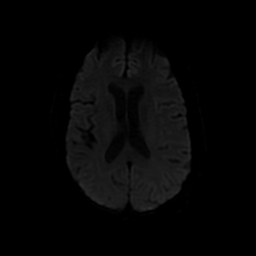
[im 62/78]
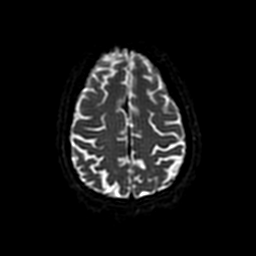
[im 78/78]
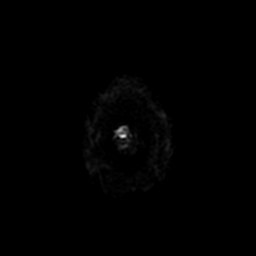

[Series 7: T2 · axial · 5.0mm · 0.47mm/px · z∈[-86,+45]mm · 2 of 24 slices shown]
[im 1/24]
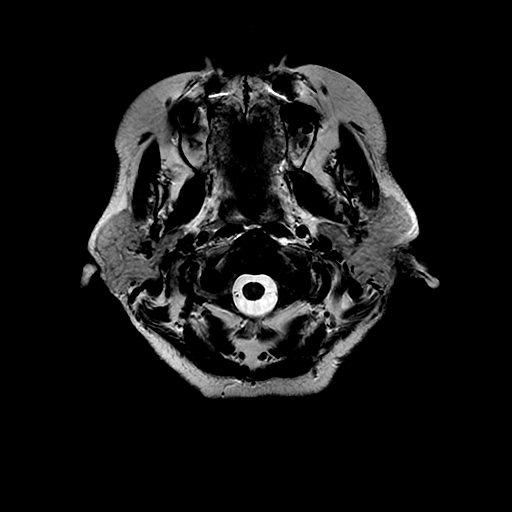
[im 24/24]
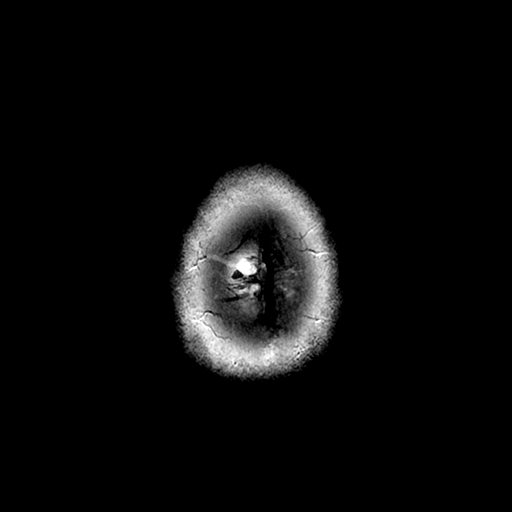

[Series 8: DWI · coronal · 5.0mm · 0.94mm/px · 5 of 64 slices shown (2 of 4)]
[im 1/64]
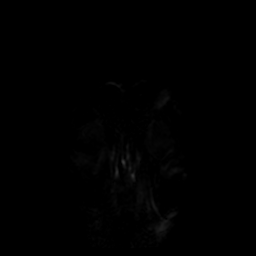
[im 16/64]
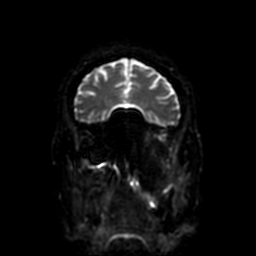
[im 32/64]
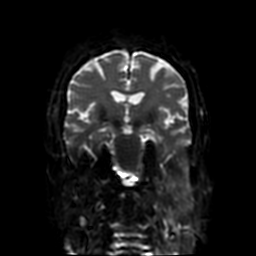
[im 48/64]
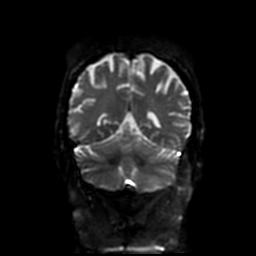
[im 64/64]
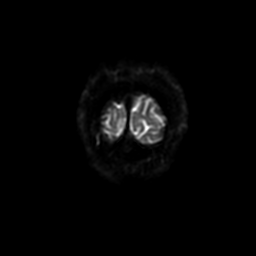

[Series 10: T2 post-contrast · coronal · 5.0mm · 0.39mm/px · 2 of 27 slices shown]
[im 1/27]
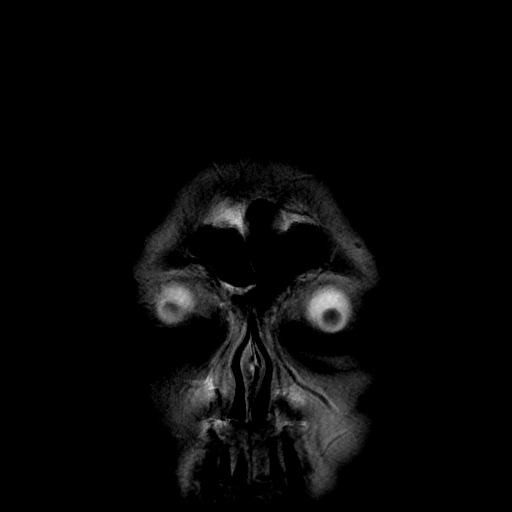
[im 27/27]
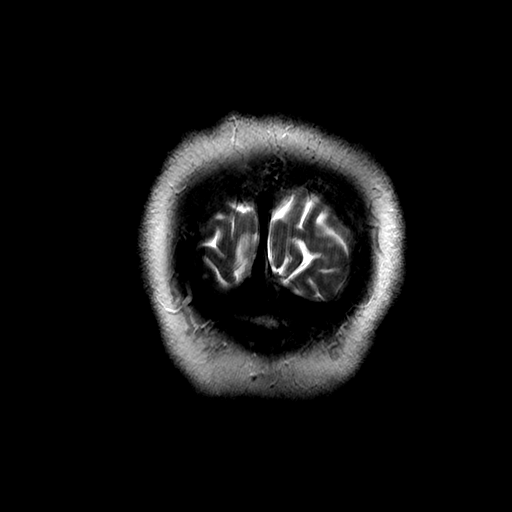

[Series 600: DWI · axial · 3.6mm · 0.94mm/px · z∈[-87,+41]mm · 3 of 39 slices shown (3 of 4)]
[im 1/39]
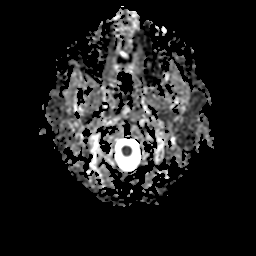
[im 20/39]
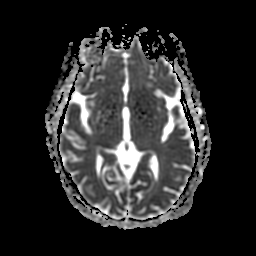
[im 39/39]
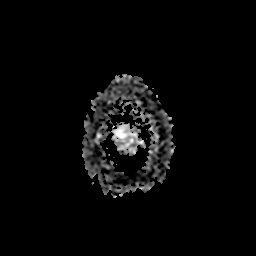

[Series 800: DWI · coronal · 5.0mm · 0.94mm/px · 2 of 32 slices shown (4 of 4)]
[im 1/32]
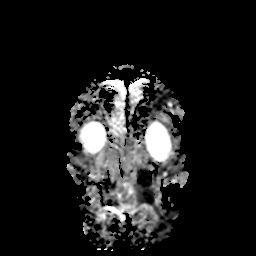
[im 32/32]
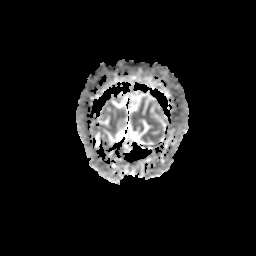

[29 of 48 positions shown; findings below may reference images not displayed]

FINDINGS: MRI HEAD FINDINGS

Major intracranial vascular flow voids are stable. No restricted
diffusion to suggest acute infarction. No midline shift, mass
effect, evidence of mass lesion, ventriculomegaly, extra-axial
collection or acute intracranial hemorrhage. Cervicomedullary
junction and pituitary are within normal limits. Negative visualized
cervical spine.

Cerebral volume is not significantly changed since 5276. Mild for
age scattered and small foci of nonspecific cerebral white matter T2
and FLAIR hyperintensity have not significantly changed. No cortical
encephalomalacia. There is a small cluster of chronic micro
hemorrhage in the anterior right corona radiata (series 5, image 63)
which is more apparent today owing to susceptibility weighted
imaging. No other chronic cerebral blood products. Deep gray matter
nuclei and brainstem are within normal limits. There is a tiny
chronic lacune in the right cerebellum which is new (series 7, image
8). The cerebellum otherwise appears normal. Visible internal
auditory structures appear normal.

Mastoids are clear. Chronic right maxillary sinusitis with
superimposed fluid level today. Mild ethmoid sinus mucosal disease
is stable. Small volume retained secretions in the nasopharynx.
Orbit and scalp soft tissues are within normal limits. Normal bone
marrow signal.

MRA HEAD FINDINGS

Stable antegrade flow in the posterior circulation with mildly
dominant distal left vertebral artery. PICA origins remain patent.
Basilar artery remains patent without stenosis. Mild ectasia at the
basilar artery tip appears stable. PCA origins remain normal.
Posterior communicating arteries are diminutive or absent. Bilateral
PCA branches are stable and within normal limits.

Stable antegrade flow in both ICA siphons. Mild siphon irregularity
in keeping with atherosclerosis, exacerbated by motion artifact on
today study. No siphon stenosis identified. Stable carotid termini.
Dominant left ACA A1 segment re- demonstrated with diminutive or
absent right A1. Left ACA and MCA origins are normal. Anterior
communicating artery and visualized ACA branches are stable, the
left ACA remains dominant. MCA M1 segments and bifurcations are
stable and within normal limits. Visualized MCA branches are stable
and within normal limits.
IMPRESSION: 1.  No acute intracranial abnormality.
2. Mild for age chronic small vessel disease, not significantly
changed since [DATE]. Stable and negative for age intracranial MRA.
4. Acute on chronic right maxillary sinusitis.

## 2016-10-25 ENCOUNTER — Ambulatory Visit: Payer: Self-pay | Admitting: Nurse Practitioner

## 2016-10-30 ENCOUNTER — Encounter: Payer: Self-pay | Admitting: Nurse Practitioner

## 2016-10-30 ENCOUNTER — Ambulatory Visit (INDEPENDENT_AMBULATORY_CARE_PROVIDER_SITE_OTHER): Payer: Medicare Other | Admitting: Nurse Practitioner

## 2016-10-30 VITALS — BP 123/84 | HR 75 | Ht 63.0 in | Wt 194.0 lb

## 2016-10-30 DIAGNOSIS — E119 Type 2 diabetes mellitus without complications: Secondary | ICD-10-CM | POA: Diagnosis not present

## 2016-10-30 DIAGNOSIS — G43109 Migraine with aura, not intractable, without status migrainosus: Secondary | ICD-10-CM | POA: Diagnosis not present

## 2016-10-30 DIAGNOSIS — I639 Cerebral infarction, unspecified: Secondary | ICD-10-CM

## 2016-10-30 DIAGNOSIS — J329 Chronic sinusitis, unspecified: Secondary | ICD-10-CM | POA: Diagnosis not present

## 2016-10-30 DIAGNOSIS — R400 Somnolence: Secondary | ICD-10-CM | POA: Diagnosis not present

## 2016-10-30 DIAGNOSIS — I10 Essential (primary) hypertension: Secondary | ICD-10-CM

## 2016-10-30 NOTE — Progress Notes (Signed)
I reviewed above note and agree with the assessment and plan.  Marvel PlanJindong Arlin Sass, MD PhD Stroke Neurology 10/30/2016 5:16 PM

## 2016-10-30 NOTE — Patient Instructions (Addendum)
Stressed the importance of management of risk factors to prevent further stroke Continue Aspirin for secondary stroke prevention Maintain strict control of hypertension with blood pressure goal below 130/90, today's reading123/84  continue antihypertensive medications Control of diabetes with hemoglobin A1c below 6.5 followed by primary care most recent hemoglobin A1c 8.4 on 08/27/16  continue diabetic medications Cholesterol with LDL cholesterol less than 70, followed by primary care,  most recent 96 continue statin drugs Lipitor Exercise by walking, slowly increase , eat healthy diet with whole grains,  fresh fruits and vegetables Sleep study Follow up in 3 months

## 2016-10-30 NOTE — Progress Notes (Signed)
[facility]  PATIENT: [name] [name] DOB: 01-Sep-1946   REASON FOR VISIT: Hospital follow-up for left-sided weakness HISTORY FROM: Patient    HISTORY OF PRESENT ILLNESS:[name] [name] an 70 y.o.femalewith a history of diabetes mellitus, hypertension and hyperlipidemia as well as TIA in January 2017, presenting with recurrent acute left-sided weakness. Onset was at 1:00 on 08/26/16.  She's also complaining of right-sided headache. She's noted changes in her speech as well. CT scan of the head showed no acute intracranial abnormality. Workup in January, including MRI of the brain did not show acute stroke. She's been taking aspirin daily. NIH stroke score was 12. Chronic changes of atrophy and small vessel disease. No acute intracranial abnormality. Remote RIGHT insular infarct, but no cause seen for slurred speech. Unremarkable MRA of the intracranial circulation. Significant RIGHT maxillary paranasal sinus disease. Carotid ultrasound 1-39% ICA stenosis. 2-D echo 60-65% EF. LDL 96 hemoglobin A1c 8.4. She returns to the clinic for hospital follow-up. 30 day event monitoring no atrial fibrillation no pulses average heart rate 62. She remains on aspirin for secondary stroke prevention. Blood pressure in the office today 123/84. She remains on Lipitor with no complaints of muscle aches. She gets no regular exercise. She also complaints with snoring at night and daytime drowsiness she has never had a sleep study. She returns for reevaluation   REVIEW OF SYSTEMS: Full 14 system review of systems performed and notable only for those listed, all others are neg:  Constitutional: neg  Cardiovascular: neg Ear/Nose/Throat: neg  Skin: neg Eyes: neg Respiratory: Cough Gastroitestinal: neg  Hematology/Lymphatic: neg  Endocrine: neg Musculoskeletal:neg Allergy/Immunology: neg Neurological: Occasional headache, weakness Psychiatric: neg Sleep : Daytime drowsiness,  snoring ALLERGIES: Allergies  Allergen Reactions  . Metformin Diarrhea  . Adhesive [Tape] Rash    pls use elastic wraps instead of adhesive tape  . Codeine Nausea And Vomiting    HOME MEDICATIONS: Outpatient Medications Prior to Visit  Medication Sig Dispense Refill  . acetaminophen (TYLENOL) 325 MG tablet Take 2 tablets (650 mg total) by mouth every 4 (four) hours as needed for mild pain (or temp >/= 99.5 F). 20 tablet 0  . albuterol (PROVENTIL HFA;VENTOLIN HFA) 108 (90 BASE) MCG/ACT inhaler Inhale 1-2 puffs into the lungs every 6 (six) hours as needed for wheezing. 1 Inhaler 0  . aspirin 325 MG tablet Take 1 tablet (325 mg total) by mouth daily. 30 tablet 4  . atorvastatin (LIPITOR) 10 MG tablet Take 1 tablet (10 mg total) by mouth daily at 6 PM. 30 tablet 3  . Cholecalciferol (VITAMIN D3) 1000 units CAPS Take 1 capsule by mouth daily.    Marland Kitchen. glipiZIDE (GLUCOTROL) 5 MG tablet Take 5-10 mg by mouth 2 (two) times daily before a meal. 10 mg in the morning and 5 mg in the evening    . lisinopril-hydrochlorothiazide (PRINZIDE,ZESTORETIC) 10-12.5 MG tablet TAKE 1 TABLET BY MOUTH DAILY 90 tablet 0  . metoprolol succinate (TOPROL-XL) 50 MG 24 hr tablet TAKE 1 TABLET BY MOUTH DAILY( WITH OR IMMEDIATELY FOLLOWING A MEAL) 90 tablet 2  . omeprazole (PRILOSEC) 40 MG capsule Take 40 mg by mouth daily.    . ONE TOUCH ULTRA TEST test strip 1 each by Other route as needed (BLOOD GLUCOSE MONITORING).   0  . potassium chloride SA (K-DUR,KLOR-CON) 20 MEQ tablet Take 1 tablet (20 mEq total) by mouth daily. 30 tablet 11  . sitaGLIPtin (JANUVIA) 100 MG tablet Take 100 mg by mouth as needed (only takes  for elevated blood sugars over 140). Has at home but has not taken yet because hasn't had any elevated blood sugars since gotten script.    Marland Kitchen spironolactone (ALDACTONE) 25 MG tablet Take 12.5 mg by mouth daily.    Marland Kitchen acetaminophen (TYLENOL) 500 MG chewable tablet Chew 500 mg by mouth every 6 (six) hours as needed for  pain.     No facility-administered medications prior to visit.     PAST MEDICAL HISTORY: Past Medical History:  Diagnosis Date  . Arthritis   . Bronchitis    hx  . Chest pain 2009   MV with no scar or ischemia, EF 65%  . Complication of anesthesia    claustrophobic !  . Diabetes mellitus   . GERD (gastroesophageal reflux disease)   . History of cardiac catheterization    a. Myoview 2/15 with anteroapical ischemia, EF 64% (intermediate risk) >> LHC - normal cos, EF 55-60%  . Hypertension   . Neuromuscular disorder (HCC)    arotic Aneurysm  . Obesity (BMI 35.0-39.9 without comorbidity)   . Pneumonia   . Stroke [facility])    with some resid L sided weakness  . Thoracic ascending aortic aneurysm (HCC)    a. CT 1/17: ascending aorta 4.2 x 4.0 cm    PAST SURGICAL HISTORY: Past Surgical History:  Procedure Laterality Date  . ABDOMINAL HYSTERECTOMY  2006   Partial, one ovary left   . CARDIAC CATHETERIZATION  2004   [place] per pt.  . CHOLECYSTECTOMY    . EAR CYST EXCISION Left 09/20/2016   Procedure: EXCISION  LEFT EAR LESION;  Surgeon: Newman Pies, MD;  Location: [facility] OR;  Service: ENT;  Laterality: Left;  . LEFT HEART CATHETERIZATION WITH CORONARY ANGIOGRAM N/A 01/09/2014   Procedure: LEFT HEART CATHETERIZATION WITH CORONARY ANGIOGRAM;  Surgeon: Marykay Lex, MD;  Location: [facility] CATH LAB;  Service: Cardiovascular;  Laterality: N/A;  . SKIN SPLIT GRAFT Left 09/20/2016   Procedure: SKIN GRAFT SPLIT THICKNESS;  Surgeon: Newman Pies, MD;  Location: [facility] OR;  Service: ENT;  Laterality: Left;  . TEMPOROMANDIBULAR JOINT SURGERY    . TONSILLECTOMY    . WISDOM TOOTH EXTRACTION      FAMILY HISTORY: Family History  Problem Relation Age of Onset  . Heart disease Father   . Pancreatitis Mother   . Diabetes Maternal Grandmother   . Heart disease Brother     SOCIAL HISTORY: Social History   Social History  . Marital status: Married    Spouse name: N/A  . Number of children: N/A   . Years of education: N/A   Occupational History  . Caregiver for the elderly Retired   Social History Main Topics  . Smoking status: Never Smoker  . Smokeless tobacco: Never Used  . Alcohol use No  . Drug use: No  . Sexual activity: No   Other Topics Concern  . Not on file   Social History Narrative   Family History Details: Her mother died at age 76 of pancreatitis. Her father died of mitral valve disease at age 48. She has a brother who has had myocardial infarction and also has had bypass surgery and a sister who    has a pacemaker.           PHYSICAL EXAM  Vitals:   10/30/16 1440  BP: 123/84  Pulse: 75  Weight: 194 lb (88 kg)  Height: 5\' 3"  (1.6 m)   Body mass index is 34.37 kg/m.  Generalized:  Well developed, in no acute distress  Head: normocephalic and atraumatic,. Oropharynx benign  Neck: Supple, no carotid bruits  Cardiac: Regular rate rhythm, no murmur  Musculoskeletal: No deformity   Neurological examination   Mentation: Alert oriented to time, place, history taking. Attention span and concentration appropriate. Recent and remote memory intact.  Follows all commands speech and language fluent. Ess 15  Cranial nerve II-XII: Fundoscopic exam reveals sharp disc margins.Pupils were equal round reactive to light extraocular movements were full, visual field were full on confrontational test. Facial sensation and strength were normal. hearing was intact to finger rubbing bilaterally. Uvula tongue midline. head turning and shoulder shrug were normal and symmetric.Tongue protrusion into cheek strength was normal. Motor: normal bulk and tone, full strength in the BUE, BLE, fine finger movements normal, no pronator drift. No focal weakness Sensory: normal and symmetric to light touch, pinprick, and  Vibration, in the upper and lower extremities  Coordination: finger-nose-finger, heel-to-shin bilaterally, no dysmetria Reflexes: 1+ upper lower and symmetric, plantar  responses were flexor bilaterally. Gait and Station: Rising up from seated position without assistance, normal stance,  moderate stride, good arm swing, smooth turning, able to perform tiptoe, and heel walking without difficulty. Tandem gait is steady. No assistive device  DIAGNOSTIC DATA (LABS, IMAGING, TESTING) - I reviewed patient records, labs, notes, testing and imaging myself where available.  Lab Results  Component Value Date   WBC 6.3 09/13/2016   HGB 13.5 09/13/2016   HCT 41.7 09/13/2016   MCV 92.3 09/13/2016   PLT 302 09/13/2016      Component Value Date/Time   NA 139 09/13/2016 1208   K 3.6 09/13/2016 1208   CL 104 09/13/2016 1208   CO2 27 09/13/2016 1208   GLUCOSE 133 (H) 09/13/2016 1208   BUN 12 09/13/2016 1208   CREATININE 0.92 09/13/2016 1208   CREATININE 0.90 12/02/2015 1330   CALCIUM 9.6 09/13/2016 1208   PROT 6.9 08/26/2016 1937   ALBUMIN 3.5 08/26/2016 1937   AST 20 08/26/2016 1937   ALT 13 (L) 08/26/2016 1937   ALKPHOS 78 08/26/2016 1937   BILITOT 0.3 08/26/2016 1937   GFRNONAA >60 09/13/2016 1208   GFRAA >60 09/13/2016 1208   Lab Results  Component Value Date   CHOL 148 08/27/2016   HDL 38 (L) 08/27/2016   LDLCALC 96 08/27/2016   TRIG 72 08/27/2016   CHOLHDL 3.9 08/27/2016   Lab Results  Component Value Date   HGBA1C 8.4 (H) 08/27/2016   No results found for: [id number] Lab Results  Component Value Date   TSH 0.322 (L) 12/07/2015      ASSESSMENT AND PLAN  70 y.o. year old female  has a past medical history of ; Diabetes mellitus;  Hypertension; Neuromuscular disorder (HCC); Obesity (BMI 35.0-39.9 without comorbidity);  Stroke [facility]); and New complaint of daytime drowsiness and snoring  PLAN: Stressed the importance of management of risk factors to prevent further stroke Continue Aspirin for secondary stroke prevention Maintain strict control of hypertension with blood pressure goal below 130/90, today's reading123/84  continue  antihypertensive medications Control of diabetes with hemoglobin A1c below 6.5 followed by primary care most recent hemoglobin A1c 8.4 on 08/27/16  continue diabetic medications Cholesterol with LDL cholesterol less than 70, followed by primary care,  most recent 96 continue statin drugs Lipitor Exercise by walking, slowly increase , eat healthy diet with whole grains,  fresh fruits and vegetables Sleep study for daytime drowsiness history of snoring Follow up in 3 months  Discussed risk for recurrent stroke/ TIA and answered additional questions This was a prolonged visit requiring 40 minutes  with extensive review of history, hospital chart, counseling and answering questions Nilda RiggsNancy Carolyn Jesenia Spera, [facility] HospitalGNP, [facility], APRN  [facility] [address], [address] [place], [place] [address] [phone]

## 2016-11-28 ENCOUNTER — Institutional Professional Consult (permissible substitution): Payer: Medicare Other | Admitting: Neurology

## 2017-02-05 ENCOUNTER — Ambulatory Visit: Payer: Medicare Other | Admitting: Nurse Practitioner

## 2017-02-06 ENCOUNTER — Encounter: Payer: Self-pay | Admitting: Nurse Practitioner

## 2017-03-18 ENCOUNTER — Other Ambulatory Visit: Payer: Self-pay | Admitting: Physician Assistant

## 2017-03-18 DIAGNOSIS — I10 Essential (primary) hypertension: Secondary | ICD-10-CM

## 2017-03-19 ENCOUNTER — Other Ambulatory Visit: Payer: Self-pay | Admitting: Physician Assistant

## 2017-03-19 DIAGNOSIS — I10 Essential (primary) hypertension: Secondary | ICD-10-CM

## 2017-05-03 ENCOUNTER — Other Ambulatory Visit: Payer: Self-pay | Admitting: Physician Assistant

## 2017-05-03 DIAGNOSIS — I10 Essential (primary) hypertension: Secondary | ICD-10-CM

## 2017-06-19 ENCOUNTER — Emergency Department (HOSPITAL_COMMUNITY): Payer: Medicare Other

## 2017-06-19 ENCOUNTER — Emergency Department (HOSPITAL_COMMUNITY)
Admission: EM | Admit: 2017-06-19 | Discharge: 2017-06-19 | Disposition: A | Discharge status: Home / Self Care | Payer: Medicare Other | Attending: Emergency Medicine | Admitting: Emergency Medicine

## 2017-06-19 ENCOUNTER — Encounter (HOSPITAL_COMMUNITY): Payer: Self-pay | Admitting: *Deleted

## 2017-06-19 DIAGNOSIS — R0789 Other chest pain: Secondary | ICD-10-CM | POA: Insufficient documentation

## 2017-06-19 DIAGNOSIS — Z7984 Long term (current) use of oral hypoglycemic drugs: Secondary | ICD-10-CM | POA: Diagnosis not present

## 2017-06-19 DIAGNOSIS — I110 Hypertensive heart disease with heart failure: Secondary | ICD-10-CM | POA: Diagnosis not present

## 2017-06-19 DIAGNOSIS — Z79899 Other long term (current) drug therapy: Secondary | ICD-10-CM | POA: Insufficient documentation

## 2017-06-19 DIAGNOSIS — I5032 Chronic diastolic (congestive) heart failure: Secondary | ICD-10-CM | POA: Insufficient documentation

## 2017-06-19 DIAGNOSIS — Z8673 Personal history of transient ischemic attack (TIA), and cerebral infarction without residual deficits: Secondary | ICD-10-CM | POA: Diagnosis not present

## 2017-06-19 DIAGNOSIS — E119 Type 2 diabetes mellitus without complications: Secondary | ICD-10-CM | POA: Diagnosis not present

## 2017-06-19 DIAGNOSIS — Z7982 Long term (current) use of aspirin: Secondary | ICD-10-CM | POA: Diagnosis not present

## 2017-06-19 DIAGNOSIS — R079 Chest pain, unspecified: Secondary | ICD-10-CM

## 2017-06-19 LAB — BASIC METABOLIC PANEL
Anion gap: 9 (ref 5–15)
BUN: 13 mg/dL (ref 6–20)
CHLORIDE: 102 mmol/L (ref 101–111)
CO2: 26 mmol/L (ref 22–32)
CREATININE: 0.99 mg/dL (ref 0.44–1.00)
Calcium: 9.5 mg/dL (ref 8.9–10.3)
GFR calc Af Amer: >60 mL/min (ref >60)
GFR calc non Af Amer: 56 mL/min — ABNORMAL LOW (ref >60)
Glucose, Bld: 228 mg/dL — ABNORMAL HIGH (ref 65–99)
POTASSIUM: 3.8 mmol/L (ref 3.5–5.1)
SODIUM: 137 mmol/L (ref 135–145)

## 2017-06-19 LAB — I-STAT TROPONIN, ED: Troponin i, poc: 0 ng/mL (ref 0.00–0.08)

## 2017-06-19 LAB — CBC
HEMATOCRIT: 40.7 % (ref 36.0–46.0)
Hemoglobin: 13.3 g/dL (ref 12.0–15.0)
MCH: 29.2 pg (ref 26.0–34.0)
MCHC: 32.7 g/dL (ref 30.0–36.0)
MCV: 89.5 fL (ref 78.0–100.0)
PLATELETS: 257 10*3/uL (ref 150–400)
RBC: 4.55 MIL/uL (ref 3.87–5.11)
RDW: 14.4 % (ref 11.5–15.5)
WBC: 6.7 10*3/uL (ref 4.0–10.5)

## 2017-06-19 MED ORDER — IOPAMIDOL (ISOVUE-370) INJECTION 76%
INTRAVENOUS | Status: AC
  Administered 2017-06-19: 100 mL via INTRAVENOUS
  Filled 2017-06-19: qty 100

## 2017-06-19 MED ORDER — MORPHINE SULFATE (PF) 4 MG/ML IV SOLN
4.0000 mg | Freq: Once | INTRAVENOUS | Status: AC
  Administered 2017-06-19: 4 mg via INTRAVENOUS
  Filled 2017-06-19: qty 1

## 2017-06-19 MED ORDER — SODIUM CHLORIDE 0.9 % IV SOLN
INTRAVENOUS | Status: DC
  Administered 2017-06-19: 12:00:00 via INTRAVENOUS

## 2017-06-19 NOTE — ED Triage Notes (Signed)
Pt states woke up at 0200 with left sided chest pain that radiates down left arm.  Pt has history of aortic aneurysm per her and is followed.  Pt states she is having 8/10 pain

## 2017-06-19 NOTE — ED Notes (Signed)
Patient transported to CT 

## 2017-06-19 NOTE — ED Provider Notes (Signed)
[facility]-EMERGENCY DEPT Provider Note   CSN: [id number] Arrival date & time: 06/19/17  8295     History   Chief Complaint Chief Complaint  Patient presents with  . Chest Pain    HPI [name] [name] is a 71 y.o. female.  HPI   71 year old female chest pain. Left-sided. Onset around 2:00 this morning. Persistent since then. No appreciable exacerbating relieving factors. No dyspnea. No fevers or chills. No unusual leg pain or swelling.  Past Medical History:  Diagnosis Date  . Arthritis   . Bronchitis    hx  . Chest pain 2009   MV with no scar or ischemia, EF 65%  . Complication of anesthesia    claustrophobic !  . Diabetes mellitus   . GERD (gastroesophageal reflux disease)   . History of cardiac catheterization    a. Myoview 2/15 with anteroapical ischemia, EF 64% (intermediate risk) >> LHC - normal cos, EF 55-60%  . Hypertension   . Neuromuscular disorder (HCC)    arotic Aneurysm  . Obesity (BMI 35.0-39.9 without comorbidity)   . Pneumonia   . Stroke [facility])    with some resid L sided weakness  . Thoracic ascending aortic aneurysm (HCC)    a. CT 1/17: ascending aorta 4.2 x 4.0 cm    Patient Active Problem List   Diagnosis Date Noted  . Chronic sinusitis   . Complicated migraine   . Pure hypercholesterolemia   . TIA (transient ischemic attack)   . Atypical chest pain   . Cerebral infarction (HCC) 08/26/2016  . Chronic diastolic heart failure, NYHA class 2 (HCC) 12/07/2015  . Pulmonary HTN (HCC) 12/07/2015  . Chronic chest pain 12/07/2015  . Thoracic aortic aneurysm (HCC) 12/07/2015  . Left sided numbness 12/07/2015  . Bradycardia 05/06/2012  . CVA (cerebral vascular accident) (HCC) 05/05/2012  . Obesity due to excess calories 04/25/2012  . Cystocele 03/22/2012  . Type 2 diabetes mellitus without complication, without long-term current use of insulin (HCC) 03/22/2012  . Essential hypertension 03/22/2012  . gerd 03/22/2012    Past Surgical History:    Procedure Laterality Date  . ABDOMINAL HYSTERECTOMY  2006   Partial, one ovary left   . CARDIAC CATHETERIZATION  2004   [place], [place] per pt.  . CHOLECYSTECTOMY    . EAR CYST EXCISION Left 09/20/2016   Procedure: EXCISION  LEFT EAR LESION;  Surgeon: Newman Pies, MD;  Location: [facility] OR;  Service: ENT;  Laterality: Left;  . LEFT HEART CATHETERIZATION WITH CORONARY ANGIOGRAM N/A 01/09/2014   Procedure: LEFT HEART CATHETERIZATION WITH CORONARY ANGIOGRAM;  Surgeon: Marykay Lex, MD;  Location[facility] CATH LAB;  Service: Cardiovascular;  Laterality: N/A;  . SKIN SPLIT GRAFT Left 09/20/2016   Procedure: SKIN GRAFT SPLIT THICKNESS;  Surgeon: Newman Pies, MD;  Location: [facility] OR;  Service: ENT;  Laterality: Left;  . TEMPOROMANDIBULAR JOINT SURGERY    . TONSILLECTOMY    . WISDOM TOOTH EXTRACTION      OB History    Gravida Para Term Preterm AB Living   4 4 4  0 0 4   SAB TAB Ectopic Multiple Live Births   0 0 0 0        Obstetric Comments   Delivered 4 large babies vaginally       Home Medications    Prior to Admission medications   Medication Sig Start Date End Date Taking? Authorizing Provider  acetaminophen (TYLENOL) 325 MG tablet Take 2 tablets (650 mg total) by mouth every  4 (four) hours as needed for mild pain (or temp >/= 99.5 F). 08/29/16  Yes Randel PiggSilva Zapata, Dorma RussellEdwin, MD  albuterol (PROVENTIL HFA;VENTOLIN HFA) 108 (90 BASE) MCG/ACT inhaler Inhale 1-2 puffs into the lungs every 6 (six) hours as needed for wheezing. 07/29/13  Yes Reuben LikesKeller, David C, MD  aspirin 325 MG tablet Take 1 tablet (325 mg total) by mouth daily. 08/29/16  Yes Randel PiggSilva Zapata, Dorma RussellEdwin, MD  calcium carbonate (OSCAL) 1500 (600 Ca) MG TABS tablet Take 1,500 mg by mouth 2 (two) times daily with a meal.   Yes [provider]  Cholecalciferol (VITAMIN D3) 1000 units CAPS Take 1 capsule by mouth daily.   Yes [provider]  linagliptin (TRADJENTA) 5 MG TABS tablet Take 5 mg by mouth daily.   Yes [provider]   lisinopril-hydrochlorothiazide (PRINZIDE,ZESTORETIC) 10-12.5 MG tablet TAKE 1 TABLET BY MOUTH DAILY 12/27/15  Yes Dove, Myra C, MD  metFORMIN (GLUCOPHAGE-XR) 500 MG 24 hr tablet Take 500 mg by mouth daily with supper. 04/23/17  Yes [provider]  metoprolol succinate (TOPROL-XL) 50 MG 24 hr tablet TAKE 1 TABLET BY MOUTH DAILY WITH OR IMMEDIATELY FOLLOWING A MEAL Patient taking differently: TAKE 1 TABLET (50mg ) BY MOUTH DAILY WITH OR IMMEDIATELY FOLLOWING A MEAL 03/19/17  Yes Weaver, Scott T, PA-C  omeprazole (PRILOSEC) 40 MG capsule Take 40 mg by mouth daily.   Yes [provider]  spironolactone (ALDACTONE) 25 MG tablet Take 12.5 mg by mouth daily.   Yes [provider]  triamcinolone ointment (KENALOG) 0.5 % Apply 1 application topically 2 (two) times daily as needed for rash. 04/23/17  Yes [provider]  atorvastatin (LIPITOR) 10 MG tablet Take 1 tablet (10 mg total) by mouth daily at 6 PM. Patient not taking: Reported on 06/19/2017 12/09/15   Rai, Delene Ruffiniipudeep K, MD  potassium chloride SA (K-DUR,KLOR-CON) 20 MEQ tablet Take 1 tablet (20 mEq total) by mouth daily. Patient not taking: Reported on 06/19/2017 12/03/15   Beatrice LecherWeaver, Scott T, PA-C    Family History Family History  Problem Relation Age of Onset  . Heart disease Father   . Pancreatitis Mother   . Diabetes Maternal Grandmother   . Heart disease Brother     Social History Social History  Substance Use Topics  . Smoking status: Never Smoker  . Smokeless tobacco: Never Used  . Alcohol use No     Allergies   Metformin; Adhesive [tape]; Codeine; and Empagliflozin   Review of Systems Review of Systems  All systems reviewed and negative, other than as noted in HPI.  Physical Exam Updated Vital Signs BP (!) 153/96   Pulse 72   Temp 98.1 F (36.7 C) (Oral)   Resp 13   SpO2 100%   Physical Exam  Constitutional: She appears well-developed and well-nourished. No distress.  HENT:  Head:  Normocephalic and atraumatic.  Eyes: Conjunctivae are normal. Right eye exhibits no discharge. Left eye exhibits no discharge.  Neck: Neck supple.  Cardiovascular: Normal rate, regular rhythm and normal heart sounds.  Exam reveals no gallop and no friction rub.   No murmur heard. Pulmonary/Chest: Effort normal and breath sounds normal. No respiratory distress.  Abdominal: Soft. She exhibits no distension. There is no tenderness.  Musculoskeletal: She exhibits no edema or tenderness.  Lower extremities symmetric as compared to each other. No calf tenderness. Negative Homan's. No palpable cords.   Neurological: She is alert.  Skin: Skin is warm and dry.  Psychiatric: She has a normal  mood and affect. Her behavior is normal. Thought content normal.  Nursing note and vitals reviewed.    ED Treatments / Results  Labs (all labs ordered are listed, but only abnormal results are displayed) Labs Reviewed  BASIC METABOLIC PANEL - Abnormal; Notable for the following:       Result Value   Glucose, Bld 228 (*)    GFR calc non Af Amer 56 (*)    All other components within normal limits  CBC  I-STAT TROPONIN, ED    EKG  EKG Interpretation  Date/Time:  Tuesday June 19 2017 09:33:55 EDT Ventricular Rate:  78 PR Interval:  152 QRS Duration: 88 QT Interval:  412 QTC Calculation: 469 R Axis:   -72 Text Interpretation:  Normal sinus rhythm Left anterior fascicular block Cannot rule out Anterior infarct , age undetermined Abnormal ECG No significant change since last tracing Confirmed by Raeford RazorKohut, Jennet Scroggin [phone]([id number]) on 06/19/2017 10:13:49 AM       Radiology Dg Chest 2 View  Result Date: 06/19/2017 CLINICAL DATA:  Left-sided chest pain radiating down the left arm. History of diabetes, hypertension, ascending thoracic aortic aneurysm, pulmonary hypertension. EXAM: CHEST  2 VIEW COMPARISON:  Chest x-ray of December 07, 2015 FINDINGS: The lungs are well-expanded and clear. The heart and pulmonary  vascularity are normal. The mediastinum is normal in width. The trachea is midline. There is tortuosity of the ascending and descending thoracic aorta. There is no pleural effusion. The bony thorax exhibits no acute abnormality. IMPRESSION: Tortuous thoracic aorta which is been previously described. No acute cardiopulmonary abnormality. Electronically Signed   By: David  Swazil[place]Jordan M.D.   On: 06/19/2017 11:50   Ct Angio Chest/abd/pel For Dissection W And/or Wo Contrast  Result Date: 06/19/2017 CLINICAL DATA:  Chest pain radiating to left arm EXAM: CT ANGIOGRAPHY CHEST, ABDOMEN AND PELVIS TECHNIQUE: Multidetector CT imaging through the chest, abdomen and pelvis was performed using the standard protocol during bolus administration of intravenous contrast. Multiplanar reconstructed images and MIPs were obtained and reviewed to evaluate the vascular anatomy. CONTRAST:  100 cc Isovue 370 COMPARISON:  12/07/2015.  11/25/2015. FINDINGS: CTA CHEST FINDINGS Cardiovascular: No evidence of aortic dissection or intramural hematoma. Maximal diameter of the ascending aorta is 4.1 cm compared with 4.1 cm on the prior study based on my direct measurements. Mild atherosclerotic calcification and plaque in the aortic arch. Great vessels are patent within the confines of the examination. There is no obvious evidence of acute pulmonary thromboembolism. Mediastinum/Nodes: Visualized thyroid is unremarkable. No evidence of abnormal mediastinal adenopathy or pericardial effusion. Unremarkable esophagus. Lungs/Pleura: There is some degree of centrilobular emphysema. Patchy pulmonary parenchymal opacity at the medial left base on image 65 is series 8 is stable compared with the prior study dated 11/25/2015. Musculoskeletal: No vertebral compression deformity or acute rib fracture. Subdermal sebaceous cyst over the left axilla is node on on image 47. Review of the MIP images confirms the above findings. CTA ABDOMEN AND PELVIS FINDINGS  VASCULAR Aorta: Aorta is non aneurysmal and patent. No evidence of dissection. Atherosclerotic calcifications of the distal abdominal aorta is noted. Celiac: Patent. SMA: Patent. Renals: Single renal arteries are patent. Minimal atherosclerotic calcification at the origin of the left renal artery. IMA: Patent. Inflow: Common, internal, and external iliac arteries are patent without evidence of dissection. Review of the MIP images confirms the above findings. NON-VASCULAR Hepatobiliary: Postcholecystectomy. Unremarkable arterial phase liver. Pancreas: Unremarkable Spleen: Unremarkable Adrenals/Urinary Tract: The left adrenal gland remains lobulated and stable in appearance. Right  adrenal gland is within normal limits. Kidneys are within normal limits. Stomach/Bowel: Stomach is within normal limits. Duodenum has a normal appearance. There is no evidence of small-bowel obstruction. Normal appendix. Diverticulosis throughout all portions of the colon is present but there is no obvious mass in the colon. There is no evidence of acute diverticulitis. Lymphatic: No abnormal retroperitoneal adenopathy. Reproductive: Uterus is absent.  Adnexa are within normal limits. Other: No free-fluid. Musculoskeletal: There is no vertebral compression deformity. Degenerative disc disease at L5-S1 is noted. Review of the MIP images confirms the above findings. IMPRESSION: Vascular: No evidence of aortic dissection for acute vascular pathology Nonvascular: No acute intra- abdominal or intrathoracic pathology. Chronic changes are noted. Emphysema. Aortic atherosclerosis. Electronically Signed   By: Jolaine Click M.D.   On: 06/19/2017 13:00    Procedures Procedures (including critical care time)  Medications Ordered in ED Medications - No data to display   Initial Impression / Assessment and Plan / ED Course  I have reviewed the triage vital signs and the nursing notes.  Pertinent labs & imaging results that were available  during my care of the patient were reviewed by me and considered in my medical decision making (see chart for details).     71yF with CP. Seems typical for ACS with constant duration since around 0200. EKG similar to prior. Troponin normal. LHC 01/09/14 showed normal coronaries and normal LVEF of 55-60% w/o focal segmental wall motion abnormalities. CT w/ stable ascending thoracic aortic aneurysm. No acute findings. Doubt PE or other emergent process.   Final Clinical Impressions(s) / ED Diagnoses   Final diagnoses:  Chest pain, unspecified type    New Prescriptions New Prescriptions   No medications on file     Raeford Razor, MD 07/05/17 1628

## 2017-06-19 NOTE — ED Notes (Signed)
ED Provider at bedside. 

## 2017-06-20 ENCOUNTER — Other Ambulatory Visit: Payer: Self-pay | Admitting: Physician Assistant

## 2017-06-20 DIAGNOSIS — I10 Essential (primary) hypertension: Secondary | ICD-10-CM

## 2017-07-10 ENCOUNTER — Other Ambulatory Visit: Payer: Self-pay | Admitting: Physician Assistant

## 2017-07-10 DIAGNOSIS — I10 Essential (primary) hypertension: Secondary | ICD-10-CM

## 2017-07-15 ENCOUNTER — Other Ambulatory Visit: Payer: Self-pay | Admitting: Physician Assistant

## 2017-07-15 DIAGNOSIS — I10 Essential (primary) hypertension: Secondary | ICD-10-CM

## 2017-08-28 ENCOUNTER — Emergency Department (HOSPITAL_COMMUNITY): Payer: Medicare Other

## 2017-08-28 ENCOUNTER — Emergency Department (HOSPITAL_COMMUNITY)
Admission: EM | Admit: 2017-08-28 | Discharge: 2017-08-28 | Disposition: A | Discharge status: Home / Self Care | Payer: Medicare Other | Attending: Emergency Medicine | Admitting: Emergency Medicine

## 2017-08-28 ENCOUNTER — Encounter (HOSPITAL_COMMUNITY): Payer: Self-pay | Admitting: Nurse Practitioner

## 2017-08-28 DIAGNOSIS — Z79899 Other long term (current) drug therapy: Secondary | ICD-10-CM | POA: Diagnosis not present

## 2017-08-28 DIAGNOSIS — G43109 Migraine with aura, not intractable, without status migrainosus: Secondary | ICD-10-CM | POA: Insufficient documentation

## 2017-08-28 DIAGNOSIS — R4701 Aphasia: Secondary | ICD-10-CM | POA: Diagnosis present

## 2017-08-28 DIAGNOSIS — Z7982 Long term (current) use of aspirin: Secondary | ICD-10-CM | POA: Diagnosis not present

## 2017-08-28 DIAGNOSIS — N3000 Acute cystitis without hematuria: Secondary | ICD-10-CM | POA: Insufficient documentation

## 2017-08-28 DIAGNOSIS — I5032 Chronic diastolic (congestive) heart failure: Secondary | ICD-10-CM | POA: Diagnosis not present

## 2017-08-28 DIAGNOSIS — Z7984 Long term (current) use of oral hypoglycemic drugs: Secondary | ICD-10-CM | POA: Diagnosis not present

## 2017-08-28 DIAGNOSIS — I110 Hypertensive heart disease with heart failure: Secondary | ICD-10-CM | POA: Diagnosis not present

## 2017-08-28 DIAGNOSIS — E119 Type 2 diabetes mellitus without complications: Secondary | ICD-10-CM | POA: Diagnosis not present

## 2017-08-28 DIAGNOSIS — R531 Weakness: Secondary | ICD-10-CM

## 2017-08-28 LAB — URINALYSIS, ROUTINE W REFLEX MICROSCOPIC
BILIRUBIN URINE: NEGATIVE
Glucose, UA: NEGATIVE mg/dL
KETONES UR: NEGATIVE mg/dL
NITRITE: NEGATIVE
PH: 8 (ref 5.0–8.0)
Protein, ur: NEGATIVE mg/dL
SPECIFIC GRAVITY, URINE: 1.002 — AB (ref 1.005–1.030)

## 2017-08-28 LAB — I-STAT CHEM 8, ED
BUN: 17 mg/dL (ref 6–20)
CHLORIDE: 105 mmol/L (ref 101–111)
CREATININE: 1.1 mg/dL — AB (ref 0.44–1.00)
Calcium, Ion: 1.02 mmol/L — ABNORMAL LOW (ref 1.15–1.40)
GLUCOSE: 169 mg/dL — AB (ref 65–99)
HEMATOCRIT: 42 % (ref 36.0–46.0)
HEMOGLOBIN: 14.3 g/dL (ref 12.0–15.0)
POTASSIUM: 3.6 mmol/L (ref 3.5–5.1)
Sodium: 140 mmol/L (ref 135–145)
TCO2: 25 mmol/L (ref 22–32)

## 2017-08-28 LAB — DIFFERENTIAL
Basophils Absolute: 0 10*3/uL (ref 0.0–0.1)
Basophils Relative: 0 %
EOS ABS: 0.2 10*3/uL (ref 0.0–0.7)
EOS PCT: 2 %
LYMPHS ABS: 3.2 10*3/uL (ref 0.7–4.0)
LYMPHS PCT: 48 %
MONO ABS: 0.4 10*3/uL (ref 0.1–1.0)
MONOS PCT: 6 %
Neutro Abs: 2.9 10*3/uL (ref 1.7–7.7)
Neutrophils Relative %: 44 %

## 2017-08-28 LAB — COMPREHENSIVE METABOLIC PANEL
ALBUMIN: 3.6 g/dL (ref 3.5–5.0)
ALK PHOS: 69 U/L (ref 38–126)
ALT: 12 U/L — ABNORMAL LOW (ref 14–54)
ANION GAP: 12 (ref 5–15)
AST: 19 U/L (ref 15–41)
BILIRUBIN TOTAL: 0.3 mg/dL (ref 0.3–1.2)
BUN: 13 mg/dL (ref 6–20)
CALCIUM: 9.1 mg/dL (ref 8.9–10.3)
CO2: 20 mmol/L — ABNORMAL LOW (ref 22–32)
Chloride: 106 mmol/L (ref 101–111)
Creatinine, Ser: 1.31 mg/dL — ABNORMAL HIGH (ref 0.44–1.00)
GFR calc Af Amer: 46 mL/min — ABNORMAL LOW (ref >60)
GFR, EST NON AFRICAN AMERICAN: 40 mL/min — AB (ref >60)
GLUCOSE: 159 mg/dL — AB (ref 65–99)
POTASSIUM: 3.3 mmol/L — AB (ref 3.5–5.1)
Sodium: 138 mmol/L (ref 135–145)
TOTAL PROTEIN: 7 g/dL (ref 6.5–8.1)

## 2017-08-28 LAB — RAPID URINE DRUG SCREEN, HOSP PERFORMED
Amphetamines: NOT DETECTED
BARBITURATES: NOT DETECTED
Benzodiazepines: NOT DETECTED
COCAINE: NOT DETECTED
OPIATES: NOT DETECTED
TETRAHYDROCANNABINOL: NOT DETECTED

## 2017-08-28 LAB — CBC
HEMATOCRIT: 38.5 % (ref 36.0–46.0)
HEMOGLOBIN: 12.6 g/dL (ref 12.0–15.0)
MCH: 29.4 pg (ref 26.0–34.0)
MCHC: 32.7 g/dL (ref 30.0–36.0)
MCV: 90 fL (ref 78.0–100.0)
Platelets: 240 10*3/uL (ref 150–400)
RBC: 4.28 MIL/uL (ref 3.87–5.11)
RDW: 14.2 % (ref 11.5–15.5)
WBC: 6.7 10*3/uL (ref 4.0–10.5)

## 2017-08-28 LAB — APTT: aPTT: 34 seconds (ref 24–36)

## 2017-08-28 LAB — CBG MONITORING, ED: Glucose-Capillary: 166 mg/dL — ABNORMAL HIGH (ref 65–99)

## 2017-08-28 LAB — ETHANOL: Alcohol, Ethyl (B): <10 mg/dL (ref <10)

## 2017-08-28 LAB — PROTIME-INR
INR: 1.06
Prothrombin Time: 13.7 seconds (ref 11.4–15.2)

## 2017-08-28 LAB — I-STAT TROPONIN, ED: Troponin i, poc: 0 ng/mL (ref 0.00–0.08)

## 2017-08-28 MED ORDER — METOCLOPRAMIDE HCL 5 MG/ML IJ SOLN
10.0000 mg | Freq: Once | INTRAMUSCULAR | Status: AC
  Administered 2017-08-28: 10 mg via INTRAVENOUS
  Filled 2017-08-28: qty 2

## 2017-08-28 MED ORDER — CEPHALEXIN 500 MG PO CAPS: 500.0000 mg | ORAL_CAPSULE | Freq: Three times a day (TID) | ORAL | 0 refills | Status: DC

## 2017-08-28 MED ORDER — DEXTROSE 5 % IV SOLN
1.0000 g | Freq: Once | INTRAVENOUS | Status: AC
  Administered 2017-08-28: 1 g via INTRAVENOUS
  Filled 2017-08-28: qty 10

## 2017-08-28 MED ORDER — DIPHENHYDRAMINE HCL 50 MG/ML IJ SOLN
12.5000 mg | Freq: Once | INTRAMUSCULAR | Status: AC
  Administered 2017-08-28: 12.5 mg via INTRAVENOUS
  Filled 2017-08-28: qty 1

## 2017-08-28 MED ORDER — SODIUM CHLORIDE 0.9 % IV BOLUS (SEPSIS)
1000.0000 mL | Freq: Once | INTRAVENOUS | Status: AC
  Administered 2017-08-28: 1000 mL via INTRAVENOUS

## 2017-08-28 MED ORDER — LORAZEPAM 2 MG/ML IJ SOLN
1.0000 mg | Freq: Once | INTRAMUSCULAR | Status: AC
  Administered 2017-08-28: 1 mg via INTRAVENOUS
  Filled 2017-08-28: qty 1

## 2017-08-28 NOTE — ED Provider Notes (Signed)
[facility]-EMERGENCY DEPT Provider Note   CSN: [id number] Arrival date & time: 08/28/17  1811   An emergency department physician performed an initial assessment on this suspected stroke patient at 1815.  History   Chief Complaint Chief Complaint  Patient presents with  . Aphasia    HPI [name] [name] is a 71 y.o. female history of hypertension,  Reflux, diabetes, previous complex migraine here presenting with headache, slurred speech. Patient's last normal was around 7 AM. She lives at home by herself and around 4:30 PM family called and noticed that she has some slurred speech. Actually told me around 5:30 PM, she was sitting on her couch and she had some headaches as well as increased bilateral weakness and right sided chest pain. She was seen several months ago and was admitted and was diagnosed with complex migraines. Code stroke activated by EMS and neuro saw patient prior to my exam and code stroke was deactivated by EMS.   The history is provided by the patient.    Past Medical History:  Diagnosis Date  . Arthritis   . Bronchitis    hx  . Chest pain 2009   MV with no scar or ischemia, EF 65%  . Complication of anesthesia    claustrophobic !  . Diabetes mellitus   . GERD (gastroesophageal reflux disease)   . History of cardiac catheterization    a. Myoview 2/15 with anteroapical ischemia, EF 64% (intermediate risk) >> LHC - normal cos, EF 55-60%  . Hypertension   . Neuromuscular disorder (HCC)    arotic Aneurysm  . Obesity (BMI 35.0-39.9 without comorbidity)   . Pneumonia   . Stroke [facility])    with some resid L sided weakness  . Thoracic ascending aortic aneurysm (HCC)    a. CT 1/17: ascending aorta 4.2 x 4.0 cm    Patient Active Problem List   Diagnosis Date Noted  . Chronic sinusitis   . Complicated migraine   . Pure hypercholesterolemia   . TIA (transient ischemic attack)   . Atypical chest pain   . Cerebral infarction (HCC) 08/26/2016  . Chronic diastolic  heart failure, NYHA class 2 (HCC) 12/07/2015  . Pulmonary HTN (HCC) 12/07/2015  . Chronic chest pain 12/07/2015  . Thoracic aortic aneurysm (HCC) 12/07/2015  . Left sided numbness 12/07/2015  . Bradycardia 05/06/2012  . CVA (cerebral vascular accident) (HCC) 05/05/2012  . Obesity due to excess calories 04/25/2012  . Cystocele 03/22/2012  . Type 2 diabetes mellitus without complication, without long-term current use of insulin (HCC) 03/22/2012  . Essential hypertension 03/22/2012  . gerd 03/22/2012    Past Surgical History:  Procedure Laterality Date  . ABDOMINAL HYSTERECTOMY  2006   Partial, one ovary left   . CARDIAC CATHETERIZATION  2004   [place], [place] per pt.  . CHOLECYSTECTOMY    . EAR CYST EXCISION Left 09/20/2016   Procedure: EXCISION  LEFT EAR LESION;  Surgeon: Newman Pies, MD;  Location: [facility] OR;  Service: ENT;  Laterality: Left;  . LEFT HEART CATHETERIZATION WITH CORONARY ANGIOGRAM N/A 01/09/2014   Procedure: LEFT HEART CATHETERIZATION WITH CORONARY ANGIOGRAM;  Surgeon: Marykay Lex, MD;  Location: [facility] CATH LAB;  Service: Cardiovascular;  Laterality: N/A;  . SKIN SPLIT GRAFT Left 09/20/2016   Procedure: SKIN GRAFT SPLIT THICKNESS;  Surgeon: Newman Pies, MD;  Location: [facility] OR;  Service: ENT;  Laterality: Left;  . TEMPOROMANDIBULAR JOINT SURGERY    . TONSILLECTOMY    . WISDOM TOOTH EXTRACTION  OB History    Gravida Para Term Preterm AB Living   0 0 4   SAB TAB Ectopic Multiple Live Births   0 0 0 0        Obstetric Comments   Delivered 4 large babies vaginally       Home Medications    Prior to Admission medications   Medication Sig Start Date End Date Taking? Authorizing Provider  albuterol (PROVENTIL HFA;VENTOLIN HFA) 108 (90 BASE) MCG/ACT inhaler Inhale 1-2 puffs into the lungs every 6 (six) hours as needed for wheezing. 07/29/13  Yes Reuben Likes, MD  aspirin 325 MG tablet Take 1 tablet (325 mg total) by mouth daily. 08/29/16  Yes Randel Pigg,  Dorma Russell, MD  atorvastatin (LIPITOR) 10 MG tablet Take 1 tablet (10 mg total) by mouth daily at 6 PM. Patient taking differently: Take 10 mg by mouth daily.  12/09/15  Yes Rai, Ripudeep K, MD  calcium carbonate (OSCAL) 1500 (600 Ca) MG TABS tablet Take 1,500 mg by mouth 2 (two) times daily with a meal.   Yes [provider]  Cholecalciferol (VITAMIN D3) 1000 units CAPS Take 1 capsule by mouth daily.   Yes [provider]  linagliptin (TRADJENTA) 5 MG TABS tablet Take 5 mg by mouth daily.   Yes [provider]  lisinopril-hydrochlorothiazide (PRINZIDE,ZESTORETIC) 10-12.5 MG tablet TAKE 1 TABLET BY MOUTH DAILY 12/27/15  Yes Dove, Myra C, MD  metFORMIN (GLUCOPHAGE-XR) 500 MG 24 hr tablet Take 500 mg by mouth daily with supper. 04/23/17  Yes [provider]  metoprolol succinate (TOPROL-XL) 50 MG 24 hr tablet TAKE 1 TABLET BY MOUTH DAILY WITH OR IMMEDIATELY FOLLOWING A MEAL Patient taking differently: TAKE 50 mg TABLET BY MOUTH DAILY WITH OR IMMEDIATELY FOLLOWING A MEAL 07/16/17  Yes Weaver, Scott T, PA-C  omeprazole (PRILOSEC) 40 MG capsule Take 40 mg by mouth daily.   Yes [provider]  potassium chloride SA (K-DUR,KLOR-CON) 20 MEQ tablet Take 1 tablet (20 mEq total) by mouth daily. 12/03/15  Yes Weaver, Scott T, PA-C  spironolactone (ALDACTONE) 25 MG tablet Take 12.5 mg by mouth daily.   Yes [provider]  triamcinolone ointment (KENALOG) 0.5 % Apply 1 application topically 2 (two) times daily as needed for rash. 04/23/17  Yes [provider]  acetaminophen (TYLENOL) 325 MG tablet Take 2 tablets (650 mg total) by mouth every 4 (four) hours as needed for mild pain (or temp >/= 99.5 F). Patient not taking: Reported on 08/28/2017 08/29/16   Lenox Ponds, MD    Family History Family History  Problem Relation Age of Onset  . Heart disease Father   . Pancreatitis Mother   . Diabetes Maternal Grandmother   . Heart disease Brother      Social History Social History  Substance Use Topics  . Smoking status: Never Smoker  . Smokeless tobacco: Never Used  . Alcohol use No     Allergies   Adhesive [tape]; Codeine; and Empagliflozin   Review of Systems Review of Systems  Neurological: Positive for weakness and headaches.  All other systems reviewed and are negative.    Physical Exam Updated Vital Signs BP 140/82   Pulse (!) 56   Temp 98.4 F (36.9 C)   Resp 18   Ht  (1.6 m)   Wt 83.7 kg (184 lb 8.4 oz)   SpO2 100%   BMI 32.69 kg/m   Physical Exam  Constitutional: She is oriented to  person, place, and time. She appears well-developed.  Slightly anxious   HENT:  Head: Normocephalic.  Eyes: Pupils are equal, round, and reactive to light. Conjunctivae and EOM are normal.  Neck: Normal range of motion. Neck supple.  Cardiovascular: Normal rate, regular rhythm and normal heart sounds.   Pulmonary/Chest: Effort normal and breath sounds normal. No respiratory distress. She has no wheezes. She has no rales.  Abdominal: Soft. Bowel sounds are normal. She exhibits no distension. There is no tenderness. There is no guarding.  Musculoskeletal: Normal range of motion. She exhibits no edema or deformity.  Neurological: She is alert and oriented to person, place, and time.  CN 2-12 intact. Nl strength throughout. Nl sensation throughout   Skin: Skin is warm.  Psychiatric: She has a normal mood and affect.  Nursing note and vitals reviewed.    ED Treatments / Results  Labs (all labs ordered are listed, but only abnormal results are displayed) Labs Reviewed  COMPREHENSIVE METABOLIC PANEL - Abnormal; Notable for the following:       Result Value   Potassium 3.3 (*)    CO2 20 (*)    Glucose, Bld 159 (*)    Creatinine, Ser 1.31 (*)    ALT 12 (*)    GFR calc non Af Amer 40 (*)    GFR calc Af Amer 46 (*)    All other components within normal limits  URINALYSIS, ROUTINE W REFLEX MICROSCOPIC -  Abnormal; Notable for the following:    APPearance CLOUDY (*)    Specific Gravity, Urine 1.002 (*)    Hgb urine dipstick MODERATE (*)    Leukocytes, UA LARGE (*)    Bacteria, UA MANY (*)    Squamous Epithelial / LPF 6-30 (*)    All other components within normal limits  I-STAT CHEM 8, ED - Abnormal; Notable for the following:    Creatinine, Ser 1.10 (*)    Glucose, Bld 169 (*)    Calcium, Ion 1.02 (*)    All other components within normal limits  CBG MONITORING, ED - Abnormal; Notable for the following:    Glucose-Capillary 166 (*)    All other components within normal limits  ETHANOL  CBC  DIFFERENTIAL  RAPID URINE DRUG SCREEN, HOSP PERFORMED  PROTIME-INR  APTT  I-STAT TROPONIN, ED    EKG  EKG Interpretation None       Radiology Dg Chest 2 View  Result Date: 08/28/2017 CLINICAL DATA:  Chest pain. EXAM: CHEST  2 VIEW COMPARISON:  Radiographs of June 19, 2017. FINDINGS: Stable cardiomediastinal silhouette. No pneumothorax or pleural effusion is noted. Both lungs are clear. The visualized skeletal structures are unremarkable. IMPRESSION: No active cardiopulmonary disease. Electronically Signed   By: Lupita Raider, M.D.   On: 08/28/2017 21:31   Mr Brain Wo Contrast  Result Date: 08/28/2017 CLINICAL DATA:  Initial evaluation for acute slurred speech, left-sided weakness. EXAM: MRI HEAD WITHOUT CONTRAST TECHNIQUE: Multiplanar, multiecho pulse sequences of the brain and surrounding structures were obtained without intravenous contrast. COMPARISON:  Prior CT from earlier same day as well as previous MRI from 08/27/2016. FINDINGS: Brain: Stable atrophy with chronic microvascular ischemic disease. Small remote right insular infarct again noted. No abnormal foci of restricted diffusion to suggest acute or subacute ischemia. Gray-white matter differentiation maintained. No acute or chronic intracranial hemorrhage. No mass lesion, midline shift or mass effect. No hydrocephalus. No  extra-axial fluid collection. Major dural sinuses are patent. Pituitary gland suprasellar region within normal limits. Midline  structures intact and normal. Vascular: Major intracranial vascular flow voids are maintained. Skull and upper cervical spine: Craniocervical junction normal. Upper cervical spine within normal limits. Bone marrow signal intensity normal. No scalp soft tissue abnormality. Sinuses/Orbits: Globes and orbital soft tissues within normal limits. Chronic right maxillary sinusitis. Scattered mucosal thickening within the ethmoidal air cells. No mastoid effusion. Inner ear structures normal. Other: None. IMPRESSION: 1. No acute intracranial infarct or other abnormality identified. 2. Stable atrophy with mild chronic small vessel ischemic disease and small remote right insular infarct. 3. Chronic right maxillary sinusitis. Electronically Signed   By: Rise Mu M.D.   On: 08/28/2017 21:28   Ct Head Code Stroke Wo Contrast  Result Date: 08/28/2017 CLINICAL DATA:  Code stroke.  Slurred speech EXAM: CT HEAD WITHOUT CONTRAST TECHNIQUE: Contiguous axial images were obtained from the base of the skull through the vertex without intravenous contrast. COMPARISON:  Head CT 08/26/2016.  Brain MRI 08/27/2016 FINDINGS: Brain: No evidence of acute infarction, hemorrhage, hydrocephalus, extra-axial collection or mass lesion/mass effect. Small remote right anterior insular infarct. Age normal brain volume and white matter appearance. Vascular: No hyperdense vessel.  Mild arterial calcification. Skull: No acute or aggressive finding. Sinuses/Orbits: Chronic right maxillary sinusitis with near complete opacification, sclerotic wall thickening, and calcified debris. These changes were also seen previously. Other: Text page prelim sent 08/28/2017 at 6:31 pm to Dr. Wilford Corner, who verbally acknowledged these results. ASPECTS [facility] Stroke Program Early CT Score) -left hemisphere - Ganglionic level infarction  (caudate, lentiform nuclei, internal capsule, insula, M1-M3 cortex): 7 - Supraganglionic infarction (M4-M6 cortex): 3 Total score (0-10 with 10 being normal): 10 IMPRESSION: 1. No acute finding. ASPECTS is 10. 2. Small remote right insular infarct. 3. Chronic right maxillary sinusitis. Electronically Signed   By: Marnee Spring M.D.   On: 08/28/2017 18:32    Procedures Procedures (including critical care time)  Medications Ordered in ED Medications  metoCLOPramide (REGLAN) injection 10 mg (10 mg Intravenous Given 08/28/17 1923)  diphenhydrAMINE (BENADRYL) injection 12.5 mg (12.5 mg Intravenous Given 08/28/17 1921)  sodium chloride 0.9 % bolus 1,000 mL (1,000 mLs Intravenous New Bag/Given 08/28/17 1921)  LORazepam (ATIVAN) injection 1 mg (1 mg Intravenous Given 08/28/17 1944)  cefTRIAXone (ROCEPHIN) 1 g in dextrose 5 % 50 mL IVPB (1 g Intravenous New Bag/Given 08/28/17 2149)     Initial Impression / Assessment and Plan / ED Course  I have reviewed the triage vital signs and the nursing notes.  Pertinent labs & imaging results that were available during my care of the patient were reviewed by me and considered in my medical decision making (see chart for details).     [name] [name] is a 71 y.o. female here with headache, weakness, trouble speaking. No slurred speech on my exam. NIH 0. Dr. Wilford Corner saw patient and deactivated code stroke and recommend MRI brain and treat for complex migraines.   10:40 PM CT head unremarkable. MRi showed no stroke. UA + UTI. Given rocephin. Urine culture sent. Headache resolved. Speech back to baseline per son. Will dc home with keflex.   Final Clinical Impressions(s) / ED Diagnoses   Final diagnoses:  None    New Prescriptions New Prescriptions   No medications on file     Charlynne Pander, MD 08/28/17 2243

## 2017-08-28 NOTE — ED Triage Notes (Signed)
Per EMS pt from home lives alone has had hx of stroke in the past with some left sided weakness but able to ambulate and live independently. Son stayed the night with mother and was LSN this morning at 7am. Patient wast noted to have slurred speech when family called her at 4:30pm. Pt notes that she has increased weakness in left side, and "my mouth felt funny" pt noted to be dysarthric, with bilateral lower extremity weakness.

## 2017-08-28 NOTE — Consult Note (Signed)
Neurology Consultation  Reason for Consult: Acute code stroke Referring Physician: Dr. Silverio Lay  CC: Slurred speech and left-sided weakness  History is obtained from:\Chart, patient, patient's son over the phone  HPI: [name] [name] is a 71 y.o. female was a past medical history of diabetes, hypertension, obesity, documented history of stroke with residual right-sided weakness but last neurology office visit suggesting no motor weakness, who has been seen at [facility] in the past for left-sided weakness and slurred speech and workup revealing no actual stroke, presents for evaluation of slurred speech and left-sided weakness. She was last known to be normal at 7 AM, when her grandson had witnessed her to be normal. Another family member called her at around 4:45 PM and noted that her speech sounded thick. EMS was called and the patient was brought in to [facility] because family was under the impression that she has had strokes in the past whereas prior imaging that I have reviewed in the chart has revealed evidence of possible right insular stroke many many years ago but the prior 3 visits have been for slurred speech and left-sided hemiparesis which has for most part been deemed to be compensated migraine. Patient says that she had discomfort on the right side, mainly in her chest, felt like she had some gas and did not make much of it until her son called and on the phone she did not sound right. Denies preceding fevers or chills.  Endorses nausea and vomiting. Denies shortness of breath or chest pain. Denies headaches. Denies visual changes or blurred vision. Denies history of easy bruising or bleeding  LKW: 0700 hrs tpa given?: no, outside the window Premorbid modified Rankin scale (mRS):1   ROS: A 14 point ROS was performed and is negative except as noted in the HPI.   Past Medical History:  Diagnosis Date  . Arthritis   . Bronchitis    hx  . Chest pain 2009   MV with no scar or  ischemia, EF 65%  . Complication of anesthesia    claustrophobic !  . Diabetes mellitus   . GERD (gastroesophageal reflux disease)   . History of cardiac catheterization    a. Myoview 2/15 with anteroapical ischemia, EF 64% (intermediate risk) >> LHC - normal cos, EF 55-60%  . Hypertension   . Neuromuscular disorder (HCC)    arotic Aneurysm  . Obesity (BMI 35.0-39.9 without comorbidity)   . Pneumonia   . Stroke [facility])    with some resid L sided weakness  . Thoracic ascending aortic aneurysm (HCC)    a. CT 1/17: ascending aorta 4.2 x 4.0 cm    Family History  Problem Relation Age of Onset  . Heart disease Father   . Pancreatitis Mother   . Diabetes Maternal Grandmother   . Heart disease Brother     Social History:   reports that she has never smoked. She has never used smokeless tobacco. She reports that she does not drink alcohol or use drugs.  Medications No current facility-administered medications for this encounter.   Current Outpatient Prescriptions:  .  acetaminophen (TYLENOL) 325 MG tablet, Take 2 tablets (650 mg total) by mouth every 4 (four) hours as needed for mild pain (or temp >/= 99.5 F)., Disp: 20 tablet, Rfl: 0 .  albuterol (PROVENTIL HFA;VENTOLIN HFA) 108 (90 BASE) MCG/ACT inhaler, Inhale 1-2 puffs into the lungs every 6 (six) hours as needed for wheezing., Disp: 1 Inhaler, Rfl: 0 .  aspirin 325 MG tablet,  Take 1 tablet (325 mg total) by mouth daily., Disp: 30 tablet, Rfl: 4 .  atorvastatin (LIPITOR) 10 MG tablet, Take 1 tablet (10 mg total) by mouth daily at 6 PM. (Patient not taking: Reported on 06/19/2017), Disp: 30 tablet, Rfl: 3 .  calcium carbonate (OSCAL) 1500 (600 Ca) MG TABS tablet, Take 1,500 mg by mouth 2 (two) times daily with a meal., Disp: , Rfl:  .  Cholecalciferol (VITAMIN D3) 1000 units CAPS, Take 1 capsule by mouth daily., Disp: , Rfl:  .  linagliptin (TRADJENTA) 5 MG TABS tablet, Take 5 mg by mouth daily., Disp: , Rfl:  .   lisinopril-hydrochlorothiazide (PRINZIDE,ZESTORETIC) 10-12.5 MG tablet, TAKE 1 TABLET BY MOUTH DAILY, Disp: 90 tablet, Rfl: 0 .  metFORMIN (GLUCOPHAGE-XR) 500 MG 24 hr tablet, Take 500 mg by mouth daily with supper., Disp: , Rfl: 3 .  metoprolol succinate (TOPROL-XL) 50 MG 24 hr tablet, TAKE 1 TABLET BY MOUTH DAILY WITH OR IMMEDIATELY FOLLOWING A MEAL, Disp: 15 tablet, Rfl: 0 .  omeprazole (PRILOSEC) 40 MG capsule, Take 40 mg by mouth daily., Disp: , Rfl:  .  potassium chloride SA (K-DUR,KLOR-CON) 20 MEQ tablet, Take 1 tablet (20 mEq total) by mouth daily. (Patient not taking: Reported on 06/19/2017), Disp: 30 tablet, Rfl: 11 .  spironolactone (ALDACTONE) 25 MG tablet, Take 12.5 mg by mouth daily., Disp: , Rfl:  .  triamcinolone ointment (KENALOG) 0.5 %, Apply 1 application topically 2 (two) times daily as needed for rash., Disp: , Rfl: 0   Exam: Current vital signs: Wt 83.7 kg (184 lb 8.4 oz)   BMI 32.69 kg/m  Vital signs in last 24 hours: Weight:  [83.7 kg (184 lb 8.4 oz)] 83.7 kg (184 lb 8.4 oz) (10/09 1800)  GENERAL: Awake, alert in NAD HEENT: - Normocephalic and atraumatic, dry mm, no LN++, no Thyromegally LUNGS - Clear to auscultation bilaterally with no wheezes CV - S1S2 RRR, no m/r/g, equal pulses bilaterally. ABDOMEN - Soft, nontender, nondistended with normoactive BS Ext: warm, well perfused, intact peripheral pulses,no edema  NEURO:  Mental Status: AA&Ox3  Language: speech is abnormal-slurred versus being spoken with sensation of full mouth-not sure qualifies as well as dysarthria..  Naming, repetition, fluency, and comprehension intact. Cranial Nerves: PERRL 105mm/brisk. EOMI, visual fields full, no facial asymmetry, facial sensation intact, hearing intact, tongue/uvula/soft palate midline, normal sternocleidomastoid and trapezius muscle strength. No evidence of tongue atrophy or fibrillations Motor: 5/5 on the right upper extremity. 4/5 on the left upper extremity. 3/5 in both  lower extremities with vertical drifts, with exiting the bed. Tone: is normal and bulk is normal Sensation- decreased sensation to all modalities on the left hemibody including splitting the tuning fork on the midline on the forehead. Sharp midline cutoff for other sensory modalities checked as well. Coordination: FTN intact bilaterally Gait- deferred  NIHSS 1a Level of Conscious.: 0 1b LOC Questions: 0 1c LOC Commands: 0 2 Best Gaze: 0 3 Visual: 0 4 Facial Palsy: 0 5a Motor Arm - left: 1 5b Motor Arm - Right: 0 6a Motor Leg - Left: 2 6b Motor Leg - Right: 2 7 Limb Ataxia: 0 8 Sensory: 2 9 Best Language: 0 10 Dysarthria: 1 11 Extinct. and Inatten.: 0 TOTAL: 8  Labs I have reviewed labs in epic and the results pertinent to this consultation are:   CBC    Component Value Date/Time   WBC 6.7 06/19/2017 0958   RBC 4.55 06/19/2017 0958   HGB 13.3 06/19/2017 0958  HCT 40.7 06/19/2017 0958   PLT 257 06/19/2017 0958   MCV 89.5 06/19/2017 0958   MCH 29.2 06/19/2017 0958   MCHC 32.7 06/19/2017 0958   RDW 14.4 06/19/2017 0958   LYMPHSABS 2.8 08/29/2016 0309   MONOABS 0.3 08/29/2016 0309   EOSABS 0.2 08/29/2016 0309   BASOSABS 0.0 08/29/2016 0309    CMP     Component Value Date/Time   NA 137 06/19/2017 0958   K 3.8 06/19/2017 0958   CL 102 06/19/2017 0958   CO2 26 06/19/2017 0958   GLUCOSE 228 (H) 06/19/2017 0958   BUN 13 06/19/2017 0958   CREATININE 0.99 06/19/2017 0958   CREATININE 0.90 12/02/2015 1330   CALCIUM 9.5 06/19/2017 0958   PROT 6.9 08/26/2016 1937   ALBUMIN 3.5 08/26/2016 1937   AST 20 08/26/2016 1937   ALT 13 (L) 08/26/2016 1937   ALKPHOS 78 08/26/2016 1937   BILITOT 0.3 08/26/2016 1937   GFRNONAA 56 (L) 06/19/2017 0958   GFRAA >60 06/19/2017 0958    Lipid Panel     Component Value Date/Time   CHOL 148 08/27/2016 0340   TRIG 72 08/27/2016 0340   HDL 38 (L) 08/27/2016 0340   CHOLHDL 3.9 08/27/2016 0340   VLDL 14 08/27/2016 0340   LDLCALC  96 08/27/2016 0340     Imaging I have reviewed the images obtained:  CT-scan of the brain Shows no acute abnormality.  Assessment:  71 y.o. female was a past medical history of diabetes, hypertension, obesity, documented history of stroke with residual right-sided weakness but last neurology office visit suggesting no motor weakness, who has been seen at [facility] in the past for left-sided weakness and slurred speech and workup revealing no acute strokes in the past, presents for evaluation of slurred speech and left-sided weakness. Her presentation and examination are very similar to the ones from before including functional overlay is on the exam on sensory findings and effort-dependent weakness is. She was outside of the window for TPA anyway at the time of presentation and has not a candidate for TPA. There was no evidence of large vessel occlusion based on the exam.  Impression: Complex migraine Evaluate for stroke Conversion disorder  Recommendations: I would recommend doing an MRI of the brain without contrast to look for any evidence of stroke. If that is negative, she does not require any stroke workup. She should follow with outpatient, [place] neurolog[facility], Dr. Roda Shutters.  She should only be admitted if the MRI shows a stroke-for stroke workup.  I would recommend a migraine cocktail for symptomatically treatment at this time.   -- Milon Dikes, MD [facility] [phone]  If 7pm to 7am, please call on call as listed on AMION.

## 2017-08-28 NOTE — Discharge Instructions (Signed)
Take keflex three times daily for UTI.   Take tylenol, motrin for headaches.   See your neurologist for follow up   Return to ER if you have worse headaches, slurred speech, trouble speaking, weakness.

## 2017-08-28 NOTE — ED Notes (Signed)
Pt transported to MRI on stretcher with tech. 

## 2017-08-28 NOTE — ED Notes (Signed)
Pt's CBG result was 166. Informed Elizabeth - RN.

## 2017-08-28 NOTE — ED Notes (Signed)
Pt returned from MRI on stretcher with tech, tolerated well. 

## 2017-08-30 LAB — URINE CULTURE

## 2017-12-25 NOTE — Progress Notes (Signed)
Cardiology Office Note:    Date:  12/26/2017   ID:  [name] Apolinar, DOB 09-24-1946, MRN [id number]  PCP:  Verlon Au, MD  Cardiologist:  Donato Schultz, MD   Referring MD: Verlon Au, MD   Chief Complaint  Patient presents with  . Follow-up    hypertension, thoracic aneurysm    History of Present Illness:    [place] Eyer is a 72 y.o. female with a past medical history significant for diabetes type 2, hypertension, hyperlipidemia, obesity,  ascending thoracic aortic aneurysm.    The patient was evaluated for chest pain in 12/2013 with an abnormal nuclear stress test followed by a left heart cath that demonstrated normal coronary arteries.  In 11/2015 the patient was seen in the emergency department for evaluation of chest pain.  A CT scan demonstrated possible right lower lobe pneumonia and incidentally found mild dilatation of the ascending measuring 4.2 x 4 cm.    The patient was seen in the ED on 08/28/17 for possible stroke.  She was found to have CT and MRI negative for stroke and she was treated for UTI.  The patient was seen in the emergency department on 06/19/17 for complaints of chest pain and ruled out for MI.  The patient had a CTA of the chest on 06/19/2017 that showed stable mild  ascending aorta dilatation at 4.1 cm.  The patient was last seen in the office by Tereso Newcomer, PA on 12/02/2015.  At that time the patient was noted to have chronic chest pain, pleuritic in nature.  She also noted chronic dyspnea on exertion and 3 pillow orthopnea.  She had no significant edema.  An echocardiogram was ordered and done on 08/28/2016 which revealed mild LVH, EF 60-65%, grade 1 diastolic dysfunction, aortic root normal in size, mild MR. A BNP at the time was 36.8.   The patient is here today for follow-up with daughter.   Occasional tightness in the chest, tingling "like a toothache" with exertion and sometimes when sitting. She sits still and it goes away. Had one  episode of what she thought was gas so she rocked back and forth and walked and it finally eased off after about 2 hours. This is all similar to what she has been having for several years. Only rare dizziness, has a history of vertigo. No syncope. No recent stroke like symptoms. No orthopnea, PND or edema.    Past Medical History:  Diagnosis Date  . Arthritis   . Bronchitis    hx  . Chest pain 2009   MV with no scar or ischemia, EF 65%  . Complication of anesthesia    claustrophobic !  . Diabetes mellitus   . GERD (gastroesophageal reflux disease)   . History of cardiac catheterization    a. Myoview 2/15 with anteroapical ischemia, EF 64% (intermediate risk) >> LHC - normal cos, EF 55-60%  . Hypertension   . Neuromuscular disorder (HCC)    arotic Aneurysm  . Obesity (BMI 35.0-39.9 without comorbidity)   . Pneumonia   . Stroke [facility])    with some resid L sided weakness  . Thoracic ascending aortic aneurysm (HCC)    a. CT 1/17: ascending aorta 4.2 x 4.0 cm    Past Surgical History:  Procedure Laterality Date  . ABDOMINAL HYSTERECTOMY  2006   Partial, one ovary left   . CARDIAC CATHETERIZATION  2004   [place], [place] per pt.  . CHOLECYSTECTOMY    . EAR  CYST EXCISION Left 09/20/2016   Procedure: EXCISION  LEFT EAR LESION;  Surgeon: Newman Pies, MD;  Location: [facility] OR;  Service: ENT;  Laterality: Left;  . LEFT HEART CATHETERIZATION WITH CORONARY ANGIOGRAM N/A 01/09/2014   Procedure: LEFT HEART CATHETERIZATION WITH CORONARY ANGIOGRAM;  Surgeon: Marykay Lex, MD;  Location: [facility] CATH LAB;  Service: Cardiovascular;  Laterality: N/A;  . SKIN SPLIT GRAFT Left 09/20/2016   Procedure: SKIN GRAFT SPLIT THICKNESS;  Surgeon: Newman Pies, MD;  Location: [facility] OR;  Service: ENT;  Laterality: Left;  . TEMPOROMANDIBULAR JOINT SURGERY    . TONSILLECTOMY    . WISDOM TOOTH EXTRACTION      Current Medications: Current Meds  Medication Sig  . albuterol (PROVENTIL HFA;VENTOLIN HFA) 108 (90 BASE) MCG/ACT  inhaler Inhale 1-2 puffs into the lungs every 6 (six) hours as needed for wheezing.  Marland Kitchen aspirin 325 MG tablet Take 1 tablet (325 mg total) by mouth daily.  Marland Kitchen atorvastatin (LIPITOR) 10 MG tablet Take 1 tablet (10 mg total) by mouth daily at 6 PM.  . calcium carbonate (OSCAL) 1500 (600 Ca) MG TABS tablet Take 1,500 mg by mouth 2 (two) times daily with a meal.  . cephALEXin (KEFLEX) 500 MG capsule Take 1 capsule (500 mg total) by mouth 3 (three) times daily.  . Cholecalciferol (VITAMIN D3) 1000 units CAPS Take 1 capsule by mouth daily.  Marland Kitchen linagliptin (TRADJENTA) 5 MG TABS tablet Take 5 mg by mouth daily.  Marland Kitchen lisinopril-hydrochlorothiazide (PRINZIDE,ZESTORETIC) 10-12.5 MG tablet TAKE 1 TABLET BY MOUTH DAILY  . metFORMIN (GLUCOPHAGE-XR) 500 MG 24 hr tablet Take 500 mg by mouth daily with supper.  . metoprolol succinate (TOPROL-XL) 50 MG 24 hr tablet TAKE 1 TABLET BY MOUTH DAILY WITH OR IMMEDIATELY FOLLOWING A MEAL  . omeprazole (PRILOSEC) 40 MG capsule Take 40 mg by mouth daily.  Marland Kitchen spironolactone (ALDACTONE) 25 MG tablet Take 12.5 mg by mouth daily.  Marland Kitchen triamcinolone ointment (KENALOG) 0.5 % Apply 1 application topically 2 (two) times daily as needed for rash.     Allergies:   Adhesive [tape]; Codeine; and Empagliflozin   Social History   Socioeconomic History  . Marital status: Married    Spouse name: None  . Number of children: None  . Years of education: None  . Highest education level: None  Social Needs  . Financial resource strain: None  . Food insecurity - worry: None  . Food insecurity - inability: None  . Transportation needs - medical: None  . Transportation needs - non-medical: None  Occupational History  . Occupation: Engineer, structural for the elderly    Employer: RETIRED  Tobacco Use  . Smoking status: Never Smoker  . Smokeless tobacco: Never Used  Substance and Sexual Activity  . Alcohol use: No  . Drug use: No  . Sexual activity: No    Birth control/protection: Post-menopausal   Other Topics Concern  . None  Social History Narrative   Family History Details: Her mother died at age 87 of pancreatitis. Her father died of mitral valve disease at age 57. She has a brother who has had myocardial infarction and also has had bypass surgery and a sister who    has a pacemaker.           Family History: The patient's family history includes CAD in her son; Diabetes in her maternal grandmother; Heart disease in her brother and father; Kidney disease in her son; Pancreatitis in her mother; Valvular heart disease in her brother and father.  ROS:   Please see the history of present illness.     All other systems reviewed and are negative.  EKGs/Labs/Other Studies Reviewed:    The following studies were reviewed today:  CTA chest 06/19/2017 Cardiovascular: No evidence of aortic dissection or intramural hematoma. Maximal diameter of the ascending aorta is 4.1 cm compared with 4.1 cm on the prior study based on my direct measurements. Mild atherosclerotic calcification and plaque in the aortic arch. Great vessels are patent within the confines of the examination. There is no obvious evidence of acute pulmonary thromboembolism.  Echo 08/28/2016 Study Conclusions - Left ventricle: The cavity size was normal. There was mild   concentric hypertrophy. Systolic function was normal. The   estimated ejection fraction was in the range of 60% to 65%. Wall   motion was normal; there were no regional wall motion   abnormalities. Doppler parameters are consistent with abnormal   left ventricular relaxation (grade 1 diastolic dysfunction).   Doppler parameters are consistent with elevated ventricular   end-diastolic filling pressure. - Aortic valve: Trileaflet; normal thickness leaflets. There was no   regurgitation. - Aortic root: The aortic root was normal in size. - Mitral valve: Structurally normal valve. There was mild   regurgitation. - Left atrium: The atrium was normal in  size. - Right ventricle: The cavity size was normal. Wall thickness was   normal. Systolic function was normal. - Right atrium: The atrium was normal in size. - Tricuspid valve: There was mild regurgitation. - Pulmonic valve: There was no regurgitation. - Pulmonary arteries: Systolic pressure was mildly increased. PA   peak pressure: 37 mm Hg (S). - Inferior vena cava: The vessel was normal in size. - Pericardium, extracardiac: There was no pericardial effusion.  Chest CTA 11/25/15 IMPRESSION: No demonstrable pulmonary embolus. Patchy bibasilar atelectasis with mild consolidation posterior right base. Prominence of the ascending thoracic aorta measuring 4.2 x 4.0 cm. Recommend annual imaging followup by CTA or MRA. This recommendation follows 2010 ACCF/AHA/AATS/ACR/ASA/SCA/SCAI/SIR/STS/SVM Guidelines for the Diagnosis and Management of Patients with Thoracic Aortic Disease. Circulation. 2010; 121: [id number] No demonstrable adenopathy. Gallbladder absent. Asymmetric density midportion right breast compared to the left.  Significance of this finding is uncertain. This finding may warrant mammographic correlation if mammography has not been performed Recently.  LHC 01/09/14 1. Left main: This was essentially a common ostium which trifurcated into an LAD, a ramus intermediate vessel, and left circumflex coronary artery  2. LAD: Angiographically normal 3. Ramus Intermediate: Angiographically normal 4. Left circumflex: Angiographically normal.  5. Right coronary artery: Angiographically normal dominant right coronary artery 6. EF 55-60%  Myoview 01/08/14 Intermediate risk study. Partially reversible mid to distal anteroapical defect (SDS 5), which could represent ischemia or less likely shifting breast artifact. Additional fixed inferior and lateral attenuation artifact. LV EF 64% with no wall motion abnormalities. Chest pressure and left arm heaviness was noted with the administration of  Lexiscan and subtle anterolateral ST depression and T-wave inversions were noted as well. Clinical correlation is advised.  Echo 04/2012 Mild LVH, EF 50-55%, RWMA cannot be excluded, grade 2 diastolic dysfunction, mild MR, mild LAE, PASP 37 mmHg   Carotid US 04/2012 No significant ICA stenosis  EKG:  EKG is ordered today.  The ekg ordered today demonstrates NSR with LAFB, no change from previous. QTC 484  Recent Labs: 08/28/2017: ALT 12; BUN 17; Creatinine, Ser 1.10; Hemoglobin 14.3; Platelets 240; Potassium 3.6; Sodium 140   Recent Lipid Panel    Component Value Date/Time  CHOL 148 08/27/2016 0340   TRIG 72 08/27/2016 0340   HDL 38 (L) 08/27/2016 0340   CHOLHDL 3.9 08/27/2016 0340   VLDL 14 08/27/2016 0340   LDLCALC 96 08/27/2016 0340    Physical Exam:    VS:  BP 118/72   Pulse 68   Ht 5\' 3"  (1.6 m)   Wt 178 lb 12.8 oz (81.1 kg)   SpO2 94%   BMI 31.67 kg/m     Wt Readings from Last 3 Encounters:  12/26/17 178 lb 12.8 oz (81.1 kg)  08/28/17 184 lb 8.4 oz (83.7 kg)  10/30/16 194 lb (88 kg)     Physical Exam  Constitutional: She is oriented to person, place, and time. She appears well-developed and well-nourished. No distress.  HENT:  Head: Normocephalic and atraumatic.  Neck: Normal range of motion. Neck supple. No JVD present.  Cardiovascular: Normal rate, regular rhythm, normal heart sounds and intact distal pulses. Exam reveals no gallop and no friction rub.  No murmur heard. Pulmonary/Chest: Effort normal and breath sounds normal. No respiratory distress. She has no wheezes. She has no rales.  Abdominal: Soft. Bowel sounds are normal. She exhibits no distension. There is no tenderness.  Musculoskeletal: Normal range of motion. She exhibits no edema or deformity.  Neurological: She is alert and oriented to person, place, and time.  Skin: Skin is warm and dry.  Psychiatric: She has a normal mood and affect. Her behavior is normal. Thought content normal.     ASSESSMENT:    1. Thoracic aortic aneurysm without rupture (HCC)   2. Chronic diastolic heart failure, NYHA class 2 (HCC)   3. Essential hypertension    PLAN:    In order of problems listed above:  Ascending thoracic aortic aneurysm-mild dilatation of the ascending aorta first noted on chest CT in 2017, 4.1 cm.  CTA of the chest done in 05/2017 demonstrated stable ascending aortic dilatation at 4.1 cm.  Recommendations are for annual CT imaging. Pt has family history of aortic aneurysm in father, brother and possibly son. Plan for imaging at next appointment in 6 months.   Diastolic dysfunction: mild DOE with walking uphill. Doesn't use stairs. No problem with shorter distances or walking in the house. No orthopnea or PND. No edema. Lungs clear. Wt is actually down a little.   History of stroke: on aspirin and statin. Pt on full dose aspirin. Advised to check with her neurologist or PCP to see if this needs to be continued.   Hypertension: Toprol XL 50 mg, lisinopril -HCTZ 10-12.5 mg, spironolactone 25 mg.   BP well controlled. Continue current therapy.  Pt on 2 potassium sparining meds, low K+ in the past. Will check BMet today for potassium and renal function.   Chronic chest pain: normal coronary arteries on cardiac cath in 12/2013. Still having occ vague chest discomfort, similar to what she has been having. No new concerning symptoms.   Type 2 diabetes: On metformin. Managed by PCP. Last A1c in Epic was 8.4. Pt states metformin was increased recently.   Medication Adjustments/Labs and Tests Ordered: Current medicines are reviewed at length with the patient today.  Concerns regarding medicines are outlined above. Labs and tests ordered and medication changes are outlined in the patient instructions below:  Patient Instructions  Medication Instructions:  Your physician recommends that you continue on your current medications as directed. Please refer to the Current Medication list  given to you today.  DISCUSS WITH YOUR NEUROLOGIST OR PRIMARY CARE  TO SEE IF YOU STILL NEED TO BE ASPIRIN 325 MG OR IF OK TO DECREASE TO 81 MG DAILY  Labwork: TODAY BMET  Testing/Procedures: NONE ORDERED TODAY  Follow-Up: Your physician wants you to follow-up in: 6 MONTHS WITH DR. Anne Fu  You will receive a reminder letter in the mail two months in advance. If you don't receive a letter, please call our office to schedule the follow-up appointment.   Any Other Special Instructions Will Be Listed Below (If Applicable).     If you need a refill on your cardiac medications before your next appointment, please call your pharmacy.      Signed, Berton Bon, NP  12/26/2017 4:40 PM    [facility]

## 2017-12-26 ENCOUNTER — Encounter: Payer: Self-pay | Admitting: Physician Assistant

## 2017-12-26 ENCOUNTER — Ambulatory Visit: Payer: Medicare Other | Admitting: Cardiology

## 2017-12-26 VITALS — BP 118/72 | HR 68 | Ht 63.0 in | Wt 178.8 lb

## 2017-12-26 DIAGNOSIS — E119 Type 2 diabetes mellitus without complications: Secondary | ICD-10-CM

## 2017-12-26 DIAGNOSIS — I10 Essential (primary) hypertension: Secondary | ICD-10-CM

## 2017-12-26 DIAGNOSIS — I5032 Chronic diastolic (congestive) heart failure: Secondary | ICD-10-CM

## 2017-12-26 DIAGNOSIS — I712 Thoracic aortic aneurysm, without rupture, unspecified: Secondary | ICD-10-CM

## 2017-12-26 NOTE — Patient Instructions (Signed)
Medication Instructions:  Your physician recommends that you continue on your current medications as directed. Please refer to the Current Medication list given to you today.  DISCUSS WITH YOUR NEUROLOGIST OR PRIMARY CARE TO SEE IF YOU STILL NEED TO BE ASPIRIN 325 MG OR IF OK TO DECREASE TO 81 MG DAILY  Labwork: TODAY BMET  Testing/Procedures: NONE ORDERED TODAY  Follow-Up: Your physician wants you to follow-up in: 6 MONTHS WITH DR. Anne FuSKAINS  You will receive a reminder letter in the mail two months in advance. If you don't receive a letter, please call our office to schedule the follow-up appointment.   Any Other Special Instructions Will Be Listed Below (If Applicable).     If you need a refill on your cardiac medications before your next appointment, please call your pharmacy.

## 2017-12-27 LAB — BASIC METABOLIC PANEL
BUN/Creatinine Ratio: 21 (ref 12–28)
BUN: 20 mg/dL (ref 8–27)
CALCIUM: 10.1 mg/dL (ref 8.7–10.3)
CO2: 28 mmol/L (ref 20–29)
Chloride: 100 mmol/L (ref 96–106)
Creatinine, Ser: 0.94 mg/dL (ref 0.57–1.00)
GFR, EST AFRICAN AMERICAN: 70 mL/min/{1.73_m2} (ref >59)
GFR, EST NON AFRICAN AMERICAN: 61 mL/min/{1.73_m2} (ref >59)
Glucose: 166 mg/dL — ABNORMAL HIGH (ref 65–99)
Potassium: 4.5 mmol/L (ref 3.5–5.2)
Sodium: 143 mmol/L (ref 134–144)

## 2018-01-21 ENCOUNTER — Encounter (HOSPITAL_COMMUNITY): Payer: Self-pay

## 2018-01-21 ENCOUNTER — Ambulatory Visit (HOSPITAL_COMMUNITY)
Admission: EM | Admit: 2018-01-21 | Discharge: 2018-01-21 | Disposition: A | Discharge status: Nursing Home (Includes ICF) | Payer: Medicare Other | Source: Home / Self Care

## 2018-01-21 ENCOUNTER — Emergency Department (HOSPITAL_COMMUNITY)
Admission: EM | Admit: 2018-01-21 | Discharge: 2018-01-21 | Disposition: A | Discharge status: Home / Self Care | Payer: Medicare Other | Attending: Emergency Medicine | Admitting: Emergency Medicine

## 2018-01-21 ENCOUNTER — Emergency Department (HOSPITAL_COMMUNITY): Payer: Medicare Other

## 2018-01-21 ENCOUNTER — Other Ambulatory Visit: Payer: Self-pay

## 2018-01-21 DIAGNOSIS — Z8673 Personal history of transient ischemic attack (TIA), and cerebral infarction without residual deficits: Secondary | ICD-10-CM | POA: Insufficient documentation

## 2018-01-21 DIAGNOSIS — R0602 Shortness of breath: Secondary | ICD-10-CM | POA: Insufficient documentation

## 2018-01-21 DIAGNOSIS — Z7984 Long term (current) use of oral hypoglycemic drugs: Secondary | ICD-10-CM | POA: Diagnosis not present

## 2018-01-21 DIAGNOSIS — I5032 Chronic diastolic (congestive) heart failure: Secondary | ICD-10-CM | POA: Insufficient documentation

## 2018-01-21 DIAGNOSIS — Z7982 Long term (current) use of aspirin: Secondary | ICD-10-CM | POA: Insufficient documentation

## 2018-01-21 DIAGNOSIS — Z79899 Other long term (current) drug therapy: Secondary | ICD-10-CM | POA: Insufficient documentation

## 2018-01-21 DIAGNOSIS — R0789 Other chest pain: Secondary | ICD-10-CM | POA: Insufficient documentation

## 2018-01-21 DIAGNOSIS — R05 Cough: Secondary | ICD-10-CM | POA: Insufficient documentation

## 2018-01-21 DIAGNOSIS — I110 Hypertensive heart disease with heart failure: Secondary | ICD-10-CM | POA: Insufficient documentation

## 2018-01-21 DIAGNOSIS — E119 Type 2 diabetes mellitus without complications: Secondary | ICD-10-CM | POA: Insufficient documentation

## 2018-01-21 LAB — BASIC METABOLIC PANEL
ANION GAP: 12 (ref 5–15)
BUN: 17 mg/dL (ref 6–20)
CHLORIDE: 103 mmol/L (ref 101–111)
CO2: 24 mmol/L (ref 22–32)
Calcium: 9.1 mg/dL (ref 8.9–10.3)
Creatinine, Ser: 1.03 mg/dL — ABNORMAL HIGH (ref 0.44–1.00)
GFR calc non Af Amer: 53 mL/min — ABNORMAL LOW (ref >60)
Glucose, Bld: 122 mg/dL — ABNORMAL HIGH (ref 65–99)
Potassium: 3.3 mmol/L — ABNORMAL LOW (ref 3.5–5.1)
SODIUM: 139 mmol/L (ref 135–145)

## 2018-01-21 LAB — CBC
HCT: 41 % (ref 36.0–46.0)
HEMOGLOBIN: 13.2 g/dL (ref 12.0–15.0)
MCH: 29.9 pg (ref 26.0–34.0)
MCHC: 32.2 g/dL (ref 30.0–36.0)
MCV: 92.8 fL (ref 78.0–100.0)
Platelets: 257 10*3/uL (ref 150–400)
RBC: 4.42 MIL/uL (ref 3.87–5.11)
RDW: 14.7 % (ref 11.5–15.5)
WBC: 7 10*3/uL (ref 4.0–10.5)

## 2018-01-21 LAB — I-STAT TROPONIN, ED
TROPONIN I, POC: 0 ng/mL (ref 0.00–0.08)
Troponin i, poc: 0 ng/mL (ref 0.00–0.08)

## 2018-01-21 NOTE — ED Provider Notes (Signed)
[facility] EMERGENCY DEPARTMENT Provider Note   CSN: [id number] Arrival date & time: 01/21/18  1825     History   Chief Complaint Chief Complaint  Patient presents with  . Chest Pain  . Shortness of Breath    HPI [name] [name] is a 72 y.o. female.  25 yo F with a chief complaint of chest pain.  This started today.  Worse with sitting up right.  Worse with deep breaths.  Better with laying back flat.  Having a mild cough.  Denies fevers or chills.  Mild shortness of breath secondary to the pain.  Denies lower extremity edema denies hemoptysis denies history of PE or DVT.  Denies smoking history.  Has a history of hypertension denies hyperlipidemia diabetes.  Had a family history of a brother had an MI in his 29s.  Denies recent surgery denies prolonged travel denies recent hospitalization denies estrogen use.   The history is provided by the patient.  Chest Pain   This is a new problem. The current episode started 3 to 5 hours ago. The problem occurs constantly. The problem has not changed since onset.The pain is associated with movement. The pain is present in the lateral region. The pain is at a severity of 7/10. The pain is moderate. The quality of the pain is described as brief. The pain does not radiate. Duration of episode(s) is 2 seconds. The symptoms are aggravated by certain positions. Associated symptoms include shortness of breath. Pertinent negatives include no dizziness, no fever, no headaches, no nausea, no palpitations and no vomiting. She has tried nothing for the symptoms. The treatment provided no relief.  Her past medical history is significant for hypertension.  Pertinent negatives for past medical history include no diabetes, no DVT, no hyperlipidemia, no MI and no PE.  Her family medical history is significant for early MI (40's brother).  Shortness of Breath  Associated symptoms include chest pain. Pertinent negatives include no fever, no  headaches, no rhinorrhea, no wheezing and no vomiting. Associated medical issues do not include PE, past MI or DVT.    Past Medical History:  Diagnosis Date  . Arthritis   . Bronchitis    hx  . Chest pain 2009   MV with no scar or ischemia, EF 65%  . Complication of anesthesia    claustrophobic !  . Diabetes mellitus   . GERD (gastroesophageal reflux disease)   . History of cardiac catheterization    a. Myoview 2/15 with anteroapical ischemia, EF 64% (intermediate risk) >> LHC - normal cos, EF 55-60%  . Hypertension   . Neuromuscular disorder (HCC)    arotic Aneurysm  . Obesity (BMI 35.0-39.9 without comorbidity)   . Pneumonia   . Stroke [facility])    with some resid L sided weakness  . Thoracic ascending aortic aneurysm (HCC)    a. CT 1/17: ascending aorta 4.2 x 4.0 cm    Patient Active Problem List   Diagnosis Date Noted  . Chronic sinusitis   . Complicated migraine   . Pure hypercholesterolemia   . TIA (transient ischemic attack)   . Atypical chest pain   . Cerebral infarction (HCC) 08/26/2016  . Chronic diastolic heart failure, NYHA class 2 (HCC) 12/07/2015  . Pulmonary HTN (HCC) 12/07/2015  . Chronic chest pain 12/07/2015  . Thoracic aortic aneurysm (HCC) 12/07/2015  . Left sided numbness 12/07/2015  . Bradycardia 05/06/2012  . CVA (cerebral vascular accident) (HCC) 05/05/2012  . Obesity due to  excess calories 04/25/2012  . Cystocele 03/22/2012  . Type 2 diabetes mellitus without complication, without long-term current use of insulin (HCC) 03/22/2012  . Essential hypertension 03/22/2012  . gerd 03/22/2012    Past Surgical History:  Procedure Laterality Date  . ABDOMINAL HYSTERECTOMY  2006   Partial, one ovary left   . CARDIAC CATHETERIZATION  2004   [place] per pt.  . CHOLECYSTECTOMY    . EAR CYST EXCISION Left 09/20/2016   Procedure: EXCISION  LEFT EAR LESION;  Surgeon: Newman Pies, MD;  Location: [facility] OR;  Service: ENT;  Laterality: Left;  . LEFT  HEART CATHETERIZATION WITH CORONARY ANGIOGRAM N/A 01/09/2014   Procedure: LEFT HEART CATHETERIZATION WITH CORONARY ANGIOGRAM;  Surgeon: Marykay Lex, MD;  Location: [facility] CATH LAB;  Service: Cardiovascular;  Laterality: N/A;  . SKIN SPLIT GRAFT Left 09/20/2016   Procedure: SKIN GRAFT SPLIT THICKNESS;  Surgeon: Newman Pies, MD;  Location: [facility] OR;  Service: ENT;  Laterality: Left;  . TEMPOROMANDIBULAR JOINT SURGERY    . TONSILLECTOMY    . WISDOM TOOTH EXTRACTION      OB History    Gravida Para Term Preterm AB Living   4 4 4  0 0 4   SAB TAB Ectopic Multiple Live Births   0 0 0 0        Obstetric Comments   Delivered 4 large babies vaginally       Home Medications    Prior to Admission medications   Medication Sig Start Date End Date Taking? Authorizing Provider  albuterol (PROVENTIL HFA;VENTOLIN HFA) 108 (90 BASE) MCG/ACT inhaler Inhale 1-2 puffs into the lungs every 6 (six) hours as needed for wheezing. 07/29/13   Reuben Likes, MD  aspirin 325 MG tablet Take 1 tablet (325 mg total) by mouth daily. 08/29/16   Lenox Ponds, MD  atorvastatin (LIPITOR) 10 MG tablet Take 1 tablet (10 mg total) by mouth daily at 6 PM. 12/09/15   Rai, Delene Ruffini, MD  calcium carbonate (OSCAL) 1500 (600 Ca) MG TABS tablet Take 1,500 mg by mouth 2 (two) times daily with a meal.    [provider]  cephALEXin (KEFLEX) 500 MG capsule Take 1 capsule (500 mg total) by mouth 3 (three) times daily. 08/28/17   Charlynne Pander, MD  Cholecalciferol (VITAMIN D3) 1000 units CAPS Take 1 capsule by mouth daily.    [provider]  linagliptin (TRADJENTA) 5 MG TABS tablet Take 5 mg by mouth daily.    [provider]  lisinopril-hydrochlorothiazide (PRINZIDE,ZESTORETIC) 10-12.5 MG tablet TAKE 1 TABLET BY MOUTH DAILY 12/27/15   Nicholaus Bloom C, MD  metFORMIN (GLUCOPHAGE-XR) 500 MG 24 hr tablet Take 500 mg by mouth daily with supper. 04/23/17   [provider]  metoprolol succinate (TOPROL-XL) 50  MG 24 hr tablet TAKE 1 TABLET BY MOUTH DAILY WITH OR IMMEDIATELY FOLLOWING A MEAL 07/16/17   Tereso Newcomer T, PA-C  omeprazole (PRILOSEC) 40 MG capsule Take 40 mg by mouth daily.    [provider]  spironolactone (ALDACTONE) 25 MG tablet Take 12.5 mg by mouth daily.    [provider]  triamcinolone ointment (KENALOG) 0.5 % Apply 1 application topically 2 (two) times daily as needed for rash. 04/23/17   [provider]    Family History Family History  Problem Relation Age of Onset  . Heart disease Father   . Valvular heart disease Father        aortic valve replacement  .  Pancreatitis Mother   . Diabetes Maternal Grandmother   . Heart disease Brother   . Valvular heart disease Brother        aortic valve replacement  . CAD Son        heart attack  . Kidney disease Son     Social History Social History   Tobacco Use  . Smoking status: Never Smoker  . Smokeless tobacco: Never Used  Substance Use Topics  . Alcohol use: No  . Drug use: No     Allergies   Adhesive [tape]; Codeine; and Empagliflozin   Review of Systems Review of Systems  Constitutional: Negative for chills and fever.  HENT: Negative for congestion and rhinorrhea.   Eyes: Negative for redness and visual disturbance.  Respiratory: Positive for shortness of breath. Negative for wheezing.   Cardiovascular: Positive for chest pain. Negative for palpitations.  Gastrointestinal: Negative for nausea and vomiting.  Genitourinary: Negative for dysuria and urgency.  Musculoskeletal: Negative for arthralgias and myalgias.  Skin: Negative for pallor and wound.  Neurological: Negative for dizziness and headaches.     Physical Exam Updated Vital Signs BP (!) 153/91   Pulse 66   Temp 98 F (36.7 C) (Oral)   Resp (!) 24   SpO2 99%   Physical Exam  Constitutional: She is oriented to person, place, and time. She appears well-developed and well-nourished. No distress.  HENT:  Head:  Normocephalic and atraumatic.  Eyes: EOM are normal. Pupils are equal, round, and reactive to light.  Neck: Normal range of motion. Neck supple.  Cardiovascular: Normal rate and regular rhythm. Exam reveals no gallop and no friction rub.  No murmur heard. Pulmonary/Chest: Effort normal. She has no wheezes. She has no rales.    Abdominal: Soft. She exhibits no distension. There is no tenderness.  Musculoskeletal: She exhibits no edema or tenderness.  Neurological: She is alert and oriented to person, place, and time.  Skin: Skin is warm and dry. She is not diaphoretic.  Psychiatric: She has a normal mood and affect. Her behavior is normal.  Nursing note and vitals reviewed.    ED Treatments / Results  Labs (all labs ordered are listed, but only abnormal results are displayed) Labs Reviewed  BASIC METABOLIC PANEL - Abnormal; Notable for the following components:      Result Value   Potassium 3.3 (*)    Glucose, Bld 122 (*)    Creatinine, Ser 1.03 (*)    GFR calc non Af Amer 53 (*)    All other components within normal limits  CBC  I-STAT TROPONIN, ED  I-STAT TROPONIN, ED    EKG  EKG Interpretation  Date/Time:  Monday January 21 2018 18:34:42 EST Ventricular Rate:  75 PR Interval:  156 QRS Duration: 88 QT Interval:  206 QTC Calculation: 230 R Axis:   -26 Text Interpretation:  Normal sinus rhythm Nonspecific ST and T wave abnormality Abnormal ECG No significant change since last tracing Confirmed by Melene Plan [phone]) on 01/21/2018 10:25:37 PM       Radiology Dg Chest 2 View  Result Date: 01/21/2018 CLINICAL DATA:  Mid to left-sided chest pain x1 week. EXAM: CHEST  2 VIEW COMPARISON:  08/28/2017 FINDINGS: The heart size and mediastinal contours are within normal limits. Tortuous, slightly atherosclerotic and ectatic thoracic aorta. Both lungs are clear. The visualized skeletal structures are unremarkable. IMPRESSION: No active cardiopulmonary disease. Electronically Signed    By: Tollie Eth M.D.   On: 01/21/2018 19:24  Procedures Procedures (including critical care time)  Medications Ordered in ED Medications - No data to display   Initial Impression / Assessment and Plan / ED Course  I have reviewed the triage vital signs and the nursing notes.  Pertinent labs & imaging results that were available during my care of the patient were reviewed by me and considered in my medical decision making (see chart for details).     72 yo F with a chief complaint of chest pain.  This is reproduced on exam.  Her initial troponin is negative.  Will repeat.  EKG unremarkable chest x-ray reviewed by me without pneumonia. Delta negative.  D/c home.   11:10 PM:  I have discussed the diagnosis/risks/treatment options with the patient and believe the pt to be eligible for discharge home to follow-up with PCP. We also discussed returning to the ED immediately if new or worsening sx occur. We discussed the sx which are most concerning (e.g., sudden worsening pain, fever, inability to tolerate by mouth) that necessitate immediate return. Medications administered to the patient during their visit and any new prescriptions provided to the patient are listed below.  Medications given during this visit Medications - No data to display   The patient appears reasonably screen and/or stabilized for discharge and I doubt any other medical condition or other [facility] requiring further screening, evaluation, or treatment in the ED at this time prior to discharge.    Final Clinical Impressions(s) / ED Diagnoses   Final diagnoses:  Atypical chest pain    ED Discharge Orders    None       Melene PlanFloyd, Francyne Arreaga, DO 01/21/18 2310

## 2018-01-21 NOTE — Discharge Instructions (Signed)
Take tylenol 1000mg(2 extra strength) four times a day.  ° °

## 2018-01-21 NOTE — ED Triage Notes (Signed)
Pt reports tightness in central chest with sob worse with exertion and when laying flat x 1 week. Endorses nonproductive cough. Denies edema

## 2018-01-21 NOTE — ED Notes (Signed)
[name] BambergMario Mani, [place] aware.  Pt here for central chest pain with associated nausea x1 week.  Pt has history of thoracic aneurism.  Pt was agreeable to go to ED for further evaluation.

## 2018-06-28 ENCOUNTER — Ambulatory Visit: Payer: Medicare Other | Admitting: Cardiology

## 2018-06-28 ENCOUNTER — Encounter: Payer: Self-pay | Admitting: Cardiology

## 2018-06-28 ENCOUNTER — Encounter (INDEPENDENT_AMBULATORY_CARE_PROVIDER_SITE_OTHER): Payer: Self-pay

## 2018-06-28 VITALS — BP 122/74 | HR 60 | Ht 63.0 in | Wt 185.4 lb

## 2018-06-28 DIAGNOSIS — I10 Essential (primary) hypertension: Secondary | ICD-10-CM | POA: Diagnosis not present

## 2018-06-28 DIAGNOSIS — E119 Type 2 diabetes mellitus without complications: Secondary | ICD-10-CM | POA: Diagnosis not present

## 2018-06-28 DIAGNOSIS — I712 Thoracic aortic aneurysm, without rupture, unspecified: Secondary | ICD-10-CM

## 2018-06-28 NOTE — Patient Instructions (Signed)
Medication Instructions:   Your physician recommends that you continue on your current medications as directed. Please refer to the Current Medication list given to you today.  If you need a refill on your cardiac medications before your next appointment, please call your pharmacy.  Labwork: NONE ORDERED  TODAY    Testing/Procedures: .Your physician has requested that you have an echocardiogram. Echocardiography is a painless test that uses sound waves to create images of your heart. It provides your doctor with information about the size and shape of your heart and how well your heart's chambers and valves are working. This procedure takes approximately one hour. There are no restrictions for this procedure.   Follow-Up: Your physician wants you to follow-up in: ONE YEAR WITH SKAINS  You will receive a reminder letter in the mail two months in advance. If you don't receive a letter, please call our office to schedule the follow-up appointment.     Any Other Special Instructions Will Be Listed Below (If Applicable).

## 2018-06-28 NOTE — Progress Notes (Signed)
Cardiology Office Note:    Date:  06/28/2018   ID:  [name] [name], DOB 1946-03-09, MRN [id number]  PCP:  Verlon Au, MD  Cardiologist:  Donato Schultz, MD   Referring MD: Verlon Au, MD     History of Present Illness:    [name] [name] is a 72 y.o. female here for evaluation of chest pain and shortness of breath. Was seen in ER on 01/21/18. Pain at that time was worse with sitting up, worse with deep breaths, better laying flat. No fevers, no PE.   Sharp pain, intermittent, 9/10. Like a shock. Quick off. Hold breath and goes away. Same frequency. Can be when active so sitting.  She also mentions that she has some balance issues at times.  Feels unsteady.  Knows to be careful.  She does have a cane at home.  She has had one fall in the past.  No bleeding, no syncope, no orthopnea, no PND.  Brother had MI in his 45's  Back in 2015 had NUC stress - anteroapical ischemia. EF 64% Cath 2015 normal.  Diltaed aortic root (4.2cm)  Has a history of CVA (L side weakness)  Past Medical History:  Diagnosis Date  . Arthritis   . Bronchitis    hx  . Chest pain 2009   MV with no scar or ischemia, EF 65%  . Complication of anesthesia    claustrophobic !  . Diabetes mellitus   . GERD (gastroesophageal reflux disease)   . History of cardiac catheterization    a. Myoview 2/15 with anteroapical ischemia, EF 64% (intermediate risk) >> LHC - normal cos, EF 55-60%  . Hypertension   . Neuromuscular disorder (HCC)    arotic Aneurysm  . Obesity (BMI 35.0-39.9 without comorbidity)   . Pneumonia   . Stroke [facility])    with some resid L sided weakness  . Thoracic ascending aortic aneurysm (HCC)    a. CT 1/17: ascending aorta 4.2 x 4.0 cm    Past Surgical History:  Procedure Laterality Date  . ABDOMINAL HYSTERECTOMY  2006   Partial, one ovary left   . CARDIAC CATHETERIZATION  2004   [place], [place] per pt.  . CHOLECYSTECTOMY    . EAR CYST EXCISION Left 09/20/2016   Procedure: EXCISION  LEFT EAR LESION;  Surgeon: Newman Pies, MD;  Location: [facility] OR;  Service: ENT;  Laterality: Left;  . LEFT HEART CATHETERIZATION WITH CORONARY ANGIOGRAM N/A 01/09/2014   Procedure: LEFT HEART CATHETERIZATION WITH CORONARY ANGIOGRAM;  Surgeon: Marykay Lex, MD;  Location: [facility] CATH LAB;  Service: Cardiovascular;  Laterality: N/A;  . SKIN SPLIT GRAFT Left 09/20/2016   Procedure: SKIN GRAFT SPLIT THICKNESS;  Surgeon: Newman Pies, MD;  Location: [facility] OR;  Service: ENT;  Laterality: Left;  . TEMPOROMANDIBULAR JOINT SURGERY    . TONSILLECTOMY    . WISDOM TOOTH EXTRACTION      Current Medications: Current Meds  Medication Sig  . albuterol (PROVENTIL HFA;VENTOLIN HFA) 108 (90 BASE) MCG/ACT inhaler Inhale 1-2 puffs into the lungs every 6 (six) hours as needed for wheezing.  Marland Kitchen aspirin EC 81 MG tablet Take 81 mg by mouth daily.  Marland Kitchen atorvastatin (LIPITOR) 10 MG tablet Take 1 tablet (10 mg total) by mouth daily at 6 PM.  . Cholecalciferol (VITAMIN D3) 1000 units CAPS Take 1 capsule by mouth daily.  Marland Kitchen linagliptin (TRADJENTA) 5 MG TABS tablet Take 5 mg by mouth daily.  Marland Kitchen lisinopril-hydrochlorothiazide (PRINZIDE,ZESTORETIC) 10-12.5 MG tablet TAKE 1  TABLET BY MOUTH DAILY  . metFORMIN (GLUCOPHAGE-XR) 500 MG 24 hr tablet Take 500 mg by mouth daily with supper.  . metoprolol succinate (TOPROL-XL) 50 MG 24 hr tablet TAKE 1 TABLET BY MOUTH DAILY WITH OR IMMEDIATELY FOLLOWING A MEAL  . omeprazole (PRILOSEC) 40 MG capsule Take 40 mg by mouth daily.  Marland Kitchen spironolactone (ALDACTONE) 25 MG tablet Take 12.5 mg by mouth daily.  Marland Kitchen triamcinolone ointment (KENALOG) 0.5 % Apply 1 application topically 2 (two) times daily as needed for rash.     Allergies:   Adhesive [tape]; Codeine; and Empagliflozin   Social History   Socioeconomic History  . Marital status: Married    Spouse name: Not on file  . Number of children: Not on file  . Years of education: Not on file  . Highest education level: Not on file    Occupational History  . Occupation: Engineer, structural for the elderly    Employer: RETIRED  Social Needs  . Financial resource strain: Not on file  . Food insecurity:    Worry: Not on file    Inability: Not on file  . Transportation needs:    Medical: Not on file    Non-medical: Not on file  Tobacco Use  . Smoking status: Never Smoker  . Smokeless tobacco: Never Used  Substance and Sexual Activity  . Alcohol use: No  . Drug use: No  . Sexual activity: Never    Birth control/protection: Post-menopausal  Lifestyle  . Physical activity:    Days per week: Not on file    Minutes per session: Not on file  . Stress: Not on file  Relationships  . Social connections:    Talks on phone: Not on file    Gets together: Not on file    Attends religious service: Not on file    Active member of club or organization: Not on file    Attends meetings of clubs or organizations: Not on file    Relationship status: Not on file  Other Topics Concern  . Not on file  Social History Narrative   Family History Details: Her mother died at age 58 of pancreatitis. Her father died of mitral valve disease at age 9. She has a brother who has had myocardial infarction and also has had bypass surgery and a sister who    has a pacemaker.           Family History: The patient's family history includes CAD in her son; Diabetes in her maternal grandmother; Heart disease in her brother and father; Kidney disease in her son; Pancreatitis in her mother; Valvular heart disease in her brother and father.  ROS:   Please see the history of present illness.     All other systems reviewed and are negative.  EKGs/Labs/Other Studies Reviewed:    The following studies were reviewed today: ECHO 08/28/16 - Left ventricle: The cavity size was normal. There was mild   concentric hypertrophy. Systolic function was normal. The   estimated ejection fraction was in the range of 60% to 65%. Wall   motion was normal; there were  no regional wall motion   abnormalities. Doppler parameters are consistent with abnormal   left ventricular relaxation (grade 1 diastolic dysfunction).   Doppler parameters are consistent with elevated ventricular   end-diastolic filling pressure. - Aortic valve: Trileaflet; normal thickness leaflets. There was no   regurgitation. - Aortic root: The aortic root was normal in size. - Mitral valve: Structurally normal valve. There  was mild   regurgitation. - Left atrium: The atrium was normal in size. - Right ventricle: The cavity size was normal. Wall thickness was   normal. Systolic function was normal. - Right atrium: The atrium was normal in size. - Tricuspid valve: There was mild regurgitation. - Pulmonic valve: There was no regurgitation. - Pulmonary arteries: Systolic pressure was mildly increased. PA   peak pressure: 37 mm Hg (S). - Inferior vena cava: The vessel was normal in size. - Pericardium, extracardiac: There was no pericardial effusion.  CT scan 06/19/17: Cardiovascular: No evidence of aortic dissection or intramural hematoma. Maximal diameter of the ascending aorta is 4.1 cm compared with 4.1 cm on the prior study based on my direct measurements. Mild atherosclerotic calcification and plaque in the aortic arch. Great vessels are patent within the confines of the examination. There is no obvious evidence of acute pulmonary thromboembolism.  EKG: No new  Recent Labs: 08/28/2017: ALT 12 01/21/2018: BUN 17; Creatinine, Ser 1.03; Hemoglobin 13.2; Platelets 257; Potassium 3.3; Sodium 139  Recent Lipid Panel    Component Value Date/Time   CHOL 148 08/27/2016 0340   TRIG 72 08/27/2016 0340   HDL 38 (L) 08/27/2016 0340   CHOLHDL 3.9 08/27/2016 0340   VLDL 14 08/27/2016 0340   LDLCALC 96 08/27/2016 0340    Physical Exam:    VS:  BP 122/74   Pulse 60   Ht 5\' 3"  (1.6 m)   Wt 185 lb 6.4 oz (84.1 kg)   SpO2 98%   BMI 32.84 kg/m     Wt Readings from Last 3  Encounters:  06/28/18 185 lb 6.4 oz (84.1 kg)  12/26/17 178 lb 12.8 oz (81.1 kg)  08/28/17 184 lb 8.4 oz (83.7 kg)     GEN:  Well nourished, well developed in no acute distress HEENT: Normal NECK: No JVD; No carotid bruits LYMPHATICS: No lymphadenopathy CARDIAC: RRR, no murmurs, rubs, gallops RESPIRATORY:  Clear to auscultation without rales, wheezing or rhonchi  ABDOMEN: Soft, non-tender, non-distended MUSCULOSKELETAL:  No edema; No deformity  SKIN: Warm and dry NEUROLOGIC:  Alert and oriented x 3 PSYCHIATRIC:  Normal affect   ASSESSMENT:    1. Thoracic aortic aneurysm without rupture (HCC)   2. Essential hypertension   3. Type 2 diabetes mellitus without complication, without long-term current use of insulin (HCC)    PLAN:    In order of problems listed above:   Ascending thoracic aortic aneurysm-mild dilatation of the ascending aorta first noted on chest CT in 2017, 4.1 cm.  CTA of the chest done in 05/2017 demonstrated stable ascending aortic dilatation at 4.1 cm.  Stable.  Pt has family history of aortic aneurysm in father, brother and possibly son.  I will go ahead and check an echocardiogram, ultrasound which should be able to visualize the ascending aorta.  Diastolic dysfunction: Overall stable, no significant shortness of breath currently described.   History of stroke: on aspirin and statin.  Now on aspirin 81.  Statin.  Blood pressure control.  She does have some disequilibrium.  Has a cane at home.  She knows to be careful.  She has had one fall.  Hypertension: Medications reviewed BP well controlled. Continue current therapy.   Her primary physician has been monitoring.  Chronic chest pain: normal coronary arteries on cardiac cath in 12/2013. Still having occ vague chest discomfort, similar to what she has been having. No new concerning symptoms.  Sharp pain.  Likely musculoskeletal.  Reassurance.  Type 2  diabetes: On metformin. Managed by PCP.  No changes made.   Medications reviewed.  Medication Adjustments/Labs and Tests Ordered: Current medicines are reviewed at length with the patient today.  Concerns regarding medicines are outlined above.  Orders Placed This Encounter  Procedures  . ECHOCARDIOGRAM COMPLETE   No orders of the defined types were placed in this encounter.   Patient Instructions  Medication Instructions:   Your physician recommends that you continue on your current medications as directed. Please refer to the Current Medication list given to you today.  If you need a refill on your cardiac medications before your next appointment, please call your pharmacy.  Labwork: NONE ORDERED  TODAY    Testing/Procedures: .Your physician has requested that you have an echocardiogram. Echocardiography is a painless test that uses sound waves to create images of your heart. It provides your doctor with information about the size and shape of your heart and how well your heart's chambers and valves are working. This procedure takes approximately one hour. There are no restrictions for this procedure.   Follow-Up: Your physician wants you to follow-up in: ONE YEAR WITH Lain Tetterton  You will receive a reminder letter in the mail two months in advance. If you don't receive a letter, please call our office to schedule the follow-up appointment.     Any Other Special Instructions Will Be Listed Below (If Applicable).                                                                                                                                                      Signed, Donato Schultz, MD  06/28/2018 8:21 AM     [facility]

## 2018-07-04 ENCOUNTER — Other Ambulatory Visit (HOSPITAL_COMMUNITY): Payer: Medicare Other

## 2018-07-05 ENCOUNTER — Other Ambulatory Visit: Payer: Self-pay

## 2018-07-05 ENCOUNTER — Ambulatory Visit (HOSPITAL_COMMUNITY): Payer: Medicare Other | Attending: Cardiology

## 2018-07-05 DIAGNOSIS — I119 Hypertensive heart disease without heart failure: Secondary | ICD-10-CM | POA: Insufficient documentation

## 2018-07-05 DIAGNOSIS — I712 Thoracic aortic aneurysm, without rupture, unspecified: Secondary | ICD-10-CM

## 2018-07-05 DIAGNOSIS — E119 Type 2 diabetes mellitus without complications: Secondary | ICD-10-CM | POA: Diagnosis not present

## 2018-07-05 DIAGNOSIS — Z8249 Family history of ischemic heart disease and other diseases of the circulatory system: Secondary | ICD-10-CM | POA: Diagnosis not present

## 2018-07-05 DIAGNOSIS — R079 Chest pain, unspecified: Secondary | ICD-10-CM | POA: Diagnosis not present

## 2018-07-05 DIAGNOSIS — R0600 Dyspnea, unspecified: Secondary | ICD-10-CM | POA: Diagnosis not present

## 2018-08-19 ENCOUNTER — Telehealth: Payer: Self-pay | Admitting: Cardiology

## 2018-08-19 NOTE — Telephone Encounter (Signed)
Encounter not needed

## 2018-12-15 ENCOUNTER — Emergency Department (HOSPITAL_COMMUNITY): Payer: Medicare Other

## 2018-12-15 ENCOUNTER — Encounter (HOSPITAL_COMMUNITY): Payer: Self-pay

## 2018-12-15 ENCOUNTER — Emergency Department (HOSPITAL_COMMUNITY)
Admission: EM | Admit: 2018-12-15 | Discharge: 2018-12-15 | Disposition: A | Discharge status: Home / Self Care | Payer: Medicare Other | Attending: Emergency Medicine | Admitting: Emergency Medicine

## 2018-12-15 DIAGNOSIS — F419 Anxiety disorder, unspecified: Secondary | ICD-10-CM | POA: Diagnosis not present

## 2018-12-15 DIAGNOSIS — Z8673 Personal history of transient ischemic attack (TIA), and cerebral infarction without residual deficits: Secondary | ICD-10-CM

## 2018-12-15 DIAGNOSIS — Z79899 Other long term (current) drug therapy: Secondary | ICD-10-CM | POA: Insufficient documentation

## 2018-12-15 DIAGNOSIS — F449 Dissociative and conversion disorder, unspecified: Secondary | ICD-10-CM

## 2018-12-15 DIAGNOSIS — R4702 Dysphasia: Secondary | ICD-10-CM | POA: Diagnosis present

## 2018-12-15 DIAGNOSIS — R531 Weakness: Secondary | ICD-10-CM | POA: Insufficient documentation

## 2018-12-15 DIAGNOSIS — R079 Chest pain, unspecified: Secondary | ICD-10-CM | POA: Insufficient documentation

## 2018-12-15 DIAGNOSIS — I10 Essential (primary) hypertension: Secondary | ICD-10-CM | POA: Diagnosis not present

## 2018-12-15 DIAGNOSIS — R202 Paresthesia of skin: Secondary | ICD-10-CM | POA: Insufficient documentation

## 2018-12-15 DIAGNOSIS — R471 Dysarthria and anarthria: Secondary | ICD-10-CM

## 2018-12-15 DIAGNOSIS — E119 Type 2 diabetes mellitus without complications: Secondary | ICD-10-CM | POA: Diagnosis not present

## 2018-12-15 DIAGNOSIS — F410 Panic disorder [episodic paroxysmal anxiety] without agoraphobia: Secondary | ICD-10-CM

## 2018-12-15 DIAGNOSIS — R0789 Other chest pain: Secondary | ICD-10-CM

## 2018-12-15 LAB — CBC
HEMATOCRIT: 43.4 % (ref 36.0–46.0)
HEMOGLOBIN: 13.6 g/dL (ref 12.0–15.0)
MCH: 29 pg (ref 26.0–34.0)
MCHC: 31.3 g/dL (ref 30.0–36.0)
MCV: 92.5 fL (ref 80.0–100.0)
NRBC: 0 % (ref 0.0–0.2)
Platelets: 267 10*3/uL (ref 150–400)
RBC: 4.69 MIL/uL (ref 3.87–5.11)
RDW: 13.4 % (ref 11.5–15.5)
WBC: 7.7 10*3/uL (ref 4.0–10.5)

## 2018-12-15 LAB — COMPREHENSIVE METABOLIC PANEL
ALK PHOS: 80 U/L (ref 38–126)
ALT: 11 U/L (ref 0–44)
ANION GAP: 12 (ref 5–15)
AST: 15 U/L (ref 15–41)
Albumin: 3.9 g/dL (ref 3.5–5.0)
BUN: 15 mg/dL (ref 8–23)
CALCIUM: 9.7 mg/dL (ref 8.9–10.3)
CO2: 22 mmol/L (ref 22–32)
Chloride: 103 mmol/L (ref 98–111)
Creatinine, Ser: 1.12 mg/dL — ABNORMAL HIGH (ref 0.44–1.00)
GFR calc Af Amer: 56 mL/min — ABNORMAL LOW (ref >60)
GFR calc non Af Amer: 49 mL/min — ABNORMAL LOW (ref >60)
GLUCOSE: 150 mg/dL — AB (ref 70–99)
POTASSIUM: 3.8 mmol/L (ref 3.5–5.1)
SODIUM: 137 mmol/L (ref 135–145)
Total Bilirubin: 0.6 mg/dL (ref 0.3–1.2)
Total Protein: 7.5 g/dL (ref 6.5–8.1)

## 2018-12-15 LAB — DIFFERENTIAL
Abs Immature Granulocytes: 0.02 10*3/uL (ref 0.00–0.07)
BASOS ABS: 0 10*3/uL (ref 0.0–0.1)
Basophils Relative: 1 %
Eosinophils Absolute: 0.1 10*3/uL (ref 0.0–0.5)
Eosinophils Relative: 2 %
Immature Granulocytes: 0 %
LYMPHS PCT: 50 %
Lymphs Abs: 3.9 10*3/uL (ref 0.7–4.0)
Monocytes Absolute: 0.5 10*3/uL (ref 0.1–1.0)
Monocytes Relative: 6 %
NEUTROS ABS: 3.1 10*3/uL (ref 1.7–7.7)
Neutrophils Relative %: 41 %

## 2018-12-15 LAB — PROTIME-INR
INR: 1.05
Prothrombin Time: 13.6 seconds (ref 11.4–15.2)

## 2018-12-15 LAB — TROPONIN I: Troponin I: <0.03 ng/mL (ref <0.03)

## 2018-12-15 LAB — APTT: aPTT: 30 seconds (ref 24–36)

## 2018-12-15 LAB — I-STAT CREATININE, ED: Creatinine, Ser: 1.2 mg/dL — ABNORMAL HIGH (ref 0.44–1.00)

## 2018-12-15 LAB — I-STAT TROPONIN, ED: Troponin i, poc: 0 ng/mL (ref 0.00–0.08)

## 2018-12-15 MED ORDER — ACETAMINOPHEN 325 MG PO TABS
650.0000 mg | ORAL_TABLET | Freq: Once | ORAL | Status: AC
  Administered 2018-12-15: 650 mg via ORAL
  Filled 2018-12-15: qty 2

## 2018-12-15 MED ORDER — SODIUM CHLORIDE 0.9% FLUSH: 3.0000 mL | Freq: Once | INTRAVENOUS | Status: DC

## 2018-12-15 MED ORDER — LORAZEPAM 2 MG/ML IJ SOLN: 1.0000 mg | Freq: Once | INTRAMUSCULAR | Status: DC | PRN

## 2018-12-15 NOTE — ED Notes (Signed)
Pt ambulated w/ a steady gait out of room and down hall.  Pt was able to stand independently, denied dizziness/weakness. Dr. Ranae Palms is aware.

## 2018-12-15 NOTE — ED Notes (Addendum)
Sort RN went to get pt out of car.  Pt able to stand and sit in wheelchair without difficulty.  Family reports pt has chest pain and difficulty speaking.  Family initially states symptoms started 2 hours ago and then reports they started 1 hour ago. Pt was by herself and called family to bring her to hospital.  States she was last seen normal last night. Pt with difficulty speaking.  After numerous attempts to talk pt states speech difficulty started on the way to hospital.  Son states initially pt only complained of chest pain and on the way to the hospital her "mouth started twisting" and she was unable to talk that started approx "10-15 min ago".  LSN time 1520.Marland Kitchen  She told them it was getting worse and to drive faster.  No arm drift noted.  Agricultural consultant and NS notified to page Code Stroke.  Pt taken to Dr. Jeanell Sparrow and cleared for CT.  Met Dr. Lita Mains at bridge.  Pt then started having L arm and leg weakness at 1542.  Handoff to [place], RN at bridge and pt to CT.

## 2018-12-15 NOTE — ED Provider Notes (Signed)
[facility] EMERGENCY DEPARTMENT Provider Note   CSN: [id number] Arrival date & time: 12/15/18  1536   An emergency department physician performed an initial assessment on this suspected stroke patient at 7.  History   Chief Complaint Chief Complaint  Patient presents with  . Code Stroke    HPI [name] [name] is a 73 y.o. female.  HPI History is limited due to patient's expressive aphasia.  Level 5 caveat applies. developed 20 minutes prior to arrival.  Also has some left sided numbness and weakness.  Patient states she had chest pain which started yesterday.  Worse with leaning forward and better when leaning back. Past Medical History:  Diagnosis Date  . Arthritis   . Bronchitis    hx  . Chest pain 2009   MV with no scar or ischemia, EF 65%  . Complication of anesthesia    claustrophobic !  . Diabetes mellitus   . GERD (gastroesophageal reflux disease)   . History of cardiac catheterization    a. Myoview 2/15 with anteroapical ischemia, EF 64% (intermediate risk) >> LHC - normal cos, EF 55-60%  . Hypertension   . Neuromuscular disorder (HCC)    arotic Aneurysm  . Obesity (BMI 35.0-39.9 without comorbidity)   . Pneumonia   . Stroke [facility])    with some resid L sided weakness  . Thoracic ascending aortic aneurysm (HCC)    a. CT 1/17: ascending aorta 4.2 x 4.0 cm    Patient Active Problem List   Diagnosis Date Noted  . Chronic sinusitis   . Complicated migraine   . Pure hypercholesterolemia   . TIA (transient ischemic attack)   . Atypical chest pain   . Cerebral infarction (HCC) 08/26/2016  . Chronic diastolic heart failure, NYHA class 2 (HCC) 12/07/2015  . Pulmonary HTN (HCC) 12/07/2015  . Chronic chest pain 12/07/2015  . Thoracic aortic aneurysm (HCC) 12/07/2015  . Left sided numbness 12/07/2015  . Bradycardia 05/06/2012  . CVA (cerebral vascular accident) (HCC) 05/05/2012  . Obesity due to excess calories 04/25/2012  . Cystocele  03/22/2012  . Type 2 diabetes mellitus without complication, without long-term current use of insulin (HCC) 03/22/2012  . Essential hypertension 03/22/2012  . gerd 03/22/2012    Past Surgical History:  Procedure Laterality Date  . ABDOMINAL HYSTERECTOMY  2006   Partial, one ovary left   . CARDIAC CATHETERIZATION  2004   [place] per pt.  . CHOLECYSTECTOMY    . EAR CYST EXCISION Left 09/20/2016   Procedure: EXCISION  LEFT EAR LESION;  Surgeon: Newman Pies, MD;  Location: [facility] OR;  Service: ENT;  Laterality: Left;  . LEFT HEART CATHETERIZATION WITH CORONARY ANGIOGRAM N/A 01/09/2014   Procedure: LEFT HEART CATHETERIZATION WITH CORONARY ANGIOGRAM;  Surgeon: Marykay Lex, MD;  Location: [facility] CATH LAB;  Service: Cardiovascular;  Laterality: N/A;  . SKIN SPLIT GRAFT Left 09/20/2016   Procedure: SKIN GRAFT SPLIT THICKNESS;  Surgeon: Newman Pies, MD;  Location: [facility] OR;  Service: ENT;  Laterality: Left;  . TEMPOROMANDIBULAR JOINT SURGERY    . TONSILLECTOMY    . WISDOM TOOTH EXTRACTION       OB History    Gravida  4   Para  4   Term  4   Preterm  0   AB  0   Living  4     SAB  0   TAB  0   Ectopic  0   Multiple  0  Live Births           Obstetric Comments  Delivered 4 large babies vaginally         Home Medications    Prior to Admission medications   Medication Sig Start Date End Date Taking? Authorizing Provider  aspirin EC 81 MG tablet Take 81 mg by mouth daily.   Yes [provider]  atorvastatin (LIPITOR) 10 MG tablet Take 1 tablet (10 mg total) by mouth daily at 6 PM. 12/09/15  Yes Rai, Ripudeep K, MD  Cholecalciferol (VITAMIN D3) 1000 units CAPS Take 1 capsule by mouth daily.   Yes [provider]  linagliptin (TRADJENTA) 5 MG TABS tablet Take 5 mg by mouth daily.   Yes [provider]  lisinopril-hydrochlorothiazide (PRINZIDE,ZESTORETIC) 10-12.5 MG tablet TAKE 1 TABLET BY MOUTH DAILY Patient taking differently: Take 1 tablet by  mouth daily.  12/27/15  Yes Dove, Myra C, MD  metFORMIN (GLUCOPHAGE-XR) 500 MG 24 hr tablet Take 500 mg by mouth daily with supper. 04/23/17  Yes [provider]  metoprolol succinate (TOPROL-XL) 50 MG 24 hr tablet TAKE 1 TABLET BY MOUTH DAILY WITH OR IMMEDIATELY FOLLOWING A MEAL Patient taking differently: Take 50 mg by mouth daily.  07/16/17  Yes Weaver, Scott T, PA-C  Olopatadine HCl 0.2 % SOLN Place 1 drop into both eyes every evening.   Yes [provider]  omeprazole (PRILOSEC) 40 MG capsule Take 40 mg by mouth daily.   Yes [provider]  spironolactone (ALDACTONE) 25 MG tablet Take 12.5 mg by mouth daily.   Yes [provider]  albuterol (PROVENTIL HFA;VENTOLIN HFA) 108 (90 BASE) MCG/ACT inhaler Inhale 1-2 puffs into the lungs every 6 (six) hours as needed for wheezing. Patient not taking: Reported on 12/15/2018 07/29/13   Reuben LikesKeller, Naeemah Jasmer C, MD    Family History Family History  Problem Relation Age of Onset  . Heart disease Father   . Valvular heart disease Father        aortic valve replacement  . Pancreatitis Mother   . Diabetes Maternal Grandmother   . Heart disease Brother   . Valvular heart disease Brother        aortic valve replacement  . CAD Son        heart attack  . Kidney disease Son     Social History Social History   Tobacco Use  . Smoking status: Never Smoker  . Smokeless tobacco: Never Used  Substance Use Topics  . Alcohol use: No  . Drug use: No     Allergies   Adhesive [tape]; Codeine; and Empagliflozin   Review of Systems Review of Systems  Unable to perform ROS: Acuity of condition  Eyes: Negative for visual disturbance.  Respiratory: Negative for shortness of breath.   Cardiovascular: Positive for chest pain.  Gastrointestinal: Negative for abdominal pain.  Musculoskeletal: Negative for neck pain.  Skin: Negative for wound.  Neurological: Positive for speech difficulty, weakness and numbness. Negative for  headaches.  Psychiatric/Behavioral: The patient is nervous/anxious.      Physical Exam Updated Vital Signs BP 117/90   Pulse 71   Resp 18   Ht 5\' 3"  (1.6 m)   Wt 84.5 kg   SpO2 98%   BMI 33.00 kg/m   Physical Exam Vitals signs and nursing note reviewed.  Constitutional:      Appearance: Normal appearance. She is well-developed.  HENT:     Head: Normocephalic and atraumatic.     Comments: Diminished  sensation to the left upper and left lower face to light touch.  No obvious trauma.    Mouth/Throat:     Mouth: Mucous membranes are moist.     Pharynx: No oropharyngeal exudate.  Eyes:     Extraocular Movements: Extraocular movements intact.     Pupils: Pupils are equal, round, and reactive to light.  Neck:     Musculoskeletal: Normal range of motion and neck supple. No neck rigidity or muscular tenderness.  Cardiovascular:     Rate and Rhythm: Normal rate and regular rhythm.     Heart sounds: No murmur. No friction rub. No gallop.   Pulmonary:     Effort: Pulmonary effort is normal. No respiratory distress.     Breath sounds: Normal breath sounds. No stridor. No wheezing, rhonchi or rales.     Comments: Stuttering expressive aphasia.  4/5 motor in left upper extremity.  3/5 motor in left lower extremity.  Diminished sensation to light touch in left upper and left lower extremities.  5/5 motor and right upper and right lower extremities.  Sensation intact.  Visual fields intact.  Finger-nose testing intact on the right side.  Some ataxia on the left. Chest:     Chest wall: Tenderness present.  Abdominal:     General: Bowel sounds are normal.     Palpations: Abdomen is soft.     Tenderness: There is no abdominal tenderness. There is no guarding or rebound.  Musculoskeletal: Normal range of motion.        General: No tenderness.  Lymphadenopathy:     Cervical: No cervical adenopathy.  Skin:    General: Skin is warm and dry.     Findings: No erythema or rash.  Neurological:      Mental Status: She is alert and oriented to person, place, and time.  Psychiatric:        Behavior: Behavior normal.     Comments: Anxious and at times tearful      ED Treatments / Results  Labs (all labs ordered are listed, but only abnormal results are displayed) Labs Reviewed  COMPREHENSIVE METABOLIC PANEL - Abnormal; Notable for the following components:      Result Value   Glucose, Bld 150 (*)    Creatinine, Ser 1.12 (*)    GFR calc non Af Amer 49 (*)    GFR calc Af Amer 56 (*)    All other components within normal limits  I-STAT CREATININE, ED - Abnormal; Notable for the following components:   Creatinine, Ser 1.20 (*)    All other components within normal limits  PROTIME-INR  APTT  CBC  DIFFERENTIAL  TROPONIN I  I-STAT TROPONIN, ED  CBG MONITORING, ED    EKG EKG Interpretation  Date/Time:  Sunday December 15 2018 16:12:06 EST Ventricular Rate:  66 PR Interval:    QRS Duration: 99 QT Interval:  442 QTC Calculation: 464 R Axis:   -24 Text Interpretation:  Sinus rhythm Borderline left axis deviation Confirmed by Loren Racer ([id number]) on 12/15/2018 4:33:04 PM Also confirmed by Loren Racer ([id number]), editor Lanae Boast [phone])  on 12/15/2018 4:46:32 PM   Radiology Mr Brain Wo Contrast  Result Date: 12/15/2018 CLINICAL DATA:  New onset of slurred speech. Aphasia. Left-sided weakness. EXAM: MRI HEAD WITHOUT CONTRAST TECHNIQUE: Multiplanar, multiecho pulse sequences of the brain and surrounding structures were obtained without intravenous contrast. COMPARISON:  CT earlier same day.  MRI 08/28/2017. FINDINGS: Brain: Diffusion imaging does not show any acute or subacute infarction.  The brainstem and cerebellum are normal. Cerebral hemispheres show mild chronic small-vessel ischemic change of the white matter, less than often seen at this age. No cortical or large vessel territory infarction. No evidence of mass lesion, hemorrhage, hydrocephalus or extra-axial  collection. Vascular: Major vessels at the base of the brain show flow. Skull and upper cervical spine: Negative Sinuses/Orbits: Chronic inflammatory changes of the right maxillary sinus. Other sinuses are clear. Orbits negative. Other: None IMPRESSION: Essentially normal study for a patient of this age. No sign of acute or subacute infarction. Minimal small vessel change of the white matter. Chronic inflammatory changes of the right maxillary sinus. Electronically Signed   By: Paulina FusiMark  Shogry M.D.   On: 12/15/2018 17:00   Dg Chest Port 1 View  Result Date: 12/15/2018 CLINICAL DATA:  Chest pain and shortness of breath EXAM: PORTABLE CHEST 1 VIEW COMPARISON:  01/21/2018 FINDINGS: Cardiac shadow is within normal limits. Tortuous thoracic aorta is again seen. The lungs are clear. No bony abnormality is noted. IMPRESSION: No active disease. Electronically Signed   By: Alcide CleverMark  Lukens M.D.   On: 12/15/2018 17:41   Ct Head Code Stroke Wo Contrast  Result Date: 12/15/2018 CLINICAL DATA:  Code stroke. Aphasia. Left-sided weakness. Diabetes. EXAM: CT HEAD WITHOUT CONTRAST TECHNIQUE: Contiguous axial images were obtained from the base of the skull through the vertex without intravenous contrast. COMPARISON:  MRI and CT 08/28/2017. FINDINGS: Brain: No CT evidence of acute infarction, mass lesion, hemorrhage, hydrocephalus or extra-axial collection. Brainstem and cerebellum appear normal. Cerebral hemispheres show a small subcortical cyst in the deep insular brain on the right. This is unchanged and not likely significant. Vascular: There is atherosclerotic calcification of the major vessels at the base of the brain. Skull: Negative Sinuses/Orbits: Chronic opacification of the right maxillary sinus. Orbits negative. Other: None ASPECTS ([place]berta Stroke Program Early CT Score) - Ganglionic level infarction (caudate, lentiform nuclei, internal capsule, insula, M1-M3 cortex): 7 - Supraganglionic infarction (M4-M6 cortex): 3 Total  score (0-10 with 10 being normal): 10 IMPRESSION: 1. No acute finding by CT. 2. ASPECTS is 10. * These results were communicated to Dr. Otelia LimesLindzen at 4:00 pmon 1/26/2020by text page via the [facility] messaging system. Electronically Signed   By: Paulina FusiMark  Shogry M.D.   On: 12/15/2018 16:01    Procedures Procedures (including critical care time)  Medications Ordered in ED Medications  sodium chloride flush (NS) 0.9 % injection 3 mL (has no administration in time range)  LORazepam (ATIVAN) injection 1 mg (has no administration in time range)  acetaminophen (TYLENOL) tablet 650 mg (650 mg Oral Given 12/15/18 1836)     Initial Impression / Assessment and Plan / ED Course  I have reviewed the triage vital signs and the nursing notes.  Pertinent labs & imaging results that were available during my care of the patient were reviewed by me and considered in my medical decision making (see chart for details).    Evaluated by neurology.  CT head without acute findings.  Recommend MRI.  MRI without acute findings. Neurology believes symptoms are likely psychogenic in origin.  Recommends discharge home.  Patient/chest pain is very atypical.  Currently denying chest pain.  Troponin x2 is normal.  Low suspicion for thoracic aorta dissection.  Strict return precautions given. Final Clinical Impressions(s) / ED Diagnoses   Final diagnoses:  Dysarthria  Atypical chest pain    ED Discharge Orders    None       Loren RacerYelverton, Abdoulie Tierce, MD 12/15/18 2105

## 2018-12-15 NOTE — ED Triage Notes (Signed)
Pt arrives from home via POV with complaints of chest pain for about an hour pta. Pt presented to ED with stroke symptoms, including left sided weakness, left sided facial droop and numbness, and aphasia. Pt brought to CT scanner 1 and Neurology bedside at this time for evaluation and planning. Pt states she does not have cp at this time. Pt placed in position of comfort with call bell in reach, bed locked and lowered.

## 2018-12-15 NOTE — Consult Note (Signed)
Stroke Neurology Consultation Note  Consult Requested by: Dr. Ranae PalmsYelverton  Reason for Consult: Speech difficulty  Consult Date: 12/15/18  The history was obtained from the patient and daughter and son.  During history and examination, all items were able to obtain unless otherwise noted.  History of Present Illness:  [name] [name] is a 73 y.o. African American female with PMH of a previous stroke on MRI, unruptured thoracic ascending aortic aneurysm, hypertension, diabetes mellitus, history of chest pain with previous normal cardiac catheterization in 2015, migraine presented with chest pain followed by stuttering speech.  As per patient, she was at her baseline at home at 1430 when she started to have chest pain.  Chest pain seems positional, sitting up made chest pain worse, lying down made chest pain getting better.  She also had feeling of shortness of breath.  Her children was called and they sent patient to ER.  In-year-old and in ER, patient started to have stuttering speech, and reported to have left facial droop.  Code stroke called for evaluation.  Patient had previous several episodes of transient slurred speech and left-sided weakness in the setting of hypertension and history of a migraine.  MRI every time negative for stroke.  Last episode was in 08/2016 with MRI no acute infarct but remote right insular infarct.  MRA, carotid Doppler, 2D echo unremarkable.  LDL 96 and A1c point 0.4.  She was continued on aspirin 325 and Lipitor 20 on discharge.  Patient also had a history of chest pain but cardiac cath in 2015 was normal.  As per daughter and son, patient does have stress for the last 2 weeks due to family issues.  She also is going to have cardiac follow-up with cardiology in [place] next week.  And family knows she had episode of anxiety and a panic attack in the past.  LSN: [id number] today tPA Given: No: Likely non-stroke event  Past Medical History:  Diagnosis Date  . Arthritis   .  Bronchitis    hx  . Chest pain 2009   MV with no scar or ischemia, EF 65%  . Complication of anesthesia    claustrophobic !  . Diabetes mellitus   . GERD (gastroesophageal reflux disease)   . History of cardiac catheterization    a. Myoview 2/15 with anteroapical ischemia, EF 64% (intermediate risk) >> LHC - normal cos, EF 55-60%  . Hypertension   . Neuromuscular disorder (HCC)    arotic Aneurysm  . Obesity (BMI 35.0-39.9 without comorbidity)   . Pneumonia   . Stroke [facility](H[facility])    with some resid L sided weakness  . Thoracic ascending aortic aneurysm (HCC)    a. CT 1/17: ascending aorta 4.2 x 4.0 cm    Past Surgical History:  Procedure Laterality Date  . ABDOMINAL HYSTERECTOMY  2006   Partial, one ovary left   . [place]  . CHOLECYSTECTOMY    . EAR CYST EXCISION Left 09/20/2016   Procedure: EXCISION  LEFT EAR LESION;  Surgeon: Newman PiesSu Teoh, MD;  Location: [facility] OR;  Service: ENT;  Laterality: Left;  . LEFT HEART CATHETERIZATION WITH CORONARY ANGIOGRAM N/A 01/09/2014   Procedure: LEFT HEART CATHETERIZATION WITH CORONARY ANGIOGRAM;  Surgeon: Marykay Lexavid W Harding, MD;  [facility];  Service: Cardiovascular;  Laterality: N/A;  . SKIN SPLIT GRAFT Left 09/20/2016   Procedure: SKIN GRAFT SPLIT THICKNESS;  Surgeon: Newman PiesSu Teoh, MD;  Location: [facility] OR;  Service:  ENT;  Laterality: Left;  . TEMPOROMANDIBULAR JOINT SURGERY    . TONSILLECTOMY    . WISDOM TOOTH EXTRACTION      Family History  Problem Relation Age of Onset  . Heart disease Father   . Valvular heart disease Father        aortic valve replacement  . Pancreatitis Mother   . Diabetes Maternal Grandmother   . Heart disease Brother   . Valvular heart disease Brother        aortic valve replacement  . CAD Son        heart attack  . Kidney disease Son     Social History:  reports that she has never smoked. She has never used smokeless tobacco. She reports that she does not drink  alcohol or use drugs.  Allergies:  Allergies  Allergen Reactions  . Adhesive [Tape] Rash    pls use elastic wraps instead of adhesive tape  . Codeine Nausea And Vomiting  . Empagliflozin Other (See Comments)    Headaches & lower back pain    No current facility-administered medications on file prior to encounter.    Current Outpatient Medications on File Prior to Encounter  Medication Sig Dispense Refill  . albuterol (PROVENTIL HFA;VENTOLIN HFA) 108 (90 BASE) MCG/ACT inhaler Inhale 1-2 puffs into the lungs every 6 (six) hours as needed for wheezing. 1 Inhaler 0  . aspirin EC 81 MG tablet Take 81 mg by mouth daily.    Marland Kitchen. atorvastatin (LIPITOR) 10 MG tablet Take 1 tablet (10 mg total) by mouth daily at 6 PM. 30 tablet 3  . Cholecalciferol (VITAMIN D3) 1000 units CAPS Take 1 capsule by mouth daily.    Marland Kitchen. linagliptin (TRADJENTA) 5 MG TABS tablet Take 5 mg by mouth daily.    Marland Kitchen. lisinopril-hydrochlorothiazide (PRINZIDE,ZESTORETIC) 10-12.5 MG tablet TAKE 1 TABLET BY MOUTH DAILY 90 tablet 0  . metFORMIN (GLUCOPHAGE-XR) 500 MG 24 hr tablet Take 500 mg by mouth daily with supper.  3  . metoprolol succinate (TOPROL-XL) 50 MG 24 hr tablet TAKE 1 TABLET BY MOUTH DAILY WITH OR IMMEDIATELY FOLLOWING A MEAL 15 tablet 0  . omeprazole (PRILOSEC) 40 MG capsule Take 40 mg by mouth daily.    Marland Kitchen. spironolactone (ALDACTONE) 25 MG tablet Take 12.5 mg by mouth daily.    Marland Kitchen. triamcinolone ointment (KENALOG) 0.5 % Apply 1 application topically 2 (two) times daily as needed for rash.  0    Review of Systems: A full ROS was attempted today and was able to be performed.  Systems assessed include - Constitutional, Eyes, HENT, Respiratory, Cardiovascular, Gastrointestinal, Genitourinary, Integument/breast, Hematologic/lymphatic, Musculoskeletal, Neurological, Behavioral/Psych, Endocrine, Allergic/Immunologic - with pertinent responses as per HPI.  Physical Examination: Weight:  [84.4 kg-84.5 kg] 84.4 kg (01/26  1652)  General - well nourished, well developed, crying and upset about her starting speech.    Ophthalmologic - fundi not visualized due to noncooperation.    Cardiovascular - regular rate and rhythm  Mental Status -  Level of arousal and orientation to time, place, and person were intact. Language including naming, repetition, comprehension was assessed and found intact, however significant stuttering speech, wax and wane.  Cranial Nerves II - XII - II - Vision intact OU. III, IV, VI - Extraocular movements intact. V - Facial sensation subjectively decreased on the left, however midline split with forehead tuning fork testing. VII - mild right nasolabial fold flattening. VIII - Hearing & vestibular intact bilaterally. X - Palate elevates symmetrically. XI Claudie Fisherman- Chin  turning & shoulder shrug intact bilaterally. XII - Tongue protrusion intact.  Motor Strength - The patient's strength was normal in right upper and lower extremities.  However, on exam of left upper and lower extremities, patient lack of effort but positive for Hoover sign and improves with extraction.   Motor Tone & Bulk - Muscle tone was assessed at the neck and appendages and was normal.  Bulk was normal and fasciculations were absent.   Reflexes - The patient's reflexes were normal in all extremities and she had no pathological reflexes.  Sensory - Light touch, temperature/pinprick were assessed and were subjectively decreased on the left.    Coordination - The patient had normal movements in the hands with no ataxia or dysmetria.  Tremor was absent.  Gait and Station - deferred  Data Reviewed: Mr Brain Wo Contrast  Result Date: 12/15/2018 CLINICAL DATA:  New onset of slurred speech. Aphasia. Left-sided weakness. EXAM: MRI HEAD WITHOUT CONTRAST TECHNIQUE: Multiplanar, multiecho pulse sequences of the brain and surrounding structures were obtained without intravenous contrast. COMPARISON:  CT earlier same day.  MRI  08/28/2017. FINDINGS: Brain: Diffusion imaging does not show any acute or subacute infarction. The brainstem and cerebellum are normal. Cerebral hemispheres show mild chronic small-vessel ischemic change of the white matter, less than often seen at this age. No cortical or large vessel territory infarction. No evidence of mass lesion, hemorrhage, hydrocephalus or extra-axial collection. Vascular: Major vessels at the base of the brain show flow. Skull and upper cervical spine: Negative Sinuses/Orbits: Chronic inflammatory changes of the right maxillary sinus. Other sinuses are clear. Orbits negative. Other: None IMPRESSION: Essentially normal study for a patient of this age. No sign of acute or subacute infarction. Minimal small vessel change of the white matter. Chronic inflammatory changes of the right maxillary sinus. Electronically Signed   By: Paulina Fusi M.D.   On: 12/15/2018 17:00   Ct Head Code Stroke Wo Contrast  Result Date: 12/15/2018 CLINICAL DATA:  Code stroke. Aphasia. Left-sided weakness. Diabetes. EXAM: CT HEAD WITHOUT CONTRAST TECHNIQUE: Contiguous axial images were obtained from the base of the skull through the vertex without intravenous contrast. COMPARISON:  MRI and CT 08/28/2017. FINDINGS: Brain: No CT evidence of acute infarction, mass lesion, hemorrhage, hydrocephalus or extra-axial collection. Brainstem and cerebellum appear normal. Cerebral hemispheres show a small subcortical cyst in the deep insular brain on the right. This is unchanged and not likely significant. Vascular: There is atherosclerotic calcification of the major vessels at the base of the brain. Skull: Negative Sinuses/Orbits: Chronic opacification of the right maxillary sinus. Orbits negative. Other: None ASPECTS ([place]ta Stroke Program Early CT Score) - Ganglionic level infarction (caudate, lentiform nuclei, internal capsule, insula, M1-M3 cortex): 7 - Supraganglionic infarction (M4-M6 cortex): 3 Total score (0-10 with  10 being normal): 10 IMPRESSION: 1. No acute finding by CT. 2. ASPECTS is 10. * These results were communicated to Dr. Otelia Limes at 4:00 pmon 1/26/2020by text page via the [facility] messaging system. Electronically Signed   By: Paulina Fusi M.D.   On: 12/15/2018 16:01    Assessment: 73 y.o. female with PMH of a previous stroke on MRI, unruptured thoracic ascending aortic aneurysm, hypertension, diabetes mellitus, history of chest pain with previous normal cardiac catheterization in 2015, migraine presented with chest pain followed by stuttering speech.  Neuro exam showed a significant stuttering speech, subjective sensory decrease on the left, midline split on forehead tuning fork testing, Hoover signs and improvement with distraction, consistent with anxiety versus conversion  disorder.  CT and MRI did not show acute infarct.  Patient had a history of multiple episode of left-sided weakness and slurred speech but MRI always negative.  Patient had an episode of chest pain but a cardiac cath negative.  Current episode also concern for conversion disorder.  Family admitted that she had lots of stress for the last 2 weeks due to family issues.  Plan: -Current episode most likely due to anxiety versus conversion disorder versus panic attack. -Continue home medication with aspirin and Lipitor for stroke prevention -Relaxation and cope with stress -Discussed with daughter and son as well as Dr. Ranae Palms -Neurology will sign off. Please call with questions. Pt will follow up with neurology in [place] in about 4-6 weeks.   Thank you for this consultation and allowing [place] to participate in the care of this patient.  Marvel Plan, MD PhD Stroke Neurology 12/15/2018 5:50 PM

## 2019-08-26 ENCOUNTER — Telehealth: Payer: Self-pay

## 2019-08-26 NOTE — Telephone Encounter (Signed)
   TELEPHONE CALL NOTE  This patient has been deemed a candidate for follow-up tele-health visit to limit community exposure during the Covid-19 pandemic. I spoke with the patient via phone to discuss instructions. This has been outlined on the patient's AVS (dotphrase: [email or web address]). The patient was advised to review the section on consent for treatment as well. The patient will receive a phone call 2-3 days prior to their E-Visit at which time consent will be verbally confirmed.   A Virtual Office Visit appointment type has been scheduled for 08/27/19 with Dr. Marlou Porch,  appointment notes - patient prefers telephone type.    Janan Halter, RN 08/26/2019 3:32 PM

## 2019-08-26 NOTE — Telephone Encounter (Signed)
I called and left patient a message about tomorrows appointment with Dr. Marlou Porch. He will not be in the office, but he will doing telehealth visits tomorrow if patient wants a telehealth visit. [name] [name] has a 8AM if patient would like to see her tomorrow.

## 2019-08-27 ENCOUNTER — Telehealth (INDEPENDENT_AMBULATORY_CARE_PROVIDER_SITE_OTHER): Payer: Medicare Other | Admitting: Cardiology

## 2019-08-27 ENCOUNTER — Other Ambulatory Visit: Payer: Self-pay

## 2019-08-27 VITALS — BP 138/72 | HR 65 | Ht 63.0 in | Wt 184.5 lb

## 2019-08-27 DIAGNOSIS — I712 Thoracic aortic aneurysm, without rupture, unspecified: Secondary | ICD-10-CM

## 2019-08-27 DIAGNOSIS — E119 Type 2 diabetes mellitus without complications: Secondary | ICD-10-CM

## 2019-08-27 DIAGNOSIS — I5032 Chronic diastolic (congestive) heart failure: Secondary | ICD-10-CM

## 2019-08-27 DIAGNOSIS — I10 Essential (primary) hypertension: Secondary | ICD-10-CM

## 2019-08-27 MED ORDER — ROSUVASTATIN CALCIUM 10 MG PO TABS: 10.0000 mg | ORAL_TABLET | Freq: Every day | ORAL | 11 refills | Status: DC

## 2019-08-27 NOTE — Progress Notes (Addendum)
Cardiology outpatient note:    Telephone visit- 19 minutes spent with chart review as well as discussion with patient.  Secondary to COVID-19 pandemic.  Patient was consented for telephone visit by [name] Arenas, RN prior to encounter.   Date:  08/27/2019   ID:  [name] [name], DOB 08/12/1946, MRN [id number]  PCP:  Verlon Au, MD  Cardiologist:  Donato Schultz, MD   Referring MD: Verlon Au, MD     History of Present Illness:    [name] [name] is a 73 y.o. female seen first in 2019 for evaluation of chest pain and shortness of breath here for follow-up.   Was seen in ER on 01/21/18. Pain at that time was worse with sitting up, worse with deep breaths, better laying flat. No fevers, no PE.   Sharp pain, intermittent, 9/10. Like a shock. Quick off. Hold breath and goes away. Same frequency. Can be when active so sitting.  She also mentions that she has some balance issues at times.  Feels unsteady.  Knows to be careful.  She does have a cane at home.  She has had one fall in the past.  No bleeding, no syncope, no orthopnea, no PND.  Brother had MI in his 35's  Back in 2015 had NUC stress - anteroapical ischemia. EF 64% Cath 2015 normal.  Diltaed aortic root (4.2cm) Echo has been stable 4 cm   Has a history of CVA (L side weakness)  Had some knee and foot pain. No further chest.   Stopped atorvastatin because of hand cramping. Did not stop.     Past Medical History:  Diagnosis Date  . Arthritis   . Bronchitis    hx  . Chest pain 2009   MV with no scar or ischemia, EF 65%  . Complication of anesthesia    claustrophobic !  . Diabetes mellitus   . GERD (gastroesophageal reflux disease)   . History of cardiac catheterization    a. Myoview 2/15 with anteroapical ischemia, EF 64% (intermediate risk) >> LHC - normal cos, EF 55-60%  . Hypertension   . Neuromuscular disorder (HCC)    arotic Aneurysm  . Obesity (BMI 35.0-39.9 without comorbidity)   . Pneumonia    . Stroke [facility])    with some resid L sided weakness  . Thoracic ascending aortic aneurysm (HCC)    a. CT 1/17: ascending aorta 4.2 x 4.0 cm    Past Surgical History:  Procedure Laterality Date  . ABDOMINAL HYSTERECTOMY  2006   Partial, one ovary left   . CARDIAC CATHETERIZATION  2004   [place], [place] per pt.  . CHOLECYSTECTOMY    . EAR CYST EXCISION Left 09/20/2016   Procedure: EXCISION  LEFT EAR LESION;  Surgeon: Newman Pies, MD;  [facility]: [facility] OR;  Service: ENT;  Laterality: Left;  . LEFT HEART CATHETERIZATION WITH CORONARY ANGIOGRAM N/A 01/09/2014   Procedure: LEFT HEART CATHETERIZATION WITH CORONARY ANGIOGRAM;  Surgeon: Marykay Lex, MD;  Location: [facility] C[facility] LAB;  Service: Cardiovascular;  Laterality: N/A;  . SKIN SPLIT GRAFT Left 09/20/2016   Procedure: SKIN GRAFT SPLIT THICKNESS;  Surgeon: Newman Pies, MD;  Location: [facility] OR;  Service: ENT;  Laterality: Left;  . TEMPOROMANDIBULAR JOINT SURGERY    . TONSILLECTOMY    . WISDOM TOOTH EXTRACTION      Current Medications: Current Meds  Medication Sig  . albuterol (PROVENTIL HFA;VENTOLIN HFA) 108 (90 BASE) MCG/ACT inhaler Inhale 1-2 puffs into the lungs every 6 (  six) hours as needed for wheezing.  Marland Kitchen. aspirin EC 81 MG tablet Take 81 mg by mouth daily.  . Cholecalciferol (VITAMIN D3) 1000 units CAPS Take 1 capsule by mouth daily.  Marland Kitchen. linagliptin (TRADJENTA) 5 MG TABS tablet Take 5 mg by mouth daily.  Marland Kitchen. lisinopril-hydrochlorothiazide (PRINZIDE,ZESTORETIC) 10-12.5 MG tablet TAKE 1 TABLET BY MOUTH DAILY (Patient taking differently: Take 1 tablet by mouth daily. )  . metFORMIN (GLUCOPHAGE-XR) 500 MG 24 hr tablet Take 500 mg by mouth daily with supper.  . metoprolol succinate (TOPROL-XL) 50 MG 24 hr tablet TAKE 1 TABLET BY MOUTH DAILY WITH OR IMMEDIATELY FOLLOWING A MEAL (Patient taking differently: Take 50 mg by mouth daily. )  . Olopatadine HCl 0.2 % SOLN Place 1 drop into both eyes every evening.  Marland Kitchen. omeprazole (PRILOSEC) 40 MG capsule Take 40  mg by mouth daily.  Bertram Gala. Polyethyl Glycol-Propyl Glycol (SYSTANE OP) Apply 1 drop to eye 3 (three) times daily.  Marland Kitchen. spironolactone (ALDACTONE) 25 MG tablet Take 12.5 mg by mouth daily.     Allergies:   Adhesive [tape], Codeine, and Empagliflozin   Social History   Socioeconomic History  . Marital status: Widowed    Spouse name: Not on file  . Number of children: Not on file  . Years of education: Not on file  . Highest education level: Not on file  Occupational History  . Occupation: Engineer, structuralCaregiver for the elderly    Employer: RETIRED  Social Needs  . Financial resource strain: Not on file  . Food insecurity    Worry: Not on file    Inability: Not on file  . Transportation needs    Medical: Not on file    Non-medical: Not on file  Tobacco Use  . Smoking status: Never Smoker  . Smokeless tobacco: Never Used  Substance and Sexual Activity  . Alcohol use: No  . Drug use: No  . Sexual activity: Never    Birth control/protection: Post-menopausal  Lifestyle  . Physical activity    Days per week: Not on file    Minutes per session: Not on file  . Stress: Not on file  Relationships  . Social Musicianconnections    Talks on phone: Not on file    Gets together: Not on file    Attends religious service: Not on file    Active member of club or organization: Not on file    Attends meetings of clubs or organizations: Not on file    Relationship status: Not on file  Other Topics Concern  . Not on file  Social History Narrative   Family History Details: Her mother died at age 74 of pancreatitis. Her father died of mitral valve disease at age 74. She has a brother who has had myocardial infarction and also has had bypass surgery and a sister who    has a pacemaker.           Family History: The patient's family history includes CAD in her son; Diabetes in her maternal grandmother; Heart disease in her brother and father; Kidney disease in her son; Pancreatitis in her mother; Valvular heart  disease in her brother and father.  ROS:   Please see the history of present illness.     All other systems reviewed and are negative.  EKGs/Labs/Other Studies Reviewed:    The following studies were reviewed today: ECHO 08/28/16 - Left ventricle: The cavity size was normal. There was mild   concentric hypertrophy. Systolic function was normal. The  estimated ejection fraction was in the range of 60% to 65%. Wall   motion was normal; there were no regional wall motion   abnormalities. Doppler parameters are consistent with abnormal   left ventricular relaxation (grade 1 diastolic dysfunction).   Doppler parameters are consistent with elevated ventricular   end-diastolic filling pressure. - Aortic valve: Trileaflet; normal thickness leaflets. There was no   regurgitation. - Aortic root: The aortic root was normal in size. - Mitral valve: Structurally normal valve. There was mild   regurgitation. - Left atrium: The atrium was normal in size. - Right ventricle: The cavity size was normal. Wall thickness was   normal. Systolic function was normal. - Right atrium: The atrium was normal in size. - Tricuspid valve: There was mild regurgitation. - Pulmonic valve: There was no regurgitation. - Pulmonary arteries: Systolic pressure was mildly increased. PA   peak pressure: 37 mm Hg (S). - Inferior vena cava: The vessel was normal in size. - Pericardium, extracardiac: There was no pericardial effusion.  CT scan 06/19/17: Cardiovascular: No evidence of aortic dissection or intramural hematoma. Maximal diameter of the ascending aorta is 4.1 cm compared with 4.1 cm on the prior study based on my direct measurements. Mild atherosclerotic calcification and plaque in the aortic arch. Great vessels are patent within the confines of the examination. There is no obvious evidence of acute pulmonary thromboembolism.  EKG: No new  Recent Labs: 12/15/2018: ALT 11; BUN 15; Creatinine, Ser 1.20;  Hemoglobin 13.6; Platelets 267; Potassium 3.8; Sodium 137  Recent Lipid Panel    Component Value Date/Time   CHOL 148 08/27/2016 0340   TRIG 72 08/27/2016 0340   HDL 38 (L) 08/27/2016 0340   CHOLHDL 3.9 08/27/2016 0340   VLDL 14 08/27/2016 0340   LDLCALC 96 08/27/2016 0340    Physical Exam:    VS:  BP 138/72   Pulse 65   Ht  (1.6 m)   Wt 184 lb 8 oz (83.7 kg)   BMI 32.68 kg/m     Wt Readings from Last 3 Encounters:  08/27/19 184 lb 8 oz (83.7 kg)  12/15/18 186 lb 4.6 oz (84.5 kg)  12/15/18 186 lb (84.4 kg)     General alert and oriented x3 in no acute distress.  Able to complete full sentences without difficulties.  Pleasant.  ASSESSMENT:    1. Thoracic aortic aneurysm without rupture (HCC)   2. Essential hypertension   3. Type 2 diabetes mellitus without complication, without long-term current use of insulin (HCC)   4. Chronic diastolic heart failure, NYHA class 2 (HCC)    PLAN:    In order of problems listed above:   Ascending thoracic aortic aneurysm-mild dilatation of the ascending aorta first noted on chest CT in 2017, 4.1 cm.  CTA of the chest done in 05/2017 demonstrated stable ascending aortic dilatation at 4.1 cm.  Stable.  Pt has family history of aortic aneurysm in father, brother and possibly son.  Echocardiogram 2019- 40 mm.  Aortic root.  Stable. No need to repeat ECHO this year. We may try again next year.   Diastolic dysfunction: Overall stable, no significant shortness of breath currently described.   History of stroke:  Now on aspirin 81.  Her atorvastatin was stopped previously because of some hand cramping but this did not go away.  She ended up stopping metformin and this helped her with her sensation.  I would like for her to be back on a statin given her  prior history of stroke as well as diabetes.  To make a change, we will try Crestor 10 mg once a day. .  Blood pressure control.  She does have some disequilibrium.  Has a cane at home.  She  knows to be careful.  She has had one fall previously.  Hypertension: Medications reviewed BP well controlled. Continue current therapy.   Her primary physician has been monitoring.  Chronic chest pain: normal coronary arteries on cardiac cath in 12/2013. Still having occ vague chest discomfort, similar to what she has been having. No new concerning symptoms.  Sharp pain.  Likely musculoskeletal.  Reassurance once again stable  Type 2 diabetes: She may have stopped her metformin.     Medications reviewed.  Medication Adjustments/Labs and Tests Ordered: Current medicines are reviewed at length with the patient today.  Concerns regarding medicines are outlined above.  No orders of the defined types were placed in this encounter.  Meds ordered this encounter  Medications  . rosuvastatin (CRESTOR) 10 MG tablet    Sig: Take 1 tablet (10 mg total) by mouth at bedtime.    Dispense:  30 tablet    Refill:  11    Patient Instructions  Medication Instructions:  Please start Crestor 10 mg daily.  Continue all other medications as listed.  If you need a refill on your cardiac medications before your next appointment, please call your pharmacy.   Follow-Up: At [facility], you and your health needs are our priority.  As part of our continuing mission to provide you with exceptional heart care, we have created designated Provider Care Teams.  These Care Teams include your primary Cardiologist (physician) and Advanced Practice Providers (APPs -  Physician Assistants and Nurse Practitioners) who all work together to provide you with the care you need, when you need it. You will need a follow up appointment in 12 months.  Please call our office 2 months in advance to schedule this appointment.  You may see Candee Furbish, MD or one of the following Advanced Practice Providers on your designated Care Team:   Truitt Merle, NP Cecilie Kicks, NP . Kathyrn Drown, NP  Thank you for choosing [facility]!!        Signed, Candee Furbish, MD  08/27/2019 2:23 PM    [place]

## 2019-08-27 NOTE — Patient Instructions (Signed)
Medication Instructions:  Please start Crestor 10 mg daily.  Continue all other medications as listed.  If you need a refill on your cardiac medications before your next appointment, please call your pharmacy.   Follow-Up: At [facility], you and your health needs are our priority.  As part of our continuing mission to provide you with exceptional heart care, we have created designated Provider Care Teams.  These Care Teams include your primary Cardiologist (physician) and Advanced Practice Providers (APPs -  Physician Assistants and Nurse Practitioners) who all work together to provide you with the care you need, when you need it. You will need a follow up appointment in 12 months.  Please call our office 2 months in advance to schedule this appointment.  You may see Candee Furbish, MD or one of the following Advanced Practice Providers on your designated Care Team:   Truitt Merle, NP Cecilie Kicks, NP . Kathyrn Drown, NP  Thank you for choosing [facility]!!

## 2019-10-21 ENCOUNTER — Encounter (HOSPITAL_COMMUNITY): Payer: Self-pay

## 2019-10-21 ENCOUNTER — Ambulatory Visit (INDEPENDENT_AMBULATORY_CARE_PROVIDER_SITE_OTHER)
Admission: EM | Admit: 2019-10-21 | Discharge: 2019-10-21 | Disposition: A | Discharge status: Home / Self Care | Payer: Medicare Other | Source: Home / Self Care

## 2019-10-21 ENCOUNTER — Emergency Department (HOSPITAL_COMMUNITY)
Admission: EM | Admit: 2019-10-21 | Discharge: 2019-10-21 | Disposition: A | Discharge status: Home / Self Care | Payer: Medicare Other | Attending: Emergency Medicine | Admitting: Emergency Medicine

## 2019-10-21 ENCOUNTER — Emergency Department (HOSPITAL_COMMUNITY): Payer: Medicare Other

## 2019-10-21 ENCOUNTER — Other Ambulatory Visit: Payer: Self-pay

## 2019-10-21 ENCOUNTER — Encounter (HOSPITAL_COMMUNITY): Payer: Self-pay | Admitting: Emergency Medicine

## 2019-10-21 DIAGNOSIS — I712 Thoracic aortic aneurysm, without rupture, unspecified: Secondary | ICD-10-CM

## 2019-10-21 DIAGNOSIS — R42 Dizziness and giddiness: Secondary | ICD-10-CM | POA: Insufficient documentation

## 2019-10-21 DIAGNOSIS — R0602 Shortness of breath: Secondary | ICD-10-CM | POA: Diagnosis not present

## 2019-10-21 DIAGNOSIS — Z7982 Long term (current) use of aspirin: Secondary | ICD-10-CM | POA: Insufficient documentation

## 2019-10-21 DIAGNOSIS — R110 Nausea: Secondary | ICD-10-CM | POA: Insufficient documentation

## 2019-10-21 DIAGNOSIS — I110 Hypertensive heart disease with heart failure: Secondary | ICD-10-CM | POA: Insufficient documentation

## 2019-10-21 DIAGNOSIS — I509 Heart failure, unspecified: Secondary | ICD-10-CM

## 2019-10-21 DIAGNOSIS — Z79899 Other long term (current) drug therapy: Secondary | ICD-10-CM | POA: Insufficient documentation

## 2019-10-21 DIAGNOSIS — I200 Unstable angina: Secondary | ICD-10-CM

## 2019-10-21 DIAGNOSIS — R030 Elevated blood-pressure reading, without diagnosis of hypertension: Secondary | ICD-10-CM

## 2019-10-21 DIAGNOSIS — R1013 Epigastric pain: Secondary | ICD-10-CM | POA: Insufficient documentation

## 2019-10-21 DIAGNOSIS — E119 Type 2 diabetes mellitus without complications: Secondary | ICD-10-CM | POA: Diagnosis not present

## 2019-10-21 DIAGNOSIS — I272 Pulmonary hypertension, unspecified: Secondary | ICD-10-CM

## 2019-10-21 DIAGNOSIS — I5032 Chronic diastolic (congestive) heart failure: Secondary | ICD-10-CM | POA: Diagnosis not present

## 2019-10-21 DIAGNOSIS — Z7984 Long term (current) use of oral hypoglycemic drugs: Secondary | ICD-10-CM | POA: Insufficient documentation

## 2019-10-21 DIAGNOSIS — I10 Essential (primary) hypertension: Secondary | ICD-10-CM

## 2019-10-21 DIAGNOSIS — R0789 Other chest pain: Secondary | ICD-10-CM | POA: Insufficient documentation

## 2019-10-21 LAB — BASIC METABOLIC PANEL
Anion gap: 10 (ref 5–15)
BUN: 16 mg/dL (ref 8–23)
CO2: 26 mmol/L (ref 22–32)
Calcium: 9.8 mg/dL (ref 8.9–10.3)
Chloride: 104 mmol/L (ref 98–111)
Creatinine, Ser: 1.09 mg/dL — ABNORMAL HIGH (ref 0.44–1.00)
GFR calc Af Amer: 58 mL/min — ABNORMAL LOW (ref >60)
GFR calc non Af Amer: 50 mL/min — ABNORMAL LOW (ref >60)
Glucose, Bld: 153 mg/dL — ABNORMAL HIGH (ref 70–99)
Potassium: 4 mmol/L (ref 3.5–5.1)
Sodium: 140 mmol/L (ref 135–145)

## 2019-10-21 LAB — CBC
HCT: 42.9 % (ref 36.0–46.0)
Hemoglobin: 13.4 g/dL (ref 12.0–15.0)
MCH: 29.6 pg (ref 26.0–34.0)
MCHC: 31.2 g/dL (ref 30.0–36.0)
MCV: 94.7 fL (ref 80.0–100.0)
Platelets: 259 10*3/uL (ref 150–400)
RBC: 4.53 MIL/uL (ref 3.87–5.11)
RDW: 13.5 % (ref 11.5–15.5)
WBC: 7 10*3/uL (ref 4.0–10.5)
nRBC: 0 % (ref 0.0–0.2)

## 2019-10-21 LAB — TROPONIN I (HIGH SENSITIVITY)
Troponin I (High Sensitivity): <2 ng/L (ref <18)
Troponin I (High Sensitivity): 3 ng/L (ref <18)

## 2019-10-21 MED ORDER — ASPIRIN 81 MG PO CHEW: 324.0000 mg | CHEWABLE_TABLET | Freq: Once | ORAL | Status: DC

## 2019-10-21 MED ORDER — LIDOCAINE 5 % EX PTCH
1.0000 | MEDICATED_PATCH | CUTANEOUS | Status: DC
  Administered 2019-10-21: 1 via TRANSDERMAL
  Filled 2019-10-21: qty 1

## 2019-10-21 MED ORDER — LIDOCAINE 5 % EX PTCH: 1.0000 | MEDICATED_PATCH | CUTANEOUS | 0 refills | Status: DC

## 2019-10-21 MED ORDER — ACETAMINOPHEN 500 MG PO TABS: 500.0000 mg | ORAL_TABLET | Freq: Four times a day (QID) | ORAL | 0 refills | Status: DC | PRN

## 2019-10-21 MED ORDER — IOHEXOL 350 MG/ML SOLN
100.0000 mL | Freq: Once | INTRAVENOUS | Status: AC | PRN
  Administered 2019-10-21: 100 mL via INTRAVENOUS

## 2019-10-21 MED ORDER — KETOROLAC TROMETHAMINE 30 MG/ML IJ SOLN
15.0000 mg | Freq: Once | INTRAMUSCULAR | Status: AC
  Administered 2019-10-21: 15 mg via INTRAVENOUS
  Filled 2019-10-21: qty 1

## 2019-10-21 NOTE — Discharge Instructions (Addendum)
You can take 1 to 2 tablets of Tylenol (350mg -1000mg  depending on the dose) every 6 hours as needed for pain.  Do not exceed 4000 mg of Tylenol daily.  If your pain persists you can take a dose of ibuprofen in between doses of Tylenol.  I usually recommend 400mg  of ibuprofen every 6 hours.  Take this with food to avoid upset stomach issues. Apply lidocaine patches to areas of pain as needed.  You can also apply heat 20 minutes a time 3-4 times daily and do some gentle stretching to avoid muscle stiffness.  Follow-up with your primary care provider or cardiologist for reevaluation of symptoms.  There is no evidence of heart attack on your work-up today.  Your thoracic aortic aneurysm is stable in size.  Return to the emergency department if any concerning signs or symptoms develop such as fevers, severe pain, shortness of breath, persistent vomiting, or loss of consciousness.

## 2019-10-21 NOTE — ED Triage Notes (Signed)
Patient sent down from UC c/o left sided chest pain onset of Saturday. Pain starts in the middle and radiates into the left, describes it as a hammer hitting her chest.

## 2019-10-21 NOTE — ED Notes (Signed)
Patient transported to CT 

## 2019-10-21 NOTE — ED Triage Notes (Signed)
Patient presents to Urgent Care with complaints of chest pain since 2 days ago. Patient reports she was diagnosed w/ an aortic aneurysm two years ago.

## 2019-10-21 NOTE — ED Provider Notes (Signed)
[facility]   MRN: [id number] DOB: 03-Aug-1946  Subjective:   [name] [name] is a 73 y.o. female presenting for 2-day history of acute onset recurrent now constant moderate to severe mid/lower to left-sided chest pain.  She has intermittent sharp pains.  Has significant risk factors including chronic heart failure, thoracic aortic aneurysm, history of CVA.  Patient has not tried any medications for relief, states that she is compliant with all her medications.  She has a cardiologist that she did not contact prior to coming to the hospital.  Denies smoking cigarettes.  No current facility-administered medications for this encounter.   Current Outpatient Medications:  .  albuterol (PROVENTIL HFA;VENTOLIN HFA) 108 (90 BASE) MCG/ACT inhaler, Inhale 1-2 puffs into the lungs every 6 (six) hours as needed for wheezing., Disp: 1 Inhaler, Rfl: 0 .  aspirin EC 81 MG tablet, Take 81 mg by mouth daily., Disp: , Rfl:  .  Cholecalciferol (VITAMIN D3) 1000 units CAPS, Take 1 capsule by mouth daily., Disp: , Rfl:  .  linagliptin (TRADJENTA) 5 MG TABS tablet, Take 5 mg by mouth daily., Disp: , Rfl:  .  lisinopril-hydrochlorothiazide (PRINZIDE,ZESTORETIC) 10-12.5 MG tablet, TAKE 1 TABLET BY MOUTH DAILY (Patient taking differently: Take 1 tablet by mouth daily. ), Disp: 90 tablet, Rfl: 0 .  metFORMIN (GLUCOPHAGE-XR) 500 MG 24 hr tablet, Take 500 mg by mouth daily with supper., Disp: , Rfl: 3 .  metoprolol succinate (TOPROL-XL) 50 MG 24 hr tablet, TAKE 1 TABLET BY MOUTH DAILY WITH OR IMMEDIATELY FOLLOWING A MEAL (Patient taking differently: Take 50 mg by mouth daily. ), Disp: 15 tablet, Rfl: 0 .  Olopatadine HCl 0.2 % SOLN, Place 1 drop into both eyes every evening., Disp: , Rfl:  .  omeprazole (PRILOSEC) 40 MG capsule, Take 40 mg by mouth daily., Disp: , Rfl:  .  Polyethyl Glycol-Propyl Glycol (SYSTANE OP), Apply 1 drop to eye 3 (three) times daily., Disp: , Rfl:  .  rosuvastatin (CRESTOR) 10 MG  tablet, Take 1 tablet (10 mg total) by mouth at bedtime., Disp: 30 tablet, Rfl: 11 .  spironolactone (ALDACTONE) 25 MG tablet, Take 12.5 mg by mouth daily., Disp: , Rfl:    Allergies  Allergen Reactions  . Adhesive [Tape] Rash    pls use elastic wraps instead of adhesive tape  . Codeine Nausea And Vomiting  . Empagliflozin Other (See Comments)    Headaches & lower back pain    Past Medical History:  Diagnosis Date  . Arthritis   . Bronchitis    hx  . Chest pain 2009   MV with no scar or ischemia, EF 65%  . Complication of anesthesia    claustrophobic !  . Diabetes mellitus   . GERD (gastroesophageal reflux disease)   . History of cardiac catheterization    a. Myoview 2/15 with anteroapical ischemia, EF 64% (intermediate risk) >> LHC - normal cos, EF 55-60%  . Hypertension   . Neuromuscular disorder ([place])    arotic Aneurysm  . Obesity (BMI 35.0-39.9 without comorbidity)   . Pneumonia   . Stroke [facility])    with some resid L sided weakness  . Thoracic ascending aortic aneurysm ([place])    a. CT 1/17: ascending aorta 4.2 x 4.0 cm     Past Surgical History:  Procedure Laterality Date  . ABDOMINAL HYSTERECTOMY  2006   Partial, one ovary left   . CARDIAC CATHETERIZATION  2004   [place], [place].; [place] per pt.  . CHOLECYSTECTOMY    .  EAR CYST EXCISION Left 09/20/2016   Procedure: EXCISION  LEFT EAR LESION;  Surgeon: Newman Pies, MD;  Location: [facility] OR;  Service: ENT;  Laterality: Left;  . LEFT HEART CATHETERIZATION WITH CORONARY ANGIOGRAM N/A 01/09/2014   Procedure: LEFT HEART CATHETERIZATION WITH CORONARY ANGIOGRAM;  Surgeon: Marykay Lex, MD;  Location: [facility] CATH LAB;  Service: Cardiovascular;  Laterality: N/A;  . SKIN SPLIT GRAFT Left 09/20/2016   Procedure: SKIN GRAFT SPLIT THICKNESS;  Surgeon: Newman Pies, MD;  Location: [facility] OR;  Service: ENT;  Laterality: Left;  . TEMPOROMANDIBULAR JOINT SURGERY    . TONSILLECTOMY    . WISDOM TOOTH EXTRACTION      Family History  Problem Relation Age  of Onset  . Heart disease Father   . Valvular heart disease Father        aortic valve replacement  . Pancreatitis Mother   . Diabetes Maternal Grandmother   . Heart disease Brother   . Valvular heart disease Brother        aortic valve replacement  . CAD Son        heart attack  . Kidney disease Son     Social History   Tobacco Use  . Smoking status: Never Smoker  . Smokeless tobacco: Never Used  Substance Use Topics  . Alcohol use: No  . Drug use: No    Review of Systems  Constitutional: Negative for fever and malaise/fatigue.  HENT: Negative for congestion, ear pain, sinus pain and sore throat.   Eyes: Negative for discharge and redness.  Respiratory: Negative for cough, hemoptysis, shortness of breath and wheezing.   Cardiovascular: Negative for chest pain.  Gastrointestinal: Negative for abdominal pain, diarrhea, nausea and vomiting.  Genitourinary: Negative for dysuria, flank pain and hematuria.  Musculoskeletal: Negative for myalgias.  Skin: Negative for rash.  Neurological: Negative for dizziness, weakness and headaches.  Psychiatric/Behavioral: Negative for depression and substance abuse.     Objective:   Vitals: BP (!) 155/95 (BP Location: Right Arm)   Pulse 84   Temp 98.3 F (36.8 C) (Oral)   Resp 19   SpO2 97%   Physical Exam Constitutional:      General: She is not in acute distress.    Appearance: Normal appearance. She is well-developed. She is obese. She is ill-appearing. She is not toxic-appearing or diaphoretic.  HENT:     Head: Normocephalic and atraumatic.     Right Ear: External ear normal.     Left Ear: External ear normal.     Nose: Nose normal.     Mouth/Throat:     Mouth: Mucous membranes are moist.     Pharynx: Oropharynx is clear.  Eyes:     General: No scleral icterus.    Extraocular Movements: Extraocular movements intact.     Pupils: Pupils are equal, round, and reactive to light.  Cardiovascular:     Rate and Rhythm:  Normal rate and regular rhythm.     Pulses: Normal pulses.     Heart sounds: Normal heart sounds. No murmur. No friction rub. No gallop.   Pulmonary:     Effort: Pulmonary effort is normal. No respiratory distress.     Breath sounds: Normal breath sounds. No stridor. No wheezing, rhonchi or rales.  Chest:     Chest wall: Tenderness (over area outlined) present.    Abdominal:     General: Bowel sounds are normal. There is no distension.     Palpations: Abdomen is soft. There is no  mass.     Tenderness: There is no abdominal tenderness. There is no right CVA tenderness, left CVA tenderness, guarding or rebound.  Skin:    General: Skin is warm and dry.     Coloration: Skin is not pale.     Findings: No rash.  Neurological:     General: No focal deficit present.     Mental Status: She is alert and oriented to person, place, and time.     Cranial Nerves: No cranial nerve deficit.     Motor: No weakness.     Coordination: Coordination normal.     Deep Tendon Reflexes: Reflexes normal.  Psychiatric:        Mood and Affect: Mood normal.        Behavior: Behavior normal.        Thought Content: Thought content normal.        Judgment: Judgment normal.     ED ECG REPORT   Date: 10/21/2019  Rate: 73bpm  Rhythm: normal sinus rhythm  QRS Axis: normal  Intervals: normal  ST/T Wave abnormalities: nonspecific T wave changes  Conduction Disutrbances:none  Narrative Interpretation: Nonspecific T wave flattening in lead V5, V6, aVF which is changed from her previous EKG in January 2020.  She also has T wave inversion in lead V4 which is also changed.  Old EKG Reviewed: changes noted  I have personally reviewed the EKG tracing and agree with the computerized printout as noted.   Assessment and Plan :   1. Unstable angina (HCC)   2. Essential hypertension   3. Elevated blood pressure reading   4. Thoracic aortic aneurysm without rupture (HCC)   5. Chronic congestive heart failure,  unspecified heart failure type (HCC)   6. Pulmonary HTN (HCC)     Patient has significant risk factors as above and severe chest pain.  I cannot adequately rule out cardiovascular event in the urgent care setting.  Redirected [facility] emergency room.  Patient contracted for safety, her daughter will transfer her there immediately.   Wallis BambergMani, Eli Adami, PA-C 10/21/19 1002

## 2019-10-21 NOTE — ED Notes (Signed)
Pt being transported to [facility] department.

## 2019-10-21 NOTE — Discharge Instructions (Signed)
Our nursing staff will transport you to the emergency room now as I am concerned that you may be having a heart event given your significant risk factors and severe chest pain, non-specific EKG changes.

## 2019-10-21 NOTE — ED Provider Notes (Signed)
[place] EMERGENCY DEPARTMENT Provider Note   CSN: [id number] Arrival date & time: 10/21/19  1002     History   Chief Complaint Chief Complaint  Patient presents with   Chest Pain    HPI [name] [name] is a 73 y.o. female with history of thoracic aortic aneurysm, CVA, obesity, hypertension, diabetes presents for evaluation of acute onset, progressively worsening chest pain for 4 days.  Reports that pain began in the epigastrium and has since radiated to the left side of the chest.  She notes a constant pressure with occasional sharp pains.  Pain does not radiate.  Pain is brought on with position changes, laying flat, sitting upright, ambulation.  Improves sitting still.  She notes some mild shortness of breath associated with this as well as nausea but no vomiting.  She felt lightheaded going from a laying to standing position this morning but no syncope, fevers, chills, cough, abdominal pain.  She has taken Tylenol without relief of symptoms.  She reports that she did move a piece of heavy furniture on Friday the day before her symptoms began.  She is a non-smoker, denies recreational drug use or alcohol intake.  She has a history of known thoracic aortic aneurysm which is being followed by her cardiologist Dr. Marlou Porch.  She had an echocardiogram on 07/05/2018 which showed stable aortic root dilatation.  She had a cath in 2015 which was clean.  Per chart review of virtual visit with Dr. Marlou Porch on 08/27/2019 she has complained of similar pains previously which are thought to be mostly musculoskeletal in etiology.  She was seen at urgent care earlier today was found to have an abnormal EKG was sent to the ED for further evaluation.  Denies recent travel or surgeries, no hemoptysis, no prior history of DVT or PE, not on hormone replacement therapy.     The history is provided by the patient.    Past Medical History:  Diagnosis Date   Arthritis    Bronchitis    hx    Chest pain 2009   MV with no scar or ischemia, EF 17%   Complication of anesthesia    claustrophobic !   Diabetes mellitus    GERD (gastroesophageal reflux disease)    History of cardiac catheterization    a. Myoview 2/15 with anteroapical ischemia, EF 64% (intermediate risk) >> LHC - normal cos, EF 55-60%   Hypertension    Neuromuscular disorder (HCC)    arotic Aneurysm   Obesity (BMI 35.0-39.9 without comorbidity)    Pneumonia    Stroke [facility])    with some resid L sided weakness   Thoracic ascending aortic aneurysm ([place])    a. CT 1/17: ascending aorta 4.2 x 4.0 cm    Patient Active Problem List   Diagnosis Date Noted   Chronic sinusitis    Complicated migraine    Pure hypercholesterolemia    TIA (transient ischemic attack)    Atypical chest pain    Cerebral infarction ([place]) 08/26/2016   Chronic diastolic heart failure, NYHA class 2 ([place]) 12/07/2015   Pulmonary HTN ([place]) 12/07/2015   Chronic chest pain 12/07/2015   Thoracic aortic aneurysm ([place]) 12/07/2015   Left sided numbness 12/07/2015   Bradycardia 05/06/2012   CVA (cerebral vascular accident) ([place]) 05/05/2012   Obesity due to excess calories 04/25/2012   Cystocele 03/22/2012   Type 2 diabetes mellitus without complication, without long-term current use of insulin ([place]) 03/22/2012   Essential hypertension 03/22/2012  gerd 03/22/2012    Past Surgical History:  Procedure Laterality Date   ABDOMINAL HYSTERECTOMY  2006   Partial, one ovary left    CARDIAC CATHETERIZATION  2004   [place] per pt.   CHOLECYSTECTOMY     EAR CYST EXCISION Left 09/20/2016   Procedure: EXCISION  LEFT EAR LESION;  Surgeon: Newman Pies, MD;  Location: [facility] OR;  Service: ENT;  Laterality: Left;   LEFT HEART CATHETERIZATION WITH CORONARY ANGIOGRAM N/A 01/09/2014   Procedure: LEFT HEART CATHETERIZATION WITH CORONARY ANGIOGRAM;  Surgeon: Marykay Lex, MD;  Location: [facility] CATH LAB;  Service: Cardiovascular;   Laterality: N/A;   SKIN SPLIT GRAFT Left 09/20/2016   Procedure: SKIN GRAFT SPLIT THICKNESS;  Surgeon: Newman Pies, MD;  Location: [facility] OR;  Service: ENT;  Laterality: Left;   TEMPOROMANDIBULAR JOINT SURGERY     TONSILLECTOMY     WISDOM TOOTH EXTRACTION       OB History    Gravida  4   Para  4   Term  4   Preterm  0   AB  0   Living  4     SAB  0   TAB  0   Ectopic  0   Multiple  0   Live Births           Obstetric Comments  Delivered 4 large babies vaginally         Home Medications    Prior to Admission medications   Medication Sig Start Date End Date Taking? Authorizing Provider  acetaminophen (TYLENOL) 500 MG tablet Take 1 tablet (500 mg total) by mouth every 6 (six) hours as needed. 10/21/19   Schelly Chuba A, PA-C  albuterol (PROVENTIL HFA;VENTOLIN HFA) 108 (90 BASE) MCG/ACT inhaler Inhale 1-2 puffs into the lungs every 6 (six) hours as needed for wheezing. 07/29/13   Reuben Likes, MD  aspirin EC 81 MG tablet Take 81 mg by mouth daily.    [provider]  Cholecalciferol (VITAMIN D3) 1000 units CAPS Take 1 capsule by mouth daily.    [provider]  lidocaine (LIDODERM) 5 % Place 1 patch onto the skin daily. Remove & Discard patch within 12 hours or as directed by MD 10/21/19   Michela Pitcher A, PA-C  linagliptin (TRADJENTA) 5 MG TABS tablet Take 5 mg by mouth daily.    [provider]  lisinopril-hydrochlorothiazide (PRINZIDE,ZESTORETIC) 10-12.5 MG tablet TAKE 1 TABLET BY MOUTH DAILY Patient taking differently: Take 1 tablet by mouth daily.  12/27/15   Allie Bossier, MD  metFORMIN (GLUCOPHAGE-XR) 500 MG 24 hr tablet Take 500 mg by mouth daily with supper. 04/23/17   [provider]  metoprolol succinate (TOPROL-XL) 50 MG 24 hr tablet TAKE 1 TABLET BY MOUTH DAILY WITH OR IMMEDIATELY FOLLOWING A MEAL Patient taking differently: Take 50 mg by mouth daily.  07/16/17   Tereso Newcomer T, PA-C  Olopatadine HCl 0.2 % SOLN Place 1 drop into  both eyes every evening.    [provider]  omeprazole (PRILOSEC) 40 MG capsule Take 40 mg by mouth daily.    [provider]  Polyethyl Glycol-Propyl Glycol (SYSTANE OP) Apply 1 drop to eye 3 (three) times daily.    [provider]  rosuvastatin (CRESTOR) 10 MG tablet Take 1 tablet (10 mg total) by mouth at bedtime. 08/27/19   Jake Bathe, MD  spironolactone (ALDACTONE) 25 MG tablet Take 12.5 mg by mouth daily.    [provider]    Family History Family History  Problem Relation Age of Onset   Heart disease Father    Valvular heart disease Father        aortic valve replacement   Pancreatitis Mother    Diabetes Maternal Grandmother    Heart disease Brother    Valvular heart disease Brother        aortic valve replacement   CAD Son        heart attack   Kidney disease Son     Social History Social History   Tobacco Use   Smoking status: Never Smoker   Smokeless tobacco: Never Used  Substance Use Topics   Alcohol use: No   Drug use: No     Allergies   Adhesive [tape], Codeine, and Empagliflozin   Review of Systems Review of Systems  Constitutional: Negative for chills, diaphoresis and fever.  Respiratory: Positive for shortness of breath. Negative for cough.   Cardiovascular: Positive for chest pain. Negative for leg swelling.  Gastrointestinal: Positive for nausea. Negative for abdominal pain and vomiting.  All other systems reviewed and are negative.    Physical Exam Updated Vital Signs BP (!) 150/95    Pulse 78    Temp 98.6 F (37 C) (Oral)    Resp (!) 24    Ht  (1.6 m)    Wt 83 kg    SpO2 97%    BMI 32.42 kg/m   Physical Exam Vitals signs and nursing note reviewed.  Constitutional:      General: She is not in acute distress.    Appearance: She is well-developed.  HENT:     Head: Normocephalic and atraumatic.  Eyes:     General:        Right eye: No discharge.        Left eye: No discharge.      Conjunctiva/sclera: Conjunctivae normal.  Neck:     Musculoskeletal: Normal range of motion and neck supple.     Vascular: No JVD.     Trachea: No tracheal deviation.  Cardiovascular:     Rate and Rhythm: Normal rate and regular rhythm.     Pulses:          Radial pulses are 2+ on the right side and 2+ on the left side.       Dorsalis pedis pulses are 2+ on the right side and 2+ on the left side.       Posterior tibial pulses are 2+ on the right side and 2+ on the left side.  Pulmonary:     Effort: Pulmonary effort is normal.     Breath sounds: Normal breath sounds.  Chest:     Chest wall: Tenderness present.       Comments: Tenderness to palpation of left anterior chest wall parasternally with no deformity, crepitus, ecchymosis, or flail segment Abdominal:     General: Bowel sounds are normal. There is no distension.     Palpations: Abdomen is soft.     Tenderness: There is no abdominal tenderness. There is no guarding or rebound.  Musculoskeletal:     Right lower leg: She exhibits no tenderness. No edema.     Left lower leg: She exhibits no tenderness. No edema.  Skin:    General: Skin is warm and dry.     Findings: No erythema.  Neurological:     Mental Status: She is alert.  Psychiatric:        Behavior:  Behavior normal.      ED Treatments / Results  Labs (all labs ordered are listed, but only abnormal results are displayed) Labs Reviewed  BASIC METABOLIC PANEL - Abnormal; Notable for the following components:      Result Value   Glucose, Bld 153 (*)    Creatinine, Ser 1.09 (*)    GFR calc non Af Amer 50 (*)    GFR calc Af Amer 58 (*)    All other components within normal limits  CBC  TROPONIN I (HIGH SENSITIVITY)  TROPONIN I (HIGH SENSITIVITY)    EKG EKG Interpretation  Date/Time:  Tuesday October 21 2019 10:06:37 EST Ventricular Rate:  70 PR Interval:  154 QRS Duration: 86 QT Interval:  408 QTC Calculation: 440 R Axis:   -32 Text  Interpretation: Normal sinus rhythm Left axis deviation ST & T wave abnormality, consider anterior ischemia Abnormal ECG Confirmed by Benjiman Core [phone]) on 10/21/2019 11:12:28 AM   Radiology Dg Chest 2 View  Result Date: 10/21/2019 CLINICAL DATA:  Chest pain. EXAM: CHEST - 2 VIEW COMPARISON:  December 15, 2018. FINDINGS: The heart size and mediastinal contours are within normal limits. Both lungs are clear. No pneumothorax or pleural effusion is noted. The visualized skeletal structures are unremarkable. IMPRESSION: No active cardiopulmonary disease. Electronically Signed   By: Lupita Raider M.D.   On: 10/21/2019 10:45   Ct Angio Chest/abd/pel For Dissection W And/or Wo Contrast  Result Date: 10/21/2019 CLINICAL DATA:  Chest pain for several days, follow-up ascending aortic aneurysm EXAM: CT ANGIOGRAPHY CHEST, ABDOMEN AND PELVIS TECHNIQUE: Multidetector CT imaging through the chest, abdomen and pelvis was performed using the standard protocol during bolus administration of intravenous contrast. Multiplanar reconstructed images and MIPs were obtained and reviewed to evaluate the vascular anatomy. CONTRAST:  OMNIPAQUE IOHEXOL 350 MG/ML SOLN COMPARISON:  06/19/2017 FINDINGS: CTA CHEST FINDINGS Cardiovascular: Initial precontrast images show no hyperdense crescent to suggest intramural hematoma. Thoracic aorta is well visualized within normal branching pattern. The ascending aorta again is mildly dilated at 4.1 cm. No significant coronary calcifications are noted. Aortic valve demonstrates no significant atherosclerotic calcifications. No dissection is seen. The descending thoracic aorta is mildly tortuous but otherwise within normal limits. The pulmonary artery as visualized shows no pulmonary embolism Mediastinum/Nodes: Thoracic inlet is within normal limits. No hilar or mediastinal adenopathy is noted. The esophagus as visualized is unremarkable. Lungs/Pleura: Lungs are well aerated  bilaterally. Mild dependent atelectatic changes are seen. No focal effusion or infiltrate is noted. No parenchymal nodules are seen. Musculoskeletal: Degenerative changes of the thoracic spine are noted. No acute rib abnormality is seen. No compression deformities are noted. Review of the MIP images confirms the above findings. CTA ABDOMEN AND PELVIS FINDINGS VASCULAR Aorta: Abdominal aorta demonstrates mild atherosclerotic calcifications without aneurysmal dilatation. Celiac: Patent without evidence of aneurysm, dissection, vasculitis or significant stenosis. SMA: Patent without evidence of aneurysm, dissection, vasculitis or significant stenosis. Renals: Both renal arteries are patent without evidence of aneurysm, dissection, vasculitis, fibromuscular dysplasia or significant stenosis. IMA: Patent without evidence of aneurysm, dissection, vasculitis or significant stenosis. Iliacs: Iliacs are mildly calcified without aneurysmal dilatation or dissection. Veins: No specific venous abnormality is noted. Review of the MIP images confirms the above findings. NON-VASCULAR Hepatobiliary: No focal liver abnormality is seen. Status post cholecystectomy. No biliary dilatation. Pancreas: Unremarkable. No pancreatic ductal dilatation or surrounding inflammatory changes. Spleen: Normal in size without focal abnormality. Adrenals/Urinary Tract: Adrenal glands are within normal limits. Kidneys demonstrate a normal  enhancement pattern bilaterally. No obstructive changes are seen. No definitive calculi are identified. The ureters are within normal limits. The bladder is partially distended. Stomach/Bowel: Scattered diverticular change of the colon is noted without evidence of diverticulitis. The appendix is within normal limits. No small bowel dilatation is noted. Lymphatic: No sizable lymphadenopathy is noted Reproductive: Prostate is unremarkable. Other: No abdominal wall hernia or abnormality. No abdominopelvic ascites.  Musculoskeletal: Mild degenerative changes of lumbar spine are noted. Review of the MIP images confirms the above findings. IMPRESSION: CTA of the chest: Stable dilatation of the ascending aorta 4.1 cm. No findings to suggest dissection are seen. No new focal abnormality is noted. CTA of the abdomen and pelvis: No aortic abnormality is noted. Scattered chronic changes as described above. Aortic Atherosclerosis (ICD10-I70.0). Electronically Signed   By: Alcide Clever M.D.   On: 10/21/2019 12:49    Procedures Procedures (including critical care time)  Medications Ordered in ED Medications  lidocaine (LIDODERM) 5 % 1 patch (1 patch Transdermal Patch Applied 10/21/19 1329)  iohexol (OMNIPAQUE) 350 MG/ML injection 100 mL (100 mLs Intravenous Contrast Given 10/21/19 1233)  ketorolac (TORADOL) 30 MG/ML injection 15 mg (15 mg Intravenous Given 10/21/19 1329)     Initial Impression / Assessment and Plan / ED Course  I have reviewed the triage vital signs and the nursing notes.  Pertinent labs & imaging results that were available during my care of the patient were reviewed by me and considered in my medical decision making (see chart for details).        Patient presenting for evaluation of left-sided chest pains for 4 days.  She is afebrile, mildly hypertensive intermittently in the ED vital signs overall stable.  She is nontoxic in appearance.  The pain is reproducible on palpation she is exquisitely tender to palpation along the left parasternal region suggesting musculoskeletal component of her symptoms.  However she does have risk factors for ACS/MI and her EKG is not entirely normal.  We will obtain blood work, serial troponins, chest x-ray and reassess.  Lab work reviewed by me shows no leukocytosis, no anemia, no metabolic derangements, mild elevation in creatinine but BUN is within normal limits.  Serial troponins are negative.  Low suspicion of ACS/MI at this time.  Chest x-ray shows no acute  cardiopulmonary abnormalities.  We did obtain a CTA of the chest abdomen and pelvis to assess for stability in her known thoracic aortic aneurysm given her history and the fact that she has not had any recent imaging for this.  This shows stable dilatation of the ascending aorta at 4.1 cm with no findings to suggest dissection.  Doubt cardiac tamponade, esophageal rupture, pneumonia, pneumothorax.  Her abdomen is soft and nontender, doubt acute surgical abdominal pathology.  On reevaluation patient resting comfortably no apparent distress.  Again suspect musculoskeletal etiology of her symptoms.  Recommend Tylenol, lidocaine patches, heat.  Recommend follow-up with PCP or cardiology for reevaluation of symptoms.  Discussed strict ED return precautions. Patient verbalized understanding of and agreement with plan and is safe for discharge home at this time.  Discussed with Dr. Rubin Payor who agrees with assessment and plan at this time.  Final Clinical Impressions(s) / ED Diagnoses   Final diagnoses:  Atypical chest pain  Chest wall pain    ED Discharge Orders         Ordered    lidocaine (LIDODERM) 5 %  Every 24 hours     10/21/19 1333    acetaminophen (TYLENOL)  500 MG tablet  Every 6 hours PRN     10/21/19 1333           Bennye AlmFawze, Hadley Detloff A, PA-C 10/21/19 1339    Benjiman CorePickering, Nathan, MD 10/21/19 [phone]

## 2020-01-22 DIAGNOSIS — M75122 Complete rotator cuff tear or rupture of left shoulder, not specified as traumatic: Secondary | ICD-10-CM | POA: Insufficient documentation

## 2020-02-06 ENCOUNTER — Other Ambulatory Visit: Payer: Self-pay | Admitting: Cardiology

## 2020-02-06 ENCOUNTER — Telehealth: Payer: Self-pay | Admitting: *Deleted

## 2020-02-06 ENCOUNTER — Encounter: Payer: Self-pay | Admitting: Cardiology

## 2020-02-06 ENCOUNTER — Telehealth: Payer: Self-pay | Admitting: Cardiology

## 2020-02-06 DIAGNOSIS — I712 Thoracic aortic aneurysm, without rupture, unspecified: Secondary | ICD-10-CM

## 2020-02-06 NOTE — Telephone Encounter (Signed)
Error

## 2020-02-06 NOTE — Telephone Encounter (Signed)
   Primary Cardiologist: Donato Schultz, MD  Chart reviewed and patient contacted by phone today as part of pre-operative protocol coverage. Given past medical history and time since last visit, based on ACC/AHA guidelines, [name] [name] would be at acceptable risk for the planned procedure without further cardiovascular testing.   In general we would prefer ASA 81mg  be continued in cardiac patients but if that is not possible would hold 3-5 days pre op and resume post op.   I will route this recommendation to the requesting party via Epic fax function and remove from pre-op pool.  Please call with questions.  , PA-C 02/06/2020, 10:29 AM

## 2020-02-06 NOTE — Telephone Encounter (Signed)
   [facility] Pre-operative Risk Assessment    Request for surgical clearance:  1. What type of surgery is being performed? LEFT SHOULDER SCOPE ROTATOR CUFF REPAIR   2. When is this surgery scheduled? 02/10/20   3. What type of clearance is required (medical clearance vs. Pharmacy clearance to hold med vs. Both)? MEDICAL  4. Are there any medications that need to be held prior to surgery and how long? ASA   5. Practice name and name of physician performing surgery? [facility]; DR. Hart Rochester III   6. What is your office phone number [phone]    7.   What is your office fax number [phone]  8.   Anesthesia type (None, local, MAC, general) ? NOT LISTED; LEFT MESSAGE TO CALL BACK WITH TYPE OF ANESTHESIA   Julaine Hua 02/06/2020, 9:40 AM  _________________________________________________________________   (provider comments below)

## 2020-02-06 NOTE — Telephone Encounter (Signed)
[facility] called back stated  The pt will have Shoulder with General anisticia   For this pt's procedure.

## 2020-02-06 NOTE — Telephone Encounter (Signed)
I spoke with Dr. Anne Fu regarding the question of an echocardiogram required preop.  We will see if this can be scheduled for Monday but he does not feel this should not hold up her surgery.  The echo was a routine follow-up for dilated aortic root.  I will forward a copy of this note to the operating surgeon.  Corine Shelter PA-C 02/06/2020 11:42 AM

## 2020-02-06 NOTE — Telephone Encounter (Signed)
[facility] called back and confirmed anesthesia to be used will be General

## 2020-02-09 ENCOUNTER — Ambulatory Visit (HOSPITAL_COMMUNITY)
Admission: RE | Admit: 2020-02-09 | Discharge: 2020-02-09 | Disposition: A | Discharge status: Home / Self Care | Payer: Medicare Other | Source: Ambulatory Visit | Attending: Cardiology | Admitting: Cardiology

## 2020-02-09 ENCOUNTER — Ambulatory Visit (HOSPITAL_COMMUNITY): Admission: RE | Admit: 2020-02-09 | Payer: Medicare Other | Source: Ambulatory Visit

## 2020-02-09 ENCOUNTER — Other Ambulatory Visit: Payer: Self-pay

## 2020-02-09 DIAGNOSIS — I712 Thoracic aortic aneurysm, without rupture, unspecified: Secondary | ICD-10-CM

## 2020-02-09 DIAGNOSIS — I10 Essential (primary) hypertension: Secondary | ICD-10-CM | POA: Insufficient documentation

## 2020-02-09 DIAGNOSIS — R079 Chest pain, unspecified: Secondary | ICD-10-CM | POA: Diagnosis not present

## 2020-02-09 DIAGNOSIS — Z8673 Personal history of transient ischemic attack (TIA), and cerebral infarction without residual deficits: Secondary | ICD-10-CM | POA: Diagnosis not present

## 2020-02-09 DIAGNOSIS — E785 Hyperlipidemia, unspecified: Secondary | ICD-10-CM | POA: Insufficient documentation

## 2020-02-09 DIAGNOSIS — I272 Pulmonary hypertension, unspecified: Secondary | ICD-10-CM | POA: Insufficient documentation

## 2020-02-09 NOTE — Progress Notes (Signed)
  Echocardiogram 2D Echocardiogram has been performed.  [name] [name] 02/09/2020, 2:21 PM

## 2020-05-02 ENCOUNTER — Emergency Department (HOSPITAL_COMMUNITY): Payer: Medicare Other

## 2020-05-02 ENCOUNTER — Encounter (HOSPITAL_COMMUNITY): Payer: Self-pay

## 2020-05-02 ENCOUNTER — Emergency Department (HOSPITAL_COMMUNITY)
Admission: EM | Admit: 2020-05-02 | Discharge: 2020-05-02 | Disposition: A | Discharge status: Home / Self Care | Payer: Medicare Other | Attending: Emergency Medicine | Admitting: Emergency Medicine

## 2020-05-02 ENCOUNTER — Other Ambulatory Visit: Payer: Self-pay

## 2020-05-02 DIAGNOSIS — I69354 Hemiplegia and hemiparesis following cerebral infarction affecting left non-dominant side: Secondary | ICD-10-CM | POA: Diagnosis not present

## 2020-05-02 DIAGNOSIS — Z7984 Long term (current) use of oral hypoglycemic drugs: Secondary | ICD-10-CM | POA: Insufficient documentation

## 2020-05-02 DIAGNOSIS — Z79899 Other long term (current) drug therapy: Secondary | ICD-10-CM | POA: Insufficient documentation

## 2020-05-02 DIAGNOSIS — I5032 Chronic diastolic (congestive) heart failure: Secondary | ICD-10-CM | POA: Diagnosis not present

## 2020-05-02 DIAGNOSIS — R110 Nausea: Secondary | ICD-10-CM | POA: Insufficient documentation

## 2020-05-02 DIAGNOSIS — R1013 Epigastric pain: Secondary | ICD-10-CM | POA: Diagnosis not present

## 2020-05-02 DIAGNOSIS — E119 Type 2 diabetes mellitus without complications: Secondary | ICD-10-CM | POA: Diagnosis not present

## 2020-05-02 DIAGNOSIS — K219 Gastro-esophageal reflux disease without esophagitis: Secondary | ICD-10-CM | POA: Diagnosis not present

## 2020-05-02 DIAGNOSIS — I712 Thoracic aortic aneurysm, without rupture: Secondary | ICD-10-CM | POA: Insufficient documentation

## 2020-05-02 DIAGNOSIS — R42 Dizziness and giddiness: Secondary | ICD-10-CM | POA: Insufficient documentation

## 2020-05-02 DIAGNOSIS — I110 Hypertensive heart disease with heart failure: Secondary | ICD-10-CM | POA: Diagnosis not present

## 2020-05-02 LAB — CBC
HCT: 42.6 % (ref 36.0–46.0)
Hemoglobin: 13.2 g/dL (ref 12.0–15.0)
MCH: 29.1 pg (ref 26.0–34.0)
MCHC: 31 g/dL (ref 30.0–36.0)
MCV: 94 fL (ref 80.0–100.0)
Platelets: 328 10*3/uL (ref 150–400)
RBC: 4.53 MIL/uL (ref 3.87–5.11)
RDW: 14.4 % (ref 11.5–15.5)
WBC: 7.1 10*3/uL (ref 4.0–10.5)
nRBC: 0 % (ref 0.0–0.2)

## 2020-05-02 LAB — URINALYSIS, ROUTINE W REFLEX MICROSCOPIC
Bilirubin Urine: NEGATIVE
Glucose, UA: NEGATIVE mg/dL
Hgb urine dipstick: NEGATIVE
Ketones, ur: NEGATIVE mg/dL
Leukocytes,Ua: NEGATIVE
Nitrite: NEGATIVE
Protein, ur: NEGATIVE mg/dL
Specific Gravity, Urine: 1.008 (ref 1.005–1.030)
pH: 5 (ref 5.0–8.0)

## 2020-05-02 LAB — COMPREHENSIVE METABOLIC PANEL
ALT: 9 U/L (ref 0–44)
AST: 17 U/L (ref 15–41)
Albumin: 3.7 g/dL (ref 3.5–5.0)
Alkaline Phosphatase: 88 U/L (ref 38–126)
Anion gap: 12 (ref 5–15)
BUN: 17 mg/dL (ref 8–23)
CO2: 21 mmol/L — ABNORMAL LOW (ref 22–32)
Calcium: 9.5 mg/dL (ref 8.9–10.3)
Chloride: 106 mmol/L (ref 98–111)
Creatinine, Ser: 1.1 mg/dL — ABNORMAL HIGH (ref 0.44–1.00)
GFR calc Af Amer: 57 mL/min — ABNORMAL LOW (ref >60)
GFR calc non Af Amer: 49 mL/min — ABNORMAL LOW (ref >60)
Glucose, Bld: 223 mg/dL — ABNORMAL HIGH (ref 70–99)
Potassium: 4.1 mmol/L (ref 3.5–5.1)
Sodium: 139 mmol/L (ref 135–145)
Total Bilirubin: 0.9 mg/dL (ref 0.3–1.2)
Total Protein: 7.2 g/dL (ref 6.5–8.1)

## 2020-05-02 LAB — LIPASE, BLOOD: Lipase: 39 U/L (ref 11–51)

## 2020-05-02 MED ORDER — SODIUM CHLORIDE 0.9% FLUSH
3.0000 mL | Freq: Once | INTRAVENOUS | Status: AC
  Administered 2020-05-02: 3 mL via INTRAVENOUS

## 2020-05-02 MED ORDER — MECLIZINE HCL 25 MG PO TABS
25.0000 mg | ORAL_TABLET | Freq: Once | ORAL | Status: AC
  Administered 2020-05-02: 25 mg via ORAL
  Filled 2020-05-02: qty 1

## 2020-05-02 MED ORDER — SODIUM CHLORIDE 0.9 % IV BOLUS
500.0000 mL | Freq: Once | INTRAVENOUS | Status: AC
  Administered 2020-05-02: 500 mL via INTRAVENOUS

## 2020-05-02 MED ORDER — FAMOTIDINE IN NACL 20-0.9 MG/50ML-% IV SOLN
20.0000 mg | Freq: Once | INTRAVENOUS | Status: AC
  Administered 2020-05-02: 20 mg via INTRAVENOUS
  Filled 2020-05-02: qty 50

## 2020-05-02 MED ORDER — ACETAMINOPHEN 500 MG PO TABS
1000.0000 mg | ORAL_TABLET | Freq: Once | ORAL | Status: AC
  Administered 2020-05-02: 1000 mg via ORAL
  Filled 2020-05-02: qty 2

## 2020-05-02 MED ORDER — METOCLOPRAMIDE HCL 5 MG/ML IJ SOLN
10.0000 mg | Freq: Once | INTRAMUSCULAR | Status: AC
  Administered 2020-05-02: 10 mg via INTRAVENOUS
  Filled 2020-05-02: qty 2

## 2020-05-02 NOTE — ED Provider Notes (Signed)
  Face-to-face evaluation   History: Presents for evaluation of dizziness, nausea, vomiting, headache and upper abdominal discomfort.  Onset of the symptoms, together, today.  History of prior vertigo, intermittently over the last several years.  Physical exam: Elderly, alert and cooperative.  She is mildly uncomfortable.  No nystagmus.  Head impulse testing she experiences vertigo.  Normal strength, bilaterally, arms and legs.  Medical screening examination/treatment/procedure(s) were conducted as a shared visit with non-physician practitioner(s) and myself.  I personally evaluated the patient during the encounter    Mancel Bale, MD 05/02/20 [phone]

## 2020-05-02 NOTE — ED Triage Notes (Signed)
Patient arrived by [facility] for abdominal pain with nausea and headache that started today. Patient denies diarrhea. Complains of cramping pain. Alert and oriented

## 2020-05-02 NOTE — Progress Notes (Signed)
Pt refusing MRI. Stated she was feeling better. Dizziness and nausea have subsided with meds given.

## 2020-05-02 NOTE — Discharge Instructions (Signed)
Please follow-up your primary care doctor.  Please return to ED for any new or concerning symptoms.  Please drink plenty of water.

## 2020-05-02 NOTE — ED Provider Notes (Signed)
[facility] EMERGENCY DEPARTMENT Provider Note   CSN: [id number] Arrival date & time: 05/02/20  1430     History No chief complaint on file.   [name] [name] is a 74 y.o. female.  HPI  Patient is a 74 year old female with past medical history of DM 2, reflux, hypertension, obesity, stroke with residual left-sided weakness aortic aneurysm being monitored by cardiology, arthritis  Patient presents to ED today for epigastric abdominal pain, nausea and dry heaving that began at approximately noon today.  She states that the nausea and dry heaving that she is having to be related to her dizziness.  She states that she has a history of vertiginous symptoms and states that these seem to come and go.  She states that she has had episodic dizziness today.  She denies any syncope.     Past Medical History:  Diagnosis Date  . Arthritis   . Bronchitis    hx  . Chest pain 2009   MV with no scar or ischemia, EF 65%  . Complication of anesthesia    claustrophobic !  . Diabetes mellitus   . GERD (gastroesophageal reflux disease)   . History of cardiac catheterization    a. Myoview 2/15 with anteroapical ischemia, EF 64% (intermediate risk) >> LHC - normal cos, EF 55-60%  . Hypertension   . Neuromuscular disorder (HCC)    arotic Aneurysm  . Obesity (BMI 35.0-39.9 without comorbidity)   . Pneumonia   . Stroke [facility])    with some resid L sided weakness  . Thoracic ascending aortic aneurysm (HCC)    a. CT 1/17: ascending aorta 4.2 x 4.0 cm    Patient Active Problem List   Diagnosis Date Noted  . Chronic sinusitis   . Complicated migraine   . Pure hypercholesterolemia   . TIA (transient ischemic attack)   . Atypical chest pain   . Cerebral infarction (HCC) 08/26/2016  . Chronic diastolic heart failure, NYHA class 2 (HCC) 12/07/2015  . Pulmonary HTN (HCC) 12/07/2015  . Chronic chest pain 12/07/2015  . Thoracic aortic aneurysm (HCC) 12/07/2015  . Left sided  numbness 12/07/2015  . Bradycardia 05/06/2012  . CVA (cerebral vascular accident) (HCC) 05/05/2012  . Obesity due to excess calories 04/25/2012  . Cystocele 03/22/2012  . Type 2 diabetes mellitus without complication, without long-term current use of insulin (HCC) 03/22/2012  . Essential hypertension 03/22/2012  . gerd 03/22/2012    Past Surgical History:  Procedure Laterality Date  . ABDOMINAL HYSTERECTOMY  2006   Partial, one ovary left   . CARDIAC CATHETERIZATION  2004   [place], [place] per pt.  . CHOLECYSTECTOMY    . EAR CYST EXCISION Left 09/20/2016   Procedure: EXCISION  LEFT EAR LESION;  Surgeon: Newman Pies, MD;  Location: [facility] OR;  Service: ENT;  Laterality: Left;  . LEFT HEART CATHETERIZATION WITH CORONARY ANGIOGRAM N/A 01/09/2014   Procedure: LEFT HEART CATHETERIZATION WITH CORONARY ANGIOGRAM;  Surgeon: Marykay Lex, MD;  Location[facility] C[facility];  Service: Cardiovascular;  Laterality: N/A;  . SKIN SPLIT GRAFT Left 09/20/2016   Procedure: SKIN GRAFT SPLIT THICKNESS;  Surgeon: Newman Pies, MD;  Location: [facility] OR;  Service: ENT;  Laterality: Left;  . TEMPOROMANDIBULAR JOINT SURGERY    . TONSILLECTOMY    . WISDOM TOOTH EXTRACTION       OB History    Gravida  4   Para  4   Term  4   Preterm  0  AB  0   Living  4     SAB  0   TAB  0   Ectopic  0   Multiple  0   Live Births           Obstetric Comments  Delivered 4 large babies vaginally        Family History  Problem Relation Age of Onset  . Heart disease Father   . Valvular heart disease Father        aortic valve replacement  . Pancreatitis Mother   . Diabetes Maternal Grandmother   . Heart disease Brother   . Valvular heart disease Brother        aortic valve replacement  . CAD Son        heart attack  . Kidney disease Son     Social History   Tobacco Use  . Smoking status: Never Smoker  . Smokeless tobacco: Never Used  Vaping Use  . Vaping Use: Never used  Substance Use Topics  .  Alcohol use: No  . Drug use: No    Home Medications Prior to Admission medications   Medication Sig Start Date End Date Taking? Authorizing Provider  acetaminophen (TYLENOL) 500 MG tablet Take 1 tablet (500 mg total) by mouth every 6 (six) hours as needed. 10/21/19   Fawze, Mina A, PA-C  albuterol (PROVENTIL HFA;VENTOLIN HFA) 108 (90 BASE) MCG/ACT inhaler Inhale 1-2 puffs into the lungs every 6 (six) hours as needed for wheezing. 07/29/13   Harden Mo, MD  aspirin EC 81 MG tablet Take 81 mg by mouth daily.    [provider]  Cholecalciferol (VITAMIN D3) 1000 units CAPS Take 1 capsule by mouth daily.    [provider]  lidocaine (LIDODERM) 5 % Place 1 patch onto the skin daily. Remove & Discard patch within 12 hours or as directed by MD 10/21/19   Rodell Perna A, PA-C  linagliptin (TRADJENTA) 5 MG TABS tablet Take 5 mg by mouth daily.    [provider]  lisinopril-hydrochlorothiazide (PRINZIDE,ZESTORETIC) 10-12.5 MG tablet TAKE 1 TABLET BY MOUTH DAILY Patient taking differently: Take 1 tablet by mouth daily.  12/27/15   Emily Filbert, MD  metFORMIN (GLUCOPHAGE-XR) 500 MG 24 hr tablet Take 500 mg by mouth daily with supper. 04/23/17   [provider]  metoprolol succinate (TOPROL-XL) 50 MG 24 hr tablet TAKE 1 TABLET BY MOUTH DAILY WITH OR IMMEDIATELY FOLLOWING A MEAL Patient taking differently: Take 50 mg by mouth daily.  07/16/17   Richardson Dopp T, PA-C  Olopatadine HCl 0.2 % SOLN Place 1 drop into both eyes every evening.    [provider]  omeprazole (PRILOSEC) 40 MG capsule Take 40 mg by mouth daily.    [provider]  Polyethyl Glycol-Propyl Glycol (SYSTANE OP) Apply 1 drop to eye 3 (three) times daily.    [provider]  rosuvastatin (CRESTOR) 10 MG tablet Take 1 tablet (10 mg total) by mouth at bedtime. 08/27/19   Jerline Pain, MD  spironolactone (ALDACTONE) 25 MG tablet Take 12.5 mg by mouth daily.    [provider]     Allergies    Adhesive [tape], Codeine, and Empagliflozin  Review of Systems   Review of Systems  Constitutional: Positive for fatigue. Negative for chills and fever.  HENT: Negative for congestion.   Eyes: Negative for pain.  Respiratory: Negative for cough and shortness of breath.   Cardiovascular: Negative for chest pain and  leg swelling.  Gastrointestinal: Positive for abdominal pain and nausea. Negative for vomiting.       Dry heaving   Genitourinary: Negative for dysuria.  Musculoskeletal: Negative for myalgias.  Skin: Negative for rash.  Neurological: Positive for dizziness and light-headedness. Negative for seizures, syncope and headaches.    Physical Exam Updated Vital Signs BP 121/90 (BP Location: Right Arm)   Pulse 88   Temp 97.6 F (36.4 C) (Axillary)   Resp 20   Ht 5\' 3"  (1.6 m)   Wt 81.6 kg   SpO2 100%   BMI 31.89 kg/m   Physical Exam Vitals and nursing note reviewed.  Constitutional:      General: She is not in acute distress.    Comments: Patient 74 year old female appears stated age.  In no acute distress sitting comfortably in bed.  HENT:     Head: Normocephalic and atraumatic.     Nose: Nose normal.  Eyes:     General: No scleral icterus. Cardiovascular:     Rate and Rhythm: Normal rate and regular rhythm.     Pulses: Normal pulses.     Heart sounds: Normal heart sounds.  Pulmonary:     Effort: Pulmonary effort is normal. No respiratory distress.     Breath sounds: No wheezing.  Abdominal:     Palpations: Abdomen is soft.     Tenderness: There is no abdominal tenderness. There is no right CVA tenderness, left CVA tenderness, guarding or rebound.  Musculoskeletal:     Cervical back: Normal range of motion and neck supple. No tenderness.     Right lower leg: No edema.     Left lower leg: No edema.  Skin:    General: Skin is warm and dry.     Capillary Refill: Capillary refill takes less than 2 seconds.  Neurological:     Mental Status:  She is alert. Mental status is at baseline.     Comments: Right UE with 5/5 strength, left UE with 4/5 strength Bilateral lower extremities with 5/5 strength CN intact PERRLI, EOMI Sensation intact all 4 extremities Patient slightly unsteady when standing  Normal gait Good coordination finger/nose; heel/shin  Psychiatric:        Mood and Affect: Mood normal.        Behavior: Behavior normal.     ED Results / Procedures / Treatments   Labs (all labs ordered are listed, but only abnormal results are displayed) Labs Reviewed  COMPREHENSIVE METABOLIC PANEL - Abnormal; Notable for the following components:      Result Value   CO2 21 (*)    Glucose, Bld 223 (*)    Creatinine, Ser 1.10 (*)    GFR calc non Af Amer 49 (*)    GFR calc Af Amer 57 (*)    All other components within normal limits  LIPASE, BLOOD  CBC  URINALYSIS, ROUTINE W REFLEX MICROSCOPIC    EKG None  Radiology No results found.  Procedures Procedures (including critical care time)  Medications Ordered in ED Medications  sodium chloride flush (NS) 0.9 % injection 3 mL (3 mLs Intravenous Given 05/02/20 1706)  sodium chloride 0.9 % bolus 500 mL (500 mLs Intravenous New Bag/Given 05/02/20 1705)  meclizine (ANTIVERT) tablet 25 mg (25 mg Oral Given 05/02/20 1702)  famotidine (PEPCID) IVPB 20 mg premix (20 mg Intravenous New Bag/Given 05/02/20 1703)  acetaminophen (TYLENOL) tablet 1,000 mg (1,000 mg Oral Given 05/02/20 1702)  metoCLOPramide (REGLAN) injection 10 mg (10 mg Intravenous Given 05/02/20  [facility]ird[facility])    ED Course  I have reviewed the triage vital signs and the nursing notes.  Pertinent labs & imaging results that were available during my care of the patient were reviewed by me and considered in my medical decision making (see chart for details).  Patient is well-appearing 74 year old female with past medical history detailed above presented today for intermittent dizziness/vertigo which began today around noon.   She states this is an old problem however it seems worse today than before.  She denies any vomiting but states she is felt nauseous and had some dry heaves.  She received some nausea medication by EMS prior to arrival and states she does not feel nauseous currently.  Physical exam is notable for no abdominal tenderness but she does point to her epigastrium when asked about pain.  She is neurologically intact although she has some decrease strength in her left arm secondary to shoulder surgery and old stroke.  Vital signs within normal limits.  Low suspicion for central cerebellar stroke however as it cannot be ruled out will obtain MRI.  I discussed this with my attending physician Dr. Effie Shy who assessed patient at bedside.  High suspicion for vertigo as it does seem to have some positional properties.  Clinical Course as of May 02 1936  Sun May 02, 2020  1650 CBC without leukocytosis or anemia.   [WF]  1650 CMP with elevated glucose 223 with no anion gap, CO2 21.  No acute abnormalities.  No evidence of DKA or HHS   [WF]  1651 Lipase within normal limits doubt pancreatitis.   [WF]  1651 Urinalysis benign; no evidence of infection   [WF]  1934 Patient refusing MRI at this time.  I informed patient that the risks of not getting an MRI include missing a stroke which may result in long-term debilitation, death, loss of speech, sensation or strength.  She is understanding of this and would like to be discharged at this time as she states she feels completely better and does not want to have an MRI done.  I discussed this case with my attending physician who cosigned this note including patient's presenting symptoms, physical exam, and planned diagnostics and interventions. Attending physician stated agreement with plan or made changes to plan which were implemented.   Attending physician assessed patient at bedside   [WF]    Clinical Course User Index [WF] Gailen Shel[place], [place]   Patient  discharged with return precautions and close follow-up with her PCP.  She will return to ED for any new or concerning symptoms.  She understands that if she is having a stroke we are unable to check this without an MRI.  Patient may also just have some orthostatic lightheadedness.  Her blood pressure is tightly controlled due to her aortic aneurysm which may be causing some hypotension positionally.  She is well-appearing, standing ambulatory and without any dizziness or symptoms at this time.  She is requesting to be sent home.  I suspect that her epigastric abdominal pain is secondary to reflux.  She is given famotidine and her symptoms completely resolved.  She continues to have no abdominal tenderness on my exam.  MDM Rules/Calculators/A&P                           Final Clinical Impression(s) / ED Diagnoses Final diagnoses:  Dizziness  Epigastric abdominal pain    Rx / DC Orders ED Discharge Orders  None       Solon Augusta [place], [place] 05/02/20 Ninfa Linden    Mancel Bale, MD 05/02/20 [phone]

## 2020-07-15 ENCOUNTER — Other Ambulatory Visit: Payer: Self-pay

## 2020-07-15 ENCOUNTER — Ambulatory Visit (INDEPENDENT_AMBULATORY_CARE_PROVIDER_SITE_OTHER): Payer: Medicare Other | Admitting: Obstetrics and Gynecology

## 2020-07-15 ENCOUNTER — Encounter: Payer: Self-pay | Admitting: Obstetrics and Gynecology

## 2020-07-15 DIAGNOSIS — Z4689 Encounter for fitting and adjustment of other specified devices: Secondary | ICD-10-CM | POA: Diagnosis not present

## 2020-07-15 DIAGNOSIS — N8111 Cystocele, midline: Secondary | ICD-10-CM

## 2020-07-15 NOTE — Progress Notes (Signed)
Patient ID: [name] [name], female   DOB: 1946/07/17, 74 y.o.   MRN: [id number] [name] [name] presents for adjustment of her pessary. She was last seen by Dr Marice Potter in 2016. H/O pelvic relaxation and has been using a # 5 ring pessary without problems However pt has lost some wt and has noticed that the current pessary feels lose. She denies any bowel or bladder dysfunction Takes pessary out at night No longer using vaginal estrogen cream  PE AF VSS Lungs clear Heart RRR Abd soft + BS GU Nl EGBUS atrophic vaginal mucosa, pessary removed and # 6 ring placed.  Pt was allowed to move around in the room and felt comfortable with the #6 ring.  Sample pessary removed and new pessary placed.   A/P Pelvic relaxation        Pessary adjustment and fitting  Pt will return in 3 months for re eval

## 2020-08-31 ENCOUNTER — Ambulatory Visit: Payer: Medicare Other | Admitting: Cardiology

## 2020-08-31 ENCOUNTER — Other Ambulatory Visit: Payer: Self-pay

## 2020-08-31 ENCOUNTER — Encounter: Payer: Self-pay | Admitting: Cardiology

## 2020-08-31 VITALS — BP 106/70 | HR 56 | Ht 63.0 in | Wt 177.6 lb

## 2020-08-31 DIAGNOSIS — E119 Type 2 diabetes mellitus without complications: Secondary | ICD-10-CM | POA: Diagnosis not present

## 2020-08-31 DIAGNOSIS — E7800 Pure hypercholesterolemia, unspecified: Secondary | ICD-10-CM | POA: Diagnosis not present

## 2020-08-31 DIAGNOSIS — I712 Thoracic aortic aneurysm, without rupture, unspecified: Secondary | ICD-10-CM

## 2020-08-31 NOTE — Progress Notes (Signed)
Cardiology Office Note:    Date:  08/31/2020   ID:  [name] [name], DOB 1946/01/20, MRN [id number]  PCP:  Verlon Au, MD  [facility] Cardiologist:  Donato Schultz, MD  [facility] Electrophysiologist:  None   Referring MD: Verlon Au, MD    History of Present Illness:    [name] [name] is a 74 y.o. female here for the follow-up of thoracic aortic dilatation.  First seen here in the clinic in 2019 for the evaluation of chest pain and shortness of breath.  No PE on CT.  There were no significant coronary artery calcifications noted on CT angiogram of chest from 2020.  Personally reviewed and interpreted the image.  CT showed ascending aorta 4.1 cm.  Stable.  Brother had MI in his 61s.  In 2015 she had a nuclear stress test that showed anteroapical ischemia with EF of 64% cath in 2015 was normal.  Has a history of stroke with left-sided weakness.  Went to ER in December 2020 with dizziness.  ER note reviewed.  Ended up being diagnosed with vertigo.  Troponins were normal then.  Her son is currently sick on dialysis.  She is worried.  She denies any significant shortness of breath no palpitations.  She does occasionally when she is stressed feel some musculoskeletal type tightness.  Past Medical History:  Diagnosis Date  . Arthritis   . Bronchitis    hx  . Chest pain 2009   MV with no scar or ischemia, EF 65%  . Complication of anesthesia    claustrophobic !  . Diabetes mellitus   . GERD (gastroesophageal reflux disease)   . History of cardiac catheterization    a. Myoview 2/15 with anteroapical ischemia, EF 64% (intermediate risk) >> LHC - normal cos, EF 55-60%  . Hypertension   . Neuromuscular disorder (HCC)    arotic Aneurysm  . Obesity (BMI 35.0-39.9 without comorbidity)   . Pneumonia   . Stroke [facility])    with some resid L sided weakness  . Thoracic ascending aortic aneurysm (HCC)    a. CT 1/17: ascending aorta 4.2 x 4.0 cm    Past  Surgical History:  Procedure Laterality Date  . ABDOMINAL HYSTERECTOMY  2006   Partial, one ovary left   . CARDIAC CATHETERIZATION  2004   [place], [place] per pt.  . CHOLECYSTECTOMY    . EAR CYST EXCISION Left 09/20/2016   Procedure: EXCISION  LEFT EAR LESION;  Surgeon: Newman Pies, MD;  Location: [facility] OR;  Service: ENT;  Laterality: Left;  . LEFT HEART CATHETERIZATION WITH CORONARY ANGIOGRAM N/A 01/09/2014   Procedure: LEFT HEART CATHETERIZATION WITH CORONARY ANGIOGRAM;  Surgeon: Marykay Lex, MD;  Location: [facility] CATH LAB;  Service: Cardiovascular;  Laterality: N/A;  . MENISCUS REPAIR Right   . ROTATOR CUFF REPAIR Left   . SKIN SPLIT GRAFT Left 09/20/2016   Procedure: SKIN GRAFT SPLIT THICKNESS;  Surgeon: Newman Pies, MD;  Location: [facility] OR;  Service: ENT;  Laterality: Left;  . TEMPOROMANDIBULAR JOINT SURGERY    . TONSILLECTOMY    . WISDOM TOOTH EXTRACTION      Current Medications: Current Meds  Medication Sig  . acetaminophen (TYLENOL) 500 MG tablet Take 1 tablet (500 mg total) by mouth every 6 (six) hours as needed.  Marland Kitchen albuterol (PROVENTIL HFA;VENTOLIN HFA) 108 (90 BASE) MCG/ACT inhaler Inhale 1-2 puffs into the lungs every 6 (six) hours as needed for wheezing.  Marland Kitchen aspirin EC 81 MG tablet  Take 81 mg by mouth daily.  Marland Kitchen. atorvastatin (LIPITOR) 10 MG tablet Take 10 mg by mouth daily.  . Cholecalciferol (VITAMIN D3) 1000 units CAPS Take 1 capsule by mouth daily.  . dapagliflozin propanediol (FARXIGA) 5 MG TABS tablet Take 5 mg by mouth daily.  Marland Kitchen. glimepiride (AMARYL) 2 MG tablet Take 2 mg by mouth daily with breakfast.  . linagliptin (TRADJENTA) 5 MG TABS tablet Take 5 mg by mouth daily.  Marland Kitchen. lisinopril-hydrochlorothiazide (PRINZIDE,ZESTORETIC) 10-12.5 MG tablet TAKE 1 TABLET BY MOUTH DAILY  . metoprolol succinate (TOPROL-XL) 50 MG 24 hr tablet TAKE 1 TABLET BY MOUTH DAILY WITH OR IMMEDIATELY FOLLOWING A MEAL  . Multiple Vitamins-Minerals (PRESERVISION AREDS 2) CAPS Take 1 tablet by mouth 2 (two)  times daily.  . Olopatadine HCl 0.2 % SOLN Place 1 drop into both eyes every evening.  Marland Kitchen. omeprazole (PRILOSEC) 40 MG capsule Take 40 mg by mouth daily.  Bertram Gala. Polyethyl Glycol-Propyl Glycol (SYSTANE OP) Apply 1 drop to eye 3 (three) times daily.  Marland Kitchen. spironolactone (ALDACTONE) 25 MG tablet Take 12.5 mg by mouth daily.     Allergies:   Adhesive [tape], Codeine, and Empagliflozin   Social History   Socioeconomic History  . Marital status: Widowed    Spouse name: Not on file  . Number of children: Not on file  . Years of education: Not on file  . Highest education level: Not on file  Occupational History  . Occupation: Engineer, structuralCaregiver for the elderly    Employer: RETIRED  Tobacco Use  . Smoking status: Never Smoker  . Smokeless tobacco: Never Used  Vaping Use  . Vaping Use: Never used  Substance and Sexual Activity  . Alcohol use: No  . Drug use: No  . Sexual activity: Not Currently    Birth control/protection: Post-menopausal  Other Topics Concern  . Not on file  Social History Narrative   Family History Details: Her mother died at age 75 of pancreatitis. Her father died of mitral valve disease at age 75. She has a brother who has had myocardial infarction and also has had bypass surgery and a sister who    has a pacemaker.         Social Determinants of Health   Financial Resource Strain:   . Difficulty of Paying Living Expenses: Not on file  Food Insecurity:   . Worried About Programme researcher, broadcasting/film/videounning Out of Food in the Last Year: Not on file  . Ran Out of Food in the Last Year: Not on file  Transportation Needs:   . Lack of Transportation (Medical): Not on file  . Lack of Transportation (Non-Medical): Not on file  Physical Activity:   . Days of Exercise per Week: Not on file  . Minutes of Exercise per Session: Not on file  Stress:   . Feeling of Stress : Not on file  Social Connections:   . Frequency of Communication with Friends and Family: Not on file  . Frequency of Social Gatherings  with Friends and Family: Not on file  . Attends Religious Services: Not on file  . Active Member of Clubs or Organizations: Not on file  . Attends BankerClub or Organization Meetings: Not on file  . Marital Status: Not on file     Family History: The patient's family history includes CAD in her son; Diabetes in her maternal grandmother; Heart disease in her brother and father; Kidney disease in her son; Pancreatitis in her mother; Valvular heart disease in her brother and father.  ROS:   Please see the history of present illness.    Denies any fevers chills nausea vomiting syncope bleeding all other systems reviewed and are negative.  EKGs/Labs/Other Studies Reviewed:    The following studies were reviewed today: Prior echo  EKG:  EKG is  ordered today.  The ekg ordered today demonstrates sinus bradycardia rate 56 with nonspecific ST-T wave changes  Recent Labs: 05/02/2020: ALT 9; BUN 17; Creatinine, Ser 1.10; Hemoglobin 13.2; Platelets 328; Potassium 4.1; Sodium 139  Recent Lipid Panel    Component Value Date/Time   CHOL 148 08/27/2016 0340   TRIG 72 08/27/2016 0340   HDL 38 (L) 08/27/2016 0340   CHOLHDL 3.9 08/27/2016 0340   VLDL 14 08/27/2016 0340   LDLCALC 96 08/27/2016 0340     Risk Assessment/Calculations:       Physical Exam:    VS:  BP 106/70   Pulse (!) 56   Ht 5\' 3"  (1.6 m)   Wt 177 lb 9.6 oz (80.6 kg)   SpO2 96%   BMI 31.46 kg/m     Wt Readings from Last 3 Encounters:  08/31/20 177 lb 9.6 oz (80.6 kg)  07/15/20 178 lb 4.8 oz (80.9 kg)  05/02/20 180 lb (81.6 kg)     GEN:  Well nourished, well developed in no acute distress HEENT: Normal NECK: No JVD; No carotid bruits LYMPHATICS: No lymphadenopathy CARDIAC: RRR, no murmurs, rubs, gallops RESPIRATORY:  Clear to auscultation without rales, wheezing or rhonchi  ABDOMEN: Soft, non-tender, non-distended MUSCULOSKELETAL:  No edema; No deformity  SKIN: Warm and dry NEUROLOGIC:  Alert and oriented x  3 PSYCHIATRIC:  Normal affect   ASSESSMENT:    1. Thoracic aortic aneurysm without rupture (HCC)   2. Type 2 diabetes mellitus without complication, without long-term current use of insulin (HCC)   3. Pure hypercholesterolemia    PLAN:    In order of problems listed above:  Ascending thoracic aortic dilatation -Mild 4.1 cm in 2017-stable 40 mm in 2019.  On 02/09/2020-echo showed stable 40 mm root.  Prior history of chest pain -Catheterization 2015 normal coronary arteries.  Sharp-like pain in the past likely musculoskeletal.  Reassurance.  Diabetes with hypertension -Continue with good overall blood pressure control.  Medications reviewed with no changes.  Creatinine 1.1, potassium 4.1, hemoglobin 13.2 from outside labs.  Hyperlipidemia -On atorvastatin 10 mg.  Thankfully, no coronary artery calcifications noted on CT scan.  She had very mild aortic atherosclerosis noted surrounding aortic valve.  Liver function reviewed from outside labs 9, normal.  Continue with current medical management.   Medication Adjustments/Labs and Tests Ordered: Current medicines are reviewed at length with the patient today.  Concerns regarding medicines are outlined above.  Orders Placed This Encounter  Procedures  . EKG 12-Lead   No orders of the defined types were placed in this encounter.   Patient Instructions  Medication Instructions:  No changes *If you need a refill on your cardiac medications before your next appointment, please call your pharmacy*   Lab Work: none If you have labs (blood work) drawn today and your tests are completely normal, you will receive your results only by: 2016 MyChart Message (if you have MyChart) OR . A paper copy in the mail If you have any lab test that is abnormal or we need to change your treatment, we will call you to review the results.   Testing/Procedures: none  Follow-Up: At [facility], you and your health needs are our priority.  As part  of our continuing mission to provide you with exceptional heart care, we have created designated Provider Care Teams.  These Care Teams include your primary Cardiologist (physician) and Advanced Practice Providers (APPs -  Physician Assistants and Nurse Practitioners) who all work together to provide you with the care you need, when you need it.   Your next appointment:   12 month(s)  The format for your next appointment:   In Person  Provider:   You may see Donato Schultz, MD or one of the following Advanced Practice Providers on your designated Care Team:    Norma Fredrickson, NP  Nada Boozer, NP  Georgie Chard, NP   Other Instructions      Signed, Donato Schultz, MD  08/31/2020 4:22 PM    [facility]

## 2020-08-31 NOTE — Patient Instructions (Signed)
Medication Instructions:  °No changes °*If you need a refill on your cardiac medications before your next appointment, please call your pharmacy* ° ° °Lab Work: °none °If you have labs (blood work) drawn today and your tests are completely normal, you will receive your results only by: °• MyChart Message (if you have MyChart) OR °• A paper copy in the mail °If you have any lab test that is abnormal or we need to change your treatment, we will call you to review the results. ° ° °Testing/Procedures: °none ° ° °Follow-Up: °At [facility], you and your health needs are our priority.  As part of our continuing mission to provide you with exceptional heart care, we have created designated Provider Care Teams.  These Care Teams include your primary Cardiologist (physician) and Advanced Practice Providers (APPs -  Physician Assistants and Nurse Practitioners) who all work together to provide you with the care you need, when you need it. ° ° °Your next appointment:   °12 month(s) ° °The format for your next appointment:   °In Person ° °Provider:   °You may see Mark Skains, MD or one of the following Advanced Practice Providers on your designated Care Team:   °· Lori Gerhardt, NP °· Laura Ingold, NP °· Jill McDaniel, NP ° ° ° °Other Instructions ° ° °

## 2021-01-11 DIAGNOSIS — E1142 Type 2 diabetes mellitus with diabetic polyneuropathy: Secondary | ICD-10-CM | POA: Insufficient documentation

## 2021-04-23 ENCOUNTER — Ambulatory Visit (HOSPITAL_COMMUNITY)
Admission: EM | Admit: 2021-04-23 | Discharge: 2021-04-23 | Disposition: A | Discharge status: Home / Self Care | Payer: Medicare Other | Attending: Student | Admitting: Student

## 2021-04-23 ENCOUNTER — Ambulatory Visit (INDEPENDENT_AMBULATORY_CARE_PROVIDER_SITE_OTHER): Payer: Medicare Other

## 2021-04-23 ENCOUNTER — Encounter (HOSPITAL_COMMUNITY): Payer: Self-pay

## 2021-04-23 DIAGNOSIS — M1712 Unilateral primary osteoarthritis, left knee: Secondary | ICD-10-CM | POA: Diagnosis not present

## 2021-04-23 DIAGNOSIS — W19XXXA Unspecified fall, initial encounter: Secondary | ICD-10-CM

## 2021-04-23 DIAGNOSIS — W1830XA Fall on same level, unspecified, initial encounter: Secondary | ICD-10-CM | POA: Diagnosis not present

## 2021-04-23 DIAGNOSIS — M25562 Pain in left knee: Secondary | ICD-10-CM | POA: Diagnosis not present

## 2021-04-23 NOTE — ED Provider Notes (Signed)
[facility]    CSN: [id number] Arrival date & time: 04/23/21  1409      History   Chief Complaint Chief Complaint  Patient presents with  . Fall    HPI [name] [name] is a 75 y.o. female presenting with L knee pain following fall that occurred 2 weeks ago.  Medical history obesity.  Notes 2 weeks of left outer knee pain following a fall states that she was walking in her yard when she tripped, landing on the left knee.  Denies dizziness before or after the fall.  Denies injuring other joints.  Denies pain elsewhere.  Denies abdominal pain, change in bowel or bladder function.  Denies head trauma, loss of consciousness.  She does not take long-term anticoagulation.  Denies dizziness, headaches today.  Ambulating with a cane due to pain.  HPI  Past Medical History:  Diagnosis Date  . Arthritis   . Bronchitis    hx  . Chest pain 2009   MV with no scar or ischemia, EF 65%  . Complication of anesthesia    claustrophobic !  . Diabetes mellitus   . GERD (gastroesophageal reflux disease)   . History of cardiac catheterization    a. Myoview 2/15 with anteroapical ischemia, EF 64% (intermediate risk) >> LHC - normal cos, EF 55-60%  . Hypertension   . Neuromuscular disorder (HCC)    arotic Aneurysm  . Obesity (BMI 35.0-39.9 without comorbidity)   . Pneumonia   . Stroke [facility])    with some resid L sided weakness  . Thoracic ascending aortic aneurysm (HCC)    a. CT 1/17: ascending aorta 4.2 x 4.0 cm    Patient Active Problem List   Diagnosis Date Noted  . 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 Encounter for fitting and adjustment of pessary 07/15/2020  . Chronic sinusitis   . Complicated migraine   . Pure hypercholesterolemia   . TIA (transient ischemic attack)   . Atypical chest pain   . Cerebral infarction (HCC) 08/26/2016  . Chronic diastolic heart failure, NYHA class 2 (HCC) 12/07/2015  . Pulmonary HTN (HCC) 12/07/2015  . Chronic chest pain  12/07/2015  . Thoracic aortic aneurysm (HCC) 12/07/2015  . Left sided numbness 12/07/2015  . Bradycardia 05/06/2012  . CVA (cerebral vascular accident) (HCC) 05/05/2012  . Obesity due to excess calories 04/25/2012  . Cystocele 03/22/2012  . Type 2 diabetes mellitus without complication, without long-term current use of insulin (HCC) 03/22/2012  . Essential hypertension 03/22/2012  . gerd 03/22/2012    Past Surgical History:  Procedure Laterality Date  . ABDOMINAL HYSTERECTOMY  2006   Partial, one ovary left   . CARDIAC CATHETERIZATION  2004   [place], [place] per pt.  . CHOLECYSTECTOMY    . EAR CYST EXCISION Left 09/20/2016   Procedure: EXCISION  LEFT EAR LESION;  Surgeon: Newman Pies, MD;  [facility]: [facility];  Service: ENT;  Laterality: Left;  . LEFT HEART CATHETERIZATION WITH CORONARY ANGIOGRAM N/A 01/09/2014   Procedure: LEFT HEART CATHETERIZATION WITH CORONARY ANGIOGRAM;  Surgeon: Marykay Lex, MD;  Location: [facility] CATH LAB;  Service: Cardiovascular;  Laterality: N/A;  . MENISCUS REPAIR Right   . ROTATOR CUFF REPAIR Left   . SKIN SPLIT GRAFT Left 09/20/2016   Procedure: SKIN GRAFT SPLIT THICKNESS;  Surgeon: Newman Pies, MD;  Location: [facility] OR;  Service: ENT;  Laterality: Left;  . TEMPOROMANDIBULAR JOINT SURGERY    . TONSILLECTOMY    . WISDOM  TOOTH EXTRACTION      OB History    Gravida  4   Para  4   Term  4   Preterm  0   AB  0   Living  4     SAB  0   IAB  0   Ectopic  0   Multiple  0   Live Births           Obstetric Comments  Delivered 4 large babies vaginally         Home Medications    Prior to Admission medications   Medication Sig Start Date End Date Taking? Authorizing Provider  acetaminophen (TYLENOL) 500 MG tablet Take 1 tablet (500 mg total) by mouth every 6 (six) hours as needed. 10/21/19   Fawze, Mina A, PA-C  albuterol (PROVENTIL HFA;VENTOLIN HFA) 108 (90 BASE) MCG/ACT inhaler Inhale 1-2 puffs into the lungs every 6 (six) hours as needed for  wheezing. 07/29/13   Reuben LikesKeller, David C, MD  aspirin EC 81 MG tablet Take 81 mg by mouth daily.    [provider]  atorvastatin (LIPITOR) 10 MG tablet Take 10 mg by mouth daily. 08/05/20   [provider]  Cholecalciferol (VITAMIN D3) 1000 units CAPS Take 1 capsule by mouth daily.    [provider]  dapagliflozin propanediol (FARXIGA) 5 MG TABS tablet Take 5 mg by mouth daily.    [provider]  glimepiride (AMARYL) 2 MG tablet Take 2 mg by mouth daily with breakfast.    [provider]  linagliptin (TRADJENTA) 5 MG TABS tablet Take 5 mg by mouth daily.    [provider]  lisinopril-hydrochlorothiazide (PRINZIDE,ZESTORETIC) 10-12.5 MG tablet TAKE 1 TABLET BY MOUTH DAILY 12/27/15   Nicholaus Bloomove, Myra C, MD  metoprolol succinate (TOPROL-XL) 50 MG 24 hr tablet TAKE 1 TABLET BY MOUTH DAILY WITH OR IMMEDIATELY FOLLOWING A MEAL 07/16/17   Weaver, Scott T, PA-C  Multiple Vitamins-Minerals (PRESERVISION AREDS 2) CAPS Take 1 tablet by mouth 2 (two) times daily.    [provider]  Olopatadine HCl 0.2 % SOLN Place 1 drop into both eyes every evening.    [provider]  omeprazole (PRILOSEC) 40 MG capsule Take 40 mg by mouth daily.    [provider]  Polyethyl Glycol-Propyl Glycol (SYSTANE OP) Apply 1 drop to eye 3 (three) times daily.    [provider]  spironolactone (ALDACTONE) 25 MG tablet Take 12.5 mg by mouth daily.    [provider]    Family History Family History  Problem Relation Age of Onset  . Heart disease Father   . Valvular heart disease Father        aortic valve replacement  . Pancreatitis Mother   . Diabetes Maternal Grandmother   . Heart disease Brother   . Valvular heart disease Brother        aortic valve replacement  . CAD Son        heart attack  . Kidney disease Son     Social History Social History   Tobacco Use  . Smoking status: Never Smoker  . Smokeless tobacco: Never Used   Vaping Use  . Vaping Use: Never used  Substance Use Topics  . Alcohol use: No  . Drug use: No     Allergies   Adhesive [tape], Codeine, and Empagliflozin   Review of Systems Review of Systems  Musculoskeletal:       L knee pain  All other systems  reviewed and are negative.    Physical Exam Triage Vital Signs ED Triage Vitals  Enc Vitals Group     BP 04/23/21 1505 128/78     Pulse Rate 04/23/21 1505 (!) 57     Resp 04/23/21 1505 16     Temp 04/23/21 1505 98.1 F (36.7 C)     Temp Source 04/23/21 1505 Oral     SpO2 04/23/21 1505 100 %     Weight --      Height --      Head Circumference --      Peak Flow --      Pain Score 04/23/21 1507 7     Pain Loc --      Pain Edu? --      Excl. in GC? --    No data found.  Updated Vital Signs BP 128/78 (BP Location: Right Arm)   Pulse (!) 57   Temp 98.1 F (36.7 C) (Oral)   Resp 16   SpO2 100%   Visual Acuity Right Eye Distance:   Left Eye Distance:   Bilateral Distance:    Right Eye Near:   Left Eye Near:    Bilateral Near:     Physical Exam Vitals reviewed.  Constitutional:      General: She is not in acute distress.    Appearance: Normal appearance. She is not ill-appearing or diaphoretic.  HENT:     Head: Normocephalic and atraumatic.  Eyes:     Extraocular Movements: Extraocular movements intact.     Pupils: Pupils are equal, round, and reactive to light.  Cardiovascular:     Rate and Rhythm: Normal rate and regular rhythm.     Heart sounds: Normal heart sounds.  Pulmonary:     Effort: Pulmonary effort is normal.     Breath sounds: Normal breath sounds.  Abdominal:     Palpations: Abdomen is soft.     Tenderness: There is no abdominal tenderness.  Musculoskeletal:     Comments: L knee- TTP to lateral jointline. No bony deformity. ROM intact but lateral knee pain with ambulation. No joint laxity. No effusion. Strength 5/5, sensation intact. Ambulating with cane due to pain. DP 2+, negative homan  sign.  Skin:    General: Skin is warm.     Capillary Refill: Capillary refill takes less than 2 seconds.  Neurological:     General: No focal deficit present.     Mental Status: She is alert and oriented to person, place, and time.  Psychiatric:        Mood and Affect: Mood normal.        Behavior: Behavior normal.        Thought Content: Thought content normal.        Judgment: Judgment normal.      UC Treatments / Results  Labs (all labs ordered are listed, but only abnormal results are displayed) Labs Reviewed - No data to display  EKG   Radiology DG Knee Complete 4 Views Left  Result Date: 04/23/2021 CLINICAL DATA:  LEFT knee pain after fall. EXAM: LEFT KNEE - COMPLETE 4+ VIEW COMPARISON:  None. FINDINGS: Osseous alignment is normal. No fracture line or displaced fracture fragment is seen. Tricompartmental degenerative change, mild to moderate in degree. No appreciable joint effusion. IMPRESSION: 1. No acute findings. No fracture or dislocation. 2. Tricompartmental degenerative change, mild to moderate in degree. Electronically Signed   By: Bary Richard M.D.   On: 04/23/2021 16:54  Procedures Procedures (including critical care time)  Medications Ordered in UC Medications - No data to display  Initial Impression / Assessment and Plan / UC Course  I have reviewed the triage vital signs and the nursing notes.  Pertinent labs & imaging results that were available during my care of the patient were reviewed by me and considered in my medical decision making (see chart for details).     This patient is a 75 year old female presenting with exacerbation of L knee OA following fall. Neurovascularly intact. Denies head trauma, LOC. She does not take anticoagulation.  Xray L knee- 1. No acute findings. No fracture or dislocation. 2. Tricompartmental degenerative change, mild to moderate in degree.  Knee brace provided. F/u with ortho if symptoms persist, information  provided.   ED return precautions discussed.  Final Clinical Impressions(s) / UC Diagnoses   Final diagnoses:  Primary osteoarthritis of left knee  Acute pain of left knee  Fall, initial encounter     Discharge Instructions     -You do not have any broken bones in your knee, but I think that the fall aggravated some osteoarthritis. -Use the knee brace for the next 1 to 2 weeks while you are having pain.  Also use your cane to help you walk. -If symptoms persist in about 5 days (6/8), call orthopedist to schedule follow-up with them.  Information below, or you can have your primary care send a referral for this.    ED Prescriptions    None     PDMP not reviewed this encounter.   Rhys Martini, PA-C 04/23/21 1712

## 2021-04-23 NOTE — Discharge Instructions (Addendum)
-  You do not have any broken bones in your knee, but I think that the fall aggravated some osteoarthritis. -Use the knee brace for the next 1 to 2 weeks while you are having pain.  Also use your cane to help you walk. -If symptoms persist in about 5 days (6/8), call orthopedist to schedule follow-up with them.  Information below, or you can have your primary care send a referral for this.

## 2021-04-23 NOTE — ED Triage Notes (Signed)
Pt present she fall yesterday and injured her left knee. Pt state she was outside in the yard when she lost her balance and fall.

## 2021-05-05 ENCOUNTER — Telehealth: Payer: Self-pay

## 2021-05-05 NOTE — Telephone Encounter (Signed)
NOTES ON FILE FROM [facility] [phone], SENT REFERRAL TO SCHEDULING 

## 2021-05-17 ENCOUNTER — Ambulatory Visit: Payer: Medicare Other | Admitting: Family

## 2021-05-17 ENCOUNTER — Encounter: Payer: Self-pay | Admitting: Family

## 2021-05-17 ENCOUNTER — Other Ambulatory Visit: Payer: Self-pay

## 2021-05-17 VITALS — BP 112/62 | HR 57 | Ht 63.0 in | Wt 175.0 lb

## 2021-05-17 DIAGNOSIS — R079 Chest pain, unspecified: Secondary | ICD-10-CM

## 2021-05-17 DIAGNOSIS — Z8673 Personal history of transient ischemic attack (TIA), and cerebral infarction without residual deficits: Secondary | ICD-10-CM | POA: Diagnosis not present

## 2021-05-17 DIAGNOSIS — E782 Mixed hyperlipidemia: Secondary | ICD-10-CM

## 2021-05-17 DIAGNOSIS — I712 Thoracic aortic aneurysm, without rupture, unspecified: Secondary | ICD-10-CM

## 2021-05-17 DIAGNOSIS — I10 Essential (primary) hypertension: Secondary | ICD-10-CM

## 2021-05-17 DIAGNOSIS — E119 Type 2 diabetes mellitus without complications: Secondary | ICD-10-CM

## 2021-05-17 DIAGNOSIS — I200 Unstable angina: Secondary | ICD-10-CM

## 2021-05-17 NOTE — Patient Instructions (Signed)
Medication Instructions:  Continue your current medications.   *If you need a refill on your cardiac medications before your next appointment, please call your pharmacy*   Lab Work: None ordered today  If you have labs (blood work) drawn today and your tests are completely normal, you will receive your results only by: MyChart Message (if you have MyChart) OR A paper copy in the mail If you have any lab test that is abnormal or we need to change your treatment, we will call you to review the results.   Testing/Procedures: Your physician has requested that you have a lexiscan myoview.  Please follow instruction sheet, as BELOW:    You are scheduled for a Myocardial Perfusion Imaging Study  Please arrive 15 minutes prior to your appointment time for registration and insurance purposes.  The test will take approximately 3 to 4 hours to complete; you may bring reading material.  If someone comes with you to your appointment, they will need to remain in the main lobby due to limited space in the testing area. **If you are pregnant or breastfeeding, please notify the nuclear lab prior to your appointment**  How to prepare for your Myocardial Perfusion Test: Do not eat or drink 3 hours prior to your test, except you may have water. Do not consume products containing caffeine (regular or decaffeinated) 12 hours prior to your test. (ex: coffee, chocolate, sodas, tea). Do not take the Lisinopirl-HCTZ until after your test  Do wear comfortable clothes (no dresses or overalls) and walking shoes, tennis shoes preferred (No heels or open toe shoes are allowed). Do NOT wear cologne, perfume, aftershave, or lotions (deodorant is allowed). If these instructions are not followed, your test will have to be rescheduled.    Follow-Up: At [facility], you and your health needs are our priority.  As part of our continuing mission to provide you with exceptional heart care, we have created designated  Provider Care Teams.  These Care Teams include your primary Cardiologist (physician) and Advanced Practice Providers (APPs -  Physician Assistants and Nurse Practitioners) who all work together to provide you with the care you need, when you need it.  We recommend signing up for the patient portal called "MyChart".  Sign up information is provided on this After Visit Summary.  MyChart is used to connect with patients for Virtual Visits (Telemedicine).  Patients are able to view lab/test results, encounter notes, upcoming appointments, etc.  Non-urgent messages can be sent to your provider as well.   To learn more about what you can do with MyChart, go to [email or web address].    Your next appointment:   3 month(s)  The format for your next appointment:   In Person  Provider:   You may see Donato Schultz, MD or one of the following Advanced Practice Providers on your designated Care Team:   Georgie Chard, NP   Other Instructions  Heart Healthy Diet Recommendations: A low-salt diet is recommended. Meats should be grilled, baked, or boiled. Avoid fried foods. Focus on lean protein sources like fish or chicken with vegetables and fruits. The American Heart Association is a Chief Technology Officer!  American Heart Association Diet and Lifeystyle Recommendations   Exercise recommendations: The American Heart Association recommends 150 minutes of moderate intensity exercise weekly. Try 30 minutes of moderate intensity exercise 4-5 times per week. This could include walking, jogging, or swimming.

## 2021-05-17 NOTE — Progress Notes (Signed)
Office Visit    Patient Name: [name] [name] Date of Encounter: 05/17/2021  PCP:  Verlon Au, MD   [facility]  Cardiologist:  Donato Schultz, MD  Advanced Practice Provider:  No care team member to display Electrophysiologist:  None   Chief Complaint    [name] [name] is a 75 y.o. female with a hx of ascending thoracic aortic dilation, chest pain, stroke with residual left-sided weakness, hyperlipidemia, DM2 presents today for chest pain.   Past Medical History    Past Medical History:  Diagnosis Date   Arthritis    Bronchitis    hx   Chest pain 2009   MV with no scar or ischemia, EF 65%   Complication of anesthesia    claustrophobic !   Diabetes mellitus    GERD (gastroesophageal reflux disease)    History of cardiac catheterization    a. Myoview 2/15 with anteroapical ischemia, EF 64% (intermediate risk) >> LHC - normal cos, EF 55-60%   Hypertension    Neuromuscular disorder (HCC)    arotic Aneurysm   Obesity (BMI 35.0-39.9 without comorbidity)    Pneumonia    Stroke [name]    with some resid L sided weakness   Thoracic ascending aortic aneurysm (HCC)    a. CT 1/17: ascending aorta 4.2 x 4.0 cm   Past Surgical History:  Procedure Laterality Date   ABDOMINAL HYSTERECTOMY  2006   Partial, one ovary left    CARDIAC CATHETERIZATION  2004   [place], [place] per pt.   CHOLECYSTECTOMY     EAR CYST EXCISION Left 09/20/2016   Procedure: EXCISION  LEFT EAR LESION;  Surgeon: Newman Pies, MD;  Location[facility];  Service: ENT;  Laterality: Left;   LEFT HEART CATHETERIZATION WITH CORONARY ANGIOGRAM N/A 01/09/2014   Procedure: LEFT HEART CATHETERIZATION WITH CORONARY ANGIOGRAM;  Surgeon: Marykay Lex, MD;  Location: [facility] CATH LAB;  Service: Cardiovascular;  Laterality: N/A;   MENISCUS REPAIR Right    ROTATOR CUFF REPAIR Left    SKIN SPLIT GRAFT Left 09/20/2016   Procedure: SKIN GRAFT SPLIT THICKNESS;  Surgeon: Newman Pies, MD;  Location: [facility];   Service: ENT;  Laterality: Left;   TEMPOROMANDIBULAR JOINT SURGERY     TONSILLECTOMY     WISDOM TOOTH EXTRACTION      Allergies  Allergies  Allergen Reactions   Adhesive [Tape] Rash    pls use elastic wraps instead of adhesive tape   Codeine Nausea And Vomiting   Empagliflozin Other (See Comments)    Headaches & lower back pain    History of Present Illness    [name] [name] is a 75 y.o. female with a hx of ascending thoracic aortic dilation, chest pain, stroke with residual left-sided weakness, hyperlipidemia, DM2 last seen 08/24/2020 by Dr. Anne Fu.  Previous nuclear stress test and cardiac catheterization in 2015 with no noted coronary artery disease and normal LVEF.  She was initially evaluated in clinic in 2019 for chest pain and shortness of breath.  No PE noted on CT and no significant coronary calcifications noted on CT angiogram of chest from 2020.  CT at that time with stable ascending aortic aneurysm 4.1 cm.  She was evaluated in the emergency department December 2020 for dizziness and diagnosed with vertigo.  Echo 02/09/20 stable 64mm aortic root.  When last seen October 2021 she noted some musculoskeletal type tightness when she is feeling distressed but no significant shortness of breath nor palpitations.  She was seen by PCP 05/03/21 noting chest pain and recommended to follow up with PCP.   Presents today for follow up with her daughter and grandson. The chest pain has been ongoing for a couple of months. Tells me she notices hwen she walks "quite a bit" it will get worse. She has to stop and rest and catch her breath for about 2 minutes. It is in her left chest and radiates down her left arm. It is associated with shortness of breath. No diaphoresis nor nausea. She does yard work and housework for exercise but no formal exercise routine. Notes intermittent palpitations when she lays on her left side. Self resolve quickly. BP at home 120/70. She reports sensation of  dizziness. She tells me she can tell when it is going to come on. It happens about once per week and is similar to previous vertigo. Encouraged to follow up with PCP. No lightheadedness, near syncope, syncope.   EKGs/Labs/Other Studies Reviewed:   The following studies were reviewed today:  Echo 01/2020 1. Left ventricular ejection fraction, by estimation, is 50 to 55%. The  left ventricle has low normal function. The left ventricle has no regional  wall motion abnormalities. Left ventricular diastolic parameters are  consistent with Grade I diastolic  dysfunction (impaired relaxation).   2. Right ventricular systolic function was not well visualized. The right  ventricular size is not well visualized.   3. The mitral valve is normal in structure. Trivial mitral valve  regurgitation. No evidence of mitral stenosis.   4. The aortic valve is normal in structure. Aortic valve regurgitation is  not visualized. No aortic stenosis is present.   5. Aortic dilatation noted. There is mild dilatation of the ascending  aorta measuring 40 mm.   6. The inferior vena cava is normal in size with greater than 50%  respiratory variability, suggesting right atrial pressure of 3 mmHg.   EKG:  EKG is  ordered today.  The ekg ordered today demonstrates SB 57 bpm with occasional PAC and stable TWI V1-V3 - overall nonspecific T wave changes. No acute ST/T wave changes.   Recent Labs: No results found for requested labs within last 8760 hours.  Recent Lipid Panel    Component Value Date/Time   CHOL 148 08/27/2016 0340   TRIG 72 08/27/2016 0340   HDL 38 (L) 08/27/2016 0340   CHOLHDL 3.9 08/27/2016 0340   VLDL 14 08/27/2016 0340   LDLCALC 96 08/27/2016 0340    Home Medications   No outpatient medications have been marked as taking for the 05/17/21 encounter (Appointment) with Alver Sorrow, NP.     Review of Systems    All other systems reviewed and are otherwise negative except as noted  above.  Physical Exam    VS:  There were no vitals taken for this visit. , BMI There is no height or weight on file to calculate BMI.  Wt Readings from Last 3 Encounters:  08/31/20 177 lb 9.6 oz (80.6 kg)  07/15/20 178 lb 4.8 oz (80.9 kg)  05/02/20 180 lb (81.6 kg)     GEN: Well nourished, well developed, in no acute distress. HEENT: normal. Neck: Supple, no JVD, carotid bruits, or masses. Cardiac: RRR, no murmurs, rubs, or gallops. No clubbing, cyanosis, edema.  Radials/DP/PT 2+ and equal bilaterally.  Respiratory:  Respirations regular and unlabored, clear to auscultation bilaterally. GI: Soft, nontender, nondistended. MS: No deformity or atrophy. Skin: Warm and dry, no rash. Neuro:  Strength and  sensation are intact. Psych: Normal affect.  Assessment & Plan    Ascending thoracic aortic dilation - 2017 4.1cm, 2019 46mm, echo 01/2020 33mm. Continue optimal BP control, statin. Consider repeat echo at follow up for monitoring. Will prioritize ischemic evaluation, as above.   Chest pain - Catheterization 2015 with normal coronary arteries. EKG today NSR with TWWi V1-V3 stable compared to previous. Notes exertional chest discomfort associated with dyspnea. Plan for lexiscan myoview to rule out CAD and ischemia.   Shared Decision Making/Informed Consent The risks [chest pain, shortness of breath, cardiac arrhythmias, dizziness, blood pressure fluctuations, myocardial infarction, stroke/transient ischemic attack, nausea, vomiting, allergic reaction, radiation exposure, metallic taste sensation and life-threatening complications (estimated to be 1 in 10,000)], benefits (risk stratification, diagnosing coronary artery disease, treatment guidance) and alternatives of a nuclear stress test were discussed in detail with [name] [name] and she agrees to proceed.  DM2 - Continue to follow with PCP.   HTN - BP well controlled. Continue current antihypertensive regimen.   HLD - Continue Atorvastatin  10mg  daily.  History of CVA - Continue aspirin, statin, optimal BP and A1c control.   Disposition: Follow up in 3 month(s) with Dr. or APP.    Signed, Anne Fu, NP 05/17/2021, 12:35 PM [facility]

## 2021-06-07 ENCOUNTER — Encounter (HOSPITAL_COMMUNITY): Payer: Self-pay | Admitting: *Deleted

## 2021-06-07 ENCOUNTER — Telehealth (HOSPITAL_COMMUNITY): Payer: Self-pay | Admitting: *Deleted

## 2021-06-07 NOTE — Telephone Encounter (Signed)
Patient given detailed instructions per Myocardial Perfusion Study Information Sheet for the test on 06/13/21 at 0745. Patient notified to arrive 15 minutes early and that it is imperative to arrive on time for appointment to keep from having the test rescheduled.  If you need to cancel or reschedule your appointment, please call the office within 24 hours of your appointment. . Patient verbalized understanding.Lenton Gendreau, Adelene Idler Mychart letter sent

## 2021-06-09 ENCOUNTER — Other Ambulatory Visit: Payer: Self-pay

## 2021-06-09 ENCOUNTER — Inpatient Hospital Stay (HOSPITAL_COMMUNITY)
Admission: EM | Admit: 2021-06-09 | Discharge: 2021-06-11 | DRG: 287 | Disposition: A | Discharge status: Home / Self Care | Payer: Medicare Other | Attending: Internal Medicine | Admitting: Internal Medicine

## 2021-06-09 ENCOUNTER — Emergency Department (HOSPITAL_COMMUNITY): Payer: Medicare Other

## 2021-06-09 DIAGNOSIS — E669 Obesity, unspecified: Secondary | ICD-10-CM | POA: Diagnosis present

## 2021-06-09 DIAGNOSIS — R0789 Other chest pain: Principal | ICD-10-CM

## 2021-06-09 DIAGNOSIS — Z20822 Contact with and (suspected) exposure to covid-19: Secondary | ICD-10-CM | POA: Diagnosis present

## 2021-06-09 DIAGNOSIS — F40240 Claustrophobia: Secondary | ICD-10-CM | POA: Diagnosis present

## 2021-06-09 DIAGNOSIS — R079 Chest pain, unspecified: Secondary | ICD-10-CM | POA: Diagnosis not present

## 2021-06-09 DIAGNOSIS — I10 Essential (primary) hypertension: Secondary | ICD-10-CM

## 2021-06-09 DIAGNOSIS — Z90710 Acquired absence of both cervix and uterus: Secondary | ICD-10-CM

## 2021-06-09 DIAGNOSIS — I5032 Chronic diastolic (congestive) heart failure: Secondary | ICD-10-CM | POA: Diagnosis present

## 2021-06-09 DIAGNOSIS — E119 Type 2 diabetes mellitus without complications: Secondary | ICD-10-CM | POA: Diagnosis present

## 2021-06-09 DIAGNOSIS — E785 Hyperlipidemia, unspecified: Secondary | ICD-10-CM | POA: Diagnosis present

## 2021-06-09 DIAGNOSIS — I110 Hypertensive heart disease with heart failure: Secondary | ICD-10-CM | POA: Diagnosis present

## 2021-06-09 DIAGNOSIS — Z9049 Acquired absence of other specified parts of digestive tract: Secondary | ICD-10-CM

## 2021-06-09 DIAGNOSIS — I2510 Atherosclerotic heart disease of native coronary artery without angina pectoris: Secondary | ICD-10-CM | POA: Diagnosis not present

## 2021-06-09 DIAGNOSIS — I200 Unstable angina: Secondary | ICD-10-CM | POA: Insufficient documentation

## 2021-06-09 DIAGNOSIS — Z8249 Family history of ischemic heart disease and other diseases of the circulatory system: Secondary | ICD-10-CM | POA: Diagnosis not present

## 2021-06-09 DIAGNOSIS — I77810 Thoracic aortic ectasia: Secondary | ICD-10-CM

## 2021-06-09 DIAGNOSIS — E876 Hypokalemia: Secondary | ICD-10-CM | POA: Diagnosis present

## 2021-06-09 DIAGNOSIS — K760 Fatty (change of) liver, not elsewhere classified: Secondary | ICD-10-CM | POA: Diagnosis present

## 2021-06-09 DIAGNOSIS — Z7982 Long term (current) use of aspirin: Secondary | ICD-10-CM

## 2021-06-09 DIAGNOSIS — Z7984 Long term (current) use of oral hypoglycemic drugs: Secondary | ICD-10-CM

## 2021-06-09 DIAGNOSIS — K219 Gastro-esophageal reflux disease without esophagitis: Secondary | ICD-10-CM | POA: Diagnosis present

## 2021-06-09 DIAGNOSIS — I69354 Hemiplegia and hemiparesis following cerebral infarction affecting left non-dominant side: Secondary | ICD-10-CM | POA: Diagnosis not present

## 2021-06-09 DIAGNOSIS — Z79899 Other long term (current) drug therapy: Secondary | ICD-10-CM

## 2021-06-09 DIAGNOSIS — I878 Other specified disorders of veins: Secondary | ICD-10-CM

## 2021-06-09 DIAGNOSIS — N179 Acute kidney failure, unspecified: Secondary | ICD-10-CM | POA: Diagnosis present

## 2021-06-09 LAB — CBC
HCT: 41.2 % (ref 36.0–46.0)
Hemoglobin: 13.3 g/dL (ref 12.0–15.0)
MCH: 29.9 pg (ref 26.0–34.0)
MCHC: 32.3 g/dL (ref 30.0–36.0)
MCV: 92.6 fL (ref 80.0–100.0)
Platelets: 262 10*3/uL (ref 150–400)
RBC: 4.45 MIL/uL (ref 3.87–5.11)
RDW: 14 % (ref 11.5–15.5)
WBC: 6.4 10*3/uL (ref 4.0–10.5)
nRBC: 0 % (ref 0.0–0.2)

## 2021-06-09 LAB — CBG MONITORING, ED
Glucose-Capillary: 106 mg/dL — ABNORMAL HIGH (ref 70–99)
Glucose-Capillary: 97 mg/dL (ref 70–99)
Glucose-Capillary: 162 mg/dL — ABNORMAL HIGH (ref 70–99)

## 2021-06-09 LAB — BASIC METABOLIC PANEL
Anion gap: 10 (ref 5–15)
BUN: 23 mg/dL (ref 8–23)
CO2: 21 mmol/L — ABNORMAL LOW (ref 22–32)
Calcium: 9.6 mg/dL (ref 8.9–10.3)
Chloride: 107 mmol/L (ref 98–111)
Creatinine, Ser: 1.4 mg/dL — ABNORMAL HIGH (ref 0.44–1.00)
GFR, Estimated: 39 mL/min — ABNORMAL LOW (ref >60)
Glucose, Bld: 127 mg/dL — ABNORMAL HIGH (ref 70–99)
Potassium: 3.8 mmol/L (ref 3.5–5.1)
Sodium: 138 mmol/L (ref 135–145)

## 2021-06-09 LAB — TROPONIN I (HIGH SENSITIVITY)
Troponin I (High Sensitivity): 3 ng/L (ref <18)
Troponin I (High Sensitivity): 7 ng/L (ref <18)
Troponin I (High Sensitivity): 4 ng/L (ref <18)

## 2021-06-09 LAB — HEMOGLOBIN A1C
Hgb A1c MFr Bld: 7.3 % — ABNORMAL HIGH (ref 4.8–5.6)
Mean Plasma Glucose: 162.81 mg/dL

## 2021-06-09 LAB — TSH: TSH: 0.67 u[IU]/mL (ref 0.350–4.500)

## 2021-06-09 MED ORDER — ACETAMINOPHEN 325 MG PO TABS
650.0000 mg | ORAL_TABLET | ORAL | Status: DC | PRN
  Administered 2021-06-11: 650 mg via ORAL
  Filled 2021-06-09: qty 2

## 2021-06-09 MED ORDER — HEPARIN (PORCINE) 25000 UT/250ML-% IV SOLN
850.0000 [IU]/h | INTRAVENOUS | Status: DC
  Administered 2021-06-09: 850 [IU]/h via INTRAVENOUS
  Filled 2021-06-09: qty 250

## 2021-06-09 MED ORDER — HEPARIN BOLUS VIA INFUSION
4000.0000 [IU] | Freq: Once | INTRAVENOUS | Status: AC
  Administered 2021-06-09: 4000 [IU] via INTRAVENOUS
  Filled 2021-06-09: qty 4000

## 2021-06-09 MED ORDER — ALBUTEROL SULFATE (2.5 MG/3ML) 0.083% IN NEBU: 2.5000 mg | INHALATION_SOLUTION | Freq: Four times a day (QID) | RESPIRATORY_TRACT | Status: DC | PRN

## 2021-06-09 MED ORDER — ATORVASTATIN CALCIUM 10 MG PO TABS
10.0000 mg | ORAL_TABLET | Freq: Every day | ORAL | Status: DC
  Administered 2021-06-09 – 2021-06-11 (×3): 10 mg via ORAL
  Filled 2021-06-09 (×3): qty 1

## 2021-06-09 MED ORDER — IOHEXOL 350 MG/ML SOLN
60.0000 mL | Freq: Once | INTRAVENOUS | Status: AC | PRN
  Administered 2021-06-09: 60 mL via INTRAVENOUS

## 2021-06-09 MED ORDER — ALBUTEROL SULFATE HFA 108 (90 BASE) MCG/ACT IN AERS: 1.0000 | INHALATION_SPRAY | Freq: Four times a day (QID) | RESPIRATORY_TRACT | Status: DC | PRN

## 2021-06-09 MED ORDER — SODIUM CHLORIDE 0.9 % IV SOLN: Freq: Once | INTRAVENOUS | Status: AC

## 2021-06-09 MED ORDER — VITAMIN D 25 MCG (1000 UNIT) PO TABS
1000.0000 [IU] | ORAL_TABLET | Freq: Every day | ORAL | Status: DC
  Administered 2021-06-09 – 2021-06-11 (×3): 1000 [IU] via ORAL
  Filled 2021-06-09 (×3): qty 1

## 2021-06-09 MED ORDER — ASPIRIN EC 81 MG PO TBEC: 81.0000 mg | DELAYED_RELEASE_TABLET | Freq: Every day | ORAL | Status: DC

## 2021-06-09 MED ORDER — INSULIN ASPART 100 UNIT/ML IJ SOLN
0.0000 [IU] | Freq: Three times a day (TID) | INTRAMUSCULAR | Status: DC
  Administered 2021-06-10: 2 [IU] via SUBCUTANEOUS

## 2021-06-09 MED ORDER — ALPRAZOLAM 0.25 MG PO TABS: 0.2500 mg | ORAL_TABLET | Freq: Two times a day (BID) | ORAL | Status: DC | PRN

## 2021-06-09 MED ORDER — ASPIRIN 81 MG PO CHEW
324.0000 mg | CHEWABLE_TABLET | ORAL | Status: AC
  Administered 2021-06-09: 324 mg via ORAL
  Filled 2021-06-09: qty 4

## 2021-06-09 MED ORDER — PROSIGHT PO TABS
1.0000 | ORAL_TABLET | Freq: Two times a day (BID) | ORAL | Status: DC
  Administered 2021-06-09 – 2021-06-11 (×3): 1 via ORAL
  Filled 2021-06-09 (×4): qty 1

## 2021-06-09 MED ORDER — ZOLPIDEM TARTRATE 5 MG PO TABS: 5.0000 mg | ORAL_TABLET | Freq: Every evening | ORAL | Status: DC | PRN

## 2021-06-09 MED ORDER — NITROGLYCERIN 0.4 MG SL SUBL: 0.4000 mg | SUBLINGUAL_TABLET | SUBLINGUAL | Status: DC | PRN

## 2021-06-09 MED ORDER — ASPIRIN 300 MG RE SUPP
300.0000 mg | RECTAL | Status: AC
Start: 2021-06-09 — End: 2021-06-09

## 2021-06-09 MED ORDER — PANTOPRAZOLE SODIUM 40 MG PO TBEC
40.0000 mg | DELAYED_RELEASE_TABLET | Freq: Every day | ORAL | Status: DC
  Administered 2021-06-09 – 2021-06-11 (×3): 40 mg via ORAL
  Filled 2021-06-09 (×3): qty 1

## 2021-06-09 MED ORDER — POLYETHYL GLYCOL-PROPYL GLYCOL 0.4-0.3 % OP GEL
Freq: Three times a day (TID) | OPHTHALMIC | Status: DC
  Filled 2021-06-09: qty 10

## 2021-06-09 MED ORDER — SODIUM CHLORIDE 0.9 % IV SOLN: INTRAVENOUS | Status: DC

## 2021-06-09 MED ORDER — INSULIN ASPART 100 UNIT/ML IJ SOLN
0.0000 [IU] | Freq: Every day | INTRAMUSCULAR | Status: DC
Start: 2021-06-09 — End: 2021-06-11

## 2021-06-09 MED ORDER — METOPROLOL SUCCINATE ER 50 MG PO TB24
50.0000 mg | ORAL_TABLET | Freq: Every day | ORAL | Status: DC
  Administered 2021-06-09 – 2021-06-11 (×3): 50 mg via ORAL
  Filled 2021-06-09: qty 2
  Filled 2021-06-09 (×2): qty 1

## 2021-06-09 MED ORDER — ONDANSETRON HCL 4 MG/2ML IJ SOLN: 4.0000 mg | Freq: Four times a day (QID) | INTRAMUSCULAR | Status: DC | PRN

## 2021-06-09 NOTE — ED Provider Notes (Signed)
[facility] EMERGENCY DEPARTMENT Provider Note   CSN: [id number] Arrival date & time: 06/09/21  2536     History Chief Complaint  Patient presents with   Chest Pain    [name] [name] is a 75 y.o. female with a history of hypertension, diabetes mellitus, thoracic ascending aortic aneurysm, CVA, GERD.  Patient presents emergency department with a chief complaint of chest pain.  Patient reports that chest pain started yesterday at approximately 1200 -1300 while she was doing chores around the house.  Patient states that pain has been constant since then.  Pain is midsternal and radiates to the left side of her chest and arm.  Patient rates pain 8/10 on the pain scale.  Pain described as "grabbing pain."  Patient endorses associated shortness of breath and diaphoresis at onset.  Patient denies any nausea or vomiting.  Pain is worse with ambulation or sitting up.  Patient had minimal improvement with Tylenol yesterday.  Patient states that she took 324 mg of aspirin this morning.  Patient reports that she has been having chest pain with ambulation over the last few months.  Patient reports that pain she is experiencing at present feels similar to past episodes.  Patient reports that she has swelling to left ankle on Monday, this has resolved.  Patient reports chronic back pain however denies any change in his pain over the last few days.  Patient denies any tobacco use, hemoptysis, cancer treatment, hormone therapy, surgery in the last 12 weeks, history of DVT or PE, palpitations, abdominal pain.    Per chart review is followed by cardiologist Dr.Skeins.  Last saw cardiology on 6/28 due to recurrent chest pain with exertion.  Patient has nuclear perfusion study planned for 7/25.     Chest Pain Associated symptoms: back pain (chronic and at baseline) and diaphoresis   Associated symptoms: no abdominal pain, no cough, no dizziness, no fever, no headache, no nausea, no  palpitations, no shortness of breath and no vomiting       Past Medical History:  Diagnosis Date   Arthritis    Bronchitis    hx   Chest pain 2009   MV with no scar or ischemia, EF 65%   Complication of anesthesia    claustrophobic !   Diabetes mellitus    GERD (gastroesophageal reflux disease)    History of cardiac catheterization    a. Myoview 2/15 with anteroapical ischemia, EF 64% (intermediate risk) >> LHC - normal cos, EF 55-60%   Hypertension    Neuromuscular disorder (HCC)    arotic Aneurysm   Obesity (BMI 35.0-39.9 without comorbidity)    Pneumonia    Stroke [facility])    with some resid L sided weakness   Thoracic ascending aortic aneurysm (HCC)    a. 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, midline 07/15/2020   Encounter for fitting and adjustment of pessary 07/15/2020   Chronic sinusitis    Complicated migraine    Pure hypercholesterolemia    TIA (transient ischemic attack)    Atypical chest pain    Cerebral infarction (HCC) 08/26/2016   Chronic diastolic heart failure, NYHA class 2 (HCC) 12/07/2015   Pulmonary HTN (HCC) 12/07/2015   Chronic chest pain 12/07/2015   Thoracic aortic aneurysm (HCC) 12/07/2015   Left sided numbness 12/07/2015   Bradycardia 05/06/2012   CVA (cerebral vascular accident) (HCC) 05/05/2012  Obesity due to excess calories 04/25/2012   Cystocele 03/22/2012   Type 2 diabetes mellitus without complication, without long-term current use of insulin (HCC) 03/22/2012   Essential hypertension 03/22/2012   gerd 03/22/2012    Past Surgical History:  Procedure Laterality Date   ABDOMINAL HYSTERECTOMY  2006   Partial, one ovary left    [place]   CHOLECYSTECTOMY     EAR CYST EXCISION Left 09/20/2016   Procedure: EXCISION  LEFT EAR LESION;  Surgeon: Newman PiesSu Teoh, MD;  Location: [facility] OR;  Service: ENT;   Laterality: Left;   LEFT HEART CATHETERIZATION WITH CORONARY ANGIOGRAM N/A 01/09/2014   Procedure: LEFT HEART CATHETERIZATION WITH CORONARY ANGIOGRAM;  Surgeon: Marykay Lexavid W Harding, MD;  [facility];  Service: Cardiovascular;  Laterality: N/A;   MENISCUS REPAIR Right    ROTATOR CUFF REPAIR Left    SKIN SPLIT GRAFT Left 09/20/2016   Procedure: SKIN GRAFT SPLIT THICKNESS;  Surgeon: Newman PiesSu Teoh, MD;  Location: [facility] OR;  Service: ENT;  Laterality: Left;   TEMPOROMANDIBULAR JOINT SURGERY     TONSILLECTOMY     WISDOM TOOTH EXTRACTION       OB History     Gravida  4   Para  4   Term  4   Preterm  0   AB  0   Living  4      SAB  0   IAB  0   Ectopic  0   Multiple  0   Live Births           Obstetric Comments  Delivered 4 large babies vaginally         Family History  Problem Relation Age of Onset   Heart disease Father    Valvular heart disease Father        aortic valve replacement   Pancreatitis Mother    Diabetes Maternal Grandmother    Heart disease Brother    Valvular heart disease Brother        aortic valve replacement   CAD Son        heart attack   Kidney disease Son     Social History   Tobacco Use   Smoking status: Never   Smokeless tobacco: Never  Vaping Use   Vaping Use: Never used  Substance Use Topics   Alcohol use: No   Drug use: No    Home Medications Prior to Admission medications   Medication Sig Start Date End Date Taking? Authorizing Provider  acetaminophen (TYLENOL) 500 MG tablet Take 1 tablet (500 mg total) by mouth every 6 (six) hours as needed. 10/21/19   Fawze, Mina A, PA-C  albuterol (PROVENTIL HFA;VENTOLIN HFA) 108 (90 BASE) MCG/ACT inhaler Inhale 1-2 puffs into the lungs every 6 (six) hours as needed for wheezing. 07/29/13   Reuben LikesKeller, David C, MD  aspirin EC 81 MG tablet Take 81 mg by mouth daily.    [provider]  atorvastatin (LIPITOR) 10 MG tablet Take 10 mg by mouth daily. 08/05/20   [provider]   Cholecalciferol (VITAMIN D3) 1000 units CAPS Take 1 capsule by mouth daily.    [provider]  dapagliflozin propanediol (FARXIGA) 5 MG TABS tablet Take 5 mg by mouth daily.    [provider]  diclofenac Sodium (VOLTAREN) 1 % GEL Apply 2 g topically 2 (two) times daily as needed. 01/11/21   [provider]  glimepiride (AMARYL) 2  MG tablet Take 2 mg by mouth daily with breakfast.    [provider]  linagliptin (TRADJENTA) 5 MG TABS tablet Take 5 mg by mouth daily.    [provider]  lisinopril-hydrochlorothiazide (PRINZIDE,ZESTORETIC) 10-12.5 MG tablet TAKE 1 TABLET BY MOUTH DAILY 12/27/15   Nicholaus Bloom C, MD  metoprolol succinate (TOPROL-XL) 50 MG 24 hr tablet TAKE 1 TABLET BY MOUTH DAILY WITH OR IMMEDIATELY FOLLOWING A MEAL 07/16/17   Weaver, Scott T, PA-C  Multiple Vitamins-Minerals (PRESERVISION AREDS 2) CAPS Take 1 tablet by mouth 2 (two) times daily.    [provider]  Olopatadine HCl 0.2 % SOLN Place 1 drop into both eyes every evening.    [provider]  omeprazole (PRILOSEC) 40 MG capsule Take 40 mg by mouth daily.    [provider]  Polyethyl Glycol-Propyl Glycol (SYSTANE OP) Apply 1 drop to eye 3 (three) times daily.    [provider]  spironolactone (ALDACTONE) 25 MG tablet Take 12.5 mg by mouth daily.    [provider]    Allergies    Adhesive [tape], Codeine, and Empagliflozin  Review of Systems   Review of Systems  Constitutional:  Positive for chills and diaphoresis. Negative for fever.  Eyes:  Negative for visual disturbance.  Respiratory:  Negative for cough and shortness of breath.   Cardiovascular:  Positive for chest pain and leg swelling. Negative for palpitations.  Gastrointestinal:  Negative for abdominal pain, nausea and vomiting.  Genitourinary:  Negative for difficulty urinating and dysuria.  Musculoskeletal:  Positive for back pain (chronic and at baseline). Negative  for neck pain.  Skin:  Negative for color change and rash.  Neurological:  Negative for dizziness, syncope, light-headedness and headaches.  Psychiatric/Behavioral:  Negative for confusion.    Physical Exam Updated Vital Signs BP 104/90 (BP Location: Left Arm)   Pulse 67   Temp 98.2 F (36.8 C) (Oral)   Resp 18   Ht 5\' 3"  (1.6 m)   Wt 80.7 kg   SpO2 98%   BMI 31.53 kg/m   Physical Exam Vitals and nursing note reviewed.  Constitutional:      General: She is not in acute distress.    Appearance: She is not ill-appearing, toxic-appearing or diaphoretic.  HENT:     Head: Normocephalic.  Eyes:     General: No scleral icterus.       Right eye: No discharge.        Left eye: No discharge.  Cardiovascular:     Rate and Rhythm: Normal rate.     Pulses:          Radial pulses are 2+ on the right side and 2+ on the left side.     Heart sounds: Normal heart sounds.  Pulmonary:     Effort: Pulmonary effort is normal. No tachypnea, bradypnea or respiratory distress.     Breath sounds: Normal breath sounds. No stridor.  Abdominal:     General: There is no distension. There are no signs of injury.     Palpations: Abdomen is soft. There is no mass or pulsatile mass.     Tenderness: There is no abdominal tenderness. There is no guarding or rebound.     Hernia: There is no hernia in the umbilical area or ventral area.  Musculoskeletal:     Cervical back: Neck supple.     Right lower leg: No swelling, deformity, lacerations, tenderness or bony tenderness. No edema.     Left  lower leg: No swelling, deformity, lacerations, tenderness or bony tenderness. No edema.     Right ankle: No swelling.     Left ankle: No swelling.     Right foot: No swelling.     Left foot: No swelling.  Skin:    General: Skin is warm and dry.  Neurological:     General: No focal deficit present.     Mental Status: She is alert.  Psychiatric:        Behavior: Behavior is cooperative.    ED Results /  Procedures / Treatments   Labs (all labs ordered are listed, but only abnormal results are displayed) Labs Reviewed  BASIC METABOLIC PANEL - Abnormal; Notable for the following components:      Result Value   CO2 21 (*)    Glucose, Bld 127 (*)    Creatinine, Ser 1.40 (*)    GFR, Estimated 39 (*)    All other components within normal limits  CBG MONITORING, ED - Abnormal; Notable for the following components:   Glucose-Capillary 106 (*)    All other components within normal limits  CBC  CBG MONITORING, ED  TROPONIN I (HIGH SENSITIVITY)  TROPONIN I (HIGH SENSITIVITY)    EKG EKG Interpretation  Date/Time:  Thursday June 09 2021 08:42:17 EDT Ventricular Rate:  62 PR Interval:  150 QRS Duration: 88 QT Interval:  456 QTC Calculation: 462 R Axis:   -31 Text Interpretation: Normal sinus rhythm Left axis deviation ST & T wave abnormality, consider anterior ischemia Abnormal ECG when compared to prior, similar appearance. No STEMI Confirmed by Theda Belfast ([id number]) on 06/09/2021 2:27:17 PM  Radiology DG Chest 2 View  Result Date: 06/09/2021 CLINICAL DATA:  Chest pain. EXAM: CHEST - 2 VIEW COMPARISON:  10/21/2019 FINDINGS: Both lungs are clear. Heart size is stable and within normal limits. Again noted is a tortuous distal descending thoracic aorta. The trachea is midline. Possible post traumatic changes or degenerative changes in the lateral left clavicle. IMPRESSION: No active cardiopulmonary disease. Electronically Signed   By: Richarda Overlie M.D.   On: 06/09/2021 09:44    Procedures Procedures   Medications Ordered in ED Medications - No data to display  ED Course  I have reviewed the triage vital signs and the nursing notes.  Pertinent labs & imaging results that were available during my care of the patient were reviewed by me and considered in my medical decision making (see chart for details).    MDM Rules/Calculators/A&P                           Alert 75 year old female  in no acute distress, non-toxic appearing.  Presents with a chief complaint of chest pain.  Chest pain started yesterday at 10-1299 while performing housework.  Pain has been constant since than.  Pain radiates to left chest/arm, reports diaphoresis at onset.    Per chart review is followed by cardiologist Dr.Skeins.  Last saw cardiology on 6/28 due to recurrent chest pain with exertion.  Patient has nuclear perfusion study planned for 7/25.    Patient has history of thoracic ascending aortic aneurysm.  This has not been repaired previously.  CT angiogram 10/2019 shows ascending aorta 4.1cm  CXR shows no active cardiopulmonary disease. Troponin 3 -->7 EKG shows normal sinus rhythm  Patient with history of aortic aneurysm will obtain CTA of chest/abd/pelvis to evaluate for dissection or worsening aneurysm.  PE considered due to  reports of lower leg swelling earlier this week.    Patient reports improvement in her symptoms.  On serial reevaluation patient is in no acute distress.  1755 spoke to cardiologist Dr. Duke Salvia advised that cardiology team will come see the patient.  Patient care transferred to Dr.Zammitz at the end of my shift. Patient presentation, ED course, and plan of care discussed with review of all pertinent labs and imaging. Please see his/her note for further details regarding further ED course and disposition.    Final Clinical Impression(s) / ED Diagnoses Final diagnoses:  None    Rx / DC Orders ED Discharge Orders     None        Haskel Schroeder, PA-C 06/09/21 1900    Tegeler, Canary Brim, MD 06/10/21 [phone]

## 2021-06-09 NOTE — Progress Notes (Signed)
ANTICOAGULATION CONSULT NOTE - Initial Consult  Pharmacy Consult for heparin Indication: chest pain/ACS  Allergies  Allergen Reactions   Adhesive [Tape] Rash    pls use elastic wraps instead of adhesive tape   Codeine Nausea And Vomiting   Empagliflozin Other (See Comments)    Headaches & lower back pain    Patient Measurements: Height: 5\' 3"  (160 cm) Weight: 80.7 kg (178 lb) IBW/kg (Calculated) : 52.4 Heparin Dosing Weight: 70kg  Vital Signs: Temp: 98.2 F (36.8 C) (07/21 1243) Temp Source: Oral (07/21 1243) BP: 130/89 (07/21 1930) Pulse Rate: 85 (07/21 1930)  Labs: Recent Labs    06/09/21 0848 06/09/21 1456  HGB 13.3  --   HCT 41.2  --   PLT 262  --   CREATININE 1.40*  --   TROPONINIHS 3 7    Estimated Creatinine Clearance: 34.4 mL/min (A) (by C-G formula based on SCr of 1.4 mg/dL (H)).   Medical History: Past Medical History:  Diagnosis Date   Arthritis    Bronchitis    hx   Chest pain 2009   MV with no scar or ischemia, EF 65%   Complication of anesthesia    claustrophobic !   Diabetes mellitus    GERD (gastroesophageal reflux disease)    History of cardiac catheterization    a. Myoview 2/15 with anteroapical ischemia, EF 64% (intermediate risk) >> LHC - normal cos, EF 55-60%   Hypertension    Neuromuscular disorder (HCC)    arotic Aneurysm   Obesity (BMI 35.0-39.9 without comorbidity)    Pneumonia    Stroke [facility])    with some resid L sided weakness   Thoracic ascending aortic aneurysm (HCC)    a. CT 1/17: ascending aorta 4.2 x 4.0 cm    Assessment: 75 YOF presenting with worsening CP, no hx of CAD, she is not on anticoagulation PTA, CBC wnl.  Goal of Therapy:  Heparin level 0.3-0.7 units/ml Monitor platelets by anticoagulation protocol: Yes   Plan:  Heparin 4000 units IV x 1, and gtt at 850 units/hr F/u 8 hour heparin level with AM labs F/u cards plan and LOT  2/17, PharmD Clinical Pharmacist ED Pharmacist Phone #  [phone] 06/09/2021 9:46 PM

## 2021-06-09 NOTE — H&P (Signed)
Cardiology Admission History and Physical:   Patient ID: [name] MRN: [id number]; DOB: [birth year]   Admission date: 06/09/2021  PCP:  [name] Au, MD   [facility] Providers Cardiologist:  [name] Schultz, MD       Chief Complaint: Chest pain  Patient Profile:   [name] [name] is a 75 y.o. female with a history of dilated ascending aorta, prior presentations for chest pain, stroke with residual left-sided weakness, hyperlipidemia, diabetes mellitus type 2 who is being seen 06/09/2021 for the evaluation of chest pain at the request of Dr. Grace [name].  History of Present Illness:   [name] [name] is a 75 year old female with the above-mentioned history who presents with chest pain.  She was helping to move some heavy things yesterday and afterward noted some chest discomfort which is waxing and waning.  She describes this as low substernal radiating up substernally and over to her left arm with a grabbing pain.  She has had presentations for chest pain in the past but notes that this sensation is somewhat different in that it is more intense and has been happening more frequently.  She describes exertional chest pain with ambulation and climbing stairs or hills that will resolve when she rests.  Recently she visited her sister who lives on the third floor and when climbing the stairs she had to stop every fourth step due to central chest pressure and shortness of breath.  These would resolve within a few moments and she was able to keep climbing however she would have recurrence of chest pressure and shortness of breath and have to stop again after 4 stairs.  She denies palpitations, PND, orthopnea, notes mild leg swelling in the left leg that has resolved.  Denies syncope or presyncope, denies nausea, vomiting, diarrhea, abdominal pain, urinary symptoms, cough, fever, chills.  She has a family history of cardiovascular disease and notes that she has some heritable cardiovascular  condition which she is not sure of.  Her brother who saw a cardiologist in [place] had a valve replacement approximately 25 to 30 years ago and was told that he had a cardiovascular condition which his siblings and their children would have to be screened for.  She is unsure if this was a bicuspid aortic valve or what condition this could represent.  She will contact that physician's office tomorrow and she feels they will know the answer to this question.  She does not smoke.  ECG shows normal sinus rhythm with anterior T wave inversions which are present since at least 2020.  Telemetry shows normal sinus rhythm.  A CT angiogram of the chest abdomen and pelvis was performed to rule out dissection in the setting of chest pain and she was noted to have stable mild dilation of the ascending aorta at 4 cm with no findings of note in the abdominal aorta, mild hepatic steatosis, and colonic diverticulosis.    Independently reviewed the images from her echocardiogram performed 02/09/2020 which shows ejection fraction 50 to 55%, and no obvious regional wall motion abnormalities.  Aorta was noted to be dilated at 40 mm.  Past Medical History:  Diagnosis Date   Arthritis    Bronchitis    hx   Chest pain 2009   MV with no scar or ischemia, EF 65%   Complication of anesthesia    claustrophobic !   Diabetes mellitus    GERD (gastroesophageal reflux disease)    History of cardiac catheterization    a. Myoview 2/15  with anteroapical ischemia, EF 64% (intermediate risk) >> LHC - normal cos, EF 55-60%   Hypertension    Neuromuscular disorder (HCC)    arotic Aneurysm   Obesity (BMI 35.0-39.9 without comorbidity)    Pneumonia    Stroke [facility](HCC)    with some resid L sided weakness   Thoracic ascending aortic aneurysm (HCC)    a. CT 1/17: ascending aorta 4.2 x 4.0 cm    Past Surgical History:  Procedure Laterality Date   ABDOMINAL HYSTERECTOMY  2006   Partial, one ovary left    [place]IAC [place]   [name]   CHOLECYSTECTOMY     EAR CYST EXCISION Left 09/20/2016   Procedure: EXCISION  LEFT EAR LESION;  Surgeon: [name] PiesSu Teoh, MD;  Location: [facility] OR;  Service: ENT;  Laterality: Left;   LEFT HEART CATHETERIZATION WITH CORONARY ANGIOGRAM N/A 01/09/2014   Procedure: LEFT HEART CATHETERIZATION WITH CORONARY ANGIOGRAM;  Surgeon: Marykay Lexavid W Harding, MD;  [facility];  Service: Cardiovascular;  Laterality: N/A;   MENISCUS REPAIR Right    ROTATOR CUFF REPAIR Left    SKIN SPLIT GRAFT Left 09/20/2016   Procedure: SKIN GRAFT SPLIT THICKNESS;  Surgeon: [name] PiesSu Teoh, MD;  Location: [facility] OR;  Service: ENT;  Laterality: Left;   TEMPOROMANDIBULAR JOINT SURGERY     TONSILLECTOMY     WISDOM TOOTH EXTRACTION       Medications Prior to Admission: Prior to Admission medications   Medication Sig Start Date End Date Taking? Authorizing Provider  acetaminophen (TYLENOL) 500 MG tablet Take 1 tablet (500 mg total) by mouth every 6 (six) hours as needed. 10/21/19  Yes Fawze, Mina A, PA-C  albuterol (PROVENTIL HFA;VENTOLIN HFA) 108 (90 BASE) MCG/ACT inhaler Inhale 1-2 puffs into the lungs every 6 (six) hours as needed for wheezing. 07/29/13  Yes Reuben LikesKeller, David C, MD  aspirin EC 81 MG tablet Take 81 mg by mouth daily.   Yes [provider]  atorvastatin (LIPITOR) 10 MG tablet Take 10 mg by mouth daily. 08/05/20  Yes [provider]  Cholecalciferol (VITAMIN D3) 1000 units CAPS Take 1 capsule by mouth daily.   Yes [provider]  dapagliflozin propanediol (FARXIGA) 5 MG TABS tablet Take 5 mg by mouth daily.   Yes [provider]  linagliptin (TRADJENTA) 5 MG TABS tablet Take 5 mg by mouth daily.   Yes [provider]  lisinopril-hydrochlorothiazide (PRINZIDE,ZESTORETIC) 10-12.5 MG tablet TAKE 1 TABLET BY MOUTH DAILY Patient taking differently: Take 1 tablet by mouth daily. 12/27/15  Yes Dove, Myra C, MD  metoprolol succinate (TOPROL-XL) 50 MG 24 hr tablet  TAKE 1 TABLET BY MOUTH DAILY WITH OR IMMEDIATELY FOLLOWING A MEAL Patient taking differently: Take 50 mg by mouth daily. 07/16/17  Yes Weaver, Scott T, PA-C  Multiple Vitamins-Minerals (PRESERVISION AREDS 2) CAPS Take 1 tablet by mouth 2 (two) times daily.   Yes [provider]  omeprazole (PRILOSEC) 40 MG capsule Take 40 mg by mouth daily.   Yes [provider]  Polyethyl Glycol-Propyl Glycol (SYSTANE OP) Apply 1 drop to eye 3 (three) times daily.   Yes [provider]  spironolactone (ALDACTONE) 25 MG tablet Take 12.5 mg by mouth daily.   Yes [provider]     Allergies:    Allergies  Allergen Reactions   Adhesive [Tape] Rash    pls use elastic wraps instead of adhesive tape   Codeine Nausea And Vomiting   Empagliflozin Other (See  Comments)    Headaches & lower back pain    Social History:   Social History   Socioeconomic History   Marital status: Widowed    Spouse name: Not on file   Number of children: Not on file   Years of education: Not on file   Highest education level: Not on file  Occupational History   Occupation: Caregiver for the elderly    Employer: RETIRED  Tobacco Use   Smoking status: Never   Smokeless tobacco: Never  Vaping Use   Vaping Use: Never used  Substance and Sexual Activity   Alcohol use: No   Drug use: No   Sexual activity: Not Currently    Birth control/protection: Post-menopausal  Other Topics Concern   Not on file  Social History Narrative   Family History Details: Her mother died at age 62 of pancreatitis. Her father died of mitral valve disease at age 66. She has a brother who has had myocardial infarction and also has had bypass surgery and a sister who    has a pacemaker.         Social Determinants of Health   Financial Resource Strain: Not on file  Food Insecurity: Not on file  Transportation Needs: Not on file  Physical Activity: Not on file  Stress: Not on file  Social Connections: Not  on file  Intimate Partner Violence: Not on file    Family History:   The patient's family history includes CAD in her son; Diabetes in her maternal grandmother; Heart disease in her brother and father; Kidney disease in her son; Pancreatitis in her mother; Valvular heart disease in her brother and father.    ROS:  Please see the history of present illness.  All other ROS reviewed and negative.     Physical Exam/Data:   Vitals:   06/09/21 1800 06/09/21 1815 06/09/21 1830 06/09/21 1900  BP: 113/70 113/79 116/88 114/87  Pulse: 79 81 79 83  Resp: 18 (!) 22 16 20   Temp:      TempSrc:      SpO2: 98% 97% 100% 99%  Weight:      Height:       No intake or output data in the 24 hours ending 06/09/21 1940 Last 3 Weights 06/09/2021 05/17/2021 08/31/2020  Weight (lbs) 178 lb 175 lb 177 lb 9.6 oz  Weight (kg) 80.74 kg 79.379 kg 80.559 kg     Body mass index is 31.53 kg/m.  General:  Well nourished, well developed, in no acute distress HEENT: normal Lymph: no adenopathy Neck: JVP is seen just above the clavicle at 45 degrees. Cardiac:  normal S1, S2; RRR; no murmur  Lungs:  clear to auscultation bilaterally, no wheezing, rhonchi or rales  Abd: soft, nontender, no hepatomegaly  Ext: no edema Musculoskeletal:  No deformities, BUE and BLE strength normal and equal Skin: warm and dry  Neuro:  CNs 2-12 intact, no focal abnormalities noted Psych:  Normal affect    EKG:  The ECG that was done today was personally reviewed and demonstrates sinus rhythm with anterior T wave inversions  Relevant CV Studies: None yet this admission, otherwise noted in the HPI  Laboratory Data:  High Sensitivity Troponin:   Recent Labs  Lab 06/09/21 0848 06/09/21 1456  TROPONINIHS 3 7      Chemistry Recent Labs  Lab 06/09/21 0848  NA 138  K 3.8  CL 107  CO2 21*  GLUCOSE 127*  BUN 23  CREATININE 1.40*  CALCIUM 9.6  GFRNONAA 39*  ANIONGAP 10    No results for input(s): PROT, ALBUMIN, AST,  ALT, ALKPHOS, BILITOT in the last 168 hours. Hematology Recent Labs  Lab 06/09/21 0848  WBC 6.4  RBC 4.45  HGB 13.3  HCT 41.2  MCV 92.6  MCH 29.9  MCHC 32.3  RDW 14.0  PLT 262   BNPNo results for input(s): BNP, PROBNP in the last 168 hours.  DDimer No results for input(s): DDIMER in the last 168 hours.   Radiology/Studies:  DG Chest 2 View  Result Date: 06/09/2021 CLINICAL DATA:  Chest pain. EXAM: CHEST - 2 VIEW COMPARISON:  10/21/2019 FINDINGS: Both lungs are clear. Heart size is stable and within normal limits. Again noted is a tortuous distal descending thoracic aorta. The trachea is midline. Possible post traumatic changes or degenerative changes in the lateral left clavicle. IMPRESSION: No active cardiopulmonary disease. Electronically Signed   By: Richarda Overlie M.D.   On: 06/09/2021 09:44   CT Angio Chest/Abd/Pel for Dissection W and/or Wo Contrast  Result Date: 06/09/2021 CLINICAL DATA:  Abdominal pain, known thoracic aortic aneurysm EXAM: CT ANGIOGRAPHY CHEST, ABDOMEN AND PELVIS TECHNIQUE: Non-contrast CT of the chest was initially obtained. Multidetector CT imaging through the chest, abdomen and pelvis was performed using the standard protocol during bolus administration of intravenous contrast. Multiplanar reconstructed images and MIPs were obtained and reviewed to evaluate the vascular anatomy. CONTRAST:  60mL OMNIPAQUE IOHEXOL 350 MG/ML SOLN COMPARISON:  10/21/2019 FINDINGS: CTA CHEST FINDINGS Cardiovascular: Ascending thoracic aorta is again identified measuring approximately 4.0 cm. No evidence of dissection. Minimal atherosclerosis of the aortic arch. Ectasia of the descending thoracic aorta. The heart is unremarkable without pericardial effusion. There is suboptimal opacification of the pulmonary vasculature to exclude pulmonary emboli. Mediastinum/Nodes: No enlarged mediastinal, hilar, or axillary lymph nodes. Thyroid gland, trachea, and esophagus demonstrate no significant  findings. Lungs/Pleura: No acute airspace disease, effusion, or pneumothorax. Stable subpleural scarring within the left lower lobe. Central airways are patent. Musculoskeletal: There are no acute or destructive bony lesions. Reconstructed images demonstrate no additional findings. Review of the MIP images confirms the above findings. CTA ABDOMEN AND PELVIS FINDINGS VASCULAR Aorta: Normal caliber aorta without aneurysm, dissection, vasculitis or significant stenosis. Celiac: Patent without evidence of aneurysm, dissection, vasculitis or significant stenosis. SMA: Patent without evidence of aneurysm, dissection, vasculitis or significant stenosis. Renals: Both renal arteries are patent without evidence of aneurysm, dissection, vasculitis, fibromuscular dysplasia or significant stenosis. Minimal atherosclerosis at the origin of the left renal artery without significant stenosis. IMA: Patent without evidence of aneurysm, dissection, vasculitis or significant stenosis. Inflow: Patent without evidence of aneurysm, dissection, vasculitis or significant stenosis. Minimal atherosclerosis. Veins: No obvious venous abnormality within the limitations of this arterial phase study. Review of the MIP images confirms the above findings. NON-VASCULAR Hepatobiliary: Mild diffuse hepatic steatosis. No focal liver abnormality. Gallbladder is surgically absent. Pancreas: Unremarkable. No pancreatic ductal dilatation or surrounding inflammatory changes. Spleen: Normal in size without focal abnormality. Adrenals/Urinary Tract: Adrenal glands are unremarkable. Kidneys are normal, without renal calculi, focal lesion, or hydronephrosis. Bladder is unremarkable. Stomach/Bowel: No bowel obstruction or ileus. Normal appendix right lower quadrant. Diffuse colonic diverticulosis without diverticulitis. Lymphatic: No pathologic adenopathy within the abdomen or pelvis. Reproductive: Status post hysterectomy. No adnexal masses. Other: No free fluid  or free gas.  No abdominal wall hernia. Musculoskeletal: There are no acute or destructive bony lesions. Reconstructed images demonstrate no additional findings. Review of the MIP images confirms the above  findings. IMPRESSION: 1. Stable 4.0 cm ascending thoracic aortic aneurysm. No evidence of dissection. Recommend annual imaging followup by CTA or MRA. This recommendation follows 2010 ACCF/AHA/AATS/ACR/ASA/SCA/SCAI/SIR/STS/SVM Guidelines for the Diagnosis and Management of Patients with Thoracic Aortic Disease. Circulation. 2010; 121: Z610-R604. Aortic aneurysm NOS (ICD10-I71.9) 2. Unremarkable abdominal aorta. No evidence of aneurysm or dissection. 3. No acute intrathoracic, intra-abdominal, or intrapelvic process. 4. Mild hepatic steatosis. 5. Colonic diverticulosis without diverticulitis. 6.  Aortic Atherosclerosis (ICD10-I70.0). Electronically Signed   By: Sharlet Salina M.D.   On: 06/09/2021 17:00     Assessment and Plan:   Chest pain- I have independently reviewed the images from the CT scan as well as her CT angio chest abdomen pelvis from 2020, 2018, 2017.  When reviewing the myocardium on this contrast series of exams, there appears to be an apical perfusion defect in the left ventricle.  It is seen serially across the studies but appears worse on today's exam.  On chart review I see she has had a cardiac catheterization in 2015 with no coronary artery disease.  I am concerned with her history of diabetes there may be progression of CAD in the interim.  I am suspicious that her apical perfusion defect noted on CT is representative of obstructive CAD and prior infarct or current ischemia.  It was present previously suggesting more likely the former.  She was scheduled to have a myocardial perfusion study in several days but presented to the hospital with worsening chest pain.  Her symptoms sound consistent with cardiac chest pain and with perfusion defect seen on CT scan, I do not think that  interval stress testing will be of additional benefit at this point.  Fortunately her troponins have been normal and laboratory studies are otherwise relatively unremarkable aside from creatinine of 1.4 which is elevated from her most recent value of 1.1 last year.  The patient and I participated in shared decision-making and determined that at this point the next best step would be coronary angiography to ensure no coronary artery disease.  I do not clearly see coronary artery calcifications on her serial CT scans, and it is possible she may not have obstructive CAD.  Nevertheless with the perfusion defect noted, I would like to be sure particularly in light of her multiple presentations for chest pain.  She does have risk factors including hyperlipidemia, and diabetes.  Hyperlipidemia-continue atorvastatin 10 mg daily.  Most recent lipid panel shows LDL of 62, triglycerides 94, HDL 41.  Lipid panel from 4 months ago.  DM2-she is on Farxiga 5 mg daily, Tradjenta.  We will use Farxiga and SSI in hospital.  Continue patient's other home meds at this time.  Risk Assessment/Risk Scores:    HEAR Score (for undifferentiated chest pain):  HEAR Score: 6       Severity of Illness: The appropriate patient status for this patient is OBSERVATION. Observation status is judged to be reasonable and necessary in order to provide the required intensity of service to ensure the patient's safety. The patient's presenting symptoms, physical exam findings, and initial radiographic and laboratory data in the context of their medical condition is felt to place them at decreased risk for further clinical deterioration. Furthermore, it is anticipated that the patient will be medically stable for discharge from the hospital within 2 midnights of admission. The following factors support the patient status of observation.   " The patient's presenting symptoms include chest pain. " The physical exam findings include normal  cardiac exam. "  The initial radiographic and laboratory data are consistent with no acute aortic syndrome or ACS.   For questions or updates, please contact [facility] Please consult [email or web address] for contact info under     Signed, Parke Poisson, MD  06/09/2021 7:40 PM

## 2021-06-09 NOTE — ED Triage Notes (Signed)
Pt arrives EMS from home with c/o of centralized CP, non radiating. Began 06/08/21. History per EMS of [facility] Endorses SOB, denies vomiting, no dizziness.  BP 134/72 Hr 54 99% RA  CBG 151

## 2021-06-09 NOTE — ED Notes (Signed)
Patient transported to CT 

## 2021-06-10 ENCOUNTER — Encounter (HOSPITAL_COMMUNITY): Payer: Self-pay | Admitting: Internal Medicine

## 2021-06-10 ENCOUNTER — Encounter (HOSPITAL_COMMUNITY)
Admission: EM | Disposition: A | Discharge status: Home / Self Care | Payer: Self-pay | Source: Home / Self Care | Attending: Internal Medicine

## 2021-06-10 ENCOUNTER — Inpatient Hospital Stay (HOSPITAL_COMMUNITY): Payer: Medicare Other

## 2021-06-10 DIAGNOSIS — I77810 Thoracic aortic ectasia: Secondary | ICD-10-CM

## 2021-06-10 DIAGNOSIS — I200 Unstable angina: Secondary | ICD-10-CM

## 2021-06-10 DIAGNOSIS — E876 Hypokalemia: Secondary | ICD-10-CM

## 2021-06-10 DIAGNOSIS — E785 Hyperlipidemia, unspecified: Secondary | ICD-10-CM

## 2021-06-10 DIAGNOSIS — E119 Type 2 diabetes mellitus without complications: Secondary | ICD-10-CM

## 2021-06-10 DIAGNOSIS — I2510 Atherosclerotic heart disease of native coronary artery without angina pectoris: Secondary | ICD-10-CM

## 2021-06-10 DIAGNOSIS — R079 Chest pain, unspecified: Secondary | ICD-10-CM

## 2021-06-10 HISTORY — PX: LEFT HEART CATH AND CORONARY ANGIOGRAPHY: CATH118249

## 2021-06-10 HISTORY — DX: Hypokalemia: E87.6

## 2021-06-10 LAB — ECHOCARDIOGRAM COMPLETE
AR max vel: 3.58 cm2
AV Area VTI: 3.35 cm2
AV Area mean vel: 3.43 cm2
AV Mean grad: 4 mmHg
AV Peak grad: 6.9 mmHg
Ao pk vel: 1.31 m/s
Area-P 1/2: 3.48 cm2
Height: 63 in
MV M vel: 4.76 m/s
MV Peak grad: 90.6 mmHg
Radius: 0.4 cm
S' Lateral: 2.8 cm
Single Plane A4C EF: 65.3 %
Weight: 2932.8 oz

## 2021-06-10 LAB — CBC
HCT: 39.7 % (ref 36.0–46.0)
Hemoglobin: 13 g/dL (ref 12.0–15.0)
MCH: 30.3 pg (ref 26.0–34.0)
MCHC: 32.7 g/dL (ref 30.0–36.0)
MCV: 92.5 fL (ref 80.0–100.0)
Platelets: 222 10*3/uL (ref 150–400)
RBC: 4.29 MIL/uL (ref 3.87–5.11)
RDW: 14.4 % (ref 11.5–15.5)
WBC: 6.8 10*3/uL (ref 4.0–10.5)
nRBC: 0.3 % — ABNORMAL HIGH (ref 0.0–0.2)

## 2021-06-10 LAB — COMPREHENSIVE METABOLIC PANEL
ALT: 9 U/L (ref 0–44)
AST: 12 U/L — ABNORMAL LOW (ref 15–41)
Albumin: 3.1 g/dL — ABNORMAL LOW (ref 3.5–5.0)
Alkaline Phosphatase: 82 U/L (ref 38–126)
Anion gap: 6 (ref 5–15)
BUN: 20 mg/dL (ref 8–23)
CO2: 22 mmol/L (ref 22–32)
Calcium: 9.3 mg/dL (ref 8.9–10.3)
Chloride: 110 mmol/L (ref 98–111)
Creatinine, Ser: 1.08 mg/dL — ABNORMAL HIGH (ref 0.44–1.00)
GFR, Estimated: 53 mL/min — ABNORMAL LOW (ref >60)
Glucose, Bld: 144 mg/dL — ABNORMAL HIGH (ref 70–99)
Potassium: 3.4 mmol/L — ABNORMAL LOW (ref 3.5–5.1)
Sodium: 138 mmol/L (ref 135–145)
Total Bilirubin: 0.8 mg/dL (ref 0.3–1.2)
Total Protein: 6 g/dL — ABNORMAL LOW (ref 6.5–8.1)

## 2021-06-10 LAB — TROPONIN I (HIGH SENSITIVITY): Troponin I (High Sensitivity): 4 ng/L (ref <18)

## 2021-06-10 LAB — LIPID PANEL
Cholesterol: 113 mg/dL (ref 0–200)
HDL: 34 mg/dL — ABNORMAL LOW (ref >40)
LDL Cholesterol: 63 mg/dL (ref 0–99)
Total CHOL/HDL Ratio: 3.3 RATIO
Triglycerides: 78 mg/dL (ref <150)
VLDL: 16 mg/dL (ref 0–40)

## 2021-06-10 LAB — GLUCOSE, CAPILLARY
Glucose-Capillary: 124 mg/dL — ABNORMAL HIGH (ref 70–99)
Glucose-Capillary: 70 mg/dL (ref 70–99)
Glucose-Capillary: 115 mg/dL — ABNORMAL HIGH (ref 70–99)
Glucose-Capillary: 98 mg/dL (ref 70–99)
Glucose-Capillary: 140 mg/dL — ABNORMAL HIGH (ref 70–99)

## 2021-06-10 LAB — HEPARIN LEVEL (UNFRACTIONATED): Heparin Unfractionated: 0.6 IU/mL (ref 0.30–0.70)

## 2021-06-10 LAB — SARS CORONAVIRUS 2 (TAT 6-24 HRS): SARS Coronavirus 2: NEGATIVE

## 2021-06-10 SURGERY — LEFT HEART CATH AND CORONARY ANGIOGRAPHY
Anesthesia: LOCAL

## 2021-06-10 MED ORDER — SODIUM CHLORIDE 0.9 % IV SOLN: 250.0000 mL | INTRAVENOUS | Status: DC | PRN

## 2021-06-10 MED ORDER — IOHEXOL 350 MG/ML SOLN
INTRAVENOUS | Status: DC | PRN
  Administered 2021-06-10: 40 mL

## 2021-06-10 MED ORDER — FENTANYL CITRATE (PF) 100 MCG/2ML IJ SOLN
INTRAMUSCULAR | Status: AC
  Filled 2021-06-10: qty 2

## 2021-06-10 MED ORDER — ASPIRIN 81 MG PO CHEW
81.0000 mg | CHEWABLE_TABLET | ORAL | Status: AC
  Administered 2021-06-10: 81 mg via ORAL
  Filled 2021-06-10: qty 1

## 2021-06-10 MED ORDER — LABETALOL HCL 5 MG/ML IV SOLN
10.0000 mg | INTRAVENOUS | Status: AC | PRN
Start: 2021-06-10 — End: 2021-06-10

## 2021-06-10 MED ORDER — VERAPAMIL HCL 2.5 MG/ML IV SOLN
INTRAVENOUS | Status: DC | PRN
  Administered 2021-06-10: 10 mL via INTRA_ARTERIAL

## 2021-06-10 MED ORDER — LIDOCAINE HCL (PF) 1 % IJ SOLN
INTRAMUSCULAR | Status: DC | PRN
  Administered 2021-06-10: 2 mL

## 2021-06-10 MED ORDER — FENTANYL CITRATE (PF) 100 MCG/2ML IJ SOLN
INTRAMUSCULAR | Status: DC | PRN
  Administered 2021-06-10: 25 ug via INTRAVENOUS

## 2021-06-10 MED ORDER — SODIUM CHLORIDE 0.9% FLUSH: 3.0000 mL | INTRAVENOUS | Status: DC | PRN

## 2021-06-10 MED ORDER — HEPARIN SODIUM (PORCINE) 1000 UNIT/ML IJ SOLN
INTRAMUSCULAR | Status: AC
  Filled 2021-06-10: qty 1

## 2021-06-10 MED ORDER — HYDRALAZINE HCL 20 MG/ML IJ SOLN: 10.0000 mg | INTRAMUSCULAR | Status: AC | PRN

## 2021-06-10 MED ORDER — HEPARIN (PORCINE) IN NACL 1000-0.9 UT/500ML-% IV SOLN
INTRAVENOUS | Status: AC
  Filled 2021-06-10: qty 1000

## 2021-06-10 MED ORDER — MIDAZOLAM HCL 2 MG/2ML IJ SOLN
INTRAMUSCULAR | Status: DC | PRN
  Administered 2021-06-10: 1 mg via INTRAVENOUS

## 2021-06-10 MED ORDER — LIDOCAINE HCL (PF) 1 % IJ SOLN
INTRAMUSCULAR | Status: AC
  Filled 2021-06-10: qty 30

## 2021-06-10 MED ORDER — POTASSIUM CHLORIDE CRYS ER 20 MEQ PO TBCR
40.0000 meq | EXTENDED_RELEASE_TABLET | Freq: Once | ORAL | Status: AC
  Administered 2021-06-10: 40 meq via ORAL
  Filled 2021-06-10: qty 2

## 2021-06-10 MED ORDER — HEPARIN SODIUM (PORCINE) 1000 UNIT/ML IJ SOLN
INTRAMUSCULAR | Status: DC | PRN
  Administered 2021-06-10: 4000 [IU] via INTRAVENOUS

## 2021-06-10 MED ORDER — SODIUM CHLORIDE 0.9% FLUSH
3.0000 mL | Freq: Two times a day (BID) | INTRAVENOUS | Status: DC
  Administered 2021-06-10: 3 mL via INTRAVENOUS

## 2021-06-10 MED ORDER — VERAPAMIL HCL 2.5 MG/ML IV SOLN
INTRAVENOUS | Status: AC
  Filled 2021-06-10: qty 2

## 2021-06-10 MED ORDER — SODIUM CHLORIDE 0.9 % IV SOLN: INTRAVENOUS | Status: AC

## 2021-06-10 MED ORDER — MIDAZOLAM HCL 2 MG/2ML IJ SOLN
INTRAMUSCULAR | Status: AC
  Filled 2021-06-10: qty 2

## 2021-06-10 MED ORDER — ARTIFICIAL TEARS OPHTHALMIC OINT
TOPICAL_OINTMENT | Freq: Three times a day (TID) | OPHTHALMIC | Status: DC
  Filled 2021-06-10: qty 3.5

## 2021-06-10 MED ORDER — ASPIRIN 81 MG PO CHEW: 81.0000 mg | CHEWABLE_TABLET | ORAL | Status: DC

## 2021-06-10 MED ORDER — ASPIRIN EC 81 MG PO TBEC
81.0000 mg | DELAYED_RELEASE_TABLET | Freq: Every day | ORAL | Status: DC
  Administered 2021-06-11: 81 mg via ORAL
  Filled 2021-06-10: qty 1

## 2021-06-10 MED ORDER — SODIUM CHLORIDE 0.9 % IV SOLN: INTRAVENOUS | Status: DC

## 2021-06-10 MED ORDER — HEPARIN (PORCINE) IN NACL 1000-0.9 UT/500ML-% IV SOLN
INTRAVENOUS | Status: DC | PRN
  Administered 2021-06-10 (×2): 500 mL

## 2021-06-10 SURGICAL SUPPLY — 9 items

## 2021-06-10 NOTE — Interval H&P Note (Signed)
History and Physical Interval Note:  06/10/2021 4:51 PM  [name] [name]  has presented today for surgery, with the diagnosis of unstable angina.  The various methods of treatment have been discussed with the patient and family. After consideration of risks, benefits and other options for treatment, the patient has consented to  Procedure(s): LEFT HEART CATH AND CORONARY ANGIOGRAPHY (N/A) as a surgical intervention.  The patient's history has been reviewed, patient examined, no change in status, stable for surgery.  I have reviewed the patient's chart and labs.  Questions were answered to the patient's satisfaction.     Tonny Bollman

## 2021-06-10 NOTE — Progress Notes (Addendum)
ANTICOAGULATION CONSULT NOTE - Follow-up Consult  Pharmacy Consult for heparin Indication: chest pain/ACS  Allergies  Allergen Reactions   Adhesive [Tape] Rash    pls use elastic wraps instead of adhesive tape   Codeine Nausea And Vomiting   Empagliflozin Other (See Comments)    Headaches & lower back pain    Patient Measurements: Height: 5\' 3"  (160 cm) Weight: 83.1 kg (183 lb 4.8 oz) IBW/kg (Calculated) : 52.4 Heparin Dosing Weight: 70kg  Vital Signs: Temp: 98 F (36.7 C) (07/22 0805) Temp Source: Oral (07/22 0805) BP: 112/76 (07/22 0805) Pulse Rate: 50 (07/22 0805)  Labs: Recent Labs    06/09/21 0848 06/09/21 1456 06/09/21 2203 06/10/21 0422  HGB 13.3  --   --  13.0  HCT 41.2  --   --  39.7  PLT 262  --   --  222  HEPARINUNFRC  --   --   --  0.60  CREATININE 1.40*  --   --  1.08*  TROPONINIHS 3 7 4 4      Estimated Creatinine Clearance: 45.3 mL/min (A) (by C-G formula based on SCr of 1.08 mg/dL (H)).   Medical History: Past Medical History:  Diagnosis Date   Arthritis    Bronchitis    hx   Chest pain 2009   MV with no scar or ischemia, EF 65%   Complication of anesthesia    claustrophobic !   Diabetes mellitus    GERD (gastroesophageal reflux disease)    History of cardiac catheterization    a. Myoview 2/15 with anteroapical ischemia, EF 64% (intermediate risk) >> LHC - normal cos, EF 55-60%   Hypertension    Neuromuscular disorder (HCC)    arotic Aneurysm   Obesity (BMI 35.0-39.9 without comorbidity)    Pneumonia    Stroke [facility])    with some resid L sided weakness   Thoracic ascending aortic aneurysm (HCC)    a. CT 1/17: ascending aorta 4.2 x 4.0 cm    Assessment: 75 YOF presenting with worsening CP, no hx of CAD. Planned heart cath 7/22 to assess for CAD given risk factors and family hx.  Heparin level 0.60 and therapeutic. Hgb/hct and Plt WNL. No signs/sx of bleeding or issues with infusion per RN.   Goal of Therapy:  Heparin level  0.3-0.7 units/ml Monitor platelets by anticoagulation protocol: Yes   Plan:  Continue IV heparin gtt at 850 units/hr Daily heparin level, CBC Monitor s/sx of bleeding F/u cards recs on LOT  Thank you for involving pharmacy in this patient's care.  2/17, PharmD PGY1 Ambulatory Care Pharmacy Resident 06/10/2021 8:54 AM  **Pharmacist phone directory can be found on [email or web address] listed under [facility]**

## 2021-06-10 NOTE — Discharge Summary (Signed)
Discharge Summary    Patient ID: [name] MRN: [id number]; DOB: 1946/03/16  Admit date: 06/09/2021 Discharge date: 06/11/2021  PCP:  Verlon AuBoyd, Tammy [name], MD   [facility] Providers Cardiologist:  Donato SchultzMark Skains, MD   {  Discharge Diagnoses    Principal Problem:   Chest pain Active Problems:   Type 2 diabetes mellitus without complication, without long-term current use of insulin (HCC)   Aortic root dilation (HCC)   Hypokalemia  Diagnostic Studies/Procedures    LHC 06/10/21:  Widely patent coronary arteries with minimal irregularities in the LAD and LCx, angiographically normal left main and RCA. Normal LVEDP Tortuous right subclavian artery   Suspect non-cardiac chest pain   Diagnostic Dominance: Right      Echocardiogram 06/10/21:  1. Compared to 02/09/20, findings are similar.   2. Left ventricular ejection fraction, by estimation, is 55 to 60%. The  left ventricle has normal function. The left ventricle has no regional  wall motion abnormalities. Left ventricular diastolic parameters are  consistent with Grade I diastolic  dysfunction (impaired relaxation).   3. Right ventricular systolic function is normal. The right ventricular  size is normal. There is mildly elevated pulmonary artery systolic  pressure.   4. The mitral valve is normal in structure. Mild mitral valve  regurgitation. No evidence of mitral stenosis.   5. The aortic valve is tricuspid. Aortic valve regurgitation is trivial.  No aortic stenosis is present.   6. Aortic dilatation noted. There is mild dilatation of the ascending  aorta and of the aortic root, measuring 38 mm.   7. The inferior vena cava is normal in size with greater than 50%  respiratory variability, suggesting right atrial pressure of 3 mmHg.    History of Present Illness     [name] is a 75 y.o. female with past medical history of dilated ascending aorta, prior presentations for chest pain, prior CVA (with  residual left-sided weakness), HLD andType 2 DM who was seen as a hospital admission 06/09/2021 for the evaluation of chest pain.   Ms. Leffel reported she was helping to move some heavy things the day of admission and experienced chest discomfort which was waxing and waning. She described this as low substernal radiating pain to her left arm. She stated she had experienced similar sensations in the past but noted that this sensation was somewhat different in that it is more intense and had been happening more frequently.  Hospital Course     Her Hs Troponin values remained negative and EKG on admission showed NSR with anterior TWI which were present since at least 2020. CTA of the chest abdomen and pelvis was performed to rule out dissection in the setting of chest pain and she was noted to have stable mild dilation of the ascending aorta at 4.0 cm, mild hepatic steatosis and colonic diverticulosis. Echocardiogram showed a preserved EF of 55-60% with no regional WMA. She was noted to have Grade 1 DD, mild MR, trivial AI and mild dilation of the ascending aorta and root at 38mm. Given her presenting symptoms and concern for apical perfusion defect by prior CT imaging, a cardiac catheterization was recommended for definitive evaluation. Her cath was performed by Dr. Excell Seltzerooper on 06/10/2021 and showed widely patent coronary arteries with minimal irregularities in the LAD and LCx, with normal LM and RCA. LVEDP was normal and she was noted to have a tortuous right subclavian artery.   The following morning, she denied any recurrent chest pain. Radial  cath site was stable without ecchymosis or evidence of a hematoma. Repeat labs showed stable renal function and K+ had normalized. She was last examined by Dr. Wyline Mood and deemed stable for discharge. It was recommended she remain off Spironolactone and Lisinopril-HCTZ at the time of discharge given her soft BP readings and could restart as an outpatient if needed. A staff  message has been sent to the [facility] office to arrange for Cardiology follow-up.   Consultants: None     Did the patient have an acute coronary syndrome (MI, NSTEMI, STEMI, etc) this admission?:  No                               Did the patient have a percutaneous coronary intervention (stent / angioplasty)?:  No.    _____________  Discharge Vitals Blood pressure 109/65, pulse 70, temperature 98.1 F (36.7 C), temperature source Oral, resp. rate 18, height 5\' 3"  (1.6 m), weight 80.6 kg, SpO2 100 %.  Filed Weights   06/09/21 0842 06/10/21 0315 06/11/21 0443  Weight: 80.7 kg 83.1 kg 80.6 kg    Labs & Radiologic Studies    CBC Recent Labs    06/09/21 0848 06/10/21 0422  WBC 6.4 6.8  HGB 13.3 13.0  HCT 41.2 39.7  MCV 92.6 92.5  PLT 262 222   Basic Metabolic Panel Recent Labs    06/12/21 0422 06/11/21 0802  NA 138 138  K 3.4* 4.1  CL 110 111  CO2 22 21*  GLUCOSE 144* 126*  BUN 20 17  CREATININE 1.08* 1.15*  CALCIUM 9.3 9.1   Liver Function Tests Recent Labs    06/10/21 0422  AST 12*  ALT 9  ALKPHOS 82  BILITOT 0.8  PROT 6.0*  ALBUMIN 3.1*   No results for input(s): LIPASE, AMYLASE in the last 72 hours. High Sensitivity Troponin:   Recent Labs  Lab 06/09/21 0848 06/09/21 1456 06/09/21 2203 06/10/21 0422  TROPONINIHS 3 7 4 4     BNP Invalid input(s): POCBNP D-Dimer No results for input(s): DDIMER in the last 72 hours. Hemoglobin A1C Recent Labs    06/09/21 2203  HGBA1C 7.3*   Fasting Lipid Panel Recent Labs    06/10/21 0422  CHOL 113  HDL 34*  LDLCALC 63  TRIG 78  CHOLHDL 3.3   Thyroid Function Tests Recent Labs    06/09/21 2203  TSH 0.670   _____________  DG Chest 2 View  Result Date: 06/09/2021 CLINICAL DATA:  Chest pain. EXAM: CHEST - 2 VIEW COMPARISON:  10/21/2019 FINDINGS: Both lungs are clear. Heart size is stable and within normal limits. Again noted is a tortuous distal descending thoracic aorta. The trachea is  midline. Possible post traumatic changes or degenerative changes in the lateral left clavicle. IMPRESSION: No active cardiopulmonary disease. Electronically Signed   By: 06/11/2021 M.D.   On: 06/09/2021 09:44  CT Angio Chest/Abd/Pel for Dissection W and/or Wo Contrast  Result Date: 06/09/2021 CLINICAL DATA:  Abdominal pain, known thoracic aortic aneurysm EXAM: CT ANGIOGRAPHY CHEST, ABDOMEN AND PELVIS TECHNIQUE: Non-contrast CT of the chest was initially obtained. Multidetector CT imaging through the chest, abdomen and pelvis was performed using the standard protocol during bolus administration of intravenous contrast. Multiplanar reconstructed images and MIPs were obtained and reviewed to evaluate the vascular anatomy. CONTRAST:  10mL OMNIPAQUE IOHEXOL 350 MG/ML SOLN COMPARISON:  10/21/2019 FINDINGS: CTA CHEST FINDINGS Cardiovascular: Ascending thoracic aorta is again identified  measuring approximately 4.0 cm. No evidence of dissection. Minimal atherosclerosis of the aortic arch. Ectasia of the descending thoracic aorta. The heart is unremarkable without pericardial effusion. There is suboptimal opacification of the pulmonary vasculature to exclude pulmonary emboli. Mediastinum/Nodes: No enlarged mediastinal, hilar, or axillary lymph nodes. Thyroid gland, trachea, and esophagus demonstrate no significant findings. Lungs/Pleura: No acute airspace disease, effusion, or pneumothorax. Stable subpleural scarring within the left lower lobe. Central airways are patent. Musculoskeletal: There are no acute or destructive bony lesions. Reconstructed images demonstrate no additional findings. Review of the MIP images confirms the above findings. CTA ABDOMEN AND PELVIS FINDINGS VASCULAR Aorta: Normal caliber aorta without aneurysm, dissection, vasculitis or significant stenosis. Celiac: Patent without evidence of aneurysm, dissection, vasculitis or significant stenosis. SMA: Patent without evidence of aneurysm, dissection,  vasculitis or significant stenosis. Renals: Both renal arteries are patent without evidence of aneurysm, dissection, vasculitis, fibromuscular dysplasia or significant stenosis. Minimal atherosclerosis at the origin of the left renal artery without significant stenosis. IMA: Patent without evidence of aneurysm, dissection, vasculitis or significant stenosis. Inflow: Patent without evidence of aneurysm, dissection, vasculitis or significant stenosis. Minimal atherosclerosis. Veins: No obvious venous abnormality within the limitations of this arterial phase study. Review of the MIP images confirms the above findings. NON-VASCULAR Hepatobiliary: Mild diffuse hepatic steatosis. No focal liver abnormality. Gallbladder is surgically absent. Pancreas: Unremarkable. No pancreatic ductal dilatation or surrounding inflammatory changes. Spleen: Normal in size without focal abnormality. Adrenals/Urinary Tract: Adrenal glands are unremarkable. Kidneys are normal, without renal calculi, focal lesion, or hydronephrosis. Bladder is unremarkable. Stomach/Bowel: No bowel obstruction or ileus. Normal appendix right lower quadrant. Diffuse colonic diverticulosis without diverticulitis. Lymphatic: No pathologic adenopathy within the abdomen or pelvis. Reproductive: Status post hysterectomy. No adnexal masses. Other: No free fluid or free gas.  No abdominal wall hernia. Musculoskeletal: There are no acute or destructive bony lesions. Reconstructed images demonstrate no additional findings. Review of the MIP images confirms the above findings. IMPRESSION: 1. Stable 4.0 cm ascending thoracic aortic aneurysm. No evidence of dissection. Recommend annual imaging followup by CTA or MRA. This recommendation follows 2010 ACCF/AHA/AATS/ACR/ASA/SCA/SCAI/SIR/STS/SVM Guidelines for the Diagnosis and Management of Patients with Thoracic Aortic Disease. Circulation. 2010; 121: Y774-J287. Aortic aneurysm NOS (ICD10-I71.9) 2. Unremarkable abdominal  aorta. No evidence of aneurysm or dissection. 3. No acute intrathoracic, intra-abdominal, or intrapelvic process. 4. Mild hepatic steatosis. 5. Colonic diverticulosis without diverticulitis. 6.  Aortic Atherosclerosis (ICD10-I70.0). Electronically Signed   By: Sharlet Salina M.D.   On: 06/09/2021 17:00   Disposition   Pt is being discharged home today in good condition.  Follow-up Plans & Appointments     Follow-up Information     Jake Bathe, MD Follow up.   Specialty: Cardiology Why: The office will contact you to arrange follow-up. If you do not hear from them in 2-3 business days, please call the number provided. Contact information: [address] [place] [address] [phone]                Discharge Instructions     Diet - low sodium heart healthy   Complete by: As directed    Discharge instructions   Complete by: As directed    PLEASE REMEMBER TO BRING ALL OF YOUR MEDICATIONS TO EACH OF YOUR FOLLOW-UP OFFICE VISITS.  PLEASE ATTEND ALL SCHEDULED FOLLOW-UP APPOINTMENTS.   Activity: Increase activity slowly as tolerated. You may shower, but no soaking baths (or swimming) for 1 week. No driving for 24 hours. No lifting  over 10 lbs for 1 week. No sexual activity for 1 week.   You May Return to Work: in 1 week (if applicable)  Wound Care: You may wash cath site gently with soap and water. Keep cath site clean and dry. If you notice pain, swelling, bleeding or pus at your cath site, please call [phone].       Discharge Medications   Allergies as of 06/11/2021       Reactions   Adhesive [tape] Rash   pls use elastic wraps instead of adhesive tape   Codeine Nausea And Vomiting   Empagliflozin Other (See Comments)   Headaches & lower back pain        Medication List     STOP taking these medications    lisinopril-hydrochlorothiazide 10-12.5 MG tablet Commonly known as: ZESTORETIC   spironolactone 25 MG tablet Commonly  known as: ALDACTONE       TAKE these medications    acetaminophen 500 MG tablet Commonly known as: TYLENOL Take 1 tablet (500 mg total) by mouth every 6 (six) hours as needed.   albuterol 108 (90 Base) MCG/ACT inhaler Commonly known as: VENTOLIN HFA Inhale 1-2 puffs into the lungs every 6 (six) hours as needed for wheezing.   aspirin EC 81 MG tablet Take 81 mg by mouth daily.   atorvastatin 10 MG tablet Commonly known as: LIPITOR Take 10 mg by mouth daily.   dapagliflozin propanediol 5 MG Tabs tablet Commonly known as: FARXIGA Take 5 mg by mouth daily.   linagliptin 5 MG Tabs tablet Commonly known as: TRADJENTA Take 5 mg by mouth daily.   metoprolol succinate 50 MG 24 hr tablet Commonly known as: TOPROL-XL TAKE 1 TABLET BY MOUTH DAILY WITH OR IMMEDIATELY FOLLOWING A MEAL What changed: See the new instructions.   omeprazole 40 MG capsule Commonly known as: PRILOSEC Take 40 mg by mouth daily.   PreserVision AREDS 2 Caps Take 1 tablet by mouth 2 (two) times daily.   SYSTANE OP Apply 1 drop to eye 3 (three) times daily.   Vitamin D3 25 MCG (1000 UT) Caps Take 1 capsule by mouth daily.           Outstanding Labs/Studies   None  Duration of Discharge Encounter   Greater than 30 minutes including physician time.  Signed, Ellsworth Lennox, PA-C 06/11/2021, 10:15 AM

## 2021-06-10 NOTE — Progress Notes (Signed)
*  PRELIMINARY RESULTS* Echocardiogram 2D Echocardiogram has been performed.  Neomia Dear RDCS 06/10/2021, 11:44 AM

## 2021-06-10 NOTE — Progress Notes (Addendum)
Progress Note  Patient Name: [name] [name] Date of Encounter: 06/10/2021  Primary Cardiologist: Dr. Anne Fu, MD   Subjective   Feeling good today. No recurrent chest pain. Anticipating cath today   Inpatient Medications    Scheduled Meds:  artificial tears   Both Eyes Q8H   [START ON 06/11/2021] aspirin EC  81 mg Oral Daily   atorvastatin  10 mg Oral Daily   cholecalciferol  1,000 Units Oral Daily   insulin aspart  0-15 Units Subcutaneous TID WC   insulin aspart  0-5 Units Subcutaneous QHS   metoprolol succinate  50 mg Oral Daily   multivitamin  1 tablet Oral BID   pantoprazole  40 mg Oral Daily   sodium chloride flush  3 mL Intravenous Q12H   Continuous Infusions:  sodium chloride 50 mL/hr at 06/09/21 2205   sodium chloride     sodium chloride 30 mL/hr at 06/10/21 0635   heparin 850 Units/hr (06/09/21 2313)   PRN Meds: sodium chloride, acetaminophen, albuterol, ALPRAZolam, nitroGLYCERIN, ondansetron (ZOFRAN) IV, sodium chloride flush, zolpidem   Vital Signs    Vitals:   06/10/21 0045 06/10/21 0130 06/10/21 0230 06/10/21 0315  BP: 103/68 114/77 113/76 130/71  Pulse: 87 78 67 60  Resp: 16 15 15 18   Temp:    98.1 F (36.7 C)  TempSrc:    Oral  SpO2: 96% 96% 96% 95%  Weight:    83.1 kg  Height:    5\' 3"  (1.6 m)    Intake/Output Summary (Last 24 hours) at 06/10/2021 0710 Last data filed at 06/10/2021 0606 Gross per 24 hour  Intake 377.55 ml  Output 400 ml  Net -22.45 ml   Filed Weights   06/09/21 0842 06/10/21 0315  Weight: 80.7 kg 83.1 kg    Physical Exam   General: Well developed, well nourished, NAD Neck: Negative for carotid bruits. No JVD Lungs:Clear to ausculation bilaterally. Breathing is unlabored. Cardiovascular: RRR with S1 S2. No murmurs Abdomen: Soft, non-tender, non-distended. No obvious abdominal masses. Extremities: No edema. Radial pulses 2+ bilaterally Neuro: Alert and oriented. No focal deficits. No facial asymmetry. MAE  spontaneously. Psych: Responds to questions appropriately with normal affect.    Labs    Chemistry Recent Labs  Lab 06/09/21 0848 06/10/21 0422  NA 138 138  K 3.8 3.4*  CL 107 110  CO2 21* 22  GLUCOSE 127* 144*  BUN 23 20  CREATININE 1.40* 1.08*  CALCIUM 9.6 9.3  PROT  --  6.0*  ALBUMIN  --  3.1*  AST  --  12*  ALT  --  9  ALKPHOS  --  82  BILITOT  --  0.8  GFRNONAA 39* 53*  ANIONGAP 10 6     Hematology Recent Labs  Lab 06/09/21 0848 06/10/21 0422  WBC 6.4 6.8  RBC 4.45 4.29  HGB 13.3 13.0  HCT 41.2 39.7  MCV 92.6 92.5  MCH 29.9 30.3  MCHC 32.3 32.7  RDW 14.0 14.4  PLT 262 222    Cardiac EnzymesNo results for input(s): TROPONINI in the last 168 hours. No results for input(s): TROPIPOC in the last 168 hours.   BNPNo results for input(s): BNP, PROBNP in the last 168 hours.   DDimer No results for input(s): DDIMER in the last 168 hours.   Radiology    DG Chest 2 View  Result Date: 06/09/2021 CLINICAL DATA:  Chest pain. EXAM: CHEST - 2 VIEW COMPARISON:  10/21/2019 FINDINGS: Both lungs are  clear. Heart size is stable and within normal limits. Again noted is a tortuous distal descending thoracic aorta. The trachea is midline. Possible post traumatic changes or degenerative changes in the lateral left clavicle. IMPRESSION: No active cardiopulmonary disease. Electronically Signed   By: Richarda Overlie M.D.   On: 06/09/2021 09:44   CT Angio Chest/Abd/Pel for Dissection W and/or Wo Contrast  Result Date: 06/09/2021 CLINICAL DATA:  Abdominal pain, known thoracic aortic aneurysm EXAM: CT ANGIOGRAPHY CHEST, ABDOMEN AND PELVIS TECHNIQUE: Non-contrast CT of the chest was initially obtained. Multidetector CT imaging through the chest, abdomen and pelvis was performed using the standard protocol during bolus administration of intravenous contrast. Multiplanar reconstructed images and MIPs were obtained and reviewed to evaluate the vascular anatomy. CONTRAST:  19mL OMNIPAQUE  IOHEXOL 350 MG/ML SOLN COMPARISON:  10/21/2019 FINDINGS: CTA CHEST FINDINGS Cardiovascular: Ascending thoracic aorta is again identified measuring approximately 4.0 cm. No evidence of dissection. Minimal atherosclerosis of the aortic arch. Ectasia of the descending thoracic aorta. The heart is unremarkable without pericardial effusion. There is suboptimal opacification of the pulmonary vasculature to exclude pulmonary emboli. Mediastinum/Nodes: No enlarged mediastinal, hilar, or axillary lymph nodes. Thyroid gland, trachea, and esophagus demonstrate no significant findings. Lungs/Pleura: No acute airspace disease, effusion, or pneumothorax. Stable subpleural scarring within the left lower lobe. Central airways are patent. Musculoskeletal: There are no acute or destructive bony lesions. Reconstructed images demonstrate no additional findings. Review of the MIP images confirms the above findings. CTA ABDOMEN AND PELVIS FINDINGS VASCULAR Aorta: Normal caliber aorta without aneurysm, dissection, vasculitis or significant stenosis. Celiac: Patent without evidence of aneurysm, dissection, vasculitis or significant stenosis. SMA: Patent without evidence of aneurysm, dissection, vasculitis or significant stenosis. Renals: Both renal arteries are patent without evidence of aneurysm, dissection, vasculitis, fibromuscular dysplasia or significant stenosis. Minimal atherosclerosis at the origin of the left renal artery without significant stenosis. IMA: Patent without evidence of aneurysm, dissection, vasculitis or significant stenosis. Inflow: Patent without evidence of aneurysm, dissection, vasculitis or significant stenosis. Minimal atherosclerosis. Veins: No obvious venous abnormality within the limitations of this arterial phase study. Review of the MIP images confirms the above findings. NON-VASCULAR Hepatobiliary: Mild diffuse hepatic steatosis. No focal liver abnormality. Gallbladder is surgically absent. Pancreas:  Unremarkable. No pancreatic ductal dilatation or surrounding inflammatory changes. Spleen: Normal in size without focal abnormality. Adrenals/Urinary Tract: Adrenal glands are unremarkable. Kidneys are normal, without renal calculi, focal lesion, or hydronephrosis. Bladder is unremarkable. Stomach/Bowel: No bowel obstruction or ileus. Normal appendix right lower quadrant. Diffuse colonic diverticulosis without diverticulitis. Lymphatic: No pathologic adenopathy within the abdomen or pelvis. Reproductive: Status post hysterectomy. No adnexal masses. Other: No free fluid or free gas.  No abdominal wall hernia. Musculoskeletal: There are no acute or destructive bony lesions. Reconstructed images demonstrate no additional findings. Review of the MIP images confirms the above findings. IMPRESSION: 1. Stable 4.0 cm ascending thoracic aortic aneurysm. No evidence of dissection. Recommend annual imaging followup by CTA or MRA. This recommendation follows 2010 ACCF/AHA/AATS/ACR/ASA/SCA/SCAI/SIR/STS/SVM Guidelines for the Diagnosis and Management of Patients with Thoracic Aortic Disease. Circulation. 2010; 121: D741-O878. Aortic aneurysm NOS (ICD10-I71.9) 2. Unremarkable abdominal aorta. No evidence of aneurysm or dissection. 3. No acute intrathoracic, intra-abdominal, or intrapelvic process. 4. Mild hepatic steatosis. 5. Colonic diverticulosis without diverticulitis. 6.  Aortic Atherosclerosis (ICD10-I70.0). Electronically Signed   By: Sharlet Salina M.D.   On: 06/09/2021 17:00    Telemetry    06/10/21 NSR HR 60-70s - Personally Reviewed  ECG    No new tracing  as of 06/10/21 - Personally Reviewed  Cardiac Studies   Echocardiogram 06/09/21: Pending    Patient Profile     74 y.o. female with a history of dilated ascending aorta, prior presentations for chest pain, stroke with residual left-sided weakness, hyperlipidemia, diabetes mellitus type 2 who is being seen 06/09/2021 for the evaluation of chest pain at the  request of Dr. Grace Bushy.   Assessment & Plan    1. Chest pain: -Prior images reviewed per Dr. Jacques Navy with concern for apical perfusion defect in the left ventricle, seen serially across the studies. Due to symptoms>>patient was planned for OP ischemic workup however developed more symptoms and presented to [facility]  -LHC from 2015 with no coronary artery disease -Cr, 1.08 today  -Plan is for repeat cardiac cath today to assess for possible progression given risk factors and family hx.   2. HLD: -Last LDL, 63 this admission  -Continue home statin   3. DM2: -Last HbA1c, 7.3 this admission  -Continue Farxiga, Trajenta -SSI while inpatient   4. Hypokalemia: -Will replace with now and follow with labs  -K+, 3.4>>replaced    Signed, Georgie Chard NP-C [facility] Pager: [phone] 06/10/2021, 7:10 AM     For questions or updates, please contact   Please consult [email or web address] for contact info under Cardiology/STEMI.  Patient seen and examined with Georgie Chard, NP-C.  Agree as above, with the following exceptions and changes as noted below.  Patient feels well today and has no interval chest pain since admission.  On IV heparin for unstable angina.  Gen: NAD, CV: RRR, no murmurs, Lungs: clear, Abd: soft, Extrem: Warm, well perfused, no edema, Neuro/Psych: alert and oriented x 3, normal mood and affect. All available labs, radiology testing, previous records reviewed.  Consent as noted below, plan for coronary angiography today.  Her story is a bit unclear at this time given normal coronary arteries in 2015 but an apical perfusion defect on serial CT angios of the chest performed for dissection protocol.  This may now represent fatty metaplasia and prior infarct, but with repeated presentations of chest pain, a definitive answer is necessary.  High probability of obstructive CAD based on symptoms and risk factors.  INFORMED CONSENT: I have reviewed the risks, indications, and  alternatives to cardiac catheterization, possible angioplasty, and stenting with the patient. Risks include but are not limited to bleeding, infection, vascular injury, stroke, myocardial infarction, arrhythmia, kidney injury, radiation-related injury in the case of prolonged fluoroscopy use, emergency cardiac surgery, and death. The patient understands the risks of serious complication is 1-2 in 1000 with diagnostic cardiac cath and 1-2% or less with angioplasty/stenting.    Parke Poisson, MD 06/10/21 9:05 AM

## 2021-06-10 NOTE — H&P (View-Only) (Signed)
Progress Note  Patient Name: [name] [name] Date of Encounter: 06/10/2021  Primary Cardiologist: Dr. Anne Fu, MD   Subjective   Feeling good today. No recurrent chest pain. Anticipating cath today   Inpatient Medications    Scheduled Meds:  artificial tears   Both Eyes Q8H   [START ON 06/11/2021] aspirin EC  81 mg Oral Daily   atorvastatin  10 mg Oral Daily   cholecalciferol  1,000 Units Oral Daily   insulin aspart  0-15 Units Subcutaneous TID WC   insulin aspart  0-5 Units Subcutaneous QHS   metoprolol succinate  50 mg Oral Daily   multivitamin  1 tablet Oral BID   pantoprazole  40 mg Oral Daily   sodium chloride flush  3 mL Intravenous Q12H   Continuous Infusions:  sodium chloride 50 mL/hr at 06/09/21 2205   sodium chloride     sodium chloride 30 mL/hr at 06/10/21 0635   heparin 850 Units/hr (06/09/21 2313)   PRN Meds: sodium chloride, acetaminophen, albuterol, ALPRAZolam, nitroGLYCERIN, ondansetron (ZOFRAN) IV, sodium chloride flush, zolpidem   Vital Signs    Vitals:   06/10/21 0045 06/10/21 0130 06/10/21 0230 06/10/21 0315  BP: 103/68 114/77 113/76 130/71  Pulse: 87 78 67 60  Resp: 16 15 15 18   Temp:    98.1 F (36.7 C)  TempSrc:    Oral  SpO2: 96% 96% 96% 95%  Weight:    83.1 kg  Height:    5\' 3"  (1.6 m)    Intake/Output Summary (Last 24 hours) at 06/10/2021 0710 Last data filed at 06/10/2021 0606 Gross per 24 hour  Intake 377.55 ml  Output 400 ml  Net -22.45 ml   Filed Weights   06/09/21 0842 06/10/21 0315  Weight: 80.7 kg 83.1 kg    Physical Exam   General: Well developed, well nourished, NAD Neck: Negative for carotid bruits. No JVD Lungs:Clear to ausculation bilaterally. Breathing is unlabored. Cardiovascular: RRR with S1 S2. No murmurs Abdomen: Soft, non-tender, non-distended. No obvious abdominal masses. Extremities: No edema. Radial pulses 2+ bilaterally Neuro: Alert and oriented. No focal deficits. No facial asymmetry. MAE  spontaneously. Psych: Responds to questions appropriately with normal affect.    Labs    Chemistry Recent Labs  Lab 06/09/21 0848 06/10/21 0422  NA 138 138  K 3.8 3.4*  CL 107 110  CO2 21* 22  GLUCOSE 127* 144*  BUN 23 20  CREATININE 1.40* 1.08*  CALCIUM 9.6 9.3  PROT  --  6.0*  ALBUMIN  --  3.1*  AST  --  12*  ALT  --  9  ALKPHOS  --  82  BILITOT  --  0.8  GFRNONAA 39* 53*  ANIONGAP 10 6     Hematology Recent Labs  Lab 06/09/21 0848 06/10/21 0422  WBC 6.4 6.8  RBC 4.45 4.29  HGB 13.3 13.0  HCT 41.2 39.7  MCV 92.6 92.5  MCH 29.9 30.3  MCHC 32.3 32.7  RDW 14.0 14.4  PLT 262 222    Cardiac EnzymesNo results for input(s): TROPONINI in the last 168 hours. No results for input(s): TROPIPOC in the last 168 hours.   BNPNo results for input(s): BNP, PROBNP in the last 168 hours.   DDimer No results for input(s): DDIMER in the last 168 hours.   Radiology    DG Chest 2 View  Result Date: 06/09/2021 CLINICAL DATA:  Chest pain. EXAM: CHEST - 2 VIEW COMPARISON:  10/21/2019 FINDINGS: Both lungs are  clear. Heart size is stable and within normal limits. Again noted is a tortuous distal descending thoracic aorta. The trachea is midline. Possible post traumatic changes or degenerative changes in the lateral left clavicle. IMPRESSION: No active cardiopulmonary disease. Electronically Signed   By: Richarda Overlie M.D.   On: 06/09/2021 09:44   CT Angio Chest/Abd/Pel for Dissection W and/or Wo Contrast  Result Date: 06/09/2021 CLINICAL DATA:  Abdominal pain, known thoracic aortic aneurysm EXAM: CT ANGIOGRAPHY CHEST, ABDOMEN AND PELVIS TECHNIQUE: Non-contrast CT of the chest was initially obtained. Multidetector CT imaging through the chest, abdomen and pelvis was performed using the standard protocol during bolus administration of intravenous contrast. Multiplanar reconstructed images and MIPs were obtained and reviewed to evaluate the vascular anatomy. CONTRAST:  19mL OMNIPAQUE  IOHEXOL 350 MG/ML SOLN COMPARISON:  10/21/2019 FINDINGS: CTA CHEST FINDINGS Cardiovascular: Ascending thoracic aorta is again identified measuring approximately 4.0 cm. No evidence of dissection. Minimal atherosclerosis of the aortic arch. Ectasia of the descending thoracic aorta. The heart is unremarkable without pericardial effusion. There is suboptimal opacification of the pulmonary vasculature to exclude pulmonary emboli. Mediastinum/Nodes: No enlarged mediastinal, hilar, or axillary lymph nodes. Thyroid gland, trachea, and esophagus demonstrate no significant findings. Lungs/Pleura: No acute airspace disease, effusion, or pneumothorax. Stable subpleural scarring within the left lower lobe. Central airways are patent. Musculoskeletal: There are no acute or destructive bony lesions. Reconstructed images demonstrate no additional findings. Review of the MIP images confirms the above findings. CTA ABDOMEN AND PELVIS FINDINGS VASCULAR Aorta: Normal caliber aorta without aneurysm, dissection, vasculitis or significant stenosis. Celiac: Patent without evidence of aneurysm, dissection, vasculitis or significant stenosis. SMA: Patent without evidence of aneurysm, dissection, vasculitis or significant stenosis. Renals: Both renal arteries are patent without evidence of aneurysm, dissection, vasculitis, fibromuscular dysplasia or significant stenosis. Minimal atherosclerosis at the origin of the left renal artery without significant stenosis. IMA: Patent without evidence of aneurysm, dissection, vasculitis or significant stenosis. Inflow: Patent without evidence of aneurysm, dissection, vasculitis or significant stenosis. Minimal atherosclerosis. Veins: No obvious venous abnormality within the limitations of this arterial phase study. Review of the MIP images confirms the above findings. NON-VASCULAR Hepatobiliary: Mild diffuse hepatic steatosis. No focal liver abnormality. Gallbladder is surgically absent. Pancreas:  Unremarkable. No pancreatic ductal dilatation or surrounding inflammatory changes. Spleen: Normal in size without focal abnormality. Adrenals/Urinary Tract: Adrenal glands are unremarkable. Kidneys are normal, without renal calculi, focal lesion, or hydronephrosis. Bladder is unremarkable. Stomach/Bowel: No bowel obstruction or ileus. Normal appendix right lower quadrant. Diffuse colonic diverticulosis without diverticulitis. Lymphatic: No pathologic adenopathy within the abdomen or pelvis. Reproductive: Status post hysterectomy. No adnexal masses. Other: No free fluid or free gas.  No abdominal wall hernia. Musculoskeletal: There are no acute or destructive bony lesions. Reconstructed images demonstrate no additional findings. Review of the MIP images confirms the above findings. IMPRESSION: 1. Stable 4.0 cm ascending thoracic aortic aneurysm. No evidence of dissection. Recommend annual imaging followup by CTA or MRA. This recommendation follows 2010 ACCF/AHA/AATS/ACR/ASA/SCA/SCAI/SIR/STS/SVM Guidelines for the Diagnosis and Management of Patients with Thoracic Aortic Disease. Circulation. 2010; 121: D741-O878. Aortic aneurysm NOS (ICD10-I71.9) 2. Unremarkable abdominal aorta. No evidence of aneurysm or dissection. 3. No acute intrathoracic, intra-abdominal, or intrapelvic process. 4. Mild hepatic steatosis. 5. Colonic diverticulosis without diverticulitis. 6.  Aortic Atherosclerosis (ICD10-I70.0). Electronically Signed   By: Sharlet Salina M.D.   On: 06/09/2021 17:00    Telemetry    06/10/21 NSR HR 60-70s - Personally Reviewed  ECG    No new tracing  as of 06/10/21 - Personally Reviewed  Cardiac Studies   Echocardiogram 06/09/21: Pending    Patient Profile     74 y.o. female with a history of dilated ascending aorta, prior presentations for chest pain, stroke with residual left-sided weakness, hyperlipidemia, diabetes mellitus type 2 who is being seen 06/09/2021 for the evaluation of chest pain at the  request of Dr. Grace Bushy.   Assessment & Plan    1. Chest pain: -Prior images reviewed per Dr. Jacques Navy with concern for apical perfusion defect in the left ventricle, seen serially across the studies. Due to symptoms>>patient was planned for OP ischemic workup however developed more symptoms and presented to [facility]  -LHC from 2015 with no coronary artery disease -Cr, 1.08 today  -Plan is for repeat cardiac cath today to assess for possible progression given risk factors and family hx.   2. HLD: -Last LDL, 63 this admission  -Continue home statin   3. DM2: -Last HbA1c, 7.3 this admission  -Continue Farxiga, Trajenta -SSI while inpatient   4. Hypokalemia: -Will replace with now and follow with labs  -K+, 3.4>>replaced    Signed, Georgie Chard NP-C [facility] Pager: [phone] 06/10/2021, 7:10 AM     For questions or updates, please contact   Please consult [email or web address] for contact info under Cardiology/STEMI.  Patient seen and examined with Georgie Chard, NP-C.  Agree as above, with the following exceptions and changes as noted below.  Patient feels well today and has no interval chest pain since admission.  On IV heparin for unstable angina.  Gen: NAD, CV: RRR, no murmurs, Lungs: clear, Abd: soft, Extrem: Warm, well perfused, no edema, Neuro/Psych: alert and oriented x 3, normal mood and affect. All available labs, radiology testing, previous records reviewed.  Consent as noted below, plan for coronary angiography today.  Her story is a bit unclear at this time given normal coronary arteries in 2015 but an apical perfusion defect on serial CT angios of the chest performed for dissection protocol.  This may now represent fatty metaplasia and prior infarct, but with repeated presentations of chest pain, a definitive answer is necessary.  High probability of obstructive CAD based on symptoms and risk factors.  INFORMED CONSENT: I have reviewed the risks, indications, and  alternatives to cardiac catheterization, possible angioplasty, and stenting with the patient. Risks include but are not limited to bleeding, infection, vascular injury, stroke, myocardial infarction, arrhythmia, kidney injury, radiation-related injury in the case of prolonged fluoroscopy use, emergency cardiac surgery, and death. The patient understands the risks of serious complication is 1-2 in 1000 with diagnostic cardiac cath and 1-2% or less with angioplasty/stenting.    Parke Poisson, MD 06/10/21 9:05 AM

## 2021-06-11 DIAGNOSIS — R0789 Other chest pain: Principal | ICD-10-CM

## 2021-06-11 LAB — BASIC METABOLIC PANEL
Anion gap: 6 (ref 5–15)
BUN: 17 mg/dL (ref 8–23)
CO2: 21 mmol/L — ABNORMAL LOW (ref 22–32)
Calcium: 9.1 mg/dL (ref 8.9–10.3)
Chloride: 111 mmol/L (ref 98–111)
Creatinine, Ser: 1.15 mg/dL — ABNORMAL HIGH (ref 0.44–1.00)
GFR, Estimated: 49 mL/min — ABNORMAL LOW (ref >60)
Glucose, Bld: 126 mg/dL — ABNORMAL HIGH (ref 70–99)
Potassium: 4.1 mmol/L (ref 3.5–5.1)
Sodium: 138 mmol/L (ref 135–145)

## 2021-06-11 LAB — GLUCOSE, CAPILLARY: Glucose-Capillary: 110 mg/dL — ABNORMAL HIGH (ref 70–99)

## 2021-06-11 NOTE — Discharge Instructions (Signed)
Lisinopril-HCTZ and Spironolactone have been held for now given your lower blood pressure readings. Would recommend you check your blood pressure at home if able and report back to [facility] or your PCP with the readings.

## 2021-06-11 NOTE — Progress Notes (Addendum)
Progress Note  Patient Name: [name] [name] Date of Encounter: 06/11/2021  [facility] Cardiologist: Donato Schultz, MD   Subjective   No recurrent chest pain. No complications regarding radial cath site. No dyspnea, orthopnea or PND.   Inpatient Medications    Scheduled Meds:  artificial tears   Both Eyes Q8H   aspirin EC  81 mg Oral Daily   atorvastatin  10 mg Oral Daily   cholecalciferol  1,000 Units Oral Daily   insulin aspart  0-15 Units Subcutaneous TID WC   insulin aspart  0-5 Units Subcutaneous QHS   metoprolol succinate  50 mg Oral Daily   multivitamin  1 tablet Oral BID   pantoprazole  40 mg Oral Daily   sodium chloride flush  3 mL Intravenous Q12H   Continuous Infusions:  sodium chloride     PRN Meds: sodium chloride, acetaminophen, albuterol, ALPRAZolam, nitroGLYCERIN, ondansetron (ZOFRAN) IV, sodium chloride flush, zolpidem   Vital Signs    Vitals:   06/10/21 1900 06/10/21 2355 06/11/21 0441 06/11/21 0443  BP: 108/70 113/70 114/83   Pulse: (!) 59 60 (!) 55   Resp: 17 16 18    Temp: 97.8 F (36.6 C) 98 F (36.7 C) 98.1 F (36.7 C)   TempSrc: Oral Oral Oral   SpO2: 99% 94% 98%   Weight:    80.6 kg  Height:        Intake/Output Summary (Last 24 hours) at 06/11/2021 0725 Last data filed at 06/10/2021 2200 Gross per 24 hour  Intake 773.39 ml  Output --  Net 773.39 ml   Last 3 Weights 06/11/2021 06/10/2021 06/09/2021  Weight (lbs) 177 lb 12.8 oz 183 lb 4.8 oz 178 lb  Weight (kg) 80.65 kg 83.144 kg 80.74 kg      Telemetry    Sinus bradycardia, HR in 50's to 60's.  - Personally Reviewed  ECG    Sinus bradycardia, HR 53 with no acute ST changes when compared to prior tracings.  - Personally Reviewed  Physical Exam   GEN: Pleasant female appearing in no acute distress.   Neck: No JVD Cardiac: RRR, no murmurs, rubs, or gallops.  Respiratory: Clear to auscultation bilaterally. GI: Soft, nontender, non-distended  MS: No pitting edema; No  deformity. Radial cath site stable without ecchymosis or evidence of a hematoma.  Neuro:  Nonfocal  Psych: Normal affect   Labs    High Sensitivity Troponin:   Recent Labs  Lab 06/09/21 0848 06/09/21 1456 06/09/21 2203 06/10/21 0422  TROPONINIHS 3 7 4 4       Chemistry Recent Labs  Lab 06/09/21 0848 06/10/21 0422  NA 138 138  K 3.8 3.4*  CL 107 110  CO2 21* 22  GLUCOSE 127* 144*  BUN 23 20  CREATININE 1.40* 1.08*  CALCIUM 9.6 9.3  PROT  --  6.0*  ALBUMIN  --  3.1*  AST  --  12*  ALT  --  9  ALKPHOS  --  82  BILITOT  --  0.8  GFRNONAA 39* 53*  ANIONGAP 10 6     Hematology Recent Labs  Lab 06/09/21 0848 06/10/21 0422  WBC 6.4 6.8  RBC 4.45 4.29  HGB 13.3 13.0  HCT 41.2 39.7  MCV 92.6 92.5  MCH 29.9 30.3  MCHC 32.3 32.7  RDW 14.0 14.4  PLT 262 222    BNPNo results for input(s): BNP, PROBNP in the last 168 hours.   DDimer No results for input(s): DDIMER in the last  168 hours.   Radiology    DG Chest 2 View  Result Date: 06/09/2021 CLINICAL DATA:  Chest pain. EXAM: CHEST - 2 VIEW COMPARISON:  10/21/2019 FINDINGS: Both lungs are clear. Heart size is stable and within normal limits. Again noted is a tortuous distal descending thoracic aorta. The trachea is midline. Possible post traumatic changes or degenerative changes in the lateral left clavicle. IMPRESSION: No active cardiopulmonary disease. Electronically Signed   By: Richarda Overlie M.D.   On: 06/09/2021 09:44   Cardiac Studies   Echocardiogram: 06/10/2021 IMPRESSIONS     1. Compared to 02/09/20, findings are similar.   2. Left ventricular ejection fraction, by estimation, is 55 to 60%. The  left ventricle has normal function. The left ventricle has no regional  wall motion abnormalities. Left ventricular diastolic parameters are  consistent with Grade I diastolic  dysfunction (impaired relaxation).   3. Right ventricular systolic function is normal. The right ventricular  size is normal. There is  mildly elevated pulmonary artery systolic  pressure.   4. The mitral valve is normal in structure. Mild mitral valve  regurgitation. No evidence of mitral stenosis.   5. The aortic valve is tricuspid. Aortic valve regurgitation is trivial.  No aortic stenosis is present.   6. Aortic dilatation noted. There is mild dilatation of the ascending  aorta and of the aortic root, measuring 38 mm.   7. The inferior vena cava is normal in size with greater than 50%  respiratory variability, suggesting right atrial pressure of 3 mmHg.   Cardiac Catheterization: 06/10/2021 Widely patent coronary arteries with minimal irregularities in the LAD and LCx, angiographically normal left main and RCA. Normal LVEDP Tortuous right subclavian artery   Suspect non-cardiac chest pain  Patient Profile     75 y.o. female w/PMH of dilated ascending aorta, HTN, HLD, Type 2 DM and prior CVA who is currently admitted for evaluation of chest pain.   Assessment & Plan    1. Exertional Chest Pain - She presented with chest pain occurring with exertion such as climbing steps and improving with rest. Hs Troponin values were negative and EKG did not show any acute ST changes. Prior CT images were concerning for an apical perfusion defect and a cardiac catheterization was recommended for definitive evaluation.  - Cath on 06/10/2021 showed widely patent coronary arteries with minimal irregularities and normal LVEDP. No recurrent pain and no indication for further testing at this time. Continue with risk factor modification. Remains on ASA and statin therapy.   2. HLD - FLP shows total cholesterol of 113, HDL 34 and LDL 63. Continue PTA Atorvastatin 10mg  daily.   3. HTN - She was on Lisinopril-HCTZ 10-12.5mg  daily and Spironolactone 12.5mg  daily as an outpatient but both were held for her cath. Would plan to restart at discharge if renal function remains stable (repeat BMET pending). She has been continued on PTA Toprol-XL  50mg  daily and would not further titrate given HR in the 50's.   4. Type 2 DM - Repeat Hgb A1c at 7.3 this admission. On Farxiga and Tradjenta as an outpatient with plans to resume at discharge. Currently on SSI.   5. Hypokalemia - K+ was at 3.4 on 7/22 with supplementation ordered. Repeat BMET pending.   6. Ascending Thoracic Aortic Dilation - Measured at 4.0 cm this admission with annual imaging recommended.   For questions or updates, please contact [facility] Please consult [email or web address] for contact info under  Signed, Ellsworth Lennox, PA-C  06/11/2021, 7:25 AM    Attending note  Patient seen and discussed with PA [place] Ouch, I agree with her documentation. Admitted with chest pain, cath this admission with widely patnet coronaries. Echo LVEF 55-60%, no WMAs, grade I dd. Home aldactone 12.5mg  and lisinopril/HCTZ held on admission due to AKI. Her bps are in 110s off these meds, would stay off at discharge, consider restarting at f/u pending bp's.    Dina Rich MD

## 2021-06-13 ENCOUNTER — Encounter (HOSPITAL_COMMUNITY): Payer: Self-pay | Admitting: Cardiovascular Disease

## 2021-06-13 ENCOUNTER — Encounter (HOSPITAL_COMMUNITY): Payer: Medicare Other

## 2021-07-29 ENCOUNTER — Ambulatory Visit: Payer: Medicare Other | Admitting: Physician Assistant

## 2021-07-29 ENCOUNTER — Encounter (HOSPITAL_BASED_OUTPATIENT_CLINIC_OR_DEPARTMENT_OTHER): Payer: Self-pay | Admitting: Family

## 2021-07-29 ENCOUNTER — Ambulatory Visit (HOSPITAL_BASED_OUTPATIENT_CLINIC_OR_DEPARTMENT_OTHER): Payer: Medicare Other | Admitting: Family

## 2021-07-29 ENCOUNTER — Other Ambulatory Visit: Payer: Self-pay

## 2021-07-29 VITALS — BP 146/80 | HR 63 | Ht 63.0 in | Wt 185.0 lb

## 2021-07-29 DIAGNOSIS — I10 Essential (primary) hypertension: Secondary | ICD-10-CM | POA: Diagnosis not present

## 2021-07-29 DIAGNOSIS — I712 Thoracic aortic aneurysm, without rupture, unspecified: Secondary | ICD-10-CM

## 2021-07-29 DIAGNOSIS — Z8673 Personal history of transient ischemic attack (TIA), and cerebral infarction without residual deficits: Secondary | ICD-10-CM

## 2021-07-29 DIAGNOSIS — E119 Type 2 diabetes mellitus without complications: Secondary | ICD-10-CM

## 2021-07-29 DIAGNOSIS — E782 Mixed hyperlipidemia: Secondary | ICD-10-CM | POA: Diagnosis not present

## 2021-07-29 MED ORDER — LISINOPRIL 10 MG PO TABS: 10.0000 mg | ORAL_TABLET | Freq: Every day | ORAL | 2 refills | Status: DC

## 2021-07-29 NOTE — Progress Notes (Signed)
Office Visit    Patient Name: [name] [name] Date of Encounter: 07/29/2021  PCP:  Verlon Au, MD    [facility]  Cardiologist:  Donato Schultz, MD  Advanced Practice Provider:  No care team member to display Electrophysiologist:  None      Chief Complaint    [name] [name] is a 75 y.o. female with a hx of dilated ascending aorta, prior CVA with residual left-sided weakness, hyperlipidemia, DM2, chest pain presents today for hospital follow-up  Past Medical History    Past Medical History:  Diagnosis Date   Arthritis    Bronchitis    hx   Chest pain 2009   MV with no scar or ischemia, EF 65%   Complication of anesthesia    claustrophobic !   Diabetes mellitus    GERD (gastroesophageal reflux disease)    History of cardiac catheterization    a. Myoview 2/15 with anteroapical ischemia, EF 64% (intermediate risk) >> LHC - normal cos, EF 55-60%   Hypertension    Neuromuscular disorder (HCC)    arotic Aneurysm   Obesity (BMI 35.0-39.9 without comorbidity)    Pneumonia    Stroke [facility])    with some resid L sided weakness   Thoracic ascending aortic aneurysm (HCC)    a. CT 1/17: ascending aorta 4.2 x 4.0 cm   Past Surgical History:  Procedure Laterality Date   ABDOMINAL HYSTERECTOMY  2006   Partial, one ovary left    CARDIAC CATHETERIZATION  2004   [place], [place] per pt.   CHOLECYSTECTOMY     EAR CYST EXCISION Left 09/20/2016   Procedure: EXCISION  LEFT EAR LESION;  Surgeon: Newman Pies, MD;  Location: [facility] OR;  Service: ENT;  Laterality: Left;   LEFT HEART CATH AND CORONARY ANGIOGRAPHY N/A 06/10/2021   Procedure: LEFT HEART CATH AND CORONARY ANGIOGRAPHY;  Surgeon: Tonny Bollman, MD;  Location: [facility] INVASIVE CV LAB;  Service: Cardiovascular;  Laterality: N/A;   LEFT HEART CATHETERIZATION WITH CORONARY ANGIOGRAM N/A 01/09/2014   Procedure: LEFT HEART CATHETERIZATION WITH CORONARY ANGIOGRAM;  Surgeon: Marykay Lex, MD;  Location: [facility] CATH  LAB;  Service: Cardiovascular;  Laterality: N/A;   MENISCUS REPAIR Right    ROTATOR CUFF REPAIR Left    SKIN SPLIT GRAFT Left 09/20/2016   Procedure: SKIN GRAFT SPLIT THICKNESS;  Surgeon: Newman Pies, MD;  Location: [facility] OR;  Service: ENT;  Laterality: Left;   TEMPOROMANDIBULAR JOINT SURGERY     TONSILLECTOMY     WISDOM TOOTH EXTRACTION      Allergies  Allergies  Allergen Reactions   Adhesive [Tape] Rash    pls use elastic wraps instead of adhesive tape   Codeine Nausea And Vomiting   Empagliflozin Other (See Comments)    Headaches & lower back pain    History of Present Illness    [name] [name] is a 75 y.o. female with a hx of dilated ascending aorta, prior CVA with residual left-sided weakness, hyperlipidemia, DM2, chest pain  last seen while hospitalized.  Previous nuclear stress test and cardiac catheterization in 2015 with no noted coronary artery disease and normal LVEF.   She was initially evaluated in clinic in 2019 for chest pain and shortness of breath.  No PE noted on CT and no significant coronary calcifications noted on CT angiogram of chest from 2020.  CT at that time with stable ascending aortic aneurysm 4.1 cm.   She was evaluated in the emergency department December  2020 for dizziness and diagnosed with vertigo. Echo 02/09/20 stable 50mm aortic root. When last seen October 2021 she noted some musculoskeletal type tightness when she is feeling distressed but no significant shortness of breath nor palpitations.  She was seen 05/17/2021 with chest pain and recommended for [facility].  However subsequently admitted June 25, 2021 with chest pain.  Underwent LHC with widely patent coronary arteries and only minimal irregularities in the LAD and LCx.  Echocardiogram during admission with LVEF 55 to 60%, no RW MA, grade 1 diastolic dysfunction, mildly elevated PASP, mild MR.  Presents today for follow-up with her daughter and grandson.  Reports no recurrent chest pain, Shikhman  tightness.  Notes dyspnea on exertion which is stable at her baseline and she attributes to inactivity.  This occurs most often with heavy lifting.  Note some sensation of dizziness with sensation of room spinning she lays down we discussed likely diagnosis for vertigo.  Tells me she has been diagnosed with this before and previously provided medication which assisted from her primary care provider.   EKGs/Labs/Other Studies Reviewed:   The following studies were reviewed today: LHC 2021/06/25:   Widely patent coronary arteries with minimal irregularities in the LAD and LCx, angiographically normal left main and RCA. Normal LVEDP Tortuous right subclavian artery   Suspect non-cardiac chest pain     Diagnostic Dominance: Right         Echocardiogram 06/25/21:  1. Compared to 02/09/20, findings are similar.   2. Left ventricular ejection fraction, by estimation, is 55 to 60%. The  left ventricle has normal function. The left ventricle has no regional  wall motion abnormalities. Left ventricular diastolic parameters are  consistent with Grade I diastolic  dysfunction (impaired relaxation).   3. Right ventricular systolic function is normal. The right ventricular  size is normal. There is mildly elevated pulmonary artery systolic  pressure.   4. The mitral valve is normal in structure. Mild mitral valve  regurgitation. No evidence of mitral stenosis.   5. The aortic valve is tricuspid. Aortic valve regurgitation is trivial.  No aortic stenosis is present.   6. Aortic dilatation noted. There is mild dilatation of the ascending  aorta and of the aortic root, measuring 38 mm.   7. The inferior vena cava is normal in size with greater than 50%  respiratory variability, suggesting right atrial pressure of 3 mmHg.     EKG: No EKG today  Recent Labs: 06/09/2021: TSH 0.670 2021/06/25: ALT 9; Hemoglobin 13.0; Platelets 222 06/11/2021: BUN 17; Creatinine, Ser 1.15; Potassium 4.1; Sodium 138   Recent Lipid Panel    Component Value Date/Time   CHOL 113 Jun 25, 2021 0422   TRIG 78 Jun 25, 2021 0422   HDL 34 (L) Jun 25, 2021 0422   CHOLHDL 3.3 06/25/21 0422   VLDL 16 06/25/2021 0422   LDLCALC 63 June 25, 2021 0422     Home Medications   Current Meds  Medication Sig   acetaminophen (TYLENOL) 500 MG tablet Take 1 tablet (500 mg total) by mouth every 6 (six) hours as needed.   albuterol (PROVENTIL HFA;VENTOLIN HFA) 108 (90 BASE) MCG/ACT inhaler Inhale 1-2 puffs into the lungs every 6 (six) hours as needed for wheezing.   aspirin EC 81 MG tablet Take 81 mg by mouth daily.   atorvastatin (LIPITOR) 10 MG tablet Take 10 mg by mouth daily.   Cholecalciferol (VITAMIN D3) 1000 units CAPS Take 1 capsule by mouth daily.   dapagliflozin propanediol (FARXIGA) 5 MG TABS tablet Take 5 mg by  mouth daily.   linagliptin (TRADJENTA) 5 MG TABS tablet Take 5 mg by mouth daily.   metoprolol succinate (TOPROL-XL) 50 MG 24 hr tablet TAKE 1 TABLET BY MOUTH DAILY WITH OR IMMEDIATELY FOLLOWING A MEAL   Multiple Vitamins-Minerals (PRESERVISION AREDS 2) CAPS Take 1 tablet by mouth 2 (two) times daily.   omeprazole (PRILOSEC) 40 MG capsule Take 40 mg by mouth daily.   Polyethyl Glycol-Propyl Glycol (SYSTANE OP) Apply 1 drop to eye 3 (three) times daily.    Review of Systems      All other systems reviewed and are otherwise negative except as noted above.  Physical Exam    VS:  BP (!) 146/80   Pulse 63   Ht 5\' 3"  (1.6 m)   Wt 185 lb (83.9 kg)   SpO2 97%   BMI 32.77 kg/m  , BMI Body mass index is 32.77 kg/m.  Wt Readings from Last 3 Encounters:  07/29/21 185 lb (83.9 kg)  06/11/21 177 lb 12.8 oz (80.6 kg)  05/17/21 175 lb (79.4 kg)    GEN: Well nourished, well developed, in no acute distress. HEENT: normal. Neck: Supple, no JVD, carotid bruits, or masses. Cardiac: RRR, no murmurs, rubs, or gallops. No clubbing, cyanosis, edema.  Radials/PT 2+ and equal bilaterally.  Respiratory:  Respirations  regular and unlabored, clear to auscultation bilaterally. GI: Soft, nontender, nondistended. MS: No deformity or atrophy. Skin: Warm and dry, no rash.  Right radial cath site clean, dry, intact with no hematoma no ecchymosis. Neuro:  Strength and sensation are intact. Psych: Normal affect.  Assessment & Plan    Ascending thoracic aorta dilation -  2017 4.1cm, 2019 49mm, echo 01/2020 42mm. 05/2021 mild dilation of ascending aortic root 37mm. Consider repeat echo in 1-2 years for monitoring. Continue optimal BP control, statin.  Chest pain -no recurrence.  Recent cardiac catheterization with normal coronary arteries.  Chest pain noncardiac. Recommend gradual introduction of walking regimen to increase exercise tolerance. She was reassured by results of cardiac catheterization and echo.   DM2 -continue to follow with primary care provider.  HTN-BP mildly elevated controlled. Antihypertensive agents adjusted during recent admission. Previously on Lisinopril-HCTZ. We will add Lisinopril 10mg  QD and repeat BMP in 2 weeks. If BP not at goal of <130/80, consider transition to Lisinopril-HCTZ as she tolerated well previously.  HLD -continue atorvastatin 10 mg daily.  Denies myalgias.  History of CVA with residual left-sided weakness -continue aspirin, statin, optimal blood pressure and A1c control.   Disposition: Follow up in 4 month(s) with Dr. 20m or APP.  Signed, , NP 07/29/2021, 3:04 PM [facility]

## 2021-07-29 NOTE — Patient Instructions (Signed)
Medication Instructions:  Your physician has recommended you make the following change in your medication:    START Lisinporil one 10mg  tablet daily  *If you need a refill on your cardiac medications before your next appointment, please call your pharmacy*   Lab Work: Your physician recommends that you return for lab work in 2 weeks for BMP  Testing/Procedures: None ordered today   Follow-Up: At , you and your health needs are our priority.  As part of our continuing mission to provide you with exceptional heart care, we have created designated Provider Care Teams.  These Care Teams include your primary Cardiologist (physician) and Advanced Practice Providers (APPs -  Physician Assistants and Nurse Practitioners) who all work together to provide you with the care you need, when you need it.  We recommend signing up for the patient portal called "MyChart".  Sign up information is provided on this After Visit Summary.  MyChart is used to connect with patients for Virtual Visits (Telemedicine).  Patients are able to view lab/test results, encounter notes, upcoming appointments, etc.  Non-urgent messages can be sent to your provider as well.   To learn more about what you can do with MyChart, go to [employer].    Your next appointment:   4 month(s)  The format for your next appointment:   In Person  Provider:   You may see [email or web address], MD or one of the following Advanced Practice Providers on your designated Care Team:   Donato Schultz, NP Nada Boozer, NP    Other Instructions  Heart Healthy Diet Recommendations: A low-salt diet is recommended. Meats should be grilled, baked, or boiled. Avoid fried foods. Focus on lean protein sources like fish or chicken with vegetables and fruits. The American Heart Association is a Alver Sorrow!    Exercise recommendations: The American Heart Association recommends 150 minutes of moderate intensity exercise  weekly. Try 30 minutes of moderate intensity exercise 4-5 times per week. This could include walking, jogging, or swimming.

## 2021-08-15 ENCOUNTER — Other Ambulatory Visit: Payer: Medicare Other | Admitting: *Deleted

## 2021-08-15 ENCOUNTER — Other Ambulatory Visit: Payer: Self-pay | Admitting: Family

## 2021-08-15 ENCOUNTER — Other Ambulatory Visit: Payer: Self-pay

## 2021-08-17 LAB — BASIC METABOLIC PANEL
BUN/Creatinine Ratio: 18 (ref 12–28)
BUN: 19 mg/dL (ref 8–27)
CO2: 26 mmol/L (ref 20–29)
Calcium: 9.4 mg/dL (ref 8.7–10.3)
Chloride: 102 mmol/L (ref 96–106)
Creatinine, Ser: 1.04 mg/dL — ABNORMAL HIGH (ref 0.57–1.00)
Glucose: 113 mg/dL — ABNORMAL HIGH (ref 65–99)
Potassium: 3.7 mmol/L (ref 3.5–5.2)
Sodium: 142 mmol/L (ref 134–144)
eGFR: 56 mL/min/{1.73_m2} — ABNORMAL LOW (ref >59)

## 2021-09-13 ENCOUNTER — Ambulatory Visit: Payer: Medicare Other | Admitting: Cardiology

## 2021-12-04 ENCOUNTER — Ambulatory Visit
Admission: EM | Admit: 2021-12-04 | Discharge: 2021-12-04 | Disposition: A | Discharge status: Home / Self Care | Payer: Medicare Other | Attending: Internal Medicine | Admitting: Internal Medicine

## 2021-12-04 ENCOUNTER — Other Ambulatory Visit: Payer: Self-pay

## 2021-12-04 ENCOUNTER — Ambulatory Visit (INDEPENDENT_AMBULATORY_CARE_PROVIDER_SITE_OTHER): Payer: Medicare Other

## 2021-12-04 ENCOUNTER — Encounter: Payer: Self-pay | Admitting: Emergency Medicine

## 2021-12-04 DIAGNOSIS — M79642 Pain in left hand: Secondary | ICD-10-CM

## 2021-12-04 NOTE — ED Provider Notes (Signed)
[facility]    CSN: [id number] Arrival date & time: 12/04/21  1018      History   Chief Complaint Chief Complaint  Patient presents with   Hand Injury    HPI [name] [name] is a 76 y.o. female.   Patient presents with left hand pain that started 2 weeks ago after an injury.  Patient reports that she was using a hammer when it slipped and she accidentally hit her hand with a hammer.  The bruising and swelling have not decreased in that area, so she is concerned for fracture.  Denies any numbness or tingling.  Patient does not take any blood thinners.  Although, she does take aspirin 81 mg.   Hand Injury  Past Medical History:  Diagnosis Date   Arthritis    Bronchitis    hx   Chest pain 2009   MV with no scar or ischemia, EF [id number]   Complication of anesthesia    claustrophobic !   Diabetes mellitus    GERD (gastroesophageal reflux disease)    History of cardiac catheterization    a. Myoview 2/15 with anteroapical ischemia, EF 64% (intermediate risk) >> LHC - normal cos, EF 55-60%   Hypertension    Neuromuscular disorder (HCC)    arotic Aneurysm   Obesity (BMI 35.0-39.9 without comorbidity)    Pneumonia    Stroke [facility])    with some resid L sided weakness   Thoracic ascending aortic aneurysm    a. 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, midline 07/15/2020   Encounter for fitting and adjustment of pessary 07/15/2020   Chronic sinusitis    Complicated migraine    Pure hypercholesterolemia    TIA (transient ischemic attack)    Chest pain    Cerebral infarction ([place]) 08/26/2016   Chronic diastolic heart failure, NYHA class 2 ([place]) 12/07/2015   Pulmonary HTN ([place]) 12/07/2015   Chronic chest pain 12/07/2015   Thoracic aortic aneurysm 12/07/2015   Left sided  numbness 12/07/2015   Bradycardia 05/06/2012   CVA (cerebral vascular accident) ([place]) 05/05/2012   Obesity due to excess calories 04/25/2012   Cystocele 03/22/2012   Type 2 diabetes mellitus without complication, without long-term current use of insulin ([place]) 03/22/2012   Essential hypertension 03/22/2012   gerd 03/22/2012    Past Surgical History:  Procedure Laterality Date   ABDOMINAL HYSTERECTOMY  2006   Partial, one ovary left    CARDIAC CATHETERIZATION  2004   [place], [place].; [place] per pt.   CHOLECYSTECTOMY     EAR CYST EXCISION Left 09/20/2016   Procedure: EXCISION  LEFT EAR LESION;  Surgeon: Leta Baptist, MD;  Location: [place] OR;  Service: ENT;  Laterality: Left;   LEFT HEART CATH AND CORONARY ANGIOGRAPHY N/A 06/10/2021   Procedure: LEFT HEART CATH AND CORONARY ANGIOGRAPHY;  Surgeon: Sherren Mocha, MD;  Location: [place] CV LAB;  Service: Cardiovascular;  Laterality: N/A;   LEFT HEART CATHETERIZATION WITH CORONARY ANGIOGRAM N/A 01/09/2014   Procedure: LEFT HEART CATHETERIZATION WITH CORONARY ANGIOGRAM;  Surgeon: Leonie Man, MD;  Location: [facility] CATH LAB;  Service: Cardiovascular;  Laterality: N/A;   MENISCUS REPAIR Right    ROTATOR CUFF REPAIR Left    SKIN SPLIT GRAFT Left 09/20/2016   Procedure: SKIN GRAFT SPLIT  THICKNESS;  Surgeon: Leta Baptist, MD;  Location: [facility] OR;  Service: ENT;  Laterality: Left;   TEMPOROMANDIBULAR JOINT SURGERY     TONSILLECTOMY     WISDOM TOOTH EXTRACTION      OB History     Gravida  4   Para  4   Term  4   Preterm  0   AB  0   Living  4      SAB  0   IAB  0   Ectopic  0   Multiple  0   Live Births           Obstetric Comments  Delivered 4 large babies vaginally          Home Medications    Prior to Admission medications   Medication Sig Start Date End Date Taking? Authorizing Provider  acetaminophen (TYLENOL) 500 MG tablet Take 1 tablet (500 mg total) by mouth every 6 (six) hours as needed. 10/21/19   Fawze, Mina A, PA-C   albuterol (PROVENTIL HFA;VENTOLIN HFA) 108 (90 BASE) MCG/ACT inhaler Inhale 1-2 puffs into the lungs every 6 (six) hours as needed for wheezing. 07/29/13   Harden Mo, MD  aspirin EC 81 MG tablet Take 81 mg by mouth daily.    [provider]  atorvastatin (LIPITOR) 10 MG tablet Take 10 mg by mouth daily. 08/05/20   [provider]  Cholecalciferol (VITAMIN D3) 1000 units CAPS Take 1 capsule by mouth daily.    [provider]  dapagliflozin propanediol (FARXIGA) 5 MG TABS tablet Take 5 mg by mouth daily.    [provider]  linagliptin (TRADJENTA) 5 MG TABS tablet Take 5 mg by mouth daily.    [provider]  lisinopril (ZESTRIL) 10 MG tablet Take 1 tablet (10 mg total) by mouth daily. 07/29/21 10/27/21  Loel Dubonnet, NP  metoprolol succinate (TOPROL-XL) 50 MG 24 hr tablet TAKE 1 TABLET BY MOUTH DAILY WITH OR IMMEDIATELY FOLLOWING A MEAL 07/16/17   Weaver, Scott T, PA-C  Multiple Vitamins-Minerals (PRESERVISION AREDS 2) CAPS Take 1 tablet by mouth 2 (two) times daily.    [provider]  omeprazole (PRILOSEC) 40 MG capsule Take 40 mg by mouth daily.    [provider]  Polyethyl Glycol-Propyl Glycol (SYSTANE OP) Apply 1 drop to eye 3 (three) times daily.    [provider]    Family History Family History  Problem Relation Age of Onset   Heart disease Father    Valvular heart disease Father        aortic valve replacement   Pancreatitis Mother    Diabetes Maternal Grandmother    Heart disease Brother    Valvular heart disease Brother        aortic valve replacement   CAD Son        heart attack   Kidney disease Son     Social History Social History   Tobacco Use   Smoking status: Never   Smokeless tobacco: Never  Vaping Use   Vaping Use: Never used  Substance Use Topics   Alcohol use: No   Drug use: No     Allergies   Adhesive [tape], Codeine, and Empagliflozin   Review of Systems Review of  Systems Per HPI  Physical Exam Triage Vital Signs ED Triage Vitals [12/04/21 1048]  Enc Vitals Group     BP (!) 166/93     Pulse Rate 69     Resp 16  Temp (!) 97.5 F (36.4 C)     Temp Source Oral     SpO2 95 %     Weight      Height      Head Circumference      Peak Flow      Pain Score 5     Pain Loc      Pain Edu?      Excl. in [place]?    No data found.  Updated Vital Signs BP (!) 166/93 (BP Location: Left Arm)    Pulse 69    Temp (!) 97.5 F (36.4 C) (Oral)    Resp 16    SpO2 95%   Visual Acuity Right Eye Distance:   Left Eye Distance:   Bilateral Distance:    Right Eye Near:   Left Eye Near:    Bilateral Near:     Physical Exam Constitutional:      General: She is not in acute distress.    Appearance: Normal appearance. She is not toxic-appearing or diaphoretic.  HENT:     Head: Normocephalic and atraumatic.  Eyes:     Extraocular Movements: Extraocular movements intact.     Conjunctiva/sclera: Conjunctivae normal.  Pulmonary:     Effort: Pulmonary effort is normal.  Musculoskeletal:     Right hand: Normal.     Left hand: Swelling and tenderness present. No deformity or bony tenderness. Normal range of motion. Normal strength. Normal sensation. There is no disruption of two-point discrimination. Normal capillary refill. Normal pulse.     Comments: Tenderness to palpation to dorsal surface of left hand directly below second digit.  No erythema, lacerations, abrasions noted.  Patient has grip strength 5/5.  Associated swelling noted to area of tenderness.  Neurovascular intact.  Neurological:     General: No focal deficit present.     Mental Status: She is alert and oriented to person, place, and time. Mental status is at baseline.  Psychiatric:        Mood and Affect: Mood normal.        Behavior: Behavior normal.        Thought Content: Thought content normal.        Judgment: Judgment normal.     UC Treatments / Results  Labs (all labs ordered  are listed, but only abnormal results are displayed) Labs Reviewed - No data to display  EKG   Radiology DG Hand Complete Left  Result Date: 12/04/2021 CLINICAL DATA:  Hammer injury 2 weeks prior with persistent bruising, swelling and pain in the first and second rays EXAM: LEFT HAND - COMPLETE 3+ VIEW COMPARISON:  None. FINDINGS: No fracture or dislocation. Mild osteoarthritis in the first carpometacarpal joint, in the second and third MCP joints and in the third proximal interphalangeal joint. No suspicious focal osseous lesions. No radiopaque foreign bodies. IMPRESSION: No fracture or dislocation. Mild polyarticular osteoarthritis as detailed. Electronically Signed   By: Ilona Sorrel M.D.   On: 12/04/2021 11:14    Procedures Procedures (including critical care time)  Medications Ordered in UC Medications - No data to display  Initial Impression / Assessment and Plan / UC Course  I have reviewed the triage vital signs and the nursing notes.  Pertinent labs & imaging results that were available during my care of the patient were reviewed by me and considered in my medical decision making (see chart for details).     Left hand x-ray was negative for any acute bony abnormality.  Suspect  contusion.  No red flags and patient is neurovascularly intact.  Patient to use ice application.  Discussed supportive care and symptom management with patient.  Patient to follow-up with urgent care or PCP if symptoms persist.  Patient verbalized understanding and was agreeable with plan. Final Clinical Impressions(s) / UC Diagnoses   Final diagnoses:  Left hand pain     Discharge Instructions      Your x-ray was negative for any fracture.  Please use ice application.    ED Prescriptions   None    PDMP not reviewed this encounter.   Teodora Medici, [place] 12/04/21 1130

## 2021-12-04 NOTE — Discharge Instructions (Signed)
Your x-ray was negative for any fracture.  Please use ice application.

## 2021-12-04 NOTE — ED Triage Notes (Signed)
Hit hand with a hammer 2 weeks ago, bruising, swelling, pain not improved since

## 2021-12-09 ENCOUNTER — Other Ambulatory Visit: Payer: Self-pay

## 2021-12-09 ENCOUNTER — Ambulatory Visit: Payer: Medicare Other | Admitting: Cardiology

## 2021-12-09 ENCOUNTER — Encounter: Payer: Self-pay | Admitting: Cardiology

## 2021-12-09 DIAGNOSIS — I7121 Aneurysm of the ascending aorta, without rupture: Secondary | ICD-10-CM

## 2021-12-09 DIAGNOSIS — E119 Type 2 diabetes mellitus without complications: Secondary | ICD-10-CM

## 2021-12-09 DIAGNOSIS — E7800 Pure hypercholesterolemia, unspecified: Secondary | ICD-10-CM

## 2021-12-09 DIAGNOSIS — R0789 Other chest pain: Secondary | ICD-10-CM

## 2021-12-09 DIAGNOSIS — Z8673 Personal history of transient ischemic attack (TIA), and cerebral infarction without residual deficits: Secondary | ICD-10-CM

## 2021-12-09 DIAGNOSIS — I10 Essential (primary) hypertension: Secondary | ICD-10-CM

## 2021-12-09 NOTE — Assessment & Plan Note (Signed)
Thankfully doing well without any recurrence.  Cardiac catheterization widely patent arteries.  Noncardiac.

## 2021-12-09 NOTE — Assessment & Plan Note (Signed)
Atorvastatin 10 mg once a day.  LDL 63.  Excellent.  Diabetic.

## 2021-12-09 NOTE — Assessment & Plan Note (Signed)
Followed closely by Dr. Leavy Cella.  Medications reviewed including [place].  Excellent.  Hemoglobin A1c last checked 7.3.

## 2021-12-09 NOTE — Progress Notes (Signed)
Cardiology Office Note:    Date:  12/09/2021   ID:  [name] BlinksVirginia L [name], DOB 19-Oct-1946, MRN [id number]  PCP:  Verlon AuBoyd, Tammy [name], MD   [facility] Providers Cardiologist:  Donato SchultzMark Debanhi Blaker, MD     Referring MD: Verlon AuBoyd, Tammy [name], MD    History of Present Illness:    [name] is a 76 y.o. 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  She was admitted on 06/10/2021 with chest pain.  She underwent left heart cath that showed widely patent coronary arteries with only minimal luminal irregularities in the LAD and circumflex.  Echocardiogram showed EF of 55 to 60%.  Grade 1 diastolic dysfunction, mild MR.  She was taking some flooring which was a trip hazard in her house and used a crowbar and a hammer and slipped and hit her hand.  She still has a bruise.  Went to urgent care.  She feels some mild shortness of breath with activity.  Past Medical History:  Diagnosis Date   Arthritis    Bronchitis    hx   Chest pain 2009   MV with no scar or ischemia, EF 65%   Complication of anesthesia    claustrophobic !   Diabetes mellitus    GERD (gastroesophageal reflux disease)    History of cardiac catheterization    a. Myoview 2/15 with anteroapical ischemia, EF 64% (intermediate risk) >> LHC - normal cos, EF 55-60%   Hypertension    Neuromuscular disorder (HCC)    arotic Aneurysm   Obesity (BMI 35.0-39.9 without comorbidity)    Pneumonia    Stroke [facility](HCC)    with some resid L sided weakness   Thoracic ascending aortic aneurysm    a. CT 1/17: ascending aorta 4.2 x 4.0 cm    Past Surgical History:  Procedure Laterality Date   ABDOMINAL HYSTERECTOMY  2006   Partial, one ovary left    [place]   CHOLECYSTECTOMY     EAR CYST EXCISION Left 09/20/2016   Procedure: EXCISION  LEFT EAR LESION;  Surgeon: Newman PiesSu Teoh, MD;   Location: [facility] OR;  Service: ENT;  Laterality: Left;   LEFT HEART CATH AND CORONARY ANGIOGRAPHY N/A 06/10/2021   Procedure: LEFT HEART CATH AND CORONARY ANGIOGRAPHY;  Surgeon: Tonny Bollmanooper, Michael, MD;  [facility] LAB;  Service: Cardiovascular;  Laterality: N/A;   LEFT HEART CATHETERIZATION WITH CORONARY ANGIOGRAM N/A 01/09/2014   Procedure: LEFT HEART CATHETERIZATION WITH CORONARY ANGIOGRAM;  Surgeon: Marykay Lexavid W Harding, MD;  [facility];  Service: Cardiovascular;  Laterality: N/A;   MENISCUS REPAIR Right    ROTATOR CUFF REPAIR Left    SKIN SPLIT GRAFT Left 09/20/2016   Procedure: SKIN GRAFT SPLIT THICKNESS;  Surgeon: Newman PiesSu Teoh, MD;  Location: [facility] OR;  Service: ENT;  Laterality: Left;   TEMPOROMANDIBULAR JOINT SURGERY     TONSILLECTOMY     WISDOM TOOTH EXTRACTION      Current Medications: Current Meds  Medication Sig   acetaminophen (TYLENOL) 500 MG tablet Take 1 tablet (500 mg total) by mouth every 6 (six) hours as needed.   albuterol (PROVENTIL HFA;VENTOLIN HFA) 108 (90 BASE) MCG/ACT inhaler Inhale 1-2 puffs into the lungs every 6 (six) hours as needed for wheezing.   aspirin EC 81 MG tablet Take 81 mg by mouth daily.   atorvastatin (LIPITOR) 10 MG tablet Take 10 mg  by mouth daily.   Cholecalciferol (VITAMIN D3) 1000 units CAPS Take 1 capsule by mouth daily.   dapagliflozin propanediol (FARXIGA) 5 MG TABS tablet Take 5 mg by mouth daily.   linagliptin (TRADJENTA) 5 MG TABS tablet Take 5 mg by mouth daily.   lisinopril (ZESTRIL) 10 MG tablet Take 1 tablet (10 mg total) by mouth daily.   metoprolol succinate (TOPROL-XL) 50 MG 24 hr tablet TAKE 1 TABLET BY MOUTH DAILY WITH OR IMMEDIATELY FOLLOWING A MEAL   Multiple Vitamins-Minerals (PRESERVISION AREDS 2) CAPS Take 1 tablet by mouth 2 (two) times daily.   omeprazole (PRILOSEC) 40 MG capsule Take 40 mg by mouth daily.     Allergies:   Adhesive [tape], Codeine, and Empagliflozin   Social History   Socioeconomic History    Marital status: Widowed    Spouse name: Not on file   Number of children: Not on file   Years of education: Not on file   Highest education level: Not on file  Occupational History   Occupation: Caregiver for the elderly    Employer: RETIRED  Tobacco Use   Smoking status: Never   Smokeless tobacco: Never  Vaping Use   Vaping Use: Never used  Substance and Sexual Activity   Alcohol use: No   Drug use: No   Sexual activity: Not Currently    Birth control/protection: Post-menopausal  Other Topics Concern   Not on file  Social History Narrative   Family History Details: Her mother died at age 72 of pancreatitis. Her father died of mitral valve disease at age 52. She has a brother who has had myocardial infarction and also has had bypass surgery and a sister who    has a pacemaker.         Social Determinants of Health   Financial Resource Strain: Not on file  Food Insecurity: Not on file  Transportation Needs: Not on file  Physical Activity: Not on file  Stress: Not on file  Social Connections: Not on file     Family History: The patient's family history includes CAD in her son; Diabetes in her maternal grandmother; Heart disease in her brother and father; Kidney disease in her son; Pancreatitis in her mother; Valvular heart disease in her brother and father.  ROS:   Please see the history of present illness.     All other systems reviewed and are negative.  EKGs/Labs/Other Studies Reviewed:    The following studies were reviewed today: Hospital records, echocardiogram, cardiac cath  Recent Labs: 06/09/2021: TSH 0.670 06/10/2021: ALT 9; Hemoglobin 13.0; Platelets 222 08/15/2021: BUN 19; Creatinine, Ser 1.04; Potassium 3.7; Sodium 142  Recent Lipid Panel    Component Value Date/Time   CHOL 113 06/10/2021 0422   TRIG 78 06/10/2021 0422   HDL 34 (L) 06/10/2021 0422   CHOLHDL 3.3 06/10/2021 0422   VLDL 16 06/10/2021 0422   LDLCALC 63 06/10/2021 0422     Risk  Assessment/Calculations:              Physical Exam:    VS:  BP 130/80 (BP Location: Left Arm, Patient Position: Sitting)    Pulse (!) 58    Ht 5\' 3"  (1.6 m)    Wt 186 lb (84.4 kg)    SpO2 96%    BMI 32.95 kg/m     Wt Readings from Last 3 Encounters:  12/09/21 186 lb (84.4 kg)  07/29/21 185 lb (83.9 kg)  06/11/21 177 lb 12.8 oz (80.6 kg)  GEN:  Well nourished, well developed in no acute distress HEENT: Normal NECK: No JVD; No carotid bruits LYMPHATICS: No lymphadenopathy CARDIAC: RRR, no murmurs, no rubs, gallops RESPIRATORY:  Clear to auscultation without rales, wheezing or rhonchi  ABDOMEN: Soft, non-tender, non-distended MUSCULOSKELETAL:  No edema; No deformity  SKIN: Warm and dry NEUROLOGIC:  Alert and oriented x 3 PSYCHIATRIC:  Normal affect   ASSESSMENT:    1. Aneurysm of ascending aorta without rupture   2. Other chest pain   3. Diabetes mellitus with coincident hypertension (HCC)   4. Pure hypercholesterolemia   5. History of stroke    PLAN:    In order of problems listed above:  Ascending aortic aneurysm 40 mm on CT scan in July 2022.  Very mild.  Reassuring.  Echocardiogram also shows 40 mm in 2021.  Continue with optimal blood pressure control.  Avoid vigorous Valsalva maneuvers.  Chest pain Thankfully doing well without any recurrence.  Cardiac catheterization widely patent arteries.  Noncardiac.  Diabetes mellitus with coincident hypertension (HCC) Followed closely by Dr. Leavy Cella.  Medications reviewed including [place].  Excellent.  Hemoglobin A1c last checked 7.3.  Pure hypercholesterolemia Atorvastatin 10 mg once a day.  LDL 63.  Excellent.  Diabetic.  History of stroke We will go ahead and continue with statin optimal blood pressure control and diabetic control.         Medication Adjustments/Labs and Tests Ordered: Current medicines are reviewed at length with the patient today.  Concerns regarding medicines are outlined above.  No  orders of the defined types were placed in this encounter.  No orders of the defined types were placed in this encounter.   Patient Instructions  Medication Instructions:  The current medical regimen is effective;  continue present plan and medications.  *If you need a refill on your cardiac medications before your next appointment, please call your pharmacy*  Follow-Up: At [facility], you and your health needs are our priority.  As part of our continuing mission to provide you with exceptional heart care, we have created designated Provider Care Teams.  These Care Teams include your primary Cardiologist (physician) and Advanced Practice Providers (APPs -  Physician Assistants and Nurse Practitioners) who all work together to provide you with the care you need, when you need it.  We recommend signing up for the patient portal called "MyChart".  Sign up information is provided on this After Visit Summary.  MyChart is used to connect with patients for Virtual Visits (Telemedicine).  Patients are able to view lab/test results, encounter notes, upcoming appointments, etc.  Non-urgent messages can be sent to your provider as well.   To learn more about what you can do with MyChart, go to [email or web address].    Your next appointment:   1 year(s)  The format for your next appointment:   In Person  Provider:   Donato Schultz, MD    Thank you for choosing [facility]!!      Signed, Donato Schultz, MD  12/09/2021 4:35 PM    [facility]

## 2021-12-09 NOTE — Assessment & Plan Note (Signed)
40 mm on CT scan in July 2022.  Very mild.  Reassuring.  Echocardiogram also shows 40 mm in 2021.  Continue with optimal blood pressure control.  Avoid vigorous Valsalva maneuvers.

## 2021-12-09 NOTE — Patient Instructions (Signed)
Medication Instructions:  The current medical regimen is effective;  continue present plan and medications.  *If you need a refill on your cardiac medications before your next appointment, please call your pharmacy*  Follow-Up: At [facility], you and your health needs are our priority.  As part of our continuing mission to provide you with exceptional heart care, we have created designated Provider Care Teams.  These Care Teams include your primary Cardiologist (physician) and Advanced Practice Providers (APPs -  Physician Assistants and Nurse Practitioners) who all work together to provide you with the care you need, when you need it.  We recommend signing up for the patient portal called "MyChart".  Sign up information is provided on this After Visit Summary.  MyChart is used to connect with patients for Virtual Visits (Telemedicine).  Patients are able to view lab/test results, encounter notes, upcoming appointments, etc.  Non-urgent messages can be sent to your provider as well.   To learn more about what you can do with MyChart, go to [email or web address].    Your next appointment:   1 year(s)  The format for your next appointment:   In Person  Provider:   Mark Skains, MD   Thank you for choosing [facility]!!    

## 2021-12-09 NOTE — Assessment & Plan Note (Signed)
We will go ahead and continue with statin optimal blood pressure control and diabetic control.

## 2022-03-24 ENCOUNTER — Telehealth: Payer: Self-pay

## 2022-03-24 NOTE — Telephone Encounter (Signed)
NOTES SCANNED TO REFERRAL 

## 2022-04-05 ENCOUNTER — Encounter: Payer: Self-pay | Admitting: Emergency Medicine

## 2022-04-05 ENCOUNTER — Ambulatory Visit
Admission: EM | Admit: 2022-04-05 | Discharge: 2022-04-05 | Disposition: A | Discharge status: Home / Self Care | Payer: Medicare Other | Attending: Urgent Care | Admitting: Urgent Care

## 2022-04-05 DIAGNOSIS — H65112 Acute and subacute allergic otitis media (mucoid) (sanguinous) (serous), left ear: Secondary | ICD-10-CM

## 2022-04-05 DIAGNOSIS — I10 Essential (primary) hypertension: Secondary | ICD-10-CM

## 2022-04-05 MED ORDER — AMOXICILLIN 500 MG PO CAPS: 500.0000 mg | ORAL_CAPSULE | Freq: Three times a day (TID) | ORAL | 0 refills | Status: AC

## 2022-04-05 NOTE — ED Triage Notes (Signed)
Pt is present with c/o left ear fullness. Pt sx started x2 weeks ago  ?

## 2022-04-05 NOTE — Discharge Instructions (Signed)
You have what is called a mucoid otitis media, which is due to infected mucous behind your ear drum. ?Please take the antibiotic three times daily until gone. ?Also purchase saline nasal spray and flush several times daily to help open up the eustachian tube. ?Please monitor your blood pressure at home, if it remains elevated, please follow up with your PCP. ?If your headache persists once treatment is complete, please follow up for recheck ?

## 2022-04-05 NOTE — ED Provider Notes (Signed)
?[facility] ? ? ? ?CSN: [id number] ?Arrival date & time: 04/05/22  1213 ? ? ?  ? ?History   ?Chief Complaint ?Chief Complaint  ?Patient presents with  ? Ear Fullness  ?  Left  ? ? ?HPI ?[name] [name] is a 76 y.o. female.  ? ?Pleasant 76yo female presents today complaining of a two week history of ear pressure and "fluid in it". She states she also has a headache on the affected side. Denies nucchal rigidity, weakness of an extremity, slurred speech, or change in vision. Denies any acute gait changes, states "I'm always kinda wobbly." Has known hx of HTN, states she monitors her BP at home with usual readings in the 130s systolic. Denies tinnitus in her ear. No fevers. No tx tried for current sx.  ? ? ?Ear Fullness ? ? ?Past Medical History:  ?Diagnosis Date  ? Arthritis   ? Bronchitis   ? hx  ? Chest pain 2009  ? MV with no scar or ischemia, EF 65%  ? Complication of anesthesia   ? claustrophobic !  ? Diabetes mellitus   ? GERD (gastroesophageal reflux disease)   ? History of cardiac catheterization   ? a. Myoview 2/15 with anteroapical ischemia, EF 64% (intermediate risk) >> LHC - normal cos, EF 55-60%  ? Hypertension   ? Neuromuscular disorder (HCC)   ? arotic Aneurysm  ? Obesity (BMI 35.0-39.9 without comorbidity)   ? Pneumonia   ? Stroke [facility]   ? with some resid L sided weakness  ? Thoracic ascending aortic aneurysm (HCC)   ? a. CT 1/17: ascending aorta 4.2 x 4.0 cm  ? ? ?Patient Active Problem List  ? Diagnosis Date Noted  ? History of stroke 12/09/2021  ? Aortic root dilation (HCC) 06/10/2021  ? Hypokalemia 06/10/2021  ? Unstable angina (HCC) 06/09/2021  ? 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 Encounter for fitting and adjustment of pessary 07/15/2020  ? Chronic sinusitis   ? Complicated migraine   ? Pure hypercholesterolemia   ? TIA (transient ischemic attack)   ? Chest pain   ? Cerebral infarction (HCC) 08/26/2016  ? Chronic diastolic heart failure, NYHA class 2 (HCC)  12/07/2015  ? Pulmonary HTN (HCC) 12/07/2015  ? Chronic chest pain 12/07/2015  ? Ascending aortic aneurysm 12/07/2015  ? Left sided numbness 12/07/2015  ? Bradycardia 05/06/2012  ? CVA (cerebral vascular accident) (HCC) 05/05/2012  ? Obesity due to excess calories 04/25/2012  ? Cystocele 03/22/2012  ? Diabetes mellitus with coincident hypertension (HCC) 03/22/2012  ? Essential hypertension 03/22/2012  ? gerd 03/22/2012  ? ? ?Past Surgical History:  ?Procedure Laterality Date  ? ABDOMINAL HYSTERECTOMY  2006  ? Partial, one ovary left   ? CARDIAC CATHETERIZATION  2004  ? [place] per pt.  ? CHOLECYSTECTOMY    ? EAR CYST EXCISION Left 09/20/2016  ? Procedure: EXCISION  LEFT EAR LESION;  Surgeon: Newman Pies, MD;  Location: [facility] OR;  Service: ENT;  Laterality: Left;  ? LEFT HEART CATH AND CORONARY ANGIOGRAPHY N/A 06/10/2021  ? Procedure: LEFT HEART CATH AND CORONARY ANGIOGRAPHY;  Surgeon: Tonny Bollman, MD;  Location[facility] INVASIVE CV LAB;  Service: Cardiovascular;  Laterality: N/A;  ? LEFT HEART CATHETERIZATION WITH CORONARY ANGIOGRAM N/A 01/09/2014  ? Procedure: LEFT HEART CATHETERIZATION WITH CORONARY ANGIOGRAM;  Surgeon: Marykay Lex, MD;  Location: [facility] CATH LAB;  Service: Cardiovascular;  Laterality: N/A;  ? MENISCUS REPAIR Right   ? ROTATOR CUFF  REPAIR Left   ? SKIN SPLIT GRAFT Left 09/20/2016  ? Procedure: SKIN GRAFT SPLIT THICKNESS;  Surgeon: Newman PiesSu Teoh, MD;  Location: [facility] OR;  Service: ENT;  Laterality: Left;  ? TEMPOROMANDIBULAR JOINT SURGERY    ? TONSILLECTOMY    ? WISDOM TOOTH EXTRACTION    ? ? ?OB History   ? ? Gravida  ?4  ? Para  ?4  ? Term  ?4  ? Preterm  ?0  ? AB  ?0  ? Living  ?4  ?  ? ? SAB  ?0  ? IAB  ?0  ? Ectopic  ?0  ? Multiple  ?0  ? Live Births  ?   ?   ?  ? Obstetric Comments  ?Delivered 4 large babies vaginally  ?  ? ?  ? ? ? ?Home Medications   ? ?Prior to Admission medications   ?Medication Sig Start Date End Date Taking? Authorizing Provider  ?amoxicillin (AMOXIL) 500 MG capsule Take 1  capsule (500 mg total) by mouth 3 (three) times daily for 10 days. 04/05/22 04/15/22 Yes Dashauna Heymann L, PA  ?acetaminophen (TYLENOL) 500 MG tablet Take 1 tablet (500 mg total) by mouth every 6 (six) hours as needed. 10/21/19   Fawze, Mina A, PA-C  ?albuterol (PROVENTIL HFA;VENTOLIN HFA) 108 (90 BASE) MCG/ACT inhaler Inhale 1-2 puffs into the lungs every 6 (six) hours as needed for wheezing. 07/29/13   Reuben LikesKeller, David C, MD  ?aspirin EC 81 MG tablet Take 81 mg by mouth daily.    [provider]  ?atorvastatin (LIPITOR) 10 MG tablet Take 10 mg by mouth daily. 08/05/20   [provider]  ?Cholecalciferol (VITAMIN D3) 1000 units CAPS Take 1 capsule by mouth daily.    [provider]  ?dapagliflozin propanediol (FARXIGA) 5 MG TABS tablet Take 5 mg by mouth daily.    [provider]  ?linagliptin (TRADJENTA) 5 MG TABS tablet Take 5 mg by mouth daily.    [provider]  ?lisinopril (ZESTRIL) 10 MG tablet Take 1 tablet (10 mg total) by mouth daily. 07/29/21 12/09/21  Alver SorrowWalker, Caitlin S, NP  ?metoprolol succinate (TOPROL-XL) 50 MG 24 hr tablet TAKE 1 TABLET BY MOUTH DAILY WITH OR IMMEDIATELY FOLLOWING A MEAL 07/16/17   Tereso NewcomerWeaver, Scott T, PA-C  ?Multiple Vitamins-Minerals (PRESERVISION AREDS 2) CAPS Take 1 tablet by mouth 2 (two) times daily.    [provider]  ?omeprazole (PRILOSEC) 40 MG capsule Take 40 mg by mouth daily.    [provider]  ? ? ?Family History ?Family History  ?Problem Relation Age of Onset  ? Heart disease Father   ? Valvular heart disease Father   ?     aortic valve replacement  ? Pancreatitis Mother   ? Diabetes Maternal Grandmother   ? Heart disease Brother   ? Valvular heart disease Brother   ?     aortic valve replacement  ? CAD Son   ?     heart attack  ? Kidney disease Son   ? ? ?Social History ?Social History  ? ?Tobacco Use  ? Smoking status: Never  ? Smokeless tobacco: Never  ?Vaping Use  ? Vaping Use: Never used  ?Substance Use Topics  ?  Alcohol use: No  ? Drug use: No  ? ? ? ?Allergies   ?Adhesive [tape], Codeine, and Empagliflozin ? ? ?Review of Systems ?Review of Systems ?As per hpi ? ?Physical Exam ?Triage Vital Signs ?ED Triage Vitals  ?Enc Vitals  Group  ?   BP 04/05/22 1257 (!) 181/84  ?   Pulse Rate 04/05/22 1257 (!) 55  ?   Resp 04/05/22 1257 18  ?   Temp 04/05/22 1257 (!) 97.3 ?F (36.3 ?C)  ?   Temp Source 04/05/22 1257 Oral  ?   SpO2 04/05/22 1257 97 %  ?   Weight --   ?   Height --   ?   Head Circumference --   ?   Peak Flow --   ?   Pain Score 04/05/22 1255 0  ?   Pain Loc --   ?   Pain Edu? --   ?   Excl. in GC? --   ? ?No data found. ? ?Updated Vital Signs ?BP (!) 181/84   Pulse (!) 55   Temp (!) 97.3 ?F (36.3 ?C) (Oral)   Resp 18   SpO2 97%  ? ?Visual Acuity ?Right Eye Distance:   ?Left Eye Distance:   ?Bilateral Distance:   ? ?Right Eye Near:   ?Left Eye Near:    ?Bilateral Near:    ? ?Physical Exam ?Vitals and nursing note reviewed. Exam conducted with a chaperone present.  ?Constitutional:   ?   General: She is not in acute distress. ?   Appearance: Normal appearance. She is well-developed. She is not ill-appearing, toxic-appearing or diaphoretic.  ?HENT:  ?   Head: Normocephalic and atraumatic.  ?   Jaw: There is normal jaw occlusion. No trismus, tenderness, swelling, pain on movement or malocclusion.  ?   Salivary Glands: Right salivary gland is not diffusely enlarged or tender. Left salivary gland is not diffusely enlarged or tender.  ?   Right Ear: Tympanic membrane, ear canal and external ear normal.  ?   Left Ear: Ear canal and external ear normal. A middle ear effusion (large green mucoid plug behind L TM) is present. No hemotympanum. Tympanic membrane is not perforated or erythematous. Tympanic membrane has decreased mobility.  ?   Mouth/Throat:  ?   Mouth: Mucous membranes are moist.  ?Eyes:  ?   Extraocular Movements: Extraocular movements intact.  ?   Conjunctiva/sclera: Conjunctivae normal.  ?   Pupils: Pupils are  equal, round, and reactive to light.  ?Neck:  ?   Vascular: No carotid bruit.  ?Cardiovascular:  ?   Rate and Rhythm: Normal rate and regular rhythm.  ?   Heart sounds: No murmur heard. ?Pulmonary:  ?   Effort: Pulmona

## 2022-04-09 NOTE — Progress Notes (Unsigned)
Cardiology Office Note:    Date:  04/13/2022   ID:  [name] [name], DOB February 20, 1946, MRN [id number]  PCP:  Bartholome Bill, MD   [facility] Providers Cardiologist:  Candee Furbish, MD     Referring MD: Antony Blackbird, MD   Chief Complaint: shortness of breath  History of Present Illness:    [name] [name] is a very pleasant  76 y.o. 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  She underwent left heart cath that showed widely patent coronary arteries with only minimal luminal irregularities in the LAD and circumflex.  Echocardiogram showed EF of 55 to 60%, G1 DD, mild MR.   She was last seen in our office on 12/09/2021 by Dr. Marlou Porch at which time no changes were made and 1 year follow-up was advised.  Today, she is here with her daughter. We received referral from PCP for evaluation, patient is unsure of exact reason she was advised to come in for sooner follow-up. She reports more chest pain recently over approximately the last 3 months but upon further discussion she is describing difficulty breathing when lying on left side and palpitations that she feels most often when lying down. Feels better when she turns on right side and elevates her head slightly. Occasionally feels palpitations during the day. Notes some shortness of breath when working in her yard. She denies edema or PND. Occasional lightheadedness for which she has slowed her movements and is careful to sit on edge of bed when getting up from sleep. No presyncope or syncope. No bleeding concerns. No regular exercise - just active in yard and at home.   Past Medical History:  Diagnosis Date   Arthritis    Bronchitis    hx   Chest pain 2009   MV with no scar or ischemia, EF [id number]   Complication of anesthesia    claustrophobic !   Diabetes mellitus    GERD (gastroesophageal reflux disease)    History of  cardiac catheterization    a. Myoview 2/15 with anteroapical ischemia, EF 64% (intermediate risk) >> LHC - normal cos, EF 55-60%   Hypertension    Neuromuscular disorder (HCC)    arotic Aneurysm   Obesity (BMI 35.0-39.9 without comorbidity)    Pneumonia    Stroke [facility])    with some resid L sided weakness   Thoracic ascending aortic aneurysm ([place])    a. CT 1/17: ascending aorta 4.2 x 4.0 cm    Past Surgical History:  Procedure Laterality Date   ABDOMINAL HYSTERECTOMY  2006   Partial, one ovary left    CARDIAC CATHETERIZATION  2004   [place], [place].; [place] per pt.   CHOLECYSTECTOMY     EAR CYST EXCISION Left 09/20/2016   Procedure: EXCISION  LEFT EAR LESION;  Surgeon: Leta Baptist, MD;  Location: [facility] OR;  Service: ENT;  Laterality: Left;   LEFT HEART CATH AND CORONARY ANGIOGRAPHY N/A 06/10/2021   Procedure: LEFT HEART CATH AND CORONARY ANGIOGRAPHY;  Surgeon: Sherren Mocha, MD;  Location: [facility];  Service: Cardiovascular;  Laterality: N/A;   LEFT HEART CATHETERIZATION WITH CORONARY ANGIOGRAM N/A 01/09/2014   Procedure: LEFT HEART CATHETERIZATION WITH CORONARY ANGIOGRAM;  Surgeon: Leonie Man, MD;  Location[facility] CATH LAB;  Service: Cardiovascular;  Laterality: N/A;   MENISCUS REPAIR Right    ROTATOR CUFF REPAIR Left    SKIN SPLIT GRAFT Left 09/20/2016  Procedure: SKIN GRAFT SPLIT THICKNESS;  Surgeon: Leta Baptist, MD;  Location: [facility] OR;  Service: ENT;  Laterality: Left;   TEMPOROMANDIBULAR JOINT SURGERY     TONSILLECTOMY     WISDOM TOOTH EXTRACTION      Current Medications: Current Meds  Medication Sig   acetaminophen (TYLENOL) 500 MG tablet Take 1 tablet (500 mg total) by mouth every 6 (six) hours as needed.   albuterol (PROVENTIL HFA;VENTOLIN HFA) 108 (90 BASE) MCG/ACT inhaler Inhale 1-2 puffs into the lungs every 6 (six) hours as needed for wheezing.   amoxicillin (AMOXIL) 500 MG capsule Take 1 capsule (500 mg total) by mouth 3 (three) times daily for 10 days.   aspirin EC 81  MG tablet Take 81 mg by mouth daily.   atorvastatin (LIPITOR) 10 MG tablet Take 10 mg by mouth daily.   Cholecalciferol (VITAMIN D3) 1000 units CAPS Take 1 capsule by mouth daily.   dapagliflozin propanediol (FARXIGA) 5 MG TABS tablet Take 5 mg by mouth daily.   fluticasone (FLONASE) 50 MCG/ACT nasal spray Place 2 sprays into both nostrils daily.   glimepiride (AMARYL) 2 MG tablet Take 2 mg by mouth daily.   linagliptin (TRADJENTA) 5 MG TABS tablet Take 5 mg by mouth daily.   lisinopril (ZESTRIL) 10 MG tablet Take 1 tablet (10 mg total) by mouth daily.   Multiple Vitamins-Minerals (PRESERVISION AREDS 2) CAPS Take 1 tablet by mouth 2 (two) times daily.   omeprazole (PRILOSEC) 40 MG capsule Take 40 mg by mouth daily.   [DISCONTINUED] metoprolol succinate (TOPROL-XL) 50 MG 24 hr tablet TAKE 1 TABLET BY MOUTH DAILY WITH OR IMMEDIATELY FOLLOWING A MEAL     Allergies:   Adhesive [tape], Codeine, and Empagliflozin   Social History   Socioeconomic History   Marital status: Widowed    Spouse name: Not on file   Number of children: Not on file   Years of education: Not on file   Highest education level: Not on file  Occupational History   Occupation: Caregiver for the elderly    Employer: RETIRED  Tobacco Use   Smoking status: Never   Smokeless tobacco: Never  Vaping Use   Vaping Use: Never used  Substance and Sexual Activity   Alcohol use: No   Drug use: No   Sexual activity: Not Currently    Birth control/protection: Post-menopausal  Other Topics Concern   Not on file  Social History Narrative   Family History Details: Her mother died at age 3 of pancreatitis. Her father died of mitral valve disease at age 27. She has a brother who has had myocardial infarction and also has had bypass surgery and a sister who    has a pacemaker.         Social Determinants of Health   Financial Resource Strain: Not on file  Food Insecurity: Not on file  Transportation Needs: Not on file   Physical Activity: Not on file  Stress: Not on file  Social Connections: Not on file     Family History: The patient's family history includes CAD in her son; Diabetes in her maternal grandmother; Heart disease in her brother and father; Kidney disease in her son; Pancreatitis in her mother; Valvular heart disease in her brother and father.  ROS:   Please see the history of present illness.    + palpitations + dyspnea on exertion All other systems reviewed and are negative.  Labs/Other Studies Reviewed:    The following studies were reviewed today:  LHC 06/10/21  Widely patent coronary arteries with minimal irregularities in the LAD and LCx, angiographically normal left main and RCA. Normal LVEDP Tortuous right subclavian artery   Suspect non-cardiac chest pain   Echo 06/10/21   1. Compared to 02/09/20, findings are similar.   2. Left ventricular ejection fraction, by estimation, is 55 to 60%. The  left ventricle has normal function. The left ventricle has no regional  wall motion abnormalities. Left ventricular diastolic parameters are  consistent with Grade I diastolic  dysfunction (impaired relaxation).   3. Right ventricular systolic function is normal. The right ventricular  size is normal. There is mildly elevated pulmonary artery systolic  pressure.   4. The mitral valve is normal in structure. Mild mitral valve  regurgitation. No evidence of mitral stenosis.   5. The aortic valve is tricuspid. Aortic valve regurgitation is trivial.  No aortic stenosis is present.   6. Aortic dilatation noted. There is mild dilatation of the ascending  aorta and of the aortic root, measuring 38 mm.   7. The inferior vena cava is normal in size with greater than 50%  respiratory variability, suggesting right atrial pressure of 3 mmHg.    Cardiac monitor 10/11/16  No atrial fibrillation No pauses Average heart rate 62 bpm   Reassuring monitor  Recent Labs: 06/09/2021: TSH  0.670 06/10/2021: ALT 9; Hemoglobin 13.0; Platelets 222 08/15/2021: BUN 19; Creatinine, Ser 1.04; Potassium 3.7; Sodium 142  Recent Lipid Panel    Component Value Date/Time   CHOL 113 06/10/2021 0422   TRIG 78 06/10/2021 0422   HDL 34 (L) 06/10/2021 0422   CHOLHDL 3.3 06/10/2021 0422   VLDL 16 06/10/2021 0422   LDLCALC 63 06/10/2021 0422     Risk Assessment/Calculations:       Physical Exam:    VS:  BP (!) 160/76   Pulse 68   Ht 5\' 3"  (1.6 m)   Wt 189 lb 12.8 oz (86.1 kg)   SpO2 96%   BMI 33.62 kg/m    Vitals:   04/13/22 0819 04/13/22 1618  BP: (!) 150/72 (!) 160/76  Pulse: 68   SpO2: 96%     Wt Readings from Last 3 Encounters:  04/13/22 189 lb 12.8 oz (86.1 kg)  12/09/21 186 lb (84.4 kg)  07/29/21 185 lb (83.9 kg)     GEN:  Well nourished, well developed in no acute distress HEENT: Normal NECK: No JVD; No carotid bruits CARDIAC: RRR, no murmurs, rubs, gallops RESPIRATORY:  Clear to auscultation without rales, wheezing or rhonchi  ABDOMEN: Soft, non-tender, non-distended MUSCULOSKELETAL:  No edema; No deformity. 2+ pedal pulses, equal bilaterally SKIN: Warm and dry NEUROLOGIC:  Alert and oriented x 3 PSYCHIATRIC:  Normal affect   EKG:  EKG is ordered today.  The ekg ordered today demonstrates NSR at 60 bpm, TWI V4-V5, no acute change from previous  Diagnoses:    1. Palpitations   2. Aneurysm of ascending aorta without rupture ([place])   3. Essential hypertension   4. Dyspnea on exertion   5. Hyperlipidemia LDL goal <70    Assessment and Plan:     Dyspnea: Symptoms of dyspnea on exertion when working in the yard and difficulty breathing when lying on her left side accompanied by palpitations.  Widely patent coronary arteries, normal LVEDP by cath July 2022.  Echo 05/2021 revealed normal LVEF 55 to 60%, G1 DD, mild MR. Symptoms improve when she turns to her right side.  Appears euvolemic on exam. She  denies edema, orthopnea, or PND.  We will place a cardiac  monitor for evaluation of arrhythmias that may be contributing. Could consider repeat echocardiogram if monitor is unrevealing.   Palpitations: Occasional palpitations that are most noticeable when lying on her left side in bed.  She is not particularly symptomatic. We will get cardiac monitor for evaluation of arrhythmias.  Continue metoprolol.   Thoracic aneurysm without rupture: Measures 38 mm on echo 05/2021.  No symptoms concerning for rupture. We will continue to follow with imaging in 1-2 years, sooner if clinically indicated.   Hypertension: BP is elevated today, higher by my recheck.  Elevated BP may be contributing to symptoms. She does not monitor at home.  I have asked her to monitor on a consistent basis and report to [place] in 2 weeks.  Would favor addition of spironolactone if BP remains elevated.  Hyperlipidemia LDL goal < 70: LDL 63 on 06/10/21. Continue atorvastatin.  Disposition: 3 months with Dr. Marlou Porch or APP     Medication Adjustments/Labs and Tests Ordered: Current medicines are reviewed at length with the patient today.  Concerns regarding medicines are outlined above.  Orders Placed This Encounter  Procedures   LONG TERM MONITOR (3-14 DAYS)   EKG 12-Lead   Meds ordered this encounter  Medications   metoprolol succinate (TOPROL-XL) 50 MG 24 hr tablet    Sig: TAKE 1 TABLET BY MOUTH DAILY WITH OR IMMEDIATELY FOLLOWING A MEAL    Dispense:  90 tablet    Refill:  3    Patient Instructions  Medication Instructions:   Your physician recommends that you continue on your current medications as directed. Please refer to the Current Medication list given to you today.   *If you need a refill on your cardiac medications before your next appointment, please call your pharmacy*   Lab Work:  None ordered.   If you have labs (blood work) drawn today and your tests are completely normal, you will receive your results only by: [place] (if you have MyChart) OR A  paper copy in the mail If you have any lab test that is abnormal or we need to change your treatment, we will call you to review the results.   Testing/Procedures:  A zio monitor was ordered today. It will remain on for 14 days. You will then return monitor and event diary in provided box. It takes 1-2 weeks for report to be downloaded and returned to [place]. We will call you with the results. If monitor falls off or has orange flashing light, please call [employer] for further instructions.   ZIO XT- Long Term Monitor Instructions  Your physician has requested you wear a ZIO patch monitor for 14 days.  This is a single patch monitor. [employer] supplies one patch monitor per enrollment. Additional stickers are not available. Please do not apply patch if you will be having a Nuclear Stress Test,  Echocardiogram, Cardiac CT, MRI, or Chest Xray during the period you would be wearing the  monitor. The patch cannot be worn during these tests. You cannot remove and re-apply the  ZIO XT patch monitor.  Your ZIO patch monitor will be mailed 3 day USPS to your address on file. It may take 3-5 days  to receive your monitor after you have been enrolled.  Once you have received your monitor, please review the enclosed instructions. Your monitor  has already been registered assigning a specific monitor serial # to you.  Billing and Patient Assistance Program  Information  We have supplied [employer] with any of your insurance information on file for billing purposes. [employer] offers a sliding scale Patient Assistance Program for patients that do not have  insurance, or whose insurance does not completely cover the cost of the ZIO monitor.  You must apply for the Patient Assistance Program to qualify for this discounted rate.  To apply, please call [employer] at [phone], select option 4, select option 2, ask to apply for  Patient Assistance Program. Theodore Demark will ask your household income, and how many people  are in  your household. They will quote your out-of-pocket cost based on that information.  [employer] will also be able to set up a 67-month, interest-free payment plan if needed.  Applying the monitor   Shave hair from upper left chest.  Hold abrader disc by orange tab. Rub abrader in 40 strokes over the upper left chest as  indicated in your monitor instructions.  Clean area with 4 enclosed alcohol pads. Let dry.  Apply patch as indicated in monitor instructions. Patch will be placed under collarbone on left  side of chest with arrow pointing upward.  Rub patch adhesive wings for 2 minutes. Remove white label marked "1". Remove the white  label marked "2". Rub patch adhesive wings for 2 additional minutes.  While looking in a mirror, press and release button in center of patch. A small green light will  flash 3-4 times. This will be your only indicator that the monitor has been turned on.  Do not shower for the first 24 hours. You may shower after the first 24 hours.  Press the button if you feel a symptom. You will hear a small click. Record Date, Time and  Symptom in the Patient Logbook.  When you are ready to remove the patch, follow instructions on the last 2 pages of Patient  Logbook. Stick patch monitor onto the last page of Patient Logbook.  Place Patient Logbook in the blue and white box. Use locking tab on box and tape box closed  securely. The blue and white box has prepaid postage on it. Please place it in the mailbox as  soon as possible. Your physician should have your test results approximately 7 days after the  monitor has been mailed back to [facility].  Call [facility] at [phone] if you have questions regarding  your ZIO XT patch monitor. Call them immediately if you see an orange light blinking on your  monitor.  If your monitor falls off in less than 4 days, contact our Monitor department at [phone].  If your monitor becomes loose or falls off  after 4 days call [employer] at [phone] for  suggestions on securing your monitor    Follow-Up: At [facility], you and your health needs are our priority.  As part of our continuing mission to provide you with exceptional heart care, we have created designated Provider Care Teams.  These Care Teams include your primary Cardiologist (physician) and Advanced Practice Providers (APPs -  Physician Assistants and Nurse Practitioners) who all work together to provide you with the care you need, when you need it.  We recommend signing up for the patient portal called "MyChart".  Sign up information is provided on this After Visit Summary.  MyChart is used to connect with patients for Virtual Visits (Telemedicine).  Patients are able to view lab/test results, encounter notes, upcoming appointments, etc.  Non-urgent messages can be sent to your provider as well.  To learn more about what you can do with MyChart, go to [email or web address].    Your next appointment:   3 month(s)  The format for your next appointment:   In Person  Provider:   Christen Bame, NP         Other Instructions HOW TO TAKE YOUR BLOOD PRESSURE: Rest 5 minutes before taking your blood pressure.  Don't smoke or drink caffeinated beverages for at least 30 minutes before. Take your blood pressure before (not after) you eat. Sit comfortably with your back supported and both feet on the floor (don't cross your legs). Elevate your arm to heart level on a table or a desk. Use the proper sized cuff. It should fit smoothly and snugly around your bare upper arm. There should be enough room to slip a fingertip under the cuff. The bottom edge of the cuff should be 1 inch above the crease of the elbow.  Blood Pressure Record Sheet To take your blood pressure, you will need a blood pressure machine. You may be prescribed one, or you can buy a blood pressure machine (blood pressure monitor) at your clinic, drug store, or  online. When choosing one, look for these features: An automatic monitor that has an arm cuff. A cuff that wraps snugly, but not too tightly, around your upper arm. You should be able to fit only one finger between your arm and the cuff. A device that stores blood pressure reading results. Do not choose a monitor that measures your blood pressure from your wrist or finger. Follow your health care provider's instructions for how to take your blood pressure. To use this form: Get one reading in the morning (a.m.) before you take any medicines. Get one reading in the evening (p.m.) before supper. Take at least two readings with each blood pressure check. This makes sure the results are correct. Wait 1-2 minutes between measurements. Write down the results in the spaces on this form. Repeat this once a week, or as told by your health care provider. Make a follow-up appointment with your health care provider to discuss the results. Blood pressure log Date: _______________________ a.m. _____________________(1st reading) _____________________(2nd reading) p.m. _____________________(1st reading) _____________________(2nd reading) Date: _______________________ a.m. _____________________(1st reading) _____________________(2nd reading) p.m. _____________________(1st reading) _____________________(2nd reading) Date: _______________________ a.m. _____________________(1st reading) _____________________(2nd reading) p.m. _____________________(1st reading) _____________________(2nd reading) Date: _______________________ a.m. _____________________(1st reading) _____________________(2nd reading) p.m. _____________________(1st reading) _____________________(2nd reading) Date: _______________________ a.m. _____________________(1st reading) _____________________(2nd reading) p.m. _____________________(1st reading) _____________________(2nd reading) This information is not intended to replace advice given to  you by your health care provider. Make sure you discuss any questions you have with your health care provider. Document Revised: 07/21/2021 Document Reviewed: 07/21/2021 Elsevier Patient Education  [place].  Please send BP readings X 2 weeks to Christen Bame, NP either [place] or you can phone in to [phone].   Important Information About Sugar         Signed, Emmaline Life, NP  04/13/2022 4:28 PM    [facility]

## 2022-04-13 ENCOUNTER — Encounter: Payer: Self-pay | Admitting: Nurse Practitioner

## 2022-04-13 ENCOUNTER — Ambulatory Visit (INDEPENDENT_AMBULATORY_CARE_PROVIDER_SITE_OTHER): Payer: Medicare Other

## 2022-04-13 ENCOUNTER — Ambulatory Visit: Payer: Medicare Other | Admitting: Nurse Practitioner

## 2022-04-13 VITALS — BP 160/76 | HR 68 | Ht 63.0 in | Wt 189.8 lb

## 2022-04-13 DIAGNOSIS — R0609 Other forms of dyspnea: Secondary | ICD-10-CM

## 2022-04-13 DIAGNOSIS — I7121 Aneurysm of the ascending aorta, without rupture: Secondary | ICD-10-CM

## 2022-04-13 DIAGNOSIS — R002 Palpitations: Secondary | ICD-10-CM | POA: Diagnosis not present

## 2022-04-13 DIAGNOSIS — I10 Essential (primary) hypertension: Secondary | ICD-10-CM | POA: Diagnosis not present

## 2022-04-13 DIAGNOSIS — E785 Hyperlipidemia, unspecified: Secondary | ICD-10-CM

## 2022-04-13 MED ORDER — METOPROLOL SUCCINATE ER 50 MG PO TB24: ORAL_TABLET | ORAL | 3 refills | Status: AC

## 2022-04-13 NOTE — Patient Instructions (Signed)
Medication Instructions:   Your physician recommends that you continue on your current medications as directed. Please refer to the Current Medication list given to you today.   *If you need a refill on your cardiac medications before your next appointment, please call your pharmacy*   Lab Work:  None ordered.   If you have labs (blood work) drawn today and your tests are completely normal, you will receive your results only by: MyChart Message (if you have MyChart) OR A paper copy in the mail If you have any lab test that is abnormal or we need to change your treatment, we will call you to review the results.   Testing/Procedures:  A zio monitor was ordered today. It will remain on for 14 days. You will then return monitor and event diary in provided box. It takes 1-2 weeks for report to be downloaded and returned to [place]. We will call you with the results. If monitor falls off or has orange flashing light, please call Zio for further instructions.   ZIO XT- Long Term Monitor Instructions  Your physician has requested you wear a ZIO patch monitor for 14 days.  This is a single patch monitor. [employer] supplies one patch monitor per enrollment. Additional stickers are not available. Please do not apply patch if you will be having a Nuclear Stress Test,  Echocardiogram, Cardiac CT, MRI, or Chest Xray during the period you would be wearing the  monitor. The patch cannot be worn during these tests. You cannot remove and re-apply the  ZIO XT patch monitor.  Your ZIO patch monitor will be mailed 3 day USPS to your address on file. It may take 3-5 days  to receive your monitor after you have been enrolled.  Once you have received your monitor, please review the enclosed instructions. Your monitor  has already been registered assigning a specific monitor serial # to you.  Billing and Patient Assistance Program Information  We have supplied [employer] with any of your insurance information on  file for billing purposes. [employer] offers a sliding scale Patient Assistance Program for patients that do not have  insurance, or whose insurance does not completely cover the cost of the ZIO monitor.  You must apply for the Patient Assistance Program to qualify for this discounted rate.  To apply, please call [employer] at [phone], select option 4, select option 2, ask to apply for  Patient Assistance Program. Meredeth Ide will ask your household income, and how many people  are in your household. They will quote your out-of-pocket cost based on that information.  [employer] will also be able to set up a 64-month, interest-free payment plan if needed.  Applying the monitor   Shave hair from upper left chest.  Hold abrader disc by orange tab. Rub abrader in 40 strokes over the upper left chest as  indicated in your monitor instructions.  Clean area with 4 enclosed alcohol pads. Let dry.  Apply patch as indicated in monitor instructions. Patch will be placed under collarbone on left  side of chest with arrow pointing upward.  Rub patch adhesive wings for 2 minutes. Remove white label marked "1". Remove the white  label marked "2". Rub patch adhesive wings for 2 additional minutes.  While looking in a mirror, press and release button in center of patch. A small green light will  flash 3-4 times. This will be your only indicator that the monitor has been turned on.  Do not shower for the first 24 hours.  You may shower after the first 24 hours.  Press the button if you feel a symptom. You will hear a small click. Record Date, Time and  Symptom in the Patient Logbook.  When you are ready to remove the patch, follow instructions on the last 2 pages of Patient  Logbook. Stick patch monitor onto the last page of Patient Logbook.  Place Patient Logbook in the blue and white box. Use locking tab on box and tape box closed  securely. The blue and white box has prepaid postage on it. Please place it in the  mailbox as  soon as possible. Your physician should have your test results approximately 7 days after the  monitor has been mailed back to [facility].  Call [facility] Customer Care at [phone] if you have questions regarding  your ZIO XT patch monitor. Call them immediately if you see an orange light blinking on your  monitor.  If your monitor falls off in less than 4 days, contact our Monitor department at [phone].  If your monitor becomes loose or falls off after 4 days call [employer] at [phone] for  suggestions on securing your monitor    Follow-Up: At [facility], you and your health needs are our priority.  As part of our continuing mission to provide you with exceptional heart care, we have created designated Provider Care Teams.  These Care Teams include your primary Cardiologist (physician) and Advanced Practice Providers (APPs -  Physician Assistants and Nurse Practitioners) who all work together to provide you with the care you need, when you need it.  We recommend signing up for the patient portal called "MyChart".  Sign up information is provided on this After Visit Summary.  MyChart is used to connect with patients for Virtual Visits (Telemedicine).  Patients are able to view lab/test results, encounter notes, upcoming appointments, etc.  Non-urgent messages can be sent to your provider as well.   To learn more about what you can do with MyChart, go to [email or web address].    Your next appointment:   3 month(s)  The format for your next appointment:   In Person  Provider:   Eligha BridegroomMichelle Swinyer, NP         Other Instructions HOW TO TAKE YOUR BLOOD PRESSURE: Rest 5 minutes before taking your blood pressure.  Don't smoke or drink caffeinated beverages for at least 30 minutes before. Take your blood pressure before (not after) you eat. Sit comfortably with your back supported and both feet on the floor (don't cross your legs). Elevate your arm to  heart level on a table or a desk. Use the proper sized cuff. It should fit smoothly and snugly around your bare upper arm. There should be enough room to slip a fingertip under the cuff. The bottom edge of the cuff should be 1 inch above the crease of the elbow.  Blood Pressure Record Sheet To take your blood pressure, you will need a blood pressure machine. You may be prescribed one, or you can buy a blood pressure machine (blood pressure monitor) at your clinic, drug store, or online. When choosing one, look for these features: An automatic monitor that has an arm cuff. A cuff that wraps snugly, but not too tightly, around your upper arm. You should be able to fit only one finger between your arm and the cuff. A device that stores blood pressure reading results. Do not choose a monitor that measures your blood pressure from your wrist or finger. Follow your  health care provider's instructions for how to take your blood pressure. To use this form: Get one reading in the morning (a.m.) before you take any medicines. Get one reading in the evening (p.m.) before supper. Take at least two readings with each blood pressure check. This makes sure the results are correct. Wait 1-2 minutes between measurements. Write down the results in the spaces on this form. Repeat this once a week, or as told by your health care provider. Make a follow-up appointment with your health care provider to discuss the results. Blood pressure log Date: _______________________ a.m. _____________________(1st reading) _____________________(2nd reading) p.m. _____________________(1st reading) _____________________(2nd reading) Date: _______________________ a.m. _____________________(1st reading) _____________________(2nd reading) p.m. _____________________(1st reading) _____________________(2nd reading) Date: _______________________ a.m. _____________________(1st reading) _____________________(2nd reading) p.m.  _____________________(1st reading) _____________________(2nd reading) Date: _______________________ a.m. _____________________(1st reading) _____________________(2nd reading) p.m. _____________________(1st reading) _____________________(2nd reading) Date: _______________________ a.m. _____________________(1st reading) _____________________(2nd reading) p.m. _____________________(1st reading) _____________________(2nd reading) This information is not intended to replace advice given to you by your health care provider. Make sure you discuss any questions you have with your health care provider. Document Revised: 07/21/2021 Document Reviewed: 07/21/2021 Elsevier Patient Education  2023 Elsevier Inc.  Please send BP readings X 2 weeks to Eligha Bridegroom, NP either [place] or you can phone in to [phone].   Important Information About Sugar

## 2022-04-13 NOTE — Progress Notes (Unsigned)
[id number] zio xt from office inventory applied to patient.  Dr. Marlou Porch to read.

## 2022-05-02 ENCOUNTER — Other Ambulatory Visit: Payer: Self-pay | Admitting: Family Medicine

## 2022-05-02 DIAGNOSIS — R1900 Intra-abdominal and pelvic swelling, mass and lump, unspecified site: Secondary | ICD-10-CM

## 2022-05-09 ENCOUNTER — Ambulatory Visit
Admission: RE | Admit: 2022-05-09 | Discharge: 2022-05-09 | Disposition: A | Discharge status: Home / Self Care | Payer: Medicare Other | Source: Ambulatory Visit | Attending: *Deleted | Admitting: *Deleted

## 2022-05-09 DIAGNOSIS — R1900 Intra-abdominal and pelvic swelling, mass and lump, unspecified site: Secondary | ICD-10-CM

## 2022-05-29 ENCOUNTER — Emergency Department (HOSPITAL_COMMUNITY)
Admission: EM | Admit: 2022-05-29 | Discharge: 2022-05-29 | Disposition: A | Discharge status: Home / Self Care | Payer: Medicare Other | Attending: Emergency Medicine | Admitting: Emergency Medicine

## 2022-05-29 ENCOUNTER — Ambulatory Visit
Admission: EM | Admit: 2022-05-29 | Discharge: 2022-05-29 | Disposition: A | Discharge status: Home / Self Care | Payer: Medicare Other | Attending: Internal Medicine | Admitting: Internal Medicine

## 2022-05-29 DIAGNOSIS — I161 Hypertensive emergency: Secondary | ICD-10-CM

## 2022-05-29 DIAGNOSIS — R42 Dizziness and giddiness: Secondary | ICD-10-CM

## 2022-05-29 DIAGNOSIS — Z7982 Long term (current) use of aspirin: Secondary | ICD-10-CM | POA: Insufficient documentation

## 2022-05-29 DIAGNOSIS — H81399 Other peripheral vertigo, unspecified ear: Secondary | ICD-10-CM | POA: Diagnosis not present

## 2022-05-29 DIAGNOSIS — R112 Nausea with vomiting, unspecified: Secondary | ICD-10-CM | POA: Diagnosis not present

## 2022-05-29 DIAGNOSIS — H8110 Benign paroxysmal vertigo, unspecified ear: Secondary | ICD-10-CM

## 2022-05-29 MED ORDER — MECLIZINE HCL 25 MG PO TABS: 25.0000 mg | ORAL_TABLET | Freq: Three times a day (TID) | ORAL | 0 refills | Status: DC | PRN

## 2022-05-29 MED ORDER — ONDANSETRON 4 MG PO TBDP
4.0000 mg | ORAL_TABLET | Freq: Once | ORAL | Status: AC
  Administered 2022-05-29: 4 mg via ORAL

## 2022-05-29 MED ORDER — MECLIZINE HCL 25 MG PO TABS
50.0000 mg | ORAL_TABLET | Freq: Once | ORAL | Status: AC
  Administered 2022-05-29: 50 mg via ORAL
  Filled 2022-05-29: qty 2

## 2022-05-29 NOTE — ED Provider Notes (Signed)
[facility]    CSN: [id number] Arrival date & time: 05/29/22  1913      History   Chief Complaint Chief Complaint  Patient presents with   Nausea    HPI [name] [name] is a 76 y.o. female.   Patient presents with nausea, vomiting, dizziness after having tube placed in left ear at approximately 4:00  p.m. today.  Patient reports that she had localized lidocaine in left ear prior to tube being placed.  She states that it felt like the lidocaine "ran down her neck and into her chest".  She has had about 6 episodes of nonbloody emesis that started about an hour after the procedure.  Also having some dizziness.  Denies blurred vision, headache, chest pain, shortness of breath.  Patient has significantly elevated blood pressure reading as well and states that she has taken her blood pressure medication as prescribed.     Past Medical History:  Diagnosis Date   Arthritis    Bronchitis    hx   Chest pain 2009   MV with no scar or ischemia, EF 65%   Complication of anesthesia    claustrophobic !   Diabetes mellitus    GERD (gastroesophageal reflux disease)    History of cardiac catheterization    a. Myoview 2/15 with anteroapical ischemia, EF 64% (intermediate risk) >> LHC - normal cos, EF 55-60%   Hypertension    Neuromuscular disorder (HCC)    arotic Aneurysm   Obesity (BMI 35.0-39.9 without comorbidity)    Pneumonia    Stroke [facility])    with some resid L sided weakness   Thoracic ascending aortic aneurysm (HCC)    a. 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, midline 07/15/2020   Encounter for fitting and adjustment of pessary 07/15/2020   Chronic sinusitis    Complicated migraine    Pure hypercholesterolemia    TIA (transient ischemic  attack)    Chest pain    Cerebral infarction (HCC) 08/26/2016   Chronic diastolic heart failure, NYHA class 2 (HCC) 12/07/2015   Pulmonary HTN (HCC) 12/07/2015   Chronic chest pain 12/07/2015   Ascending aortic aneurysm 12/07/2015   Left sided numbness 12/07/2015   Bradycardia 05/06/2012   CVA (cerebral vascular accident) (HCC) 05/05/2012   Obesity due to excess calories 04/25/2012   Cystocele 03/22/2012   Diabetes mellitus with coincident hypertension (HCC) 03/22/2012   Essential hypertension 03/22/2012   gerd 03/22/2012    Past Surgical History:  Procedure Laterality Date   ABDOMINAL HYSTERECTOMY  2006   Partial, one ovary left    CARDIAC CATHETERIZATION  2004   [place] per pt.   CHOLECYSTECTOMY     EAR CYST EXCISION Left 09/20/2016   Procedure: EXCISION  LEFT EAR LESION;  Surgeon: Newman Pies, MD;  Location: [facility] OR;  Service: ENT;  Laterality: Left;   LEFT HEART CATH AND CORONARY ANGIOGRAPHY N/A 06/10/2021   Procedure: LEFT HEART CATH AND CORONARY ANGIOGRAPHY;  Surgeon: Tonny Bollman, MD;  Location: [facility] INVASIVE CV LAB;  Service: Cardiovascular;  Laterality: N/A;   LEFT HEART CATHETERIZATION WITH CORONARY ANGIOGRAM N/A 01/09/2014   Procedure: LEFT HEART CATHETERIZATION WITH CORONARY ANGIOGRAM;  Surgeon: Marykay Lex, MD;  Location: [facility] CATH LAB;  Service: Cardiovascular;  Laterality: N/A;   MENISCUS REPAIR Right    ROTATOR CUFF REPAIR Left    SKIN SPLIT GRAFT Left 09/20/2016   Procedure: SKIN GRAFT SPLIT THICKNESS;  Surgeon: Leta Baptist, MD;  Location: [facility] OR;  Service: ENT;  Laterality: Left;   TEMPOROMANDIBULAR JOINT SURGERY     TONSILLECTOMY     WISDOM TOOTH EXTRACTION      OB History     Gravida  4   Para  4   Term  4   Preterm  0   AB  0   Living  4      SAB  0   IAB  0   Ectopic  0   Multiple  0   Live Births           Obstetric Comments  Delivered 4 large babies vaginally          Home Medications    Prior to Admission  medications   Medication Sig Start Date End Date Taking? Authorizing Provider  acetaminophen (TYLENOL) 500 MG tablet Take 1 tablet (500 mg total) by mouth every 6 (six) hours as needed. 10/21/19   Fawze, Mina A, PA-C  albuterol (PROVENTIL HFA;VENTOLIN HFA) 108 (90 BASE) MCG/ACT inhaler Inhale 1-2 puffs into the lungs every 6 (six) hours as needed for wheezing. 07/29/13   Harden Mo, MD  aspirin EC 81 MG tablet Take 81 mg by mouth daily.    [provider]  atorvastatin (LIPITOR) 10 MG tablet Take 10 mg by mouth daily. 08/05/20   [provider]  Cholecalciferol (VITAMIN D3) 1000 units CAPS Take 1 capsule by mouth daily.    [provider]  dapagliflozin propanediol (FARXIGA) 5 MG TABS tablet Take 5 mg by mouth daily.    [provider]  fluticasone (FLONASE) 50 MCG/ACT nasal spray Place 2 sprays into both nostrils daily. 04/09/22   [provider]  glimepiride (AMARYL) 2 MG tablet Take 2 mg by mouth daily. 03/17/22   [provider]  linagliptin (TRADJENTA) 5 MG TABS tablet Take 5 mg by mouth daily.    [provider]  lisinopril (ZESTRIL) 10 MG tablet Take 1 tablet (10 mg total) by mouth daily. 07/29/21   Loel Dubonnet, NP  metoprolol succinate (TOPROL-XL) 50 MG 24 hr tablet TAKE 1 TABLET BY MOUTH DAILY WITH OR IMMEDIATELY FOLLOWING A MEAL 04/13/22   Swinyer, Lanice Schwab, NP  Multiple Vitamins-Minerals (PRESERVISION AREDS 2) CAPS Take 1 tablet by mouth 2 (two) times daily.    [provider]  omeprazole (PRILOSEC) 40 MG capsule Take 40 mg by mouth daily.    [provider]    Family History Family History  Problem Relation Age of Onset   Heart disease Father    Valvular heart disease Father        aortic valve replacement   Pancreatitis Mother    Diabetes Maternal Grandmother    Heart disease Brother    Valvular heart disease Brother        aortic valve replacement   CAD Son        heart attack   Kidney  disease Son     Social History Social History   Tobacco Use   Smoking status: Never   Smokeless tobacco: Never  Vaping Use   Vaping Use: Never used  Substance Use Topics   Alcohol use: No   Drug use: No     Allergies   Adhesive [tape], Codeine,  and Empagliflozin   Review of Systems Review of Systems Per HPI  Physical Exam Triage Vital Signs ED Triage Vitals  Enc Vitals Group     BP 05/29/22 1931 (!) 199/120     Pulse Rate 05/29/22 1931 69     Resp 05/29/22 1931 18     Temp 05/29/22 1931 98.3 F (36.8 C)     Temp src --      SpO2 05/29/22 1931 98 %     Weight --      Height --      Head Circumference --      Peak Flow --      Pain Score 05/29/22 1930 0     Pain Loc --      Pain Edu? --      Excl. in GC? --    No data found.  Updated Vital Signs BP (!) 199/120   Pulse 69   Temp 98.3 F (36.8 C)   Resp 18   SpO2 98%   Visual Acuity Right Eye Distance:   Left Eye Distance:   Bilateral Distance:    Right Eye Near:   Left Eye Near:    Bilateral Near:     Physical Exam Constitutional:      General: She is not in acute distress.    Appearance: Normal appearance. She is not toxic-appearing or diaphoretic.  HENT:     Head: Normocephalic and atraumatic.  Eyes:     Extraocular Movements: Extraocular movements intact.     Conjunctiva/sclera: Conjunctivae normal.     Pupils: Pupils are equal, round, and reactive to light.  Cardiovascular:     Rate and Rhythm: Normal rate and regular rhythm.     Pulses: Normal pulses.     Heart sounds: Normal heart sounds.  Pulmonary:     Effort: Pulmonary effort is normal. No respiratory distress.     Breath sounds: Normal breath sounds.  Neurological:     General: No focal deficit present.     Mental Status: She is alert and oriented to person, place, and time. Mental status is at baseline.     Cranial Nerves: Cranial nerves 2-12 are intact.     Sensory: Sensation is intact.     Motor: Motor function is intact.      Coordination: Coordination is intact.     Gait: Gait is intact.     Comments: Patient is mildly unsteady on feet given dizziness.  Psychiatric:        Mood and Affect: Mood normal.        Behavior: Behavior normal.        Thought Content: Thought content normal.        Judgment: Judgment normal.      UC Treatments / Results  Labs (all labs ordered are listed, but only abnormal results are displayed) Labs Reviewed - No data to display  EKG   Radiology No results found.  Procedures Procedures (including critical care time)  Medications Ordered in UC Medications  ondansetron (ZOFRAN-ODT) disintegrating tablet 4 mg (4 mg Oral Given 05/29/22 2007)    Initial Impression / Assessment and Plan / UC Course  I have reviewed the triage vital signs and the nursing notes.  Pertinent labs & imaging results that were available during my care of the patient were reviewed by me and considered in my medical decision making (see chart for details).     Patient's systolic blood pressure is almost 200.  Given possible postprocedure complications  and associated nausea, vomiting, dizziness with elevated blood pressure reading, I do think this warrants further evaluation and management  at the hospital.  Do not have resources and medications here in urgent care to help alleviate symptoms and high blood pressure.  Patient was advised that she will need to go to the hospital for further evaluation and management.  Suggested EMS transport and patient was agreeable.  Patient left via EMS transport. Final Clinical Impressions(s) / UC Diagnoses   Final diagnoses:  Hypertensive emergency  Dizziness and giddiness  Nausea and vomiting, unspecified vomiting type     Discharge Instructions      Patient sent to the hospital via EMS.    ED Prescriptions   None    PDMP not reviewed this encounter.   Teodora Medici, [place] 05/29/22 2009

## 2022-05-29 NOTE — Discharge Instructions (Signed)
Patient sent to the hospital via EMS. 

## 2022-05-29 NOTE — ED Triage Notes (Signed)
Patient presents to Urgent Care with complaints of nausea and vomiting and dizziness since 5pm. Patient reports she had a tube placed in L ear under lidocaine at ent today at 4;30 and has since felt this way.

## 2022-05-29 NOTE — ED Provider Notes (Signed)
[facility] EMERGENCY DEPARTMENT Provider Note   CSN: [id number] Arrival date & time: 05/29/22  2032     History {Add pertinent medical, surgical, social history, OB history to HPI:1} Chief Complaint  Patient presents with   Headache   Nausea   Hypertension    Lucina L Hritz is a 76 y.o. female.  HPI     Patient comes in with chief complaint of dizziness.  Home Medications Prior to Admission medications   Medication Sig Start Date End Date Taking? Authorizing Provider  meclizine (ANTIVERT) 25 MG tablet Take 1 tablet (25 mg total) by mouth 3 (three) times daily as needed for dizziness. 05/29/22  Yes Derwood Kaplan, MD  acetaminophen (TYLENOL) 500 MG tablet Take 1 tablet (500 mg total) by mouth every 6 (six) hours as needed. 10/21/19   Fawze, Mina A, PA-C  albuterol (PROVENTIL HFA;VENTOLIN HFA) 108 (90 BASE) MCG/ACT inhaler Inhale 1-2 puffs into the lungs every 6 (six) hours as needed for wheezing. 07/29/13   Reuben Likes, MD  aspirin EC 81 MG tablet Take 81 mg by mouth daily.    [provider]  atorvastatin (LIPITOR) 10 MG tablet Take 10 mg by mouth daily. 08/05/20   [provider]  Cholecalciferol (VITAMIN D3) 1000 units CAPS Take 1 capsule by mouth daily.    [provider]  dapagliflozin propanediol (FARXIGA) 5 MG TABS tablet Take 5 mg by mouth daily.    [provider]  fluticasone (FLONASE) 50 MCG/ACT nasal spray Place 2 sprays into both nostrils daily. 04/09/22   [provider]  glimepiride (AMARYL) 2 MG tablet Take 2 mg by mouth daily. 03/17/22   [provider]  linagliptin (TRADJENTA) 5 MG TABS tablet Take 5 mg by mouth daily.    [provider]  lisinopril (ZESTRIL) 10 MG tablet Take 1 tablet (10 mg total) by mouth daily. 07/29/21   Alver Sorrow, NP  metoprolol succinate (TOPROL-XL) 50 MG 24 hr tablet TAKE 1 TABLET BY MOUTH DAILY WITH OR IMMEDIATELY FOLLOWING A MEAL 04/13/22   Swinyer,  Zachary George, NP  Multiple Vitamins-Minerals (PRESERVISION AREDS 2) CAPS Take 1 tablet by mouth 2 (two) times daily.    [provider]  omeprazole (PRILOSEC) 40 MG capsule Take 40 mg by mouth daily.    [provider]      Allergies    Adhesive [tape], Codeine, and Empagliflozin    Review of Systems   Review of Systems  Physical Exam Updated Vital Signs BP (!) 163/108 (BP Location: Left Arm)   Pulse 71   Temp 97.8 F (36.6 C) (Oral)   Resp 16   SpO2 96%  Physical Exam  ED Results / Procedures / Treatments   Labs (all labs ordered are listed, but only abnormal results are displayed) Labs Reviewed - No data to display  EKG None  Radiology No results found.  Procedures Procedures  {Document cardiac monitor, telemetry assessment procedure when appropriate:1}  Medications Ordered in ED Medications  meclizine (ANTIVERT) tablet 50 mg (has no administration in time range)    ED Course/ Medical Decision Making/ A&P                           Medical Decision Making  ***  {Document critical care time when appropriate:1} {Document review of labs and clinical decision tools ie heart score, Chads2Vasc2 etc:1}  {Document your independent review of radiology images, and any outside records:1} {  Document your discussion with family members, caretakers, and with consultants:1} {Document social determinants of health affecting pt's care:1} {Document your decision making why or why not admission, treatments were needed:1} Final Clinical Impression(s) / ED Diagnoses Final diagnoses:  Benign paroxysmal positional vertigo, unspecified laterality    Rx / DC Orders ED Discharge Orders          Ordered    meclizine (ANTIVERT) 25 MG tablet  3 times daily PRN        05/29/22 2208

## 2022-05-29 NOTE — Discharge Instructions (Addendum)
We saw you in the emergency room for your dizzy sensation called vertigo.  We suspect that the vertiginous symptoms were because of the ear procedure that was performed by Dr. Suszanne Conners earlier  Take the medications are prescribed for symptom management. Prescribed are steps on your exercises called Apley maneuver, perform that exercise for about 10 minutes 4-6 times a day.

## 2022-05-29 NOTE — ED Notes (Signed)
Patient is being discharged from the Urgent Care and sent to the Emergency Department via ems . Per mound np, patient is in need of higher level of care due to bp and symptoms. Patient is aware and verbalizes understanding of plan of care.  Vitals:   05/29/22 1931  BP: (!) 199/120  Pulse: 69  Resp: 18  Temp: 98.3 F (36.8 C)  SpO2: 98%

## 2022-05-29 NOTE — ED Triage Notes (Signed)
Pt BIB EMS from urgent care for a headache, dizziness and nausea after she had tubes placed at her ENT office today at 4pm. Increase in dizziness with sitting up and standing. 10/10 pain in her head  4mg  zofran oral given at [facility] and 4mg  IV zofran with EMS   200/20 98% RA  72 NSR

## 2022-06-03 HISTORY — PX: TYMPANOSTOMY TUBE PLACEMENT: SHX32

## 2022-06-07 ENCOUNTER — Other Ambulatory Visit: Payer: Self-pay | Admitting: Family Medicine

## 2022-06-07 ENCOUNTER — Ambulatory Visit
Admission: RE | Admit: 2022-06-07 | Discharge: 2022-06-07 | Disposition: A | Discharge status: Home / Self Care | Payer: Medicare Other | Source: Ambulatory Visit | Attending: Family Medicine | Admitting: Family Medicine

## 2022-06-07 DIAGNOSIS — M5441 Lumbago with sciatica, right side: Secondary | ICD-10-CM

## 2022-06-14 ENCOUNTER — Other Ambulatory Visit: Payer: Self-pay | Admitting: Internal Medicine

## 2022-06-14 ENCOUNTER — Ambulatory Visit
Admission: RE | Admit: 2022-06-14 | Discharge: 2022-06-14 | Disposition: A | Discharge status: Home / Self Care | Payer: Medicare Other | Source: Ambulatory Visit | Attending: Internal Medicine | Admitting: Internal Medicine

## 2022-06-14 DIAGNOSIS — R52 Pain, unspecified: Secondary | ICD-10-CM

## 2022-07-03 NOTE — Progress Notes (Unsigned)
Cardiology Office Note:    Date:  07/06/2022   ID:  [name] [name], DOB 06-May-1946, MRN [id number]  PCP:  Verlon Au, MD   [facility] Providers Cardiologist:  Donato Schultz, MD     Referring MD: Verlon Au, MD   Chief Complaint: shortness of breath  History of Present Illness:    [name] [name] is a very pleasant  76 y.o. 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  She underwent left heart cath that showed widely patent coronary arteries with only minimal luminal irregularities in the LAD and circumflex.  Echocardiogram showed EF of 55 to 60%, G1 DD, mild MR.   She was last seen in our office on 12/09/2021 by Dr. Anne Fu at which time no changes were made and 1 year follow-up was advised.  She was last seen in our office by me on 04/13/22.  We received referral from PCP for evaluation, patient is unsure of exact reason she was advised to come in for sooner follow-up. Reported more chest pain recently over approximately previous 3 months but upon further discussion she is describing difficulty breathing when lying on left side and palpitations that she feels most often when lying down. Feels better when she turns on right side and elevates her head slightly. Occasionally feels palpitations during the day. Notes some shortness of breath when working in her yard. Denied edema or PND. Occasional lightheadedness for which she has slowed her movements and is careful to sit on edge of bed when getting up from sleep. No presyncope or syncope. No bleeding concerns. No regular exercise - just active in yard and at home.  Cardiac monitor revealed SR 65 bpm on average, occasional short episodes of atrial tachycardia, rare PACs and PVCs, no atrial fibrillation or pauses.   Today, she returns with her daughter for follow-up. Reports dysnea is improved and she does not have  any specific concerns at this time. Realizing she cannot do some of the things she could do previously such as moving furniture and going up and down ladders. Enjoys gardening. She denies chest pain, shortness of breath, lower extremity edema, fatigue, palpitations, melena, hematuria, hemoptysis, diaphoresis, weakness, presyncope, syncope, orthopnea, and PND. New primary care is [facility], cannot locate exact name of provider she sees but had lab work recently.   Past Medical History:  Diagnosis Date   Arthritis    Bronchitis    hx   Chest pain 2009   MV with no scar or ischemia, EF 65%   Complication of anesthesia    claustrophobic !   Diabetes mellitus    GERD (gastroesophageal reflux disease)    History of cardiac catheterization    a. Myoview 2/15 with anteroapical ischemia, EF 64% (intermediate risk) >> LHC - normal cos, EF 55-60%   Hypertension    Neuromuscular disorder (HCC)    arotic Aneurysm   Obesity (BMI 35.0-39.9 without comorbidity)    Pneumonia    Stroke [facility])    with some resid L sided weakness   Thoracic ascending aortic aneurysm (HCC)    a. CT 1/17: ascending aorta 4.2 x 4.0 cm    Past Surgical History:  Procedure Laterality Date   ABDOMINAL HYSTERECTOMY  2006   Partial, one ovary left    CARDIAC CATHETERIZATION  2004   [place] per pt.   CHOLECYSTECTOMY     EAR CYST  EXCISION Left 09/20/2016   Procedure: EXCISION  LEFT EAR LESION;  Surgeon: Newman Pies, MD;  Location: [facility] OR;  Service: ENT;  Laterality: Left;   LEFT HEART CATH AND CORONARY ANGIOGRAPHY N/A 06/10/2021   Procedure: LEFT HEART CATH AND CORONARY ANGIOGRAPHY;  Surgeon: Tonny Bollman, MD;  Location: [facility] INVASIVE CV LAB;  Service: Cardiovascular;  Laterality: N/A;   LEFT HEART CATHETERIZATION WITH CORONARY ANGIOGRAM N/A 01/09/2014   Procedure: LEFT HEART CATHETERIZATION WITH CORONARY ANGIOGRAM;  Surgeon: Marykay Lex, MD;  Location: [facility] CATH LAB;  Service: Cardiovascular;   Laterality: N/A;   MENISCUS REPAIR Right    ROTATOR CUFF REPAIR Left    SKIN SPLIT GRAFT Left 09/20/2016   Procedure: SKIN GRAFT SPLIT THICKNESS;  Surgeon: Newman Pies, MD;  Location: [facility] OR;  Service: ENT;  Laterality: Left;   TEMPOROMANDIBULAR JOINT SURGERY     TONSILLECTOMY     TYMPANOSTOMY TUBE PLACEMENT Left 06/03/2022   WISDOM TOOTH EXTRACTION      Current Medications: Current Meds  Medication Sig   acetaminophen (TYLENOL) 500 MG tablet Take 1 tablet (500 mg total) by mouth every 6 (six) hours as needed.   albuterol (PROVENTIL HFA;VENTOLIN HFA) 108 (90 BASE) MCG/ACT inhaler Inhale 1-2 puffs into the lungs every 6 (six) hours as needed for wheezing.   aspirin EC 81 MG tablet Take 81 mg by mouth daily.   atorvastatin (LIPITOR) 10 MG tablet Take 10 mg by mouth daily.   Cholecalciferol (VITAMIN D3) 1000 units CAPS Take 1 capsule by mouth daily.   dapagliflozin propanediol (FARXIGA) 5 MG TABS tablet Take 5 mg by mouth daily.   fluticasone (FLONASE) 50 MCG/ACT nasal spray Place 2 sprays into both nostrils daily.   glimepiride (AMARYL) 2 MG tablet Take 2 mg by mouth daily.   linagliptin (TRADJENTA) 5 MG TABS tablet Take 5 mg by mouth daily.   lisinopril (ZESTRIL) 10 MG tablet Take 1 tablet (10 mg total) by mouth daily.   metoprolol succinate (TOPROL-XL) 50 MG 24 hr tablet TAKE 1 TABLET BY MOUTH DAILY WITH OR IMMEDIATELY FOLLOWING A MEAL   Multiple Vitamins-Minerals (PRESERVISION AREDS 2) CAPS Take 1 tablet by mouth 2 (two) times daily.   omeprazole (PRILOSEC) 40 MG capsule Take 40 mg by mouth daily.   [DISCONTINUED] meclizine (ANTIVERT) 25 MG tablet Take 1 tablet (25 mg total) by mouth 3 (three) times daily as needed for dizziness.     Allergies:   Adhesive [tape], Codeine, and Empagliflozin   Social History   Socioeconomic History   Marital status: Widowed    Spouse name: Not on file   Number of children: Not on file   Years of education: Not on file   Highest education level: Not  on file  Occupational History   Occupation: Caregiver for the elderly    Employer: RETIRED  Tobacco Use   Smoking status: Never   Smokeless tobacco: Never  Vaping Use   Vaping Use: Never used  Substance and Sexual Activity   Alcohol use: No   Drug use: No   Sexual activity: Not Currently    Birth control/protection: Post-menopausal  Other Topics Concern   Not on file  Social History Narrative   Family History Details: Her mother died at age 10 of pancreatitis. Her father died of mitral valve disease at age 45. She has a brother who has had myocardial infarction and also has had bypass surgery and a sister who    has a pacemaker.  Social Determinants of Health   Financial Resource Strain: Not on file  Food Insecurity: Not on file  Transportation Needs: Not on file  Physical Activity: Not on file  Stress: Not on file  Social Connections: Not on file     Family History: The patient's family history includes CAD in her son; Diabetes in her maternal grandmother; Heart disease in her brother and father; Kidney disease in her son; Pancreatitis in her mother; Valvular heart disease in her brother and father.  ROS:   Please see the history of present illness.    + palpitations + dyspnea on exertion All other systems reviewed and are negative.  Labs/Other Studies Reviewed:    The following studies were reviewed today:  Cardiac monitor 6/27  Sinus rhythm 65 bpm on average Occasional short episodes of atrial tachycardia-benign. Rare PACs, rare PVCs No atrial fibrillation no pauses no adverse arrhythmias.  LHC 06/10/21  Widely patent coronary arteries with minimal irregularities in the LAD and LCx, angiographically normal left main and RCA. Normal LVEDP Tortuous right subclavian artery   Suspect non-cardiac chest pain   Echo 06/10/21   1. Compared to 02/09/20, findings are similar.   2. Left ventricular ejection fraction, by estimation, is 55 to 60%. The  left  ventricle has normal function. The left ventricle has no regional  wall motion abnormalities. Left ventricular diastolic parameters are  consistent with Grade I diastolic  dysfunction (impaired relaxation).   3. Right ventricular systolic function is normal. The right ventricular  size is normal. There is mildly elevated pulmonary artery systolic  pressure.   4. The mitral valve is normal in structure. Mild mitral valve  regurgitation. No evidence of mitral stenosis.   5. The aortic valve is tricuspid. Aortic valve regurgitation is trivial.  No aortic stenosis is present.   6. Aortic dilatation noted. There is mild dilatation of the ascending  aorta and of the aortic root, measuring 38 mm.   7. The inferior vena cava is normal in size with greater than 50%  respiratory variability, suggesting right atrial pressure of 3 mmHg.    Cardiac monitor 10/11/16  No atrial fibrillation No pauses Average heart rate 62 bpm   Reassuring monitor  Recent Labs: 08/15/2021: BUN 19; Creatinine, Ser 1.04; Potassium 3.7; Sodium 142  Recent Lipid Panel    Component Value Date/Time   CHOL 113 06/10/2021 0422   TRIG 78 06/10/2021 0422   HDL 34 (L) 06/10/2021 0422   CHOLHDL 3.3 06/10/2021 0422   VLDL 16 06/10/2021 0422   LDLCALC 63 06/10/2021 0422     Risk Assessment/Calculations:       Physical Exam:    VS:  BP 138/82   Pulse 62   Ht 5\' 3"  (1.6 m)   Wt 187 lb 12.8 oz (85.2 kg)   SpO2 98%   BMI 33.27 kg/m    Vitals:   07/06/22 0912  BP: 138/82  Pulse: 62  SpO2: 98%     Wt Readings from Last 3 Encounters:  07/06/22 187 lb 12.8 oz (85.2 kg)  04/13/22 189 lb 12.8 oz (86.1 kg)  12/09/21 186 lb (84.4 kg)     GEN:  Well nourished, well developed in no acute distress HEENT: Normal NECK: No JVD; No carotid bruits CARDIAC: RRR, no murmurs, rubs, gallops RESPIRATORY:  Clear to auscultation without rales, wheezing or rhonchi  ABDOMEN: Soft, non-tender,  non-distended MUSCULOSKELETAL:  No edema; No deformity. 2+ pedal pulses, equal bilaterally SKIN: Warm and dry NEUROLOGIC:  Alert and oriented x 3 PSYCHIATRIC:  Normal affect   EKG:  EKG is not ordered today.    Diagnoses:    1. Essential hypertension   2. Hyperlipidemia LDL goal <70   3. Aortic root dilation (HCC)   4. Dyspnea on exertion   5. Heart palpitations     Assessment and Plan:     Dyspnea: Improved. Thinks she was trying to do too much when working outside. No further concerns since she has stopped doing heavy lifting. Widely patent coronary arteries, normal LVEDP by cath July 2022.  Echo 05/2021 revealed normal LVEF 55 to 60%, G1 DD, mild MR.  Appears euvolemic. No dyspnea, orthopnea, PND, edema.  Encouraged regular physical activity such as walking.  Continue to avoid high sodium foods.  Palpitations: Cardiac monitor revealed occasional short episodes of atrial tachycardia, rare PACs and PVCs.  These are not symptomatic for her.  Advised these may be more apparent when lying on left side. Continue metoprolol.   Thoracic aneurysm without rupture: Stable measurement of 38 mm on echo 05/2021.  No symptoms concerning for rupture. We will continue to follow with imaging in 1-2 years, sooner if clinically indicated.   Hypertension: BP is better today. Went to ED on 7/10 for hypertensive emergency following a lidocaine injection. BP elevation felt to be 2/2 vertigo. She was given meclizine and BP improved. No additional episodes of significantly elevated BP per patient.  No medication changes today.  Hyperlipidemia LDL goal < 70: LDL 63 on 06/10/21. Had recent lipid panel with PCP, we have asked for results to be faxed. Encouraged regular physical activity such as walking. Continue atorvastatin.  Disposition: 6 months with Dr. Anne FuSkains   Medication Adjustments/Labs and Tests Ordered: Current medicines are reviewed at length with the patient today.  Concerns regarding medicines are  outlined above.  No orders of the defined types were placed in this encounter.  No orders of the defined types were placed in this encounter.   Patient Instructions  Medication Instructions:   Your physician recommends that you continue on your current medications as directed. Please refer to the Current Medication list given to you today.   *If you need a refill on your cardiac medications before your next appointment, please call your pharmacy*   Lab Work:  None ordered.  If you have labs (blood work) drawn today and your tests are completely normal, you will receive your results only by: MyChart Message (if you have MyChart) OR A paper copy in the mail If you have any lab test that is abnormal or we need to change your treatment, we will call you to review the results.   Testing/Procedures:  None ordered.   Follow-Up: At [facility], you and your health needs are our priority.  As part of our continuing mission to provide you with exceptional heart care, we have created designated Provider Care Teams.  These Care Teams include your primary Cardiologist (physician) and Advanced Practice Providers (APPs -  Physician Assistants and Nurse Practitioners) who all work together to provide you with the care you need, when you need it.  We recommend signing up for the patient portal called "MyChart".  Sign up information is provided on this After Visit Summary.  MyChart is used to connect with patients for Virtual Visits (Telemedicine).  Patients are able to view lab/test results, encounter notes, upcoming appointments, etc.  Non-urgent messages can be sent to your provider as well.   To learn more about what you  can do with MyChart, go to [email or web address].    Your next appointment:   6 month(s)  The format for your next appointment:   In Person  Provider:   Donato Schultz, MD     Other Instructions  Your physician wants you to follow-up in: 6 months with Dr.Skains.   You will receive a reminder letter in the mail two months in advance. If you don't receive a letter, please call our office to schedule the follow-up appointment.   Important Information About Sugar         Signed, Levi Aland, NP  07/06/2022 10:37 AM    [facility]

## 2022-07-06 ENCOUNTER — Ambulatory Visit: Payer: Medicare Other | Admitting: Nurse Practitioner

## 2022-07-06 ENCOUNTER — Encounter: Payer: Self-pay | Admitting: Nurse Practitioner

## 2022-07-06 ENCOUNTER — Telehealth: Payer: Self-pay | Admitting: *Deleted

## 2022-07-06 VITALS — BP 138/82 | HR 62 | Ht 63.0 in | Wt 187.8 lb

## 2022-07-06 DIAGNOSIS — E785 Hyperlipidemia, unspecified: Secondary | ICD-10-CM | POA: Diagnosis not present

## 2022-07-06 DIAGNOSIS — R0609 Other forms of dyspnea: Secondary | ICD-10-CM

## 2022-07-06 DIAGNOSIS — I10 Essential (primary) hypertension: Secondary | ICD-10-CM | POA: Diagnosis not present

## 2022-07-06 DIAGNOSIS — I77810 Thoracic aortic ectasia: Secondary | ICD-10-CM | POA: Diagnosis not present

## 2022-07-06 DIAGNOSIS — R002 Palpitations: Secondary | ICD-10-CM

## 2022-07-06 NOTE — Patient Instructions (Signed)
Medication Instructions:   Your physician recommends that you continue on your current medications as directed. Please refer to the Current Medication list given to you today.   *If you need a refill on your cardiac medications before your next appointment, please call your pharmacy*   Lab Work:  None ordered.  If you have labs (blood work) drawn today and your tests are completely normal, you will receive your results only by: MyChart Message (if you have MyChart) OR A paper copy in the mail If you have any lab test that is abnormal or we need to change your treatment, we will call you to review the results.   Testing/Procedures:  None ordered.   Follow-Up: At [facility], you and your health needs are our priority.  As part of our continuing mission to provide you with exceptional heart care, we have created designated Provider Care Teams.  These Care Teams include your primary Cardiologist (physician) and Advanced Practice Providers (APPs -  Physician Assistants and Nurse Practitioners) who all work together to provide you with the care you need, when you need it.  We recommend signing up for the patient portal called "MyChart".  Sign up information is provided on this After Visit Summary.  MyChart is used to connect with patients for Virtual Visits (Telemedicine).  Patients are able to view lab/test results, encounter notes, upcoming appointments, etc.  Non-urgent messages can be sent to your provider as well.   To learn more about what you can do with MyChart, go to [email or web address].    Your next appointment:   6 month(s)  The format for your next appointment:   In Person  Provider:   Donato Schultz, MD     Other Instructions  Your physician wants you to follow-up in: 6 months with Dr.Skains.  You will receive a reminder letter in the mail two months in advance. If you don't receive a letter, please call our office to schedule the follow-up  appointment.   Important Information About Sugar

## 2022-07-06 NOTE — Telephone Encounter (Signed)
S/w [facility] Dance @ [facility] @ [phone] to get July labs/lipid/bmet faxed to office.

## 2022-07-07 NOTE — Telephone Encounter (Signed)
S/w with [facility] about pt's labs.  Stated was faxed over this am @ 8:30 am.

## 2022-07-12 NOTE — Telephone Encounter (Signed)
Spoke X 3 to [facility] Jonny Ruiz).  Stated will fax over pt's recent labs. If not received call back and ask for John.

## 2022-07-12 NOTE — Telephone Encounter (Signed)
Received labs from [facility].

## 2022-08-28 ENCOUNTER — Ambulatory Visit: Payer: Medicare Other | Admitting: Podiatry

## 2022-08-28 ENCOUNTER — Encounter: Payer: Self-pay | Admitting: Podiatry

## 2022-08-28 DIAGNOSIS — M79674 Pain in right toe(s): Secondary | ICD-10-CM

## 2022-08-28 DIAGNOSIS — M2142 Flat foot [pes planus] (acquired), left foot: Secondary | ICD-10-CM

## 2022-08-28 DIAGNOSIS — I10 Essential (primary) hypertension: Secondary | ICD-10-CM

## 2022-08-28 DIAGNOSIS — M2141 Flat foot [pes planus] (acquired), right foot: Secondary | ICD-10-CM

## 2022-08-28 DIAGNOSIS — E1142 Type 2 diabetes mellitus with diabetic polyneuropathy: Secondary | ICD-10-CM

## 2022-08-28 DIAGNOSIS — M79675 Pain in left toe(s): Secondary | ICD-10-CM | POA: Diagnosis not present

## 2022-08-28 DIAGNOSIS — L84 Corns and callosities: Secondary | ICD-10-CM

## 2022-08-28 DIAGNOSIS — B351 Tinea unguium: Secondary | ICD-10-CM | POA: Diagnosis not present

## 2022-08-28 DIAGNOSIS — E119 Type 2 diabetes mellitus without complications: Secondary | ICD-10-CM

## 2022-08-28 NOTE — Progress Notes (Addendum)
  Subjective:  Patient ID: [name] [name], female    DOB: Feb 08, 1946,  MRN: [id number]  Chief Complaint  Patient presents with   Diabetes    Diabetic foot care   Callouses    Painful corns and calluses    76 y.o. female presents with the above complaint. History confirmed with patient.   Objective:  Physical Exam: warm, good capillary refill, no trophic changes or ulcerative lesions, normal DP and PT pulses, and normal sensory exam. Left Foot: dystrophic yellowed discolored nail plates with subungual debris, submetatarsal 3 and 4 callus and base of fifth metatarsal Right Foot: dystrophic yellowed discolored nail plates with subungual debris, submetatarsal 5 callus  Assessment:   1. Pain due to onychomycosis of toenails of both feet   2. Callus of foot   3. Diabetic polyneuropathy associated with type 2 diabetes mellitus ([place])   4. Pes planus of both feet      Plan:  Patient was evaluated and treated and all questions answered.  Patient educated on diabetes. Discussed proper diabetic foot care and discussed risks and complications of disease. Educated patient in depth on reasons to return to the office immediately should he/she discover anything concerning or new on the feet. All questions answered. Discussed proper shoes as well.    Discussed the etiology and treatment options for the condition in detail with the patient. Educated patient on the topical and oral treatment options for mycotic nails. Recommended debridement of the nails today. Sharp and mechanical debridement performed of all painful and mycotic nails today. Nails debrided in length and thickness using a nail nipper to level of comfort. Discussed treatment options including appropriate shoe gear. Follow up as needed for painful nails.   All symptomatic hyperkeratoses were safely debrided with a sterile #15 blade to patient's level of comfort without incident. We discussed preventative and palliative care of these  lesions including supportive and accommodative shoegear, padding, prefabricated and custom molded accommodative orthoses, use of a pumice stone and lotions/creams daily.   Return in about 3 months (around 11/28/2022) for at risk diabetic foot care.

## 2022-08-28 NOTE — Patient Instructions (Signed)
Look for urea 40% + 2% salicylic acid  cream or ointment and apply to the thickened dry skin / calluses. This can be bought over the counter, at a pharmacy or online such as [employer].  

## 2022-11-29 ENCOUNTER — Ambulatory Visit: Payer: Medicare Other

## 2022-11-29 ENCOUNTER — Ambulatory Visit (INDEPENDENT_AMBULATORY_CARE_PROVIDER_SITE_OTHER): Payer: Medicare Other | Admitting: Podiatry

## 2022-11-29 ENCOUNTER — Encounter: Payer: Self-pay | Admitting: Podiatry

## 2022-11-29 DIAGNOSIS — M79675 Pain in left toe(s): Secondary | ICD-10-CM | POA: Diagnosis not present

## 2022-11-29 DIAGNOSIS — E1142 Type 2 diabetes mellitus with diabetic polyneuropathy: Secondary | ICD-10-CM

## 2022-11-29 DIAGNOSIS — M79674 Pain in right toe(s): Secondary | ICD-10-CM | POA: Diagnosis not present

## 2022-11-29 DIAGNOSIS — B351 Tinea unguium: Secondary | ICD-10-CM

## 2022-11-29 DIAGNOSIS — M216X2 Other acquired deformities of left foot: Secondary | ICD-10-CM

## 2022-11-29 NOTE — Progress Notes (Addendum)
This patient returns to my office for at risk foot care.  This patient requires this care by a professional since this patient will be at risk due to having diabetic neuropathy.  This patient is unable to cut nails herself since the patient cannot reach her nails.These nails are painful walking and wearing shoes.  This patient presents for at risk foot care today.  General Appearance  Alert, conversant and in no acute stress.  Vascular  Dorsalis pedis and posterior tibial  pulses are palpable  bilaterally.  Capillary return is within normal limits  bilaterally. Temperature is within normal limits  bilaterally.  Neurologic  Senn-Weinstein monofilament wire test diminished  bilaterally. Muscle power within normal limits bilaterally.  Nails Thick disfigured discolored nails with subungual debris  from hallux to fifth toes bilaterally. No evidence of bacterial infection or drainage bilaterally.  Orthopedic  No limitations of motion  feet .  No crepitus or effusions noted.  Plantar flexed third and fourth mets left foot.  Skin  normotropic skin with no porokeratosis noted bilaterally.  No signs of infections or ulcers noted.  Skin callus sub 5th metabase left foot.   Onychomycosis  Pain in right toes  Pain in left toes  Consent was obtained for treatment procedures.   Mechanical debridement of nails 1-5  bilaterally performed with a nail nipper.  Filed with dremel without incident. Patient is measured for diabetic shoes.   Return office visit   3 months                   Told patient to return for periodic foot care and evaluation due to potential at risk complications.   Gardiner Barefoot DPM

## 2022-11-29 NOTE — Addendum Note (Signed)
Addended by: Gardiner Barefoot on: 11/29/2022 03:00 PM   Modules accepted: Orders

## 2022-12-05 NOTE — Addendum Note (Signed)
Addended bySherryle Lis, Shamarcus Hoheisel R on: 12/05/2022 07:17 AM   Modules accepted: Orders

## 2023-01-10 ENCOUNTER — Other Ambulatory Visit: Payer: Self-pay | Admitting: Family Medicine

## 2023-01-10 DIAGNOSIS — Z13820 Encounter for screening for osteoporosis: Secondary | ICD-10-CM

## 2023-01-22 ENCOUNTER — Encounter: Payer: Self-pay | Admitting: Physician Assistant

## 2023-01-22 ENCOUNTER — Ambulatory Visit: Payer: Medicare Other | Attending: Physician Assistant | Admitting: Physician Assistant

## 2023-01-22 VITALS — BP 168/99 | HR 65 | Ht 63.0 in | Wt 189.0 lb

## 2023-01-22 DIAGNOSIS — R002 Palpitations: Secondary | ICD-10-CM | POA: Diagnosis not present

## 2023-01-22 DIAGNOSIS — R0609 Other forms of dyspnea: Secondary | ICD-10-CM | POA: Diagnosis not present

## 2023-01-22 DIAGNOSIS — I7121 Aneurysm of the ascending aorta, without rupture: Secondary | ICD-10-CM

## 2023-01-22 DIAGNOSIS — E785 Hyperlipidemia, unspecified: Secondary | ICD-10-CM | POA: Diagnosis not present

## 2023-01-22 DIAGNOSIS — I10 Essential (primary) hypertension: Secondary | ICD-10-CM

## 2023-01-22 NOTE — Patient Instructions (Addendum)
Medication Instructions:    Your physician recommends that you continue on your current medications as directed. Please refer to the Current Medication list given to you today.   *If you need a refill on your cardiac medications before your next appointment, please call your pharmacy*   Lab Work:  PLEASE GET LABS  DRAWN AT YOUR PRIMARY  PROVIDER OFFICE  CHOLESTEROL/LIVER  AND CMP   FAX RESULTS TO : [phone] [facility] [place]    If you have labs (blood work) drawn today and your tests are completely normal, you will receive your results only by: [place] (if you have MyChart) OR A paper copy in the mail If you have any lab test that is abnormal or we need to change your treatment, we will call you to review the results.   Testing/Procedures:  NONE ORDERED  TODAY     Follow-Up: At [facility], you and your health needs are our priority.  As part of our continuing mission to provide you with exceptional heart care, we have created designated Provider Care Teams.  These Care Teams include your primary Cardiologist (physician) and Advanced Practice Providers (APPs -  Physician Assistants and Nurse Practitioners) who all work together to provide you with the care you need, when you need it.  We recommend signing up for the patient portal called "MyChart".  Sign up information is provided on this After Visit Summary.  MyChart is used to connect with patients for Virtual Visits (Telemedicine).  Patients are able to view lab/test results, encounter notes, upcoming appointments, etc.  Non-urgent messages can be sent to your provider as well.   To learn more about what you can do with MyChart, go to [email or web address].    Your next appointment:    3 -4 month(s)  Provider:   Candee Furbish, MD  Johann Capers    Other Instructions    PLEASE MAKE YOU TAKE BLOOD PRESSURE  ONE  HOUR AFTER  MEDICINES   FOR TWO WEEKS  UPLOAD TO [place] OR CALL AND LEAVE MESSAGE WITH INFORMATION  COLLECTED.

## 2023-01-22 NOTE — Progress Notes (Signed)
Office Visit    Patient Name: [name] [name] Date of Encounter: 01/22/2023  PCP:  Layla Barter, [place]  Cardiologist:  Candee Furbish, MD  Advanced Practice Provider:  No care team member to display Electrophysiologist:  None   HPI    [name] [name] is a 77 y.o. female with a past medical history of dilated ascending aorta, prior stroke with residual left-sided weakness, diabetes, and hyperlipidemia presents today for follow-up visit.  She was admitted 05/2021 with chest pain.  Underwent left heart catheterization that showed widely patent coronary arteries with only minimal luminal irregularities in the LAD and circumflex.  Echo showed EF 55 to 60%, G1 DD, mild MR.  She was last seen 12/09/2021 by Dr. Marlou Porch at which time no changes were made and 1 year follow-up was advised.  She was last seen in our office 04/13/2022 and received referral from PCP for evaluation.  The patient at the time was unsure of the exact reason but was advised to come in for sooner follow-up.  Reported more chest pain recently over approximately 3 months but upon further discussion was describing more difficulty breathing when laying on left side and palpitations that she felt most often when laying down.  Felt better when she turned on her right side and elevated her head slightly.  Occasionally felt palpitations during the day.  Some shortness of breath when working in her yard.  Occasional lightheadedness.  No syncope or presyncope.  Cardiac monitor revealed sinus rhythm 65 bpm on average, occasional short episode of atrial tachycardia, rare PAC/PVC, no atrial fibrillation or pauses.  She was seen in follow-up 07/06/2022 and at that time she reported dyspnea had improved.  She did not have any specific concerns.  Realizing that she cannot do some things that she previously could do such as moving furniture or going up and down ladders.  Enjoyed gardening.  Denied any cardiac  symptoms.  Today, she states that she is under a lot of stress because her son is sick and his wife is not treating him well.  She keeps the family in the dark about a lot medical decisions.  Blood pressure is elevated today and was fine a few months ago.  She does get some fluttering in her chest when she gets really upset.  She does not sleep well at night because she constantly is worrying.  We discussed how the stress is affecting her health and her blood pressure.  I did suggest reaching out to Raquel Sarna to see if she could provide some resources to help remedy the situation.  Reports no shortness of breath nor dyspnea on exertion. Reports no chest pain, pressure, or tightness. No edema, orthopnea, PND. Reports no palpitations.     Past Medical History    Past Medical History:  Diagnosis Date   Arthritis    Bronchitis    hx   Chest pain 2009   MV with no scar or ischemia, EF [id number]   Complication of anesthesia    claustrophobic !   Diabetes mellitus    GERD (gastroesophageal reflux disease)    History of cardiac catheterization    a. Myoview 2/15 with anteroapical ischemia, EF 64% (intermediate risk) >> LHC - normal cos, EF 55-60%   Hypertension    Neuromuscular disorder (HCC)    arotic Aneurysm   Obesity (BMI 35.0-39.9 without comorbidity)    Pneumonia    Stroke [facility])    with  some resid L sided weakness   Thoracic ascending aortic aneurysm ([place])    a. CT 1/17: ascending aorta 4.2 x 4.0 cm   Past Surgical History:  Procedure Laterality Date   ABDOMINAL HYSTERECTOMY  2006   Partial, one ovary left    CARDIAC CATHETERIZATION  2004   [place], [place].; [place] per pt.   CHOLECYSTECTOMY     EAR CYST EXCISION Left 09/20/2016   Procedure: EXCISION  LEFT EAR LESION;  Surgeon: Leta Baptist, MD;  Location: [facility];  Service: ENT;  Laterality: Left;   LEFT HEART CATH AND CORONARY ANGIOGRAPHY N/A 06/10/2021   Procedure: LEFT HEART CATH AND CORONARY ANGIOGRAPHY;  Surgeon: Sherren Mocha,  MD;  Location: [facility];  Service: Cardiovascular;  Laterality: N/A;   LEFT HEART CATHETERIZATION WITH CORONARY ANGIOGRAM N/A 01/09/2014   Procedure: LEFT HEART CATHETERIZATION WITH CORONARY ANGIOGRAM;  Surgeon: Leonie Man, MD;  Location: [facility] CATH LAB;  Service: Cardiovascular;  Laterality: N/A;   MENISCUS REPAIR Right    ROTATOR CUFF REPAIR Left    SKIN SPLIT GRAFT Left 09/20/2016   Procedure: SKIN GRAFT SPLIT THICKNESS;  Surgeon: Leta Baptist, MD;  Location: [facility] OR;  Service: ENT;  Laterality: Left;   TEMPOROMANDIBULAR JOINT SURGERY     TONSILLECTOMY     TYMPANOSTOMY TUBE PLACEMENT Left 06/03/2022   WISDOM TOOTH EXTRACTION      Allergies  Allergies  Allergen Reactions   Adhesive [Tape] Rash    pls use elastic wraps instead of adhesive tape   Codeine Nausea And Vomiting   Empagliflozin Other (See Comments)    Headaches & lower back pain    EKGs/Labs/Other Studies Reviewed:   The following studies were reviewed today: Cardiac monitor 6/27   Sinus rhythm 65 bpm on average Occasional short episodes of atrial tachycardia-benign. Rare PACs, rare PVCs No atrial fibrillation no pauses no adverse arrhythmias.   LHC 06/10/21   Widely patent coronary arteries with minimal irregularities in the LAD and LCx, angiographically normal left main and RCA. Normal LVEDP Tortuous right subclavian artery   Suspect non-cardiac chest pain     Echo 06/10/21    1. Compared to 02/09/20, findings are similar.   2. Left ventricular ejection fraction, by estimation, is 55 to 60%. The  left ventricle has normal function. The left ventricle has no regional  wall motion abnormalities. Left ventricular diastolic parameters are  consistent with Grade I diastolic  dysfunction (impaired relaxation).   3. Right ventricular systolic function is normal. The right ventricular  size is normal. There is mildly elevated pulmonary artery systolic  pressure.   4. The mitral valve is normal in  structure. Mild mitral valve  regurgitation. No evidence of mitral stenosis.   5. The aortic valve is tricuspid. Aortic valve regurgitation is trivial.  No aortic stenosis is present.   6. Aortic dilatation noted. There is mild dilatation of the ascending  aorta and of the aortic root, measuring 38 mm.   7. The inferior vena cava is normal in size with greater than 50%  respiratory variability, suggesting right atrial pressure of 3 mmHg.    Cardiac monitor 10/11/16   No atrial fibrillation No pauses Average heart rate 62 bpm   Reassuring monitor  EKG:  EKG is not ordered today.    Recent Labs: No results found for requested labs within last 365 days.  Recent Lipid Panel    Component Value Date/Time   CHOL 113 06/10/2021 0422   TRIG 78 06/10/2021 0422  HDL 34 (L) 06/10/2021 0422   CHOLHDL 3.3 06/10/2021 0422   VLDL 16 06/10/2021 0422   LDLCALC 63 06/10/2021 0422    Home Medications   Current Meds  Medication Sig   acetaminophen (TYLENOL) 500 MG tablet Take 1 tablet (500 mg total) by mouth every 6 (six) hours as needed.   albuterol (PROVENTIL HFA;VENTOLIN HFA) 108 (90 BASE) MCG/ACT inhaler Inhale 1-2 puffs into the lungs every 6 (six) hours as needed for wheezing.   aspirin EC 81 MG tablet Take 81 mg by mouth daily.   atorvastatin (LIPITOR) 10 MG tablet Take 10 mg by mouth daily.   Cholecalciferol (VITAMIN D3) 1000 units CAPS Take 1 capsule by mouth daily.   dapagliflozin propanediol (FARXIGA) 5 MG TABS tablet Take 5 mg by mouth daily.   fluticasone (FLONASE) 50 MCG/ACT nasal spray Place 2 sprays into both nostrils as needed for allergies.   glimepiride (AMARYL) 2 MG tablet Take 2 mg by mouth daily.   linagliptin (TRADJENTA) 5 MG TABS tablet Take 5 mg by mouth daily.   lisinopril (ZESTRIL) 10 MG tablet Take 1 tablet by mouth daily.   metoprolol succinate (TOPROL-XL) 50 MG 24 hr tablet TAKE 1 TABLET BY MOUTH DAILY WITH OR IMMEDIATELY FOLLOWING A MEAL   Multiple  Vitamins-Minerals (PRESERVISION AREDS 2) CAPS Take 1 tablet by mouth 2 (two) times daily.   nystatin cream (MYCOSTATIN) daily at 6 (six) AM.   omeprazole (PRILOSEC) 40 MG capsule Take 1 capsule by mouth daily.   triamcinolone ointment (KENALOG) 0.5 % Apply topically daily at 6 (six) AM.     Review of Systems      All other systems reviewed and are otherwise negative except as noted above.  Physical Exam    VS:  BP (!) 168/99   Pulse 65   Ht '5\' 3"'$  (1.6 m)   Wt 189 lb (85.7 kg)   SpO2 98%   BMI 33.48 kg/m  , BMI Body mass index is 33.48 kg/m.  Wt Readings from Last 3 Encounters:  01/22/23 189 lb (85.7 kg)  07/06/22 187 lb 12.8 oz (85.2 kg)  04/13/22 189 lb 12.8 oz (86.1 kg)     GEN: Well nourished, well developed, in no acute distress. HEENT: normal. Neck: Supple, no JVD, carotid bruits, or masses. Cardiac: RRR, no murmurs, rubs, or gallops. No clubbing, cyanosis, edema.  Radials/PT 2+ and equal bilaterally.  Respiratory:  Respirations regular and unlabored, clear to auscultation bilaterally. GI: Soft, nontender, nondistended. MS: No deformity or atrophy. Skin: Warm and dry, no rash. Neuro:  Strength and sensation are intact. Psych: Normal affect.  Assessment & Plan    Essential hypertension -174/90 -168/99-repeat -Would continue to take lisinopril 10 mg daily and metoprolol succinate 50 mg daily -We discussed her checking her blood pressure at home and if it remains over [id number] systolic several days in a row that we would increase her lisinopril -We need to check a BMP but she sees her primary in the next few weeks.  Would recommend checking then  Hyperlipidemia, goal less than 70 -Continue Lipitor 10 mg daily -She is due for lipid panel LFTs which she can get with her primary in a few weeks -We will need a copy of those labs  Aortic  root dilation  -Mild, 38 mm -Extremely important to monitor blood pressure closely  Heart palpitations  -Only occurs when she is  upset -Certainly if they become more frequent could consider a heart monitor in the future  HYPERTENSION CONTROL Vitals:   01/22/23 1552 01/22/23 1737  BP: (!) 174/90 (!) 168/99    The patient's blood pressure is elevated above target today.  In order to address the patient's elevated BP: Blood pressure will be monitored at home to determine if medication changes need to be made.         Disposition: Follow up 3-4 months with Candee Furbish, MD or APP.  Signed, Elgie Collard, PA-C 01/22/2023, 5:37 PM [facility]

## 2023-01-23 ENCOUNTER — Telehealth (HOSPITAL_COMMUNITY): Payer: Self-pay | Admitting: Licensed Clinical Social Worker

## 2023-01-23 NOTE — Telephone Encounter (Signed)
H&V Care Navigation CSW Progress Note  Clinical Social Worker consulted to speak with pt about stressors that are affecting her BP.  CSW spoke with pt who reports main source of stress right now is her son's health.  States he has multiple chronic health concerns which are affecting him.  Pt reports she goes to medical appts with him instead of his wife- this frustrates her because she feels as if his wife is not being supportive of him.  Also feels as if she is not providing proper caregiving as she doesn't think her son is getting food made for him on a regular basis which he needs to take with his medications.  Son does not appear to be dependent on wife for car and still drives for patient but patient still feels responsible for his caregiving needs and this has made her worry constantly and added to her increased BP.    Pt has strong support from her sister who she speaks with daily but misses her husband who has passed away and was her biggest source of support.  Reports having an episode a few years ago and working with a mental health provider at that time- feels as if that was helpful in getting back on track and is agreeable to CSW placing referral to [facility]- referral placed.   SDOH Screenings   Depression (PHQ2-9): Low Risk  (07/15/2020)  Stress: Stress Concern Present (01/23/2023)  Tobacco Use: Low Risk  (01/22/2023)      Jorge Ny, LCSW Clinical Social Worker [facility] Desk#: [phone] Cell#: [phone]

## 2023-01-28 ENCOUNTER — Ambulatory Visit
Admission: RE | Admit: 2023-01-28 | Discharge: 2023-01-28 | Disposition: A | Discharge status: Home / Self Care | Payer: Medicare Other | Source: Ambulatory Visit | Attending: Internal Medicine | Admitting: Internal Medicine

## 2023-01-28 VITALS — BP 184/117 | HR 74 | Temp 97.5°F | Resp 16

## 2023-01-28 DIAGNOSIS — J069 Acute upper respiratory infection, unspecified: Secondary | ICD-10-CM | POA: Diagnosis not present

## 2023-01-28 DIAGNOSIS — H9202 Otalgia, left ear: Secondary | ICD-10-CM | POA: Diagnosis not present

## 2023-01-28 MED ORDER — OFLOXACIN 0.3 % OT SOLN: 10.0000 [drp] | Freq: Two times a day (BID) | OTIC | 0 refills | Status: DC

## 2023-01-28 MED ORDER — BENZONATATE 100 MG PO CAPS
100.0000 mg | ORAL_CAPSULE | Freq: Three times a day (TID) | ORAL | 0 refills | Status: DC | PRN
Start: 2023-01-28 — End: 2023-05-10

## 2023-01-28 NOTE — ED Triage Notes (Addendum)
Pt c/o left ear pain x 7 days that radiates into her neck. Pt states she saw her PCP on 01/25/23 and was given an injection she can't remember what it was. She was also given Amoxicillin.

## 2023-01-28 NOTE — ED Provider Notes (Addendum)
[facility]    CSN: [id number] Arrival date & time: 01/28/23  0841      History   Chief Complaint Chief Complaint  Patient presents with   Ear Drainage    Having neck pain - Entered by patient   Otalgia    HPI [name] [name] is a 77 y.o. female.   Patient presents with left ear pain that has been present for about 7 days.  Reports the ear pain is starting to radiate down her neck.  She reports that she has had some associated nasal congestion and nonproductive cough as well.  She saw her PCP on 01/25/2023 and was prescribed amoxicillin.  She has been taking this as prescribed with minimal improvement.  Reports that she has had some drainage from the left ear.  Her family member who is present in exam room helps provide history.  Family member reports that she had ear tubes placed in the left ear by ENT about 1 year ago.  Denies any associated fever or known sick contacts.  Patient not reporting chest pain or shortness of breath.  Denies history of asthma or COPD.  Patient has elevated blood pressure reading but reports that she has not taken her medication today.  Although, she does report that her blood pressure has been in the [id number] systolic over the past week or so.  She mentioned this to her PCP at recent visit who advised to monitor blood pressure closely and follow-up if it remains elevated.  She also saw cardiology recently who was also advised of elevated blood pressure.  Cardiology advised her to take her blood pressure medication, then check 1 hour later.  She states that cardiology wants her to send blood pressure readings to her and then they will reevaluate.  Patient not reporting headache, dizziness, blurred vision.   Ear Drainage  Otalgia   Past Medical History:  Diagnosis Date   Arthritis    Bronchitis    hx   Chest pain 2009   MV with no scar or ischemia, EF [id number]   Complication of anesthesia    claustrophobic !   Diabetes mellitus    GERD  (gastroesophageal reflux disease)    History of cardiac catheterization    a. Myoview 2/15 with anteroapical ischemia, EF 64% (intermediate risk) >> LHC - normal cos, EF 55-60%   Hypertension    Neuromuscular disorder (HCC)    arotic Aneurysm   Obesity (BMI 35.0-39.9 without comorbidity)    Pneumonia    Stroke [name])    with some resid L sided weakness   Thoracic ascending aortic aneurysm ([place])    a. CT 1/17: ascending aorta 4.2 x 4.0 cm    Patient Active Problem List   Diagnosis Date Noted   Plantar flexed metatarsal bone of left foot 11/29/2022   History of stroke 12/09/2021   Aortic root dilation ([place]) 06/10/2021   Hypokalemia 06/10/2021   Unstable angina ([place]) 06/09/2021   Diabetic polyneuropathy associated with type 2 diabetes mellitus ([place]) 01/11/2021   Pelvic relaxation due to cystocele, midline 07/15/2020   Encounter for fitting and adjustment of pessary 07/15/2020   Nontraumatic complete tear of left rotator cuff 01/22/2020   Chronic sinusitis    Complicated migraine    Pure hypercholesterolemia    TIA (transient ischemic attack)    Chest pain    Cerebral infarction ([place]) 08/26/2016   Osteopenia 08/25/2016   Chronic diastolic heart failure, NYHA class 2 ([place]) 12/07/2015   Pulmonary HTN ([place])  12/07/2015   Chronic chest pain 12/07/2015   Ascending aortic aneurysm 12/07/2015   Left sided numbness 12/07/2015   Breast density 11/30/2015   DDD (degenerative disc disease), lumbar 07/07/2015   Bradycardia 05/06/2012   CVA (cerebral vascular accident) ([place]) 05/05/2012   Obesity due to excess calories 04/25/2012   Cystocele 03/22/2012   Diabetes mellitus with coincident hypertension ([place]) 03/22/2012   Essential hypertension 03/22/2012   gerd 03/22/2012    Past Surgical History:  Procedure Laterality Date   ABDOMINAL HYSTERECTOMY  2006   Partial, one ovary left    CARDIAC CATHETERIZATION  2004   [place], [place].; [place] per pt.   CHOLECYSTECTOMY     EAR CYST EXCISION  Left 09/20/2016   Procedure: EXCISION  LEFT EAR LESION;  Surgeon: Leta Baptist, MD;  Location: [place] OR;  Service: ENT;  Laterality: Left;   LEFT HEART CATH AND CORONARY ANGIOGRAPHY N/A 06/10/2021   Procedure: LEFT HEART CATH AND CORONARY ANGIOGRAPHY;  Surgeon: Sherren Mocha, MD;  Location: [place] [facility] LAB;  Service: Cardiovascular;  Laterality: N/A;   LEFT HEART CATHETERIZATION WITH CORONARY ANGIOGRAM N/A 01/09/2014   Procedure: LEFT HEART CATHETERIZATION WITH CORONARY ANGIOGRAM;  Surgeon: Leonie Man, MD;  Location: [facility] CATH LAB;  Service: Cardiovascular;  Laterality: N/A;   MENISCUS REPAIR Right    ROTATOR CUFF REPAIR Left    SKIN SPLIT GRAFT Left 09/20/2016   Procedure: SKIN GRAFT SPLIT THICKNESS;  Surgeon: Leta Baptist, MD;  Location: [facility];  Service: ENT;  Laterality: Left;   TEMPOROMANDIBULAR JOINT SURGERY     TONSILLECTOMY     TYMPANOSTOMY TUBE PLACEMENT Left 06/03/2022   WISDOM TOOTH EXTRACTION      OB History     Gravida  4   Para  4   Term  4   Preterm  0   AB  0   Living  4      SAB  0   IAB  0   Ectopic  0   Multiple  0   Live Births           Obstetric Comments  Delivered 4 large babies vaginally          Home Medications    Prior to Admission medications   Medication Sig Start Date End Date Taking? Authorizing Provider  benzonatate (TESSALON) 100 MG capsule Take 1 capsule (100 mg total) by mouth every 8 (eight) hours as needed for cough. 01/28/23  Yes Ngan Qualls, Hildred Alamin E, FNP  ofloxacin (FLOXIN) 0.3 % OTIC solution Place 10 drops into the left ear 2 (two) times daily for 14 days. 01/28/23 02/11/23 Yes Marilynn Ekstein, Michele Rockers, FNP  acetaminophen (TYLENOL) 500 MG tablet Take 1 tablet (500 mg total) by mouth every 6 (six) hours as needed. 10/21/19   Fawze, Mina A, PA-C  albuterol (PROVENTIL HFA;VENTOLIN HFA) 108 (90 BASE) MCG/ACT inhaler Inhale 1-2 puffs into the lungs every 6 (six) hours as needed for wheezing. 07/29/13   Harden Mo, MD  aspirin EC 81 MG tablet  Take 81 mg by mouth daily.    [provider]  atorvastatin (LIPITOR) 10 MG tablet Take 10 mg by mouth daily. 08/05/20   [provider]  Cholecalciferol (VITAMIN D3) 1000 units CAPS Take 1 capsule by mouth daily.    [provider]  dapagliflozin propanediol (FARXIGA) 5 MG TABS tablet Take 5 mg by mouth daily.    [provider]  fluticasone (FLONASE) 50 MCG/ACT nasal spray Place 2 sprays into both  nostrils as needed for allergies. 04/09/22   [provider]  glimepiride (AMARYL) 2 MG tablet Take 2 mg by mouth daily. 03/17/22   [provider]  linagliptin (TRADJENTA) 5 MG TABS tablet Take 5 mg by mouth daily.    [provider]  lisinopril (ZESTRIL) 10 MG tablet Take 1 tablet by mouth daily. 07/25/22   [provider]  metoprolol succinate (TOPROL-XL) 50 MG 24 hr tablet TAKE 1 TABLET BY MOUTH DAILY WITH OR IMMEDIATELY FOLLOWING A MEAL 04/13/22   Swinyer, Lanice Schwab, NP  Multiple Vitamins-Minerals (PRESERVISION AREDS 2) CAPS Take 1 tablet by mouth 2 (two) times daily.    [provider]  nystatin cream (MYCOSTATIN) daily at 6 (six) AM. 09/20/22   [provider]  omeprazole (PRILOSEC) 40 MG capsule Take 1 capsule by mouth daily. 08/09/22   [provider]  triamcinolone ointment (KENALOG) 0.5 % Apply topically daily at 6 (six) AM. 06/21/21   [provider]    Family History Family History  Problem Relation Age of Onset   Heart disease Father    Valvular heart disease Father        aortic valve replacement   Pancreatitis Mother    Diabetes Maternal Grandmother    Heart disease Brother    Valvular heart disease Brother        aortic valve replacement   CAD Son        heart attack   Kidney disease Son     Social History Social History   Tobacco Use   Smoking status: Never   Smokeless tobacco: Never  Vaping Use   Vaping Use: Never used  Substance Use Topics   Alcohol use: No   Drug  use: No     Allergies   Adhesive [tape], Codeine, and Empagliflozin   Review of Systems Review of Systems Per HPI  Physical Exam Triage Vital Signs ED Triage Vitals  Enc Vitals Group     BP 01/28/23 0902 (!) 184/117     Pulse Rate 01/28/23 0902 74     Resp 01/28/23 0902 16     Temp 01/28/23 0902 (!) 97.5 F (36.4 C)     Temp Source 01/28/23 0902 Oral     SpO2 01/28/23 0902 95 %     Weight --      Height --      Head Circumference --      Peak Flow --      Pain Score 01/28/23 0858 10     Pain Loc --      Pain Edu? --      Excl. in [place]? --    No data found.  Updated Vital Signs BP (!) 184/117 (BP Location: Right Arm) Comment: Did not take BP medication today  Pulse 74   Temp (!) 97.5 F (36.4 C) (Oral)   Resp 16   SpO2 95%   Visual Acuity Right Eye Distance:   Left Eye Distance:   Bilateral Distance:    Right Eye Near:   Left Eye Near:    Bilateral Near:     Physical Exam Constitutional:      General: She is not in acute distress.    Appearance: Normal appearance. She is not toxic-appearing or diaphoretic.  HENT:     Head: Normocephalic and atraumatic.     Right Ear: Ear canal normal. A middle ear effusion is present. Tympanic membrane is not perforated, erythematous or bulging.     Left Ear:  Ear canal normal. A middle ear effusion is present. A PE tube is present. Tympanic membrane is not perforated, erythematous or bulging.     Nose: Congestion present.     Mouth/Throat:     Mouth: Mucous membranes are moist.     Pharynx: No posterior oropharyngeal erythema.  Eyes:     Extraocular Movements: Extraocular movements intact.     Conjunctiva/sclera: Conjunctivae normal.     Pupils: Pupils are equal, round, and reactive to light.  Cardiovascular:     Rate and Rhythm: Normal rate and regular rhythm.     Pulses: Normal pulses.     Heart sounds: Normal heart sounds.  Pulmonary:     Effort: Pulmonary effort is normal. No respiratory distress.     Breath  sounds: Normal breath sounds. No stridor. No wheezing, rhonchi or rales.  Abdominal:     General: Abdomen is flat. Bowel sounds are normal.     Palpations: Abdomen is soft.  Musculoskeletal:        General: Normal range of motion.     Cervical back: Normal range of motion.  Lymphadenopathy:     Cervical: No cervical adenopathy.  Skin:    General: Skin is warm and dry.  Neurological:     General: No focal deficit present.     Mental Status: She is alert and oriented to person, place, and time. Mental status is at baseline.  Psychiatric:        Mood and Affect: Mood normal.        Behavior: Behavior normal.      UC Treatments / Results  Labs (all labs ordered are listed, but only abnormal results are displayed) Labs Reviewed - No data to display  EKG   Radiology No results found.  Procedures Procedures (including critical care time)  Medications Ordered in UC Medications - No data to display  Initial Impression / Assessment and Plan / UC Course  I have reviewed the triage vital signs and the nursing notes.  Pertinent labs & imaging results that were available during my care of the patient were reviewed by me and considered in my medical decision making (see chart for details).     Patient presents with symptoms likely from a viral upper respiratory infection.  Do not suspect underlying cardiopulmonary process. Symptoms seem unlikely related to ACS, CHF or COPD exacerbations, pneumonia, pneumothorax. Patient is nontoxic appearing and not in need of emergent medical intervention.  Viral testing deferred given duration of symptoms as it would not change treatment.  There are no adventitious lung sounds on exam or signs of respiratory compromise so do not think that chest imaging is necessary.  Patient appears to have TM tubes placed in left ear.  She is having some drainage but no obvious otitis media.  Will treat with ofloxacin eardrops and have patient continue amoxicillin  antibiotic.  Advised it will be beneficial to follow-up with ENT specialty as well given tube is in place.  Attempted to place ambulatory referral to ENT.  Although, advised patient to follow-up with provided contact information for ENT specialty if she does not hear from them in 48 to 72 hours.  Recommended symptom control with medications and supportive care as well.  Blood pressure is elevated.  She has not taken her blood pressure medication today.  Advised to go home and immediately take her blood pressure medication.  Patient reports blood pressure has been significantly elevated over the past 2 weeks and PCP and cardiology are  aware.  She reports that she has plan in place for follow-up with cardiology and blood pressure management.  Therefore, given patient is asymptomatic regarding blood pressure and has plan for follow-up do not think that emergent evaluation is necessary.  Advised strict return and ER precautions regarding blood pressure.  Patient states understanding and is agreeable.   Final Clinical Impressions(s) / UC Diagnoses   Final diagnoses:  Viral upper respiratory tract infection with cough  Acute otalgia, left     Discharge Instructions      I have prescribed eardrops for your left ear.  I have also prescribed a cough medication to take as needed.  A referral has been made to ear, nose, throat specialist.  If you do not hear from them in 48 hours, please call provided contact information and follow-up with PCP to get official referral.  Please monitor blood pressure very closely at home and follow-up with cardiology or PCP if it remains elevated.    ED Prescriptions     Medication Sig Dispense Auth. Provider   ofloxacin (FLOXIN) 0.3 % OTIC solution Place 10 drops into the left ear 2 (two) times daily for 14 days. 5 mL Kade Rickels, Hildred Alamin E, [place]   benzonatate (TESSALON) 100 MG capsule Take 1 capsule (100 mg total) by mouth every 8 (eight) hours as needed for cough. 21  capsule [place], Michele Rockers, [place]      PDMP not reviewed this encounter.   Teodora Medici, [place] 01/28/23 [place], [place], [place] 01/28/23 1059

## 2023-01-28 NOTE — Discharge Instructions (Addendum)
I have prescribed eardrops for your left ear.  I have also prescribed a cough medication to take as needed.  A referral has been made to ear, nose, throat specialist.  If you do not hear from them in 48 hours, please call provided contact information and follow-up with PCP to get official referral.  Please monitor blood pressure very closely at home and follow-up with cardiology or PCP if it remains elevated.

## 2023-01-31 ENCOUNTER — Inpatient Hospital Stay (HOSPITAL_COMMUNITY): Payer: Medicare Other

## 2023-01-31 ENCOUNTER — Encounter (HOSPITAL_COMMUNITY): Payer: Self-pay

## 2023-01-31 ENCOUNTER — Other Ambulatory Visit: Payer: Self-pay

## 2023-01-31 ENCOUNTER — Inpatient Hospital Stay (HOSPITAL_COMMUNITY)
Admission: EM | Admit: 2023-01-31 | Discharge: 2023-02-02 | DRG: 153 | Disposition: A | Discharge status: Home / Self Care | Payer: Medicare Other | Attending: Family Medicine | Admitting: Family Medicine

## 2023-01-31 ENCOUNTER — Emergency Department (HOSPITAL_COMMUNITY): Payer: Medicare Other

## 2023-01-31 DIAGNOSIS — K219 Gastro-esophageal reflux disease without esophagitis: Secondary | ICD-10-CM | POA: Diagnosis present

## 2023-01-31 DIAGNOSIS — E119 Type 2 diabetes mellitus without complications: Secondary | ICD-10-CM

## 2023-01-31 DIAGNOSIS — Z8673 Personal history of transient ischemic attack (TIA), and cerebral infarction without residual deficits: Secondary | ICD-10-CM

## 2023-01-31 DIAGNOSIS — Z7982 Long term (current) use of aspirin: Secondary | ICD-10-CM

## 2023-01-31 DIAGNOSIS — I10 Essential (primary) hypertension: Secondary | ICD-10-CM | POA: Diagnosis present

## 2023-01-31 DIAGNOSIS — J320 Chronic maxillary sinusitis: Secondary | ICD-10-CM | POA: Diagnosis present

## 2023-01-31 DIAGNOSIS — Z90710 Acquired absence of both cervix and uterus: Secondary | ICD-10-CM | POA: Diagnosis not present

## 2023-01-31 DIAGNOSIS — E876 Hypokalemia: Secondary | ICD-10-CM | POA: Diagnosis present

## 2023-01-31 DIAGNOSIS — Z7984 Long term (current) use of oral hypoglycemic drugs: Secondary | ICD-10-CM

## 2023-01-31 DIAGNOSIS — I110 Hypertensive heart disease with heart failure: Secondary | ICD-10-CM | POA: Diagnosis present

## 2023-01-31 DIAGNOSIS — F40240 Claustrophobia: Secondary | ICD-10-CM | POA: Diagnosis present

## 2023-01-31 DIAGNOSIS — E6609 Other obesity due to excess calories: Secondary | ICD-10-CM | POA: Diagnosis present

## 2023-01-31 DIAGNOSIS — Z885 Allergy status to narcotic agent status: Secondary | ICD-10-CM | POA: Diagnosis not present

## 2023-01-31 DIAGNOSIS — H70092 Acute mastoiditis with other complications, left ear: Principal | ICD-10-CM

## 2023-01-31 DIAGNOSIS — E7800 Pure hypercholesterolemia, unspecified: Secondary | ICD-10-CM | POA: Diagnosis present

## 2023-01-31 DIAGNOSIS — Z833 Family history of diabetes mellitus: Secondary | ICD-10-CM | POA: Diagnosis not present

## 2023-01-31 DIAGNOSIS — Z79899 Other long term (current) drug therapy: Secondary | ICD-10-CM

## 2023-01-31 DIAGNOSIS — H70002 Acute mastoiditis without complications, left ear: Principal | ICD-10-CM | POA: Diagnosis present

## 2023-01-31 DIAGNOSIS — Z8249 Family history of ischemic heart disease and other diseases of the circulatory system: Secondary | ICD-10-CM | POA: Diagnosis not present

## 2023-01-31 DIAGNOSIS — Z1152 Encounter for screening for COVID-19: Secondary | ICD-10-CM

## 2023-01-31 DIAGNOSIS — I5032 Chronic diastolic (congestive) heart failure: Secondary | ICD-10-CM | POA: Diagnosis present

## 2023-01-31 DIAGNOSIS — N179 Acute kidney failure, unspecified: Secondary | ICD-10-CM | POA: Diagnosis present

## 2023-01-31 DIAGNOSIS — Z6834 Body mass index (BMI) 34.0-34.9, adult: Secondary | ICD-10-CM | POA: Diagnosis not present

## 2023-01-31 DIAGNOSIS — Z888 Allergy status to other drugs, medicaments and biological substances status: Secondary | ICD-10-CM

## 2023-01-31 LAB — CBC WITH DIFFERENTIAL/PLATELET
Abs Immature Granulocytes: 0.02 10*3/uL (ref 0.00–0.07)
Basophils Absolute: 0 10*3/uL (ref 0.0–0.1)
Basophils Relative: 1 %
Eosinophils Absolute: 0.1 10*3/uL (ref 0.0–0.5)
Eosinophils Relative: 1 %
HCT: 44.9 % (ref 36.0–46.0)
Hemoglobin: 14.3 g/dL (ref 12.0–15.0)
Immature Granulocytes: 0 %
Lymphocytes Relative: 27 %
Lymphs Abs: 2 10*3/uL (ref 0.7–4.0)
MCH: 29.3 pg (ref 26.0–34.0)
MCHC: 31.8 g/dL (ref 30.0–36.0)
MCV: 92 fL (ref 80.0–100.0)
Monocytes Absolute: 0.4 10*3/uL (ref 0.1–1.0)
Monocytes Relative: 6 %
Neutro Abs: 4.9 10*3/uL (ref 1.7–7.7)
Neutrophils Relative %: 65 %
Platelets: 297 10*3/uL (ref 150–400)
RBC: 4.88 MIL/uL (ref 3.87–5.11)
RDW: 14.5 % (ref 11.5–15.5)
WBC: 7.5 10*3/uL (ref 4.0–10.5)
nRBC: 0 % (ref 0.0–0.2)

## 2023-01-31 LAB — COMPREHENSIVE METABOLIC PANEL
ALT: 11 U/L (ref 0–44)
AST: 12 U/L — ABNORMAL LOW (ref 15–41)
Albumin: 3.5 g/dL (ref 3.5–5.0)
Alkaline Phosphatase: 128 U/L — ABNORMAL HIGH (ref 38–126)
Anion gap: 9 (ref 5–15)
BUN: 14 mg/dL (ref 8–23)
CO2: 24 mmol/L (ref 22–32)
Calcium: 9.1 mg/dL (ref 8.9–10.3)
Chloride: 106 mmol/L (ref 98–111)
Creatinine, Ser: 0.96 mg/dL (ref 0.44–1.00)
GFR, Estimated: >60 mL/min (ref >60)
Glucose, Bld: 131 mg/dL — ABNORMAL HIGH (ref 70–99)
Potassium: 3.3 mmol/L — ABNORMAL LOW (ref 3.5–5.1)
Sodium: 139 mmol/L (ref 135–145)
Total Bilirubin: 0.3 mg/dL (ref 0.3–1.2)
Total Protein: 7.5 g/dL (ref 6.5–8.1)

## 2023-01-31 LAB — GLUCOSE, CAPILLARY
Glucose-Capillary: 116 mg/dL — ABNORMAL HIGH (ref 70–99)
Glucose-Capillary: 219 mg/dL — ABNORMAL HIGH (ref 70–99)

## 2023-01-31 LAB — MAGNESIUM: Magnesium: 1.9 mg/dL (ref 1.7–2.4)

## 2023-01-31 MED ORDER — PIPERACILLIN-TAZOBACTAM 3.375 G IVPB
3.3750 g | Freq: Three times a day (TID) | INTRAVENOUS | Status: DC
  Administered 2023-01-31 – 2023-02-02 (×6): 3.375 g via INTRAVENOUS
  Filled 2023-01-31 (×6): qty 50

## 2023-01-31 MED ORDER — LABETALOL HCL 5 MG/ML IV SOLN
5.0000 mg | INTRAVENOUS | Status: DC | PRN
  Administered 2023-01-31: 5 mg via INTRAVENOUS
  Filled 2023-01-31: qty 4

## 2023-01-31 MED ORDER — OXYCODONE HCL 5 MG PO TABS
5.0000 mg | ORAL_TABLET | Freq: Once | ORAL | Status: AC
  Administered 2023-01-31: 5 mg via ORAL
  Filled 2023-01-31: qty 1

## 2023-01-31 MED ORDER — SODIUM CHLORIDE 0.9% FLUSH
3.0000 mL | Freq: Two times a day (BID) | INTRAVENOUS | Status: DC
  Administered 2023-01-31: 3 mL via INTRAVENOUS

## 2023-01-31 MED ORDER — POTASSIUM CHLORIDE 20 MEQ PO PACK
40.0000 meq | PACK | Freq: Once | ORAL | Status: AC
  Administered 2023-01-31: 40 meq via ORAL
  Filled 2023-01-31: qty 2

## 2023-01-31 MED ORDER — VANCOMYCIN HCL 1500 MG/300ML IV SOLN
1500.0000 mg | INTRAVENOUS | Status: DC
  Administered 2023-01-31: 1500 mg via INTRAVENOUS
  Filled 2023-01-31: qty 300

## 2023-01-31 MED ORDER — PANTOPRAZOLE SODIUM 40 MG PO TBEC
40.0000 mg | DELAYED_RELEASE_TABLET | Freq: Every day | ORAL | Status: DC
  Administered 2023-02-01 – 2023-02-02 (×2): 40 mg via ORAL
  Filled 2023-01-31 (×2): qty 1

## 2023-01-31 MED ORDER — ACETAMINOPHEN 650 MG RE SUPP: 650.0000 mg | Freq: Four times a day (QID) | RECTAL | Status: DC | PRN

## 2023-01-31 MED ORDER — IOHEXOL 350 MG/ML SOLN
75.0000 mL | Freq: Once | INTRAVENOUS | Status: AC | PRN
  Administered 2023-01-31: 75 mL via INTRAVENOUS

## 2023-01-31 MED ORDER — ATORVASTATIN CALCIUM 10 MG PO TABS
10.0000 mg | ORAL_TABLET | Freq: Every day | ORAL | Status: DC
  Administered 2023-02-01 – 2023-02-02 (×2): 10 mg via ORAL
  Filled 2023-01-31 (×2): qty 1

## 2023-01-31 MED ORDER — KETOROLAC TROMETHAMINE 30 MG/ML IJ SOLN
30.0000 mg | Freq: Four times a day (QID) | INTRAMUSCULAR | Status: DC | PRN
  Administered 2023-01-31: 30 mg via INTRAVENOUS
  Filled 2023-01-31: qty 1

## 2023-01-31 MED ORDER — ASPIRIN 81 MG PO TBEC
81.0000 mg | DELAYED_RELEASE_TABLET | Freq: Every day | ORAL | Status: DC
  Administered 2023-02-01 – 2023-02-02 (×2): 81 mg via ORAL
  Filled 2023-01-31 (×2): qty 1

## 2023-01-31 MED ORDER — INSULIN ASPART 100 UNIT/ML IJ SOLN
0.0000 [IU] | INTRAMUSCULAR | Status: DC
  Administered 2023-01-31 – 2023-02-01 (×3): 1 [IU] via SUBCUTANEOUS
  Administered 2023-02-01: 3 [IU] via SUBCUTANEOUS

## 2023-01-31 MED ORDER — ACETAMINOPHEN 325 MG PO TABS: 650.0000 mg | ORAL_TABLET | Freq: Four times a day (QID) | ORAL | Status: DC | PRN

## 2023-01-31 MED ORDER — ONDANSETRON 4 MG PO TBDP
4.0000 mg | ORAL_TABLET | Freq: Once | ORAL | Status: AC
  Administered 2023-01-31: 4 mg via ORAL
  Filled 2023-01-31: qty 1

## 2023-01-31 MED ORDER — POLYETHYLENE GLYCOL 3350 17 G PO PACK: 17.0000 g | PACK | Freq: Every day | ORAL | Status: DC | PRN

## 2023-01-31 MED ORDER — LISINOPRIL 10 MG PO TABS
10.0000 mg | ORAL_TABLET | Freq: Every day | ORAL | Status: DC
  Administered 2023-02-01: 10 mg via ORAL
  Filled 2023-01-31: qty 1

## 2023-01-31 MED ORDER — METOPROLOL SUCCINATE ER 25 MG PO TB24
50.0000 mg | ORAL_TABLET | Freq: Every day | ORAL | Status: DC
  Administered 2023-02-01 – 2023-02-02 (×2): 50 mg via ORAL
  Filled 2023-01-31 (×2): qty 2

## 2023-01-31 NOTE — ED Notes (Signed)
ED TO INPATIENT HANDOFF REPORT  ED Nurse Name and Phone #: [phone]   S Name/Age/Gender [name] [name] 77 y.o. female Room/Bed: H016C/H016C  Code Status   Code Status: Full Code  Home/SNF/Other Home Patient oriented to: self, place, time, and situation Is this baseline? Yes   Triage Complete: Triage complete  Chief Complaint Mastoid abscess, left [H70.002]  Triage Note Reports left ear pain that radiates down her neck and into her jaw.  Reports "feel like my throat is closing".  No trouble breathing or handling secretions.  Reports it hurts to open her jaw due to her ear.  Reports was see by PCP and UC on antibiotics and ear drops.     Allergies Allergies  Allergen Reactions   Adhesive [Tape] Rash    pls use elastic wraps instead of adhesive tape   Codeine Nausea And Vomiting   Empagliflozin Other (See Comments)    Headaches & lower back pain    Level of Care/Admitting Diagnosis ED Disposition     ED Disposition  Admit   Condition  --   Comment  Hospital Area: [place] [[id number]]  Level of Care: Telemetry Surgical [105]  May admit patient to [facility] or [facility] if equivalent level of care is available:: No  Covid Evaluation: Asymptomatic - no recent exposure (last 10 days) testing not required  Diagnosis: Mastoid abscess, left [id number]  Admitting Physician: Marcelyn Bruins [id number]  Attending Physician: Marcelyn Bruins [id number]  Certification:: I certify this patient will need inpatient services for at least 2 midnights  Estimated Length of Stay: 2          B Medical/Surgery History Past Medical History:  Diagnosis Date   Arthritis    Bronchitis    hx   Chest pain 2009   MV with no scar or ischemia, EF 123456   Complication of anesthesia    claustrophobic !   CVA (cerebral vascular accident) ([place]) 05/05/2012   Diabetes mellitus    GERD (gastroesophageal reflux disease)    History of cardiac catheterization     a. Myoview 2/15 with anteroapical ischemia, EF 64% (intermediate risk) >> LHC - normal cos, EF 55-60%   Hypertension    Hypokalemia 06/10/2021   Neuromuscular disorder ([place])    arotic Aneurysm   Obesity (BMI 35.0-39.9 without comorbidity)    Pneumonia    Stroke [facility])    with some resid L sided weakness   Thoracic ascending aortic aneurysm ([place])    a. CT 1/17: ascending aorta 4.2 x 4.0 cm   Past Surgical History:  Procedure Laterality Date   ABDOMINAL HYSTERECTOMY  2006   Partial, one ovary left    CARDIAC CATHETERIZATION  2004   [place], [place].; [place] per pt.   CHOLECYSTECTOMY     EAR CYST EXCISION Left 09/20/2016   Procedure: EXCISION  LEFT EAR LESION;  Surgeon: Leta Baptist, MD;  Location: [place] OR;  Service: ENT;  Laterality: Left;   LEFT HEART CATH AND CORONARY ANGIOGRAPHY N/A 06/10/2021   Procedure: LEFT HEART CATH AND CORONARY ANGIOGRAPHY;  Surgeon: Sherren Mocha, MD;  Location: [place] [facility] LAB;  Service: Cardiovascular;  Laterality: N/A;   LEFT HEART CATHETERIZATION WITH CORONARY ANGIOGRAM N/A 01/09/2014   Procedure: LEFT HEART CATHETERIZATION WITH CORONARY ANGIOGRAM;  Surgeon: Leonie Man, MD;  Location: [facility] CATH LAB;  Service: Cardiovascular;  Laterality: N/A;   MENISCUS REPAIR Right    ROTATOR CUFF REPAIR Left    SKIN SPLIT GRAFT Left  09/20/2016   Procedure: SKIN GRAFT SPLIT THICKNESS;  Surgeon: Leta Baptist, MD;  Location[facility];  Service: ENT;  Laterality: Left;   TEMPOROMANDIBULAR JOINT SURGERY     TONSILLECTOMY     TYMPANOSTOMY TUBE PLACEMENT Left 06/03/2022   WISDOM TOOTH EXTRACTION       A IV Location/Drains/Wounds Patient Lines/Drains/Airways Status     Active Line/Drains/Airways     Name Placement date Placement time Site Days   Peripheral IV 01/31/23 20 G Anterior;Left Forearm 01/31/23  1550  Forearm  less than 1            Intake/Output Last 24 hours No intake or output data in the 24 hours ending 01/31/23 1610  Labs/Imaging Results for orders placed  or performed during the hospital encounter of 01/31/23 (from the past 48 hour(s))  CBC with Differential     Status: None   Collection Time: 01/31/23 12:36 PM  Result Value Ref Range   WBC 7.5 4.0 - 10.5 K/uL   RBC 4.88 3.87 - 5.11 MIL/uL   Hemoglobin 14.3 12.0 - 15.0 g/dL   HCT 44.9 36.0 - 46.0 %   MCV 92.0 80.0 - 100.0 fL   MCH 29.3 26.0 - 34.0 pg   MCHC 31.8 30.0 - 36.0 g/dL   RDW 14.5 11.5 - 15.5 %   Platelets 297 150 - 400 K/uL   nRBC 0.0 0.0 - 0.2 %   Neutrophils Relative % 65 %   Neutro Abs 4.9 1.7 - 7.7 K/uL   Lymphocytes Relative 27 %   Lymphs Abs 2.0 0.7 - 4.0 K/uL   Monocytes Relative 6 %   Monocytes Absolute 0.4 0.1 - 1.0 K/uL   Eosinophils Relative 1 %   Eosinophils Absolute 0.1 0.0 - 0.5 K/uL   Basophils Relative 1 %   Basophils Absolute 0.0 0.0 - 0.1 K/uL   Immature Granulocytes 0 %   Abs Immature Granulocytes 0.02 0.00 - 0.07 K/uL    Comment: Performed at [facility], [address] [place], [place] [address]  Comprehensive metabolic panel     Status: Abnormal   Collection Time: 01/31/23 12:36 PM  Result Value Ref Range   Sodium 139 135 - 145 mmol/L   Potassium 3.3 (L) 3.5 - 5.1 mmol/L   Chloride 106 98 - 111 mmol/L   CO2 24 22 - 32 mmol/L   Glucose, Bld 131 (H) 70 - 99 mg/dL    Comment: Glucose reference range applies only to samples taken after fasting for at least 8 hours.   BUN 14 8 - 23 mg/dL   Creatinine, Ser 0.96 0.44 - 1.00 mg/dL   Calcium 9.1 8.9 - 10.3 mg/dL   Total Protein 7.5 6.5 - 8.1 g/dL   Albumin 3.5 3.5 - 5.0 g/dL   AST 12 (L) 15 - 41 U/L   ALT 11 0 - 44 U/L   Alkaline Phosphatase 128 (H) 38 - 126 U/L   Total Bilirubin 0.3 0.3 - 1.2 mg/dL   GFR, Estimated >60 >60 mL/min    Comment: (NOTE) Calculated using the CKD-EPI Creatinine Equation (2021)    Anion gap 9 5 - 15    Comment: Performed at [facility] [address], [place], [place] [address]   CT Maxillofacial Wo Contrast  Result Date: 01/31/2023 CLINICAL DATA:   mastoiditis EXAM: CT MAXILLOFACIAL WITHOUT CONTRAST TECHNIQUE: Multidetector CT imaging of the maxillofacial structures was performed. Multiplanar CT image reconstructions were also generated. RADIATION DOSE REDUCTION: This  exam was performed according to the departmental dose-optimization program which includes automated exposure control, adjustment of the mA and/or kV according to patient size and/or use of iterative reconstruction technique. COMPARISON:  None Available. FINDINGS: Osseous: No fracture or mandibular dislocation. No destructive process. Orbits: Negative. No traumatic or inflammatory finding. Sinuses: Air-fluid level and partial opacification in the right maxillary sinus with areas of internal mineralization and surrounding bony osteitis. Minimal fluid in the inferior left maxillary sinus and mild ethmoid air cell mucosal thickening. Remaining sinuses are largely clear. Soft tissues: Small (11 x 2 x 5 mm) peripherally enhancing fluid collection superficial to the left mastoid air cells (see series 3, image 62 and series 9, image 83). Mild surrounding edema. Other: Moderate left mastoid effusion. Bony mastoid septa appear intact. Limited intracranial: No significant or unexpected finding. IMPRESSION: 1. Moderate left mastoid effusion, compatible with reported mastoiditis. Small adjacent abscess (11 mm) along the superficial aspect of the mastoid air cells, compatible with abscess ( "Bezold" abcess). 2. 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 Electronically Signed   By: Margaretha Sheffield M.D.   On: 01/31/2023 13:39    Pending Labs Unresulted Labs (From admission, onward)     Start     Ordered   02/01/23 0500  Comprehensive metabolic panel  Tomorrow morning,   R        01/31/23 1606   02/01/23 0500  CBC  Tomorrow morning,   R        01/31/23 1606   02/01/23 0500  Type and screen [place]  Once,   R       Comments: [place]     01/31/23 1606   01/31/23 1606  Magnesium  Add-on,   AD        01/31/23 1606            Vitals/Pain Today's Vitals   01/31/23 1223 01/31/23 1224 01/31/23 1438 01/31/23 1541  BP:   (!) 208/101   Pulse:   70   Resp:   16   Temp:  98.4 F (36.9 C) 98.5 F (36.9 C)   TempSrc:  Oral    SpO2:   100%   Weight: 189 lb (85.7 kg)     Height: '5\' 3"'$  (1.6 m)     PainSc: 10-Worst pain ever   10-Worst pain ever    Isolation Precautions No active isolations  Medications Medications  piperacillin-tazobactam (ZOSYN) IVPB 3.375 g (3.375 g Intravenous New Bag/Given 01/31/23 1552)  vancomycin (VANCOREADY) IVPB 1500 mg/300 mL (has no administration in time range)  atorvastatin (LIPITOR) tablet 10 mg (has no administration in time range)  aspirin EC tablet 81 mg (has no administration in time range)  pantoprazole (PROTONIX) EC tablet 40 mg (has no administration in time range)  sodium chloride flush (NS) 0.9 % injection 3 mL (has no administration in time range)  acetaminophen (TYLENOL) tablet 650 mg (has no administration in time range)    Or  acetaminophen (TYLENOL) suppository 650 mg (has no administration in time range)  polyethylene glycol (MIRALAX / GLYCOLAX) packet 17 g (has no administration in time range)  metoprolol succinate (TOPROL-XL) 24 hr tablet 50 mg (has no administration in time range)  lisinopril (ZESTRIL) tablet 10 mg (has no administration in time range)  potassium chloride (KLOR-CON) packet 40 mEq (has no administration in time range)  labetalol (NORMODYNE) injection 5 mg (has no administration in time range)  ketorolac (TORADOL) 30 MG/ML injection 30 mg (has  no administration in time range)  oxyCODONE (Oxy IR/ROXICODONE) immediate release tablet 5 mg (5 mg Oral Given 01/31/23 1539)  ondansetron (ZOFRAN-ODT) disintegrating tablet 4 mg (4 mg Oral Given 01/31/23 1539)    Mobility walks with device     Focused Assessments ENT left ear pain drainage mastoid tenderness  and neck pain    R Recommendations: See Admitting Provider Note  Report given to:   Additional Notes: [id number]

## 2023-01-31 NOTE — H&P (Addendum)
History and Physical   [name] [name] FA:[id number] DOB: November 24, 1945 DOA: 01/31/2023  PCP: Layla Barter, MD   Patient coming from: Home  Chief Complaint: Ear and jaw pain  HPI: [name] [name] is a 77 y.o. female with medical history significant of TIA, CVA, hyperlipidemia, hypertension, obesity, bradycardia, GERD, diabetes, migraine, diastolic CHF, ascending aortic aneurysm presenting with ongoing ear and jaw pain.  Patient reports 10 days of left ear pain that radiates to her jaw and neck now.  Reports pain with opening jaw, pain with head turning (is able to lean head forward some before it bothers her, is more painful with twisting), pain with lying flat.  Also reports some congestion and cough.  Has had ongoing issues with elevated blood pressure for the past week or so as well.  She reports some sensation of throat closing.  But, is able to swallow and no shortness of breath.  She went to see her PCP on 3/7 and was prescribed amoxicillin.  She followed up with urgent care on 3/10 and was continued on amoxicillin with addition of ofloxacin drops with return precautions.  Minimal improvement/worsening and presented today for further evaluation.  Additionally reports that she had some TM tubes placed about a year ago.  She denies fevers, chills, chest pain, shortness of breath, abdominal pain, constipation, diarrhea, nausea, vomiting.  ED Course: Vital signs in the ED notable for blood pressure in the 123456 to [id number] systolic.  Lab workup included CMP with potassium 3.3, glucose 131, alk phos 128.  CBC within normal limits.  CT maxillofacial study showed moderate left mastoid effusion consistent with mastoiditis and small adjacent left mastoid abscess.  Findings consistent with chronic right maxillary sinusitis also noted.  Patient asleep oxycodone, vancomycin, Zosyn in the ED.  ENT was consulted and Dr. Janace Hoard stateds that he will see the patient this evening.  Review of Systems: As per  HPI otherwise all other systems reviewed and are negative.  Past Medical History:  Diagnosis Date   Arthritis    Bronchitis    hx   Chest pain 2009   MV with no scar or ischemia, EF 123456   Complication of anesthesia    claustrophobic !   CVA (cerebral vascular accident) ([place]) 05/05/2012   Diabetes mellitus    GERD (gastroesophageal reflux disease)    History of cardiac catheterization    a. 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 CT 1/17: ascending aorta 4.2 x 4.0 cm    Past Surgical History:  Procedure Laterality Date   ABDOMINAL HYSTERECTOMY  2006   Partial, one ovary left    CARDIAC CATHETERIZATION  2004   [place], [place].; [place] per pt.   CHOLECYSTECTOMY     EAR CYST EXCISION Left 09/20/2016   Procedure: EXCISION  LEFT EAR LESION;  Surgeon: Leta Baptist, MD;  Location: [facility] OR;  Service: ENT;  Laterality: Left;   LEFT HEART CATH AND CORONARY ANGIOGRAPHY N/A 06/10/2021   Procedure: LEFT HEART CATH AND CORONARY ANGIOGRAPHY;  Surgeon: Sherren Mocha, MD;  Location: [facility];  Service: Cardiovascular;  Laterality: N/A;   LEFT HEART CATHETERIZATION  WITH CORONARY ANGIOGRAM N/A 01/09/2014   Procedure: LEFT HEART CATHETERIZATION WITH CORONARY ANGIOGRAM;  Surgeon: Leonie Man, MD;  Location: [facility] CATH LAB;  Service: Cardiovascular;  Laterality: N/A;   MENISCUS REPAIR Right    ROTATOR CUFF REPAIR Left    SKIN SPLIT GRAFT Left 09/20/2016   Procedure: SKIN GRAFT SPLIT THICKNESS;  Surgeon: Leta Baptist, MD;  Location: [facility] OR;  Service: ENT;  Laterality: Left;   TEMPOROMANDIBULAR JOINT SURGERY     TONSILLECTOMY     TYMPANOSTOMY TUBE PLACEMENT Left 06/03/2022   WISDOM TOOTH  EXTRACTION      Social History  reports that she has never smoked. She has never used smokeless tobacco. She reports that she does not drink alcohol and does not use drugs.  Allergies  Allergen Reactions   Adhesive [Tape] Rash    pls use elastic wraps instead of adhesive tape   Codeine Nausea And Vomiting   Empagliflozin Other (See Comments)    Headaches & lower back pain    Family History  Problem Relation Age of Onset   Heart disease Father    Valvular heart disease Father        aortic valve replacement   Pancreatitis Mother    Diabetes Maternal Grandmother    Heart disease Brother    Valvular heart disease Brother        aortic valve replacement   CAD Son        heart attack   Kidney disease Son   Reviewed on admission  Prior to Admission medications   Medication Sig Start Date End Date Taking? Authorizing Provider  acetaminophen (TYLENOL) 500 MG tablet Take 1 tablet (500 mg total) by mouth every 6 (six) hours as needed. 10/21/19   Fawze, Mina A, PA-C  albuterol (PROVENTIL HFA;VENTOLIN HFA) 108 (90 BASE) MCG/ACT inhaler Inhale 1-2 puffs into the lungs every 6 (six) hours as needed for wheezing. 07/29/13   Harden Mo, MD  aspirin EC 81 MG tablet Take 81 mg by mouth daily.    [provider]  atorvastatin (LIPITOR) 10 MG tablet Take 10 mg by mouth daily. 08/05/20   [provider]  benzonatate (TESSALON) 100 MG capsule Take 1 capsule (100 mg total) by mouth every 8 (eight) hours as needed for cough. 01/28/23   Teodora Medici, FNP  Cholecalciferol (VITAMIN D3) 1000 units CAPS Take 1 capsule by mouth daily.    [provider]  dapagliflozin propanediol (FARXIGA) 5 MG TABS tablet Take 5 mg by mouth daily.    [provider]  fluticasone (FLONASE) 50 MCG/ACT nasal spray Place 2 sprays into both nostrils as needed for allergies. 04/09/22   [provider]  glimepiride (AMARYL) 2 MG tablet Take 2 mg by mouth daily. 03/17/22   [provider]  linagliptin (TRADJENTA) 5 MG TABS tablet Take 5 mg by mouth daily.    [provider]  lisinopril (ZESTRIL) 10 MG tablet Take 1 tablet by mouth daily. 07/25/22   [provider]  metoprolol succinate (TOPROL-XL) 50 MG 24 hr tablet TAKE 1 TABLET BY MOUTH DAILY WITH OR IMMEDIATELY FOLLOWING A MEAL 04/13/22   Swinyer, Lanice Schwab, NP  Multiple Vitamins-Minerals (PRESERVISION AREDS 2) CAPS Take 1 tablet by mouth 2 (two) times daily.    [provider]  nystatin cream (MYCOSTATIN) daily at 6 (six) AM. 09/20/22   [provider]  ofloxacin (FLOXIN) 0.3 % OTIC solution Place 10 drops into the left ear 2 (two) times  daily for 14 days. 01/28/23 02/11/23  Teodora Medici, FNP  omeprazole (PRILOSEC) 40 MG capsule Take 1 capsule by mouth daily. 08/09/22   [provider]  triamcinolone ointment (KENALOG) 0.5 % Apply topically daily at 6 (six) AM. 06/21/21   [provider]    Physical Exam: Vitals:   01/31/23 1204 01/31/23 1223 01/31/23 1224 01/31/23 1438  BP: (!) 211/102   (!) 208/101  Pulse: 76   70  Resp: (!) 24   16  Temp:   98.4 F (36.9 C) 98.5 F (36.9 C)  TempSrc:   Oral   SpO2: 100%   100%  Weight:  85.7 kg    Height:  '5\' 3"'$  (1.6 m)      Physical Exam Constitutional:      General: She is not in acute distress.    Appearance: Normal appearance.  HENT:     Head: Normocephalic and atraumatic.     Comments: Pain with manipulation of left year or movement of jaw    Mouth/Throat:     Mouth: Mucous membranes are moist.     Pharynx: Oropharynx is clear.  Eyes:     Extraocular Movements: Extraocular movements intact.     Pupils: Pupils are equal, round, and reactive to light.  Cardiovascular:     Rate and Rhythm: Normal rate and regular rhythm.     Pulses: Normal pulses.     Heart sounds: Normal heart sounds.  Pulmonary:     Effort: Pulmonary effort is normal. No respiratory distress.     Breath sounds: Normal breath sounds.   Abdominal:     General: Bowel sounds are normal. There is no distension.     Palpations: Abdomen is soft.     Tenderness: There is no abdominal tenderness.  Musculoskeletal:        General: No swelling or deformity.  Skin:    General: Skin is warm and dry.  Neurological:     General: No focal deficit present.     Mental Status: Mental status is at baseline.     Labs on Admission: I have personally reviewed following labs and imaging studies  CBC: Recent Labs  Lab 01/31/23 1236  WBC 7.5  NEUTROABS 4.9  HGB 14.3  HCT 44.9  MCV 92.0  PLT 123XX123    Basic Metabolic Panel: Recent Labs  Lab 01/31/23 1236  NA 139  K 3.3*  CL 106  CO2 24  GLUCOSE 131*  BUN 14  CREATININE 0.96  CALCIUM 9.1    GFR: Estimated Creatinine Clearance: 50.9 mL/min (by C-G formula based on SCr of 0.96 mg/dL).  Liver Function Tests: Recent Labs  Lab 01/31/23 1236  AST 12*  ALT 11  ALKPHOS 128*  BILITOT 0.3  PROT 7.5  ALBUMIN 3.5    Urine analysis:    Component Value Date/Time   COLORURINE YELLOW 05/02/2020 1520   APPEARANCEUR CLEAR 05/02/2020 1520   LABSPEC 1.008 05/02/2020 1520   PHURINE 5.0 05/02/2020 1520   GLUCOSEU NEGATIVE 05/02/2020 1520   HGBUR NEGATIVE 05/02/2020 1520   BILIRUBINUR NEGATIVE 05/02/2020 1520   KETONESUR NEGATIVE 05/02/2020 1520   PROTEINUR NEGATIVE 05/02/2020 1520   UROBILINOGEN 1.0 03/07/2015 1720   NITRITE NEGATIVE 05/02/2020 1520   LEUKOCYTESUR NEGATIVE 05/02/2020 1520    Radiological Exams on Admission: CT Maxillofacial Wo Contrast  Result Date: 01/31/2023 CLINICAL DATA:  mastoiditis EXAM: CT MAXILLOFACIAL WITHOUT CONTRAST TECHNIQUE: Multidetector CT imaging of the maxillofacial structures was performed. Multiplanar CT image reconstructions were also  generated. RADIATION DOSE REDUCTION: This exam was performed according to the departmental dose-optimization program which includes automated exposure control, adjustment of the mA and/or kV according to  patient size and/or use of iterative reconstruction technique. COMPARISON:  None Available. FINDINGS: Osseous: No fracture or mandibular dislocation. No destructive process. Orbits: Negative. No traumatic or inflammatory finding. Sinuses: Air-fluid level and partial opacification in the right maxillary sinus with areas of internal mineralization and surrounding bony osteitis. Minimal fluid in the inferior left maxillary sinus and mild ethmoid air cell mucosal thickening. Remaining sinuses are largely clear. Soft tissues: Small (11 x 2 x 5 mm) peripherally enhancing fluid collection superficial to the left mastoid air cells (see series 3, image 62 and series 9, image 83). Mild surrounding edema. Other: Moderate left mastoid effusion. Bony mastoid septa appear intact. Limited intracranial: No significant or unexpected finding. IMPRESSION: 1. Moderate left mastoid effusion, compatible with reported mastoiditis. Small adjacent abscess (11 mm) along the superficial aspect of the mastoid air cells, compatible with abscess ( "Bezold" abcess). 2. 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 Electronically Signed   By: Margaretha Sheffield M.D.   On: 01/31/2023 13:39    EKG: Not performed in the emergency department  Assessment/Plan Principal Problem:   Mastoid abscess, left Active Problems:   Diabetes mellitus with coincident hypertension ([place])   Essential hypertension   gerd   Obesity due to excess calories   Chronic diastolic heart failure, NYHA class 2 (HCC)   History of TIA (transient ischemic attack)   Pure hypercholesterolemia   History of stroke   Mastoiditis Mastoid abscess > Patient presenting with ongoing left-sided ear pain radiating to jaw and neck not responding to p.o. amoxicillin plus ofloxacin eardrops. > CT showing mastoiditis and mastoid abscess. > Some pain with head movement but low suspicion for any meningitis given better alternative explanation.  CT neck ordered in  ED. > ENT consulted and will see the patient, recommending broad-spectrum antibiotics may need intervention. - Monitor on telemetry - Appreciate ENT recommendations - Continue with vancomycin and Zosyn as started in the ED - Pain control as needed with Toradol - Trend fever curve and WBC - Follow-up CT neck ordered in ED to evaluate patient sensation of "throat closing" ADDENDUM > Patient turned out to be assigned to Dr. Benjamine Mola, ENT.  He has been consulted and will see the patient tonight or tomorrow with initial plan for 24 hours of antibiotics and evaluate response.  Hypertension > Has had issues with her blood pressure in the last week or so, possibly in part due to pain.  Blood pressure has been in the [id number] systolic she reports.  Here has been in the 123456 to [id number] systolic.  States she did take her medicine today.** - Continue home lisinopril and metoprolol - Pain control - As needed labetalol for blood pressure greater than [id number] systolic.  Hypokalemia > Mild hypokalemia in the ED at 3.3. - Check magnesium - 40 mill equivalents p.o. potassium - Trend electrolytes  Chronic Diastolic CHF > Echo in 123456 showed EF 55-60%, G1 DD, normal RV function.  Not currently on any diuretics. - Continue to monitor  History of TIA/CVA Hyperlipidemia - Continue home atorvastatin, aspirin  GERD - Continue home PPI  Diabetes - SSI  Obesity - Noted  DVT prophylaxis: SCDs Code Status:   Full Family Communication:  Updated at bedside Disposition Plan:   Patient is from:  Home  Anticipated DC to:  Home  Anticipated DC date:  2  to 3 days  Anticipated DC barriers: None  Consults called:  ENT Admission status:  Inpatient, telemetry  Severity of Illness: The appropriate patient status for this patient is INPATIENT. Inpatient status is judged to be reasonable and necessary in order to provide the required intensity of service to ensure the patient's safety. The patient's presenting symptoms,  physical exam findings, and initial radiographic and laboratory data in the context of their chronic comorbidities is felt to place them at high risk for further clinical deterioration. Furthermore, it is not anticipated that the patient will be medically stable for discharge from the hospital within 2 midnights of admission.   * I certify that at the point of admission it is my clinical judgment that the patient will require inpatient hospital care spanning beyond 2 midnights from the point of admission due to high intensity of service, high risk for further deterioration and high frequency of surveillance required.Marcelyn Bruins MD [facility]  How to contact the [facility] Attending or Consulting provider [place] or covering provider during after hours [place], for this patient?   Check the care team in [facility] and look for a) attending/consulting [facility] provider listed and b) the [facility] team listed Log into [email or web address] and use [place]'s universal password to access. If you do not have the password, please contact the hospital operator. Locate the [facility] provider you are looking for under [facility] and page to a number that you can be directly reached. If you still have difficulty reaching the provider, please page the [facility] (Director on Call) for the Hospitalists listed on amion for assistance.  01/31/2023, 4:07 PM

## 2023-01-31 NOTE — ED Provider Notes (Signed)
[facility] Provider Note   CSN: [id number] Arrival date & time: 01/31/23  1127     History Chief Complaint  Patient presents with   Jaw Pain   Otalgia    [name] [name] is a 77 y.o. female with history of diabetes, hypertension presents emerged from today for evaluation of left ear pain for the past 2 weeks.  Patient reports that she was seen in urgent care and was given amoxicillin as well as an eardrop.  She has been on these for the past 6 days she has been trying to use but has had worsening of pain.  She reports that she also has pain with moving her neck as well.  Denies any headache.  Mentions that she has had a runny nose and nasal congestion well.  Reports that some days prior to her seeing urgent care that she had some leakage of purulent and bloody fluid out of the left ear as well.  Has not had any since then.  She reports that she has muffled hearing in that ear but no hearing loss.  Denies any trauma to the ear.  Denies any Q-tip use.   Otalgia Associated symptoms: congestion, neck pain and rhinorrhea   Associated symptoms: no fever        Home Medications Prior to Admission medications   Medication Sig Start Date End Date Taking? Authorizing Provider  acetaminophen (TYLENOL) 500 MG tablet Take 1 tablet (500 mg total) by mouth every 6 (six) hours as needed. 10/21/19   Fawze, Mina A, PA-C  albuterol (PROVENTIL HFA;VENTOLIN HFA) 108 (90 BASE) MCG/ACT inhaler Inhale 1-2 puffs into the lungs every 6 (six) hours as needed for wheezing. 07/29/13   Harden Mo, MD  aspirin EC 81 MG tablet Take 81 mg by mouth daily.    [provider]  atorvastatin (LIPITOR) 10 MG tablet Take 10 mg by mouth daily. 08/05/20   [provider]  benzonatate (TESSALON) 100 MG capsule Take 1 capsule (100 mg total) by mouth every 8 (eight) hours as needed for cough. 01/28/23   Teodora Medici, FNP  Cholecalciferol (VITAMIN D3) 1000 units  CAPS Take 1 capsule by mouth daily.    [provider]  dapagliflozin propanediol (FARXIGA) 5 MG TABS tablet Take 5 mg by mouth daily.    [provider]  fluticasone (FLONASE) 50 MCG/ACT nasal spray Place 2 sprays into both nostrils as needed for allergies. 04/09/22   [provider]  glimepiride (AMARYL) 2 MG tablet Take 2 mg by mouth daily. 03/17/22   [provider]  linagliptin (TRADJENTA) 5 MG TABS tablet Take 5 mg by mouth daily.    [provider]  lisinopril (ZESTRIL) 10 MG tablet Take 1 tablet by mouth daily. 07/25/22   [provider]  metoprolol succinate (TOPROL-XL) 50 MG 24 hr tablet TAKE 1 TABLET BY MOUTH DAILY WITH OR IMMEDIATELY FOLLOWING A MEAL 04/13/22   Swinyer, Lanice Schwab, NP  Multiple Vitamins-Minerals (PRESERVISION AREDS 2) CAPS Take 1 tablet by mouth 2 (two) times daily.    [provider]  nystatin cream (MYCOSTATIN) daily at 6 (six) AM. 09/20/22   [provider]  ofloxacin (FLOXIN) 0.3 % OTIC solution Place 10 drops into the left ear 2 (two) times daily for 14 days. 01/28/23 02/11/23  Teodora Medici, FNP  omeprazole (PRILOSEC) 40 MG capsule Take 1 capsule by mouth daily. 08/09/22   [provider]  triamcinolone ointment (  KENALOG) 0.5 % Apply topically daily at 6 (six) AM. 06/21/21   [provider]      Allergies    Adhesive [tape], Codeine, and Empagliflozin    Review of Systems   Review of Systems  Constitutional:  Negative for chills and fever.  HENT:  Positive for congestion, ear pain and rhinorrhea.   Respiratory:  Negative for shortness of breath.   Cardiovascular:  Negative for chest pain.  Musculoskeletal:  Positive for neck pain.    Physical Exam Updated Vital Signs BP (!) 208/101   Pulse 70   Temp 98.5 F (36.9 C)   Resp 16   Ht '5\' 3"'$  (1.6 m)   Wt 85.7 kg   SpO2 100%   BMI 33.48 kg/m  Physical Exam Vitals and nursing note reviewed.  Constitutional:       General: She is not in acute distress.    Appearance: She is not ill-appearing or toxic-appearing.  HENT:     Right Ear: Tympanic membrane, ear canal and external ear normal.     Ears:     Comments: White macerated tissue noted in the left external ear canal.  TM does appear intact does have some purulence but is not protruding.  Some minor tenderness to the mastoid on the left side without any overlying skin changes, fluctuance, or induration.  No mastoid tenderness on the right.  Pain with manipulation of the pinna on the left.    Nose: Nose normal.     Mouth/Throat:     Mouth: Mucous membranes are moist.  Neck:     Comments: Limited range of motion secondary to pain.  She can still flex and extend and move left right some.  Has pain in her ear whenever doing this. Pulmonary:     Effort: Pulmonary effort is normal. No respiratory distress.  Lymphadenopathy:     Cervical: No cervical adenopathy.  Skin:    General: Skin is warm and dry.  Neurological:     Mental Status: She is alert. Mental status is at baseline.     ED Results / Procedures / Treatments   Labs (all labs ordered are listed, but only abnormal results are displayed) Labs Reviewed  COMPREHENSIVE METABOLIC PANEL - Abnormal; Notable for the following components:      Result Value   Potassium 3.3 (*)    Glucose, Bld 131 (*)    AST 12 (*)    Alkaline Phosphatase 128 (*)    All other components within normal limits  CBC WITH DIFFERENTIAL/PLATELET    EKG None  Radiology CT Maxillofacial Wo Contrast  Result Date: 01/31/2023 CLINICAL DATA:  mastoiditis EXAM: CT MAXILLOFACIAL WITHOUT CONTRAST TECHNIQUE: Multidetector CT imaging of the maxillofacial structures was performed. Multiplanar CT image reconstructions were also generated. RADIATION DOSE REDUCTION: This exam was performed according to the departmental dose-optimization program which includes automated exposure control, adjustment of the mA and/or kV according to  patient size and/or use of iterative reconstruction technique. COMPARISON:  None Available. FINDINGS: Osseous: No fracture or mandibular dislocation. No destructive process. Orbits: Negative. No traumatic or inflammatory finding. Sinuses: Air-fluid level and partial opacification in the right maxillary sinus with areas of internal mineralization and surrounding bony osteitis. Minimal fluid in the inferior left maxillary sinus and mild ethmoid air cell mucosal thickening. Remaining sinuses are largely clear. Soft tissues: Small (11 x 2 x 5 mm) peripherally enhancing fluid collection superficial to the left mastoid air cells (see series 3, image 62 and series 9,  image 83). Mild surrounding edema. Other: Moderate left mastoid effusion. Bony mastoid septa appear intact. Limited intracranial: No significant or unexpected finding. IMPRESSION: 1. Moderate left mastoid effusion, compatible with reported mastoiditis. Small adjacent abscess (11 mm) along the superficial aspect of the mastoid air cells, compatible with abscess ( "Bezold" abcess). 2. 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 Electronically Signed   By: Margaretha Sheffield M.D.   On: 01/31/2023 13:39    Procedures Procedures   Medications Ordered in ED Medications  oxyCODONE (Oxy IR/ROXICODONE) immediate release tablet 5 mg (5 mg Oral Given 01/31/23 1539)  ondansetron (ZOFRAN-ODT) disintegrating tablet 4 mg (4 mg Oral Given 01/31/23 1539)    ED Course/ Medical Decision Making/ A&P   {                           Medical Decision Making Amount and/or Complexity of Data Reviewed Radiology: ordered.  Risk Prescription drug management. Decision regarding hospitalization.   77 y.o. female presents to the ER for evaluation of left-sided ear pain. Differential diagnosis includes but is not limited to malignant otitis externa, otitis externa, otitis media, mastoiditis, perforated TM, cerumen impaction, foreign body. Vital signs elevated  blood pressure otherwise afebrile, normal pulse rate, satting well room air without increased work of breathing. Physical exam as noted above.   Labs and imaging ordered in triage.  I independently reviewed and interpreted the patient's labs.  I independently reviewed and interpreted the patient's labs.  CBC without leukocytosis or anemia.  CMP shows mildly decreased potassium 3.3.  Glucose at 131.  Mildly decreased AST at 12 and mildly elevated alk phos at 128.  Otherwise no electrolyte or LFT abnormality.  CT imaging shows  1. Moderate left mastoid effusion, compatible with reported mastoiditis. Small adjacent abscess (11 mm) along the superficial aspect of the mastoid air cells, compatible with abscess ( "Bezold" abcess). 2. 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  I consulted Dr. Janace Hoard and spoke to about the CT reading.  He reports this patient may need a TM tube or washout.  He will come in to see the patient.  Recommends broad-spectrum antibiotics and medicine admission.  I spoke with the patient and her member at bedside who are amenable to admission.  I spoke with the hospitalist Dr. Trilby Drummer about this patient.  I do not think this patient has any meningitis given that I think that her neck pain is from the mastoid as well as the small abscess seen.  Will order CT of soft tissue neck with contrast.  The patient is not altered, or febrile, or have a leukocytosis.  I have a low suspicion for meningitis at this time however could be considered if she does not have any improvement as this is a complication of mastoiditis.  Dr. Trilby Drummer aware.  Portions of this report may have been transcribed using voice recognition software. Every effort was made to ensure accuracy; however, inadvertent computerized transcription errors may be present.   Final Clinical Impression(s) / ED Diagnoses Final diagnoses:  Acute mastoiditis of left side with complication    Rx / DC Orders ED  Discharge Orders     None         Sherrell Puller, PA-C 01/31/23 1911    Gareth Morgan, MD 01/31/23 [phone]

## 2023-01-31 NOTE — Progress Notes (Signed)
Pharmacy Antibiotic Note  [name] [name] is a 77 y.o. female admitted on 01/31/2023 with  mastoiditis .  Pharmacy has been consulted for vancomycin and zosyn dosing.  Plan: Zosyn 3.375g IV q8h (4 hour infusion). IV Vancomycin '1500mg'$  every 24 hours for eAUC459. Using Vd: 0.5, BMI: 33, Scr: 0.96.  Follow culture data for de-escalation.  Monitor renal function for dose adjustments as indicated.    Height: '5\' 3"'$  (160 cm) Weight: 85.7 kg (189 lb) IBW/kg (Calculated) : 52.4  Temp (24hrs), Avg:98.5 F (36.9 C), Min:98.4 F (36.9 C), Max:98.5 F (36.9 C)  Recent Labs  Lab 01/31/23 1236  WBC 7.5  CREATININE 0.96    Estimated Creatinine Clearance: 50.9 mL/min (by C-G formula based on SCr of 0.96 mg/dL).    Allergies  Allergen Reactions   Adhesive [Tape] Rash    pls use elastic wraps instead of adhesive tape   Codeine Nausea And Vomiting   Empagliflozin Other (See Comments)    Headaches & lower back pain    Thank you for allowing pharmacy to be a part of this patient's care.  Esmeralda Arthur, PharmD, BCCCP  01/31/2023 3:46 PM

## 2023-01-31 NOTE — Plan of Care (Signed)

## 2023-01-31 NOTE — ED Notes (Signed)
Patient wheeled up before I could give prn labetalol informed receiving nurse she would need that but that her BP is high bc she is in pain and having difficulty swallowing her regular meds.

## 2023-01-31 NOTE — ED Triage Notes (Signed)
Reports left ear pain that radiates down her neck and into her jaw.  Reports "feel like my throat is closing".  No trouble breathing or handling secretions.  Reports it hurts to open her jaw due to her ear.  Reports was see by PCP and UC on antibiotics and ear drops.

## 2023-01-31 NOTE — ED Provider Triage Note (Signed)
Emergency Medicine Provider Triage Evaluation Note  [name] [name] , a 77 y.o. female  was evaluated in triage.  Pt complains of left ear pain that is been ongoing for the past week, placed on antibiotics such as amoxicillin, taken 6-day course.  In addition, she is also obtaining drops that she has been placing to her left ear.  Worsening pain to her left ear radiating behind her ear, tracking down to her neck. Patient is also diabetic.   Review of Systems  Positive: Left ear pain, neck pain Negative: fever  Physical Exam  BP (!) 211/102   Pulse 76   Temp 98.4 F (36.9 C) (Oral)   Resp (!) 24   Ht '5\' 3"'$  (1.6 m)   Wt 85.7 kg   SpO2 100%   BMI 33.48 kg/m  Gen:   Awake, no distress   Resp:  Normal effort  MSK:   Moves extremities without difficulty  Other:  Left mastoid if very tender, TM is not visualized, left ear canal with purulent discharge.   Medical Decision Making  Medically screening exam initiated at 12:31 PM.  Appropriate orders placed.  [name] was informed that the remainder of the evaluation will be completed by another provider, this initial triage assessment does not replace that evaluation, and the importance of remaining in the ED until their evaluation is complete.     Janeece Fitting, PA-C 01/31/23 1231

## 2023-02-01 LAB — CBC
HCT: 41.8 % (ref 36.0–46.0)
Hemoglobin: 13.2 g/dL (ref 12.0–15.0)
MCH: 29.5 pg (ref 26.0–34.0)
MCHC: 31.6 g/dL (ref 30.0–36.0)
MCV: 93.3 fL (ref 80.0–100.0)
Platelets: 263 10*3/uL (ref 150–400)
RBC: 4.48 MIL/uL (ref 3.87–5.11)
RDW: 14.6 % (ref 11.5–15.5)
WBC: 7.9 10*3/uL (ref 4.0–10.5)
nRBC: 0 % (ref 0.0–0.2)

## 2023-02-01 LAB — GLUCOSE, CAPILLARY
Glucose-Capillary: 118 mg/dL — ABNORMAL HIGH (ref 70–99)
Glucose-Capillary: 122 mg/dL — ABNORMAL HIGH (ref 70–99)
Glucose-Capillary: 137 mg/dL — ABNORMAL HIGH (ref 70–99)
Glucose-Capillary: 138 mg/dL — ABNORMAL HIGH (ref 70–99)
Glucose-Capillary: 147 mg/dL — ABNORMAL HIGH (ref 70–99)
Glucose-Capillary: 216 mg/dL — ABNORMAL HIGH (ref 70–99)
Glucose-Capillary: 58 mg/dL — ABNORMAL LOW (ref 70–99)
Glucose-Capillary: 91 mg/dL (ref 70–99)

## 2023-02-01 LAB — COMPREHENSIVE METABOLIC PANEL
ALT: 9 U/L (ref 0–44)
AST: 13 U/L — ABNORMAL LOW (ref 15–41)
Albumin: 2.8 g/dL — ABNORMAL LOW (ref 3.5–5.0)
Alkaline Phosphatase: 108 U/L (ref 38–126)
Anion gap: 8 (ref 5–15)
BUN: 18 mg/dL (ref 8–23)
CO2: 22 mmol/L (ref 22–32)
Calcium: 8.8 mg/dL — ABNORMAL LOW (ref 8.9–10.3)
Chloride: 107 mmol/L (ref 98–111)
Creatinine, Ser: 1.35 mg/dL — ABNORMAL HIGH (ref 0.44–1.00)
GFR, Estimated: 40 mL/min — ABNORMAL LOW (ref >60)
Glucose, Bld: 191 mg/dL — ABNORMAL HIGH (ref 70–99)
Potassium: 3.4 mmol/L — ABNORMAL LOW (ref 3.5–5.1)
Sodium: 137 mmol/L (ref 135–145)
Total Bilirubin: 0.7 mg/dL (ref 0.3–1.2)
Total Protein: 6.2 g/dL — ABNORMAL LOW (ref 6.5–8.1)

## 2023-02-01 LAB — TYPE AND SCREEN
ABO/RH(D): B POS
Antibody Screen: NEGATIVE

## 2023-02-01 MED ORDER — ACETAMINOPHEN 500 MG PO TABS
1000.0000 mg | ORAL_TABLET | Freq: Once | ORAL | Status: AC
  Filled 2023-02-01: qty 2

## 2023-02-01 MED ORDER — TRAMADOL HCL 50 MG PO TABS
50.0000 mg | ORAL_TABLET | Freq: Four times a day (QID) | ORAL | Status: DC | PRN
  Administered 2023-02-01: 50 mg via ORAL
  Filled 2023-02-01: qty 1

## 2023-02-01 MED ORDER — ACETAMINOPHEN 500 MG PO TABS
500.0000 mg | ORAL_TABLET | Freq: Four times a day (QID) | ORAL | Status: DC | PRN
  Administered 2023-02-01: 1000 mg via ORAL

## 2023-02-01 MED ORDER — INSULIN ASPART 100 UNIT/ML IJ SOLN
0.0000 [IU] | Freq: Three times a day (TID) | INTRAMUSCULAR | Status: DC
  Administered 2023-02-01: 1 [IU] via SUBCUTANEOUS

## 2023-02-01 MED ORDER — VANCOMYCIN HCL 1250 MG/250ML IV SOLN: 1250.0000 mg | INTRAVENOUS | Status: DC

## 2023-02-01 MED ORDER — SODIUM CHLORIDE 0.9 % IV BOLUS
1000.0000 mL | Freq: Once | INTRAVENOUS | Status: AC
  Administered 2023-02-01: 1000 mL via INTRAVENOUS

## 2023-02-01 MED ORDER — ACETAMINOPHEN 650 MG RE SUPP: 650.0000 mg | Freq: Four times a day (QID) | RECTAL | Status: DC | PRN

## 2023-02-01 NOTE — Consult Note (Signed)
Reason for Consult: Left ear pain  HPI:  [name] [name] is an 77 y.o. female who was admitted from the emergency room yesterday for treatment of her left ear pain.  The patient has a history of chronic left otitis media with effusion.  She previously underwent left myringotomy and tube placement 1 year ago.  According to the patient, she started experiencing left ear pain 2 weeks ago.  She was treated with amoxicillin and antibiotic eardrops without improvement in her symptoms.  She started experiencing trismus 2 days ago.  She presented to the [facility] emergency room yesterday.  Her CT scan showed partial left mastoid effusion.  Her neck CT scan showed no soft tissue abscess.  The patient was admitted for IV antibiotic treatment.  She reports improvement in her left ear pain overnight.  Past Medical History:  Diagnosis Date   Arthritis    Bronchitis    hx   Chest pain 2009   MV with no scar or ischemia, EF [id number]   Complication of anesthesia    claustrophobic !   CVA (cerebral vascular accident) ([place] [facility]) 05/05/2012   Diabetes mellitus    GERD (gastroesophageal reflux disease)    History of cardiac catheterization    a. 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 CT 1/17: ascending aorta 4.2 x 4.0 cm    Past Surgical History:  Procedure Laterality Date   ABDOMINAL HYSTERECTOMY  2006   Partial, one ovary left    CARDIAC CATHETERIZATION  2004   [place], [place].; [place] per pt.   CHOLECYSTECTOMY     EAR CYST EXCISION Left 09/20/2016   Procedure: EXCISION  LEFT EAR LESION;  Surgeon: Leta Baptist, MD;  Location: [place] OR;  Service: ENT;  Laterality: Left;   LEFT HEART CATH AND CORONARY  ANGIOGRAPHY N/A 06/10/2021   Procedure: LEFT HEART CATH AND CORONARY ANGIOGRAPHY;  Surgeon: Sherren Mocha, MD;  Location: [facility] CV LAB;  Service: Cardiovascular;  Laterality: N/A;   LEFT HEART CATHETERIZATION WITH CORONARY ANGIOGRAM N/A 01/09/2014   Procedure: LEFT HEART CATHETERIZATION WITH CORONARY ANGIOGRAM;  Surgeon: Leonie Man, MD;  Location: [facility] CATH LAB;  Service: Cardiovascular;  Laterality: N/A;   MENISCUS REPAIR Right    ROTATOR CUFF REPAIR Left    SKIN SPLIT GRAFT Left 09/20/2016   Procedure: SKIN GRAFT SPLIT THICKNESS;  Surgeon: Leta Baptist, MD;  Location: [facility] OR;  Service: ENT;  Laterality: Left;   TEMPOROMANDIBULAR JOINT SURGERY     TONSILLECTOMY     TYMPANOSTOMY TUBE PLACEMENT Left 06/03/2022   WISDOM TOOTH EXTRACTION      Family History  Problem Relation Age of Onset   Heart disease Father    Valvular heart disease Father        aortic valve replacement   Pancreatitis Mother    Diabetes Maternal Grandmother    Heart disease Brother    Valvular heart disease Brother        aortic valve replacement   CAD Son        heart attack   Kidney disease Son     Social History:  reports that she has never  smoked. She has never used smokeless tobacco. She reports that she does not drink alcohol and does not use drugs.  Allergies:  Allergies  Allergen Reactions   Adhesive [Tape] Rash    pls use elastic wraps instead of adhesive tape   Codeine Nausea And Vomiting   Empagliflozin Other (See Comments)    Headaches & lower back pain    Prior to Admission medications   Medication Sig Start Date End Date Taking? Authorizing Provider  acetaminophen (TYLENOL) 500 MG tablet Take 1 tablet (500 mg total) by mouth every 6 (six) hours as needed. Patient taking differently: Take 500 mg by mouth every 6 (six) hours as needed for moderate pain. 10/21/19  Yes Fawze, Mina A, PA-C  albuterol (PROVENTIL HFA;VENTOLIN HFA) 108 (90 BASE) MCG/ACT inhaler Inhale 1-2 puffs into the lungs  every 6 (six) hours as needed for wheezing. 07/29/13  Yes Harden Mo, MD  aspirin EC 81 MG tablet Take 81 mg by mouth daily.   Yes [provider]  atorvastatin (LIPITOR) 10 MG tablet Take 10 mg by mouth daily. 08/05/20  Yes [provider]  benzonatate (TESSALON) 100 MG capsule Take 1 capsule (100 mg total) by mouth every 8 (eight) hours as needed for cough. 01/28/23  Yes Mound, Hildred Alamin E, FNP  Cholecalciferol (VITAMIN D3) 1000 units CAPS Take 1 capsule by mouth daily.   Yes [provider]  dapagliflozin propanediol (FARXIGA) 5 MG TABS tablet Take 5 mg by mouth daily.   Yes [provider]  fluticasone (FLONASE) 50 MCG/ACT nasal spray Place 2 sprays into both nostrils as needed for allergies. 04/09/22  Yes [provider]  glimepiride (AMARYL) 2 MG tablet Take 2 mg by mouth daily. 03/17/22  Yes [provider]  linagliptin (TRADJENTA) 5 MG TABS tablet Take 5 mg by mouth daily.   Yes [provider]  lisinopril (ZESTRIL) 10 MG tablet Take 1 tablet by mouth daily. 07/25/22  Yes [provider]  metoprolol succinate (TOPROL-XL) 50 MG 24 hr tablet TAKE 1 TABLET BY MOUTH DAILY WITH OR IMMEDIATELY FOLLOWING A MEAL Patient taking differently: Take 50 mg by mouth daily. 04/13/22  Yes Swinyer, Lanice Schwab, NP  Multiple Vitamins-Minerals (PRESERVISION AREDS 2) CAPS Take 1 tablet by mouth 2 (two) times daily.   Yes [provider]  ofloxacin (FLOXIN) 0.3 % OTIC solution Place 10 drops into the left ear 2 (two) times daily for 14 days. 01/28/23 02/11/23 Yes Mound, Michele Rockers, FNP  omeprazole (PRILOSEC) 40 MG capsule Take 1 capsule by mouth daily. 08/09/22  Yes [provider]  triamcinolone ointment (KENALOG) 0.5 % Apply 1 Application topically daily as needed (for rash). 06/21/21  Yes [provider]  nystatin cream (MYCOSTATIN) Apply 1 Application topically daily at 6 (six) AM. Patient not taking: Reported on 01/31/2023  09/20/22   [provider]    Medications: I have reviewed the patient's current medications. Scheduled:  aspirin EC  81 mg Oral Daily   atorvastatin  10 mg Oral Daily   insulin aspart  0-9 Units Subcutaneous Q4H   lisinopril  10 mg Oral Daily   metoprolol succinate  50 mg Oral Daily   pantoprazole  40 mg Oral Daily   sodium chloride flush  3 mL Intravenous Q12H   Continuous:  piperacillin-tazobactam (ZOSYN)  IV 3.375 g (02/01/23 0610)   sodium chloride     vancomycin Stopped (01/31/23 2246)   [id number] **OR** acetaminophen, ketorolac, labetalol, polyethylene glycol  Results for orders  placed or performed during the hospital encounter of 01/31/23 (from the past 48 hour(s))  CBC with Differential     Status: None   Collection Time: 01/31/23 12:36 PM  Result Value Ref Range   WBC 7.5 4.0 - 10.5 K/uL   RBC 4.88 3.87 - 5.11 MIL/uL   Hemoglobin 14.3 12.0 - 15.0 g/dL   HCT 44.9 36.0 - 46.0 %   MCV 92.0 80.0 - 100.0 fL   MCH 29.3 26.0 - 34.0 pg   MCHC 31.8 30.0 - 36.0 g/dL   RDW 14.5 11.5 - 15.5 %   Platelets 297 150 - 400 K/uL   nRBC 0.0 0.0 - 0.2 %   Neutrophils Relative % 65 %   Neutro Abs 4.9 1.7 - 7.7 K/uL   Lymphocytes Relative 27 %   Lymphs Abs 2.0 0.7 - 4.0 K/uL   Monocytes Relative 6 %   Monocytes Absolute 0.4 0.1 - 1.0 K/uL   Eosinophils Relative 1 %   Eosinophils Absolute 0.1 0.0 - 0.5 K/uL   Basophils Relative 1 %   Basophils Absolute 0.0 0.0 - 0.1 K/uL   Immature Granulocytes 0 %   Abs Immature Granulocytes 0.02 0.00 - 0.07 K/uL    Comment: Performed at [facility], [address], [place], [place] [address]  Comprehensive metabolic panel     Status: Abnormal   Collection Time: 01/31/23 12:36 PM  Result Value Ref Range   Sodium 139 135 - 145 mmol/L   Potassium 3.3 (L) 3.5 - 5.1 mmol/L   Chloride 106 98 - 111 mmol/L   CO2 24 22 - 32 mmol/L   Glucose, Bld 131 (H) 70 - 99 mg/dL    Comment: Glucose reference range applies only to samples  taken after fasting for at least 8 hours.   BUN 14 8 - 23 mg/dL   Creatinine, Ser 0.96 0.44 - 1.00 mg/dL   Calcium 9.1 8.9 - 10.3 mg/dL   Total Protein 7.5 6.5 - 8.1 g/dL   Albumin 3.5 3.5 - 5.0 g/dL   AST 12 (L) 15 - 41 U/L   ALT 11 0 - 44 U/L   Alkaline Phosphatase 128 (H) 38 - 126 U/L   Total Bilirubin 0.3 0.3 - 1.2 mg/dL   GFR, Estimated >60 >60 mL/min    Comment: (NOTE) Calculated using the CKD-EPI Creatinine Equation (2021)    Anion gap 9 5 - 15    Comment: Performed at [facility] [address], [place], [place] [address]  Magnesium     Status: None   Collection Time: 01/31/23 12:36 PM  Result Value Ref Range   Magnesium 1.9 1.7 - 2.4 mg/dL    Comment: Performed at [facility], [place] [address], [place], [place] [address]  Glucose, capillary     Status: Abnormal   Collection Time: 01/31/23  4:57 PM  Result Value Ref Range   Glucose-Capillary 116 (H) 70 - 99 mg/dL    Comment: Glucose reference range applies only to samples taken after fasting for at least 8 hours.  Glucose, capillary     Status: Abnormal   Collection Time: 01/31/23 10:52 PM  Result Value Ref Range   Glucose-Capillary 219 (H) 70 - 99 mg/dL    Comment: Glucose reference range applies only to samples taken after fasting for at least 8 hours.  Glucose, capillary     Status: Abnormal   Collection Time: 02/01/23 12:10 AM  Result Value Ref Range  Glucose-Capillary 216 (H) 70 - 99 mg/dL    Comment: Glucose reference range applies only to samples taken after fasting for at least 8 hours.  Comprehensive metabolic panel     Status: Abnormal   Collection Time: 02/01/23  1:27 AM  Result Value Ref Range   Sodium 137 135 - 145 mmol/L   Potassium 3.4 (L) 3.5 - 5.1 mmol/L   Chloride 107 98 - 111 mmol/L   CO2 22 22 - 32 mmol/L   Glucose, Bld 191 (H) 70 - 99 mg/dL    Comment: Glucose reference range applies only to samples taken after fasting for at least 8 hours.   BUN 18 8 - 23 mg/dL   Creatinine,  Ser 1.35 (H) 0.44 - 1.00 mg/dL   Calcium 8.8 (L) 8.9 - 10.3 mg/dL   Total Protein 6.2 (L) 6.5 - 8.1 g/dL   Albumin 2.8 (L) 3.5 - 5.0 g/dL   AST 13 (L) 15 - 41 U/L   ALT 9 0 - 44 U/L   Alkaline Phosphatase 108 38 - 126 U/L   Total Bilirubin 0.7 0.3 - 1.2 mg/dL   GFR, Estimated 40 (L) >60 mL/min    Comment: (NOTE) Calculated using the CKD-EPI Creatinine Equation (2021)    Anion gap 8 5 - 15    Comment: Performed at [facility], [place] [address], [place], [place] [address]  CBC     Status: None   Collection Time: 02/01/23  1:27 AM  Result Value Ref Range   WBC 7.9 4.0 - 10.5 K/uL   RBC 4.48 3.87 - 5.11 MIL/uL   Hemoglobin 13.2 12.0 - 15.0 g/dL   HCT 41.8 36.0 - 46.0 %   MCV 93.3 80.0 - 100.0 fL   MCH 29.5 26.0 - 34.0 pg   MCHC 31.6 30.0 - 36.0 g/dL   RDW 14.6 11.5 - 15.5 %   Platelets 263 150 - 400 K/uL   nRBC 0.0 0.0 - 0.2 %    Comment: Performed at [facility], [place] [address], [place], [place] [address]  Type and screen [place]     Status: None   Collection Time: 02/01/23  1:27 AM  Result Value Ref Range   ABO/RH(D) B POS    Antibody Screen NEG    Sample Expiration      02/04/2023,2359 Performed at [facility], [place] [address] [place], [place] [address]   Glucose, capillary     Status: Abnormal   Collection Time: 02/01/23  4:15 AM  Result Value Ref Range   Glucose-Capillary 58 (L) 70 - 99 mg/dL    Comment: Glucose reference range applies only to samples taken after fasting for at least 8 hours.  Glucose, capillary     Status: None   Collection Time: 02/01/23  4:56 AM  Result Value Ref Range   Glucose-Capillary 91 70 - 99 mg/dL    Comment: Glucose reference range applies only to samples taken after fasting for at least 8 hours.  Glucose, capillary     Status: Abnormal   Collection Time: 02/01/23  8:29 AM  Result Value Ref Range   Glucose-Capillary 122 (H) 70 - 99 mg/dL    Comment: Glucose reference range applies only to samples  taken after fasting for at least 8 hours.    CT Soft Tissue Neck W Contrast  Result Date: 01/31/2023 CLINICAL DATA:  Soft tissue infection EXAM: CT NECK WITH CONTRAST TECHNIQUE: Multidetector CT imaging of the  neck was performed using the standard protocol following the bolus administration of intravenous contrast. RADIATION DOSE REDUCTION: This exam was performed according to the departmental dose-optimization program which includes automated exposure control, adjustment of the mA and/or kV according to patient size and/or use of iterative reconstruction technique. CONTRAST:  25m OMNIPAQUE IOHEXOL 350 MG/ML SOLN COMPARISON:  Maxillofacial CT 01/31/2023 FINDINGS: Pharynx and larynx: Normal. No mass or swelling. Salivary glands: No inflammation, mass, or stone. Thyroid: Normal. Lymph nodes: None enlarged or abnormal density. Vascular: Negative. Limited intracranial: Negative. Visualized orbits: Negative. Mastoids and visualized paranasal sinuses: Left mastoid effusion. Middle ear cavity is clear. No abscess or drainable fluid collection. Complete opacification of the right maxillary sinus Skeleton: No acute or aggressive process. Upper chest: Negative. Other: None. IMPRESSION: 1. Left mastoid effusion without coalescence.  There is no abscess. 2. Complete opacification of the right maxillary sinus. Electronically Signed   By: KUlyses JarredM.D.   On: 01/31/2023 19:47   CT Maxillofacial Wo Contrast  Result Date: 01/31/2023 CLINICAL DATA:  mastoiditis EXAM: CT MAXILLOFACIAL WITHOUT CONTRAST TECHNIQUE: Multidetector CT imaging of the maxillofacial structures was performed. Multiplanar CT image reconstructions were also generated. RADIATION DOSE REDUCTION: This exam was performed according to the departmental dose-optimization program which includes automated exposure control, adjustment of the mA and/or kV according to patient size and/or use of iterative reconstruction technique. COMPARISON:  None Available.  FINDINGS: Osseous: No fracture or mandibular dislocation. No destructive process. Orbits: Negative. No traumatic or inflammatory finding. Sinuses: Air-fluid level and partial opacification in the right maxillary sinus with areas of internal mineralization and surrounding bony osteitis. Minimal fluid in the inferior left maxillary sinus and mild ethmoid air cell mucosal thickening. Remaining sinuses are largely clear. Soft tissues: Small (11 x 2 x 5 mm) peripherally enhancing fluid collection superficial to the left mastoid air cells (see series 3, image 62 and series 9, image 83). Mild surrounding edema. Other: Moderate left mastoid effusion. Bony mastoid septa appear intact. Limited intracranial: No significant or unexpected finding. IMPRESSION: 1. Moderate left mastoid effusion, compatible with reported mastoiditis. Small adjacent abscess (11 mm) along the superficial aspect of the mastoid air cells, compatible with abscess ( "Bezold" abcess). 2. 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 Electronically Signed   By: FMargaretha SheffieldM.D.   On: 01/31/2023 13:39    Review of Systems  Constitutional:  Negative for chills and fever.  HENT:  Positive for congestion, ear pain and rhinorrhea.   Respiratory:  Negative for shortness of breath.   Cardiovascular:  Negative for chest pain.  Musculoskeletal:  Positive for neck pain.   Blood pressure (!) 165/85, pulse 67, temperature 98 F (36.7 C), temperature source Oral, resp. rate 17, height '5\' 3"'$  (1.6 m), weight 88.6 kg, SpO2 99 %. General appearance: alert and cooperative Head: Normocephalic, without obvious abnormality, atraumatic Eyes: Pupils are equal, round, reactive to light. Extraocular motion is intact.  Ears: Examination of the ears shows normal auricles bilaterally. Squamous debris is noted within the left ear canal.  The right tympanic membrane is normal. The mastoids are nontender to touch.  No palpable fluctuance. Nose: Nasal  examination shows normal mucosa, septum, turbinates.  Face: Facial examination shows no asymmetry. Palpation of the face elicit no significant tenderness.  Mouth: Oral cavity examination shows no mucosal lacerations. No significant trismus is noted.  Neck: Palpation of the neck reveals no lymphadenopathy or mass. The trachea is midline. The thyroid is not significantly enlarged.  Neuro: Cranial nerves 2-12  are all grossly in tact.   Assessment/Plan: Left otitis media with recent tube otorrhea. -No mastoiditis or abscess is noted on today's examination. -Her CT scan showed right maxillary sinusitis. -May DC home on oral antibiotic (e.g. Augmentin) and Ciprodex eardrops 4 drops left ear twice daily for 10 days. -The patient may follow-up as an outpatient.  Malashia Kamaka W Madilynne Mullan 02/01/2023, 11:41 AM

## 2023-02-01 NOTE — Inpatient Diabetes Management (Signed)
Inpatient Diabetes Program Recommendations  AACE/ADA: New Consensus Statement on Inpatient Glycemic Control (2015)  Target Ranges:  Prepandial:   less than 140 mg/dL      Peak postprandial:   less than 180 mg/dL (1-2 hours)      Critically ill patients:  140 - 180 mg/dL   Lab Results  Component Value Date   GLUCAP 122 (H) 02/01/2023   HGBA1C 7.3 (H) 06/09/2021    Review of Glycemic Control  Latest Reference Range & Units 01/31/23 16:57 01/31/23 22:52 02/01/23 00:10 02/01/23 04:15 02/01/23 04:56 02/01/23 08:29  Glucose-Capillary 70 - 99 mg/dL 116 (H) 219 (H) 216 (H) 58 (L) 91 122 (H)    Diabetes history: DM 2 Outpatient Diabetes medications: Tradjenta 5 mg Daily, Amaryl 2 mg Daily, Farxia 5 mg Daily Current orders for Inpatient glycemic control:  Novolog 0-9 units q4  Note: Hypoglycemia 58 at 0400 am. Diet ordered.  Inpatient Diabetes Program Recommendations:    -  Consider changing frequency of Novolog Correction scale to tid + hs scale.   Thanks,  Tama Headings RN, MSN, BC-ADM Inpatient Diabetes Coordinator Team Pager [phone] (8a-5p)

## 2023-02-01 NOTE — Progress Notes (Addendum)
PROGRESS NOTE    [name] [name]  [id number] DOB: 02-Sep-1946 DOA: 01/31/2023 PCP: [name] Barter, MD   Brief Narrative: [name] [name] is a 77 y.o. female with a history of TIA/CVA, hyperlipidemia, hypertension, obesity, GERD, diabetes mellitus, migraines, diastolic heart failure, ascending aortic aneurysm. Patient presented secondary to left ear/jaw pain and was found to have left mastoiditis with presence of left 1 cm abscess. Empiric antibiotics started. ENT consulted.   Assessment and Plan:  Mastoiditis Mastoid abscess Patient with neck/facial swelling and pain. CT maxillofacial significant for 11 m abscess along superficial aspect of the mastoid air cells  AKI Baseline creatinine of about 1. Creatinine worsened to 1.35 likely secondary to poor oral intake. Complicated by lisinopril use. -NS IV fluids -BMP in AM -Hold lisinopril  Maxillary sinusitis Chronic per radiology read. Will likely need prolonged antibiotic regimen  Primary hypertension -Continue metoprolol -Hold lisinopril  Hypokalemia Mild. -Potassium supplementation  Chronic diastolic heart failure Stable.  History of TIA Hyperlipidemia -Continue Lipitor  GERD -Continue Protonix  Diabetes mellitus type 2 Controlled from most recent hemoglobin A1C of 7.3% from 05/2021. Patient is managed on glimepiride, linagliptin and Wilder Glade as an outpatient which are held on admission.   Obestiy Estimated body mass index is 34.6 kg/m as calculated from the following:   Height as of this encounter: '5\' 3"'$  (1.6 m).   Weight as of this encounter: 88.6 kg.   DVT prophylaxis: SCDs Code Status:   Code Status: Full Code Family Communication: None at bedside Disposition Plan: Discharge home likely in 24 hours if AKI improved   Consultants:  ENT  Procedures:  None  Antimicrobials: Zosyn IV    Subjective: Patient reports improvement of swelling. Swallowing has improved.  Objective: BP (!)  165/85 (BP Location: Right Arm)   Pulse 67   Temp 98 F (36.7 C) (Oral)   Resp 17   Ht '5\' 3"'$  (1.6 m)   Wt 88.6 kg   SpO2 99%   BMI 34.60 kg/m   Examination:  General exam: Appears calm and comfortable Respiratory system: Clear to auscultation. Respiratory effort normal. Cardiovascular system: S1 & S2 heard, RRR. Gastrointestinal system: Abdomen is nondistended, soft and nontender. No organomegaly or masses felt. Normal bowel sounds heard. Central nervous system: Alert and oriented. No focal neurological deficits. Musculoskeletal: No edema. No calf tenderness Skin: No cyanosis. No rashes Psychiatry: Judgement and insight appear normal. Mood & affect appropriate.    Data Reviewed: I have personally reviewed following labs and imaging studies  CBC Lab Results  Component Value Date   WBC 7.9 02/01/2023   RBC 4.48 02/01/2023   HGB 13.2 02/01/2023   HCT 41.8 02/01/2023   MCV 93.3 02/01/2023   MCH 29.5 02/01/2023   PLT 263 02/01/2023   MCHC 31.6 02/01/2023   RDW 14.6 02/01/2023   LYMPHSABS 2.0 01/31/2023   MONOABS 0.4 01/31/2023   EOSABS 0.1 01/31/2023   BASOSABS 0.0 [id number]     Last metabolic panel Lab Results  Component Value Date   NA 137 02/01/2023   K 3.4 (L) 02/01/2023   CL 107 02/01/2023   CO2 22 02/01/2023   BUN 18 02/01/2023   CREATININE 1.35 (H) 02/01/2023   GLUCOSE 191 (H) 02/01/2023   GFRNONAA 40 (L) 02/01/2023   GFRAA 57 (L) 05/02/2020   CALCIUM 8.8 (L) 02/01/2023   PROT 6.2 (L) 02/01/2023   ALBUMIN 2.8 (L) 02/01/2023   BILITOT 0.7 02/01/2023   ALKPHOS 108 02/01/2023   AST 13 (L)  02/01/2023   ALT 9 02/01/2023   ANIONGAP 8 02/01/2023    GFR: Estimated Creatinine Clearance: 36.9 mL/min (A) (by C-G formula based on SCr of 1.35 mg/dL (H)).  No results found for this or any previous visit (from the past 240 hour(s)).    Radiology Studies: CT Soft Tissue Neck W Contrast  Result Date: 01/31/2023 CLINICAL DATA:  Soft tissue infection EXAM:  CT NECK WITH CONTRAST TECHNIQUE: Multidetector CT imaging of the neck was performed using the standard protocol following the bolus administration of intravenous contrast. RADIATION DOSE REDUCTION: This exam was performed according to the departmental dose-optimization program which includes automated exposure control, adjustment of the mA and/or kV according to patient size and/or use of iterative reconstruction technique. CONTRAST:  66m OMNIPAQUE IOHEXOL 350 MG/ML SOLN COMPARISON:  Maxillofacial CT 01/31/2023 FINDINGS: Pharynx and larynx: Normal. No mass or swelling. Salivary glands: No inflammation, mass, or stone. Thyroid: Normal. Lymph nodes: None enlarged or abnormal density. Vascular: Negative. Limited intracranial: Negative. Visualized orbits: Negative. Mastoids and visualized paranasal sinuses: Left mastoid effusion. Middle ear cavity is clear. No abscess or drainable fluid collection. Complete opacification of the right maxillary sinus Skeleton: No acute or aggressive process. Upper chest: Negative. Other: None. IMPRESSION: 1. Left mastoid effusion without coalescence.  There is no abscess. 2. Complete opacification of the right maxillary sinus. Electronically Signed   By: KUlyses JarredM.D.   On: 01/31/2023 19:47   CT Maxillofacial Wo Contrast  Result Date: 01/31/2023 CLINICAL DATA:  mastoiditis EXAM: CT MAXILLOFACIAL WITHOUT CONTRAST TECHNIQUE: Multidetector CT imaging of the maxillofacial structures was performed. Multiplanar CT image reconstructions were also generated. RADIATION DOSE REDUCTION: This exam was performed according to the departmental dose-optimization program which includes automated exposure control, adjustment of the mA and/or kV according to patient size and/or use of iterative reconstruction technique. COMPARISON:  None Available. FINDINGS: Osseous: No fracture or mandibular dislocation. No destructive process. Orbits: Negative. No traumatic or inflammatory finding. Sinuses:  Air-fluid level and partial opacification in the right maxillary sinus with areas of internal mineralization and surrounding bony osteitis. Minimal fluid in the inferior left maxillary sinus and mild ethmoid air cell mucosal thickening. Remaining sinuses are largely clear. Soft tissues: Small (11 x 2 x 5 mm) peripherally enhancing fluid collection superficial to the left mastoid air cells (see series 3, image 62 and series 9, image 83). Mild surrounding edema. Other: Moderate left mastoid effusion. Bony mastoid septa appear intact. Limited intracranial: No significant or unexpected finding. IMPRESSION: 1. Moderate left mastoid effusion, compatible with reported mastoiditis. Small adjacent abscess (11 mm) along the superficial aspect of the mastoid air cells, compatible with abscess ( "Bezold" abcess). 2. 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 Electronically Signed   By: FMargaretha SheffieldM.D.   On: 01/31/2023 13:39      LOS: 1 day    RCordelia Poche MD [facility] 02/01/2023, 1:06 PM   If 7PM-7AM, please contact night-coverage [email or web address]

## 2023-02-01 NOTE — Progress Notes (Signed)
Pharmacy Antibiotic Note  [name] [name] is a 77 y.o. female admitted on 01/31/2023 with  mastoiditis .  Pharmacy has been consulted for vancomycin and zosyn dosing.  WBC remains WNL and pt remains afebrile SCr trending up to 1.35.  Plan: Continue Zosyn 3.375g IV q8h (4 hour infusion). Change Vancomycin to '1250mg'$  IV q48h (eAUC ~498)    > Goal AUC 400-550    > Check vancomycin levels at steady state  Monitor daily CBC, temp, SCr, and for clinical signs of improvement  F/u cultures and de-escalate antibiotics as able   Height: '5\' 3"'$  (160 cm) Weight: 88.6 kg (195 lb 5.2 oz) IBW/kg (Calculated) : 52.4  Temp (24hrs), Avg:98.2 F (36.8 C), Min:97.7 F (36.5 C), Max:98.9 F (37.2 C)  Recent Labs  Lab 01/31/23 1236 02/01/23 0127  WBC 7.5 7.9  CREATININE 0.96 1.35*     Estimated Creatinine Clearance: 36.9 mL/min (A) (by C-G formula based on SCr of 1.35 mg/dL (H)).    Allergies  Allergen Reactions   Adhesive [Tape] Rash    pls use elastic wraps instead of adhesive tape   Codeine Nausea And Vomiting   Empagliflozin Other (See Comments)    Headaches & lower back pain    Antimicrobials this admission: Zosyn 3/13 >>  Vancomycin 3/13 >>   Dose adjustments this admission: 3/14 - Vancomycin '1500mg'$  q24h > '1250mg'$  q48h  Microbiology results: None  Thank you for allowing pharmacy to be a part of this patient's care.  Luisa Hart, PharmD, BCPS Clinical Pharmacist 02/01/2023 1:41 PM   Please refer to AMION for pharmacy phone number

## 2023-02-01 NOTE — Hospital Course (Addendum)
[name] [name] is a 77 y.o. female with a history of TIA/CVA, hyperlipidemia, hypertension, obesity, GERD, diabetes mellitus, migraines, diastolic heart failure, ascending aortic aneurysm. Patient presented secondary to left ear/jaw pain and was found to have left mastoiditis with presence of left 1 cm abscess. Abscess not noted on repeat CT imaging. Empiric antibiotics started. ENT consulted with recommendations to treat as left otitis media; CT scan also significant for right maxillary sinusitis. Patient discharged on Augmentin and Ciprodex otic solution.

## 2023-02-02 LAB — GLUCOSE, CAPILLARY
Glucose-Capillary: 118 mg/dL — ABNORMAL HIGH (ref 70–99)
Glucose-Capillary: 130 mg/dL — ABNORMAL HIGH (ref 70–99)

## 2023-02-02 LAB — BASIC METABOLIC PANEL
Anion gap: 7 (ref 5–15)
BUN: 16 mg/dL (ref 8–23)
CO2: 24 mmol/L (ref 22–32)
Calcium: 8.7 mg/dL — ABNORMAL LOW (ref 8.9–10.3)
Chloride: 107 mmol/L (ref 98–111)
Creatinine, Ser: 1.1 mg/dL — ABNORMAL HIGH (ref 0.44–1.00)
GFR, Estimated: 52 mL/min — ABNORMAL LOW (ref >60)
Glucose, Bld: 135 mg/dL — ABNORMAL HIGH (ref 70–99)
Potassium: 3.9 mmol/L (ref 3.5–5.1)
Sodium: 138 mmol/L (ref 135–145)

## 2023-02-02 MED ORDER — VANCOMYCIN HCL 750 MG/150ML IV SOLN
750.0000 mg | INTRAVENOUS | Status: DC
  Filled 2023-02-02: qty 150

## 2023-02-02 MED ORDER — CIPROFLOXACIN-DEXAMETHASONE 0.3-0.1 % OT SUSP: 4.0000 [drp] | Freq: Two times a day (BID) | OTIC | 0 refills | Status: AC

## 2023-02-02 MED ORDER — LISINOPRIL 10 MG PO TABS: 10.0000 mg | ORAL_TABLET | Freq: Every day | ORAL | Status: DC

## 2023-02-02 MED ORDER — AMOXICILLIN-POT CLAVULANATE 875-125 MG PO TABS: 1.0000 | ORAL_TABLET | Freq: Two times a day (BID) | ORAL | 0 refills | Status: AC

## 2023-02-02 NOTE — Discharge Summary (Signed)
Physician Discharge Summary   Patient: [name] [name] MRN: [id number] DOB: 1946-02-22  Admit date:     01/31/2023  Discharge date: 02/02/23  Discharge Physician: Cordelia Poche, MD   PCP: Delana Meyer, Cory Roughen, MD   Recommendations at discharge:  PCP follow-up ENT follow-up Oral and otic antibiotics, 10 day total course  Discharge Diagnoses: Principal Problem:   Mastoiditis, acute, left Active Problems:   Diabetes mellitus with coincident hypertension ([place])   Essential hypertension   GERD (gastroesophageal reflux disease)   Obesity due to excess calories   Chronic diastolic heart failure, NYHA class 2 ([place])   History of TIA (transient ischemic attack)   Pure hypercholesterolemia   History of stroke  Resolved Problems:   * No resolved hospital problems. [facility] Course: [place] [name] [name] is a 77 y.o. female with a history of TIA/CVA, hyperlipidemia, hypertension, obesity, GERD, diabetes mellitus, migraines, diastolic heart failure, ascending aortic aneurysm. Patient presented secondary to left ear/jaw pain and was found to have left mastoiditis with presence of left 1 cm abscess. Abscess not noted on repeat CT imaging. Empiric antibiotics started. ENT consulted with recommendations to treat as left otitis media; CT scan also significant for right maxillary sinusitis. Patient discharged on Augmentin and Ciprodex otic solution.  Assessment and Plan: Mastoiditis Otitis media, left Patient with neck/facial swelling and pain. CT maxillofacial significant for 11 m fluid collection along superficial aspect of the mastoid air cells. Patient was empirically managed on Vancomycin and Zosyn. Discussed with ENT and unlikely abscess. Recommendation for Ciprodex otic drops and ENT follow-up.   AKI Baseline creatinine of about 1. Creatinine worsened to 1.35 likely secondary to poor oral intake. Complicated by lisinopril use. Improved with IV fluids.    Maxillary sinusitis Chronic per  radiology read. ENT recommending Augmentin to complete a 10 day course of antibiotics.   Primary hypertension Continue metoprolol and lisinopril on discharge.   Hypokalemia Mild. Potassium supplementation given. Resolved.   Chronic diastolic heart failure Stable.   History of TIA Hyperlipidemia Continue Lipitor   GERD Continue Prilosec.   Diabetes mellitus type 2 Controlled from most recent hemoglobin A1C of 7.3% from 05/2021. Patient is managed on glimepiride, linagliptin and Wilder Glade as an outpatient which are held on admission.    Obestiy Estimated body mass index is 34.6 kg/m as calculated from the following:   Height as of this encounter: 5\' 3"  (1.6 m).   Weight as of this encounter: 88.6 kg.  Consultants: ENT Procedures performed: None  Disposition: Home Diet recommendation: Carb modified diet   DISCHARGE MEDICATION: Allergies as of 02/02/2023       Reactions   Adhesive [tape] Rash   pls use elastic wraps instead of adhesive tape   Codeine Nausea And Vomiting   Empagliflozin Other (See Comments)   Headaches & lower back pain        Medication List     STOP taking these medications    ofloxacin 0.3 % OTIC solution Commonly known as: FLOXIN       TAKE these medications    acetaminophen 500 MG tablet Commonly known as: TYLENOL Take 1 tablet (500 mg total) by mouth every 6 (six) hours as needed. What changed: reasons to take this   albuterol 108 (90 Base) MCG/ACT inhaler Commonly known as: VENTOLIN HFA Inhale 1-2 puffs into the lungs every 6 (six) hours as needed for wheezing.   amoxicillin-clavulanate 875-125 MG tablet Commonly known as: AUGMENTIN Take 1 tablet by mouth 2 (two) times daily  for 8 days.   aspirin EC 81 MG tablet Take 81 mg by mouth daily.   atorvastatin 10 MG tablet Commonly known as: LIPITOR Take 10 mg by mouth daily.   benzonatate 100 MG capsule Commonly known as: TESSALON Take 1 capsule (100 mg total) by mouth every 8  (eight) hours as needed for cough.   ciprofloxacin-dexamethasone OTIC suspension Commonly known as: CIPRODEX Place 4 drops into the left ear 2 (two) times daily for 10 days.   dapagliflozin propanediol 5 MG Tabs tablet Commonly known as: FARXIGA Take 5 mg by mouth daily.   fluticasone 50 MCG/ACT nasal spray Commonly known as: FLONASE Place 2 sprays into both nostrils as needed for allergies.   glimepiride 2 MG tablet Commonly known as: AMARYL Take 2 mg by mouth daily.   linagliptin 5 MG Tabs tablet Commonly known as: TRADJENTA Take 5 mg by mouth daily.   lisinopril 10 MG tablet Commonly known as: ZESTRIL Take 1 tablet by mouth daily.   metoprolol succinate 50 MG 24 hr tablet Commonly known as: TOPROL-XL TAKE 1 TABLET BY MOUTH DAILY WITH OR IMMEDIATELY FOLLOWING A MEAL What changed:  how much to take how to take this when to take this additional instructions   nystatin cream Commonly known as: MYCOSTATIN Apply 1 Application topically daily at 6 (six) AM.   omeprazole 40 MG capsule Commonly known as: PRILOSEC Take 1 capsule by mouth daily.   PreserVision AREDS 2 Caps Take 1 tablet by mouth 2 (two) times daily.   triamcinolone ointment 0.5 % Commonly known as: KENALOG Apply 1 Application topically daily as needed (for rash).   Vitamin D3 25 MCG (1000 UT) capsule Generic drug: Cholecalciferol Take 1 capsule by mouth daily.        Follow-up Information     Delana Meyer, Cory Roughen, MD. Schedule an appointment as soon as possible for a visit in 1 week(s).   Specialty: Family Medicine Why: For hospital follow-up Contact information: [place] [id number] [phone]         [place]. Schedule an appointment as soon as possible for a visit in 1 week(s).   Why: For hospital follow-up Contact information: [address] [phone]               Discharge  Exam: BP (!) 181/86 (BP Location: Right Arm)   Pulse 61   Temp 97.7 F (36.5 C) (Oral)   Resp 17   Ht 5\' 3"  (1.6 m)   Wt 88.6 kg   SpO2 99%   BMI 34.60 kg/m   General exam: Appears calm and comfortable Respiratory system: Clear to auscultation. Respiratory effort normal. Cardiovascular system: S1 & S2 heard, RRR. Gastrointestinal system: Abdomen is nondistended, soft and nontender. Normal bowel sounds heard. Central nervous system: Alert and oriented. Musculoskeletal: No edema. No calf tenderness   Condition at discharge: stable  The results of significant diagnostics from this hospitalization (including imaging, microbiology, ancillary and laboratory) are listed below for reference.   Imaging Studies: CT Soft Tissue Neck W Contrast  Result Date: 01/31/2023 CLINICAL DATA:  Soft tissue infection EXAM: CT NECK WITH CONTRAST TECHNIQUE: Multidetector CT imaging of the neck was performed using the standard protocol following the bolus administration of intravenous contrast. RADIATION DOSE REDUCTION: This exam was performed according to the departmental dose-optimization program which includes automated exposure control, adjustment of the mA and/or kV according to patient size and/or use of  iterative reconstruction technique. CONTRAST:  16mL OMNIPAQUE IOHEXOL 350 MG/ML SOLN COMPARISON:  Maxillofacial CT 01/31/2023 FINDINGS: Pharynx and larynx: Normal. No mass or swelling. Salivary glands: No inflammation, mass, or stone. Thyroid: Normal. Lymph nodes: None enlarged or abnormal density. Vascular: Negative. Limited intracranial: Negative. Visualized orbits: Negative. Mastoids and visualized paranasal sinuses: Left mastoid effusion. Middle ear cavity is clear. No abscess or drainable fluid collection. Complete opacification of the right maxillary sinus Skeleton: No acute or aggressive process. Upper chest: Negative. Other: None. IMPRESSION: 1. Left mastoid effusion without coalescence.  There is no  abscess. 2. Complete opacification of the right maxillary sinus. Electronically Signed   By: Ulyses Jarred M.D.   On: 01/31/2023 19:47   CT Maxillofacial Wo Contrast  Result Date: 01/31/2023 CLINICAL DATA:  mastoiditis EXAM: CT MAXILLOFACIAL WITHOUT CONTRAST TECHNIQUE: Multidetector CT imaging of the maxillofacial structures was performed. Multiplanar CT image reconstructions were also generated. RADIATION DOSE REDUCTION: This exam was performed according to the departmental dose-optimization program which includes automated exposure control, adjustment of the mA and/or kV according to patient size and/or use of iterative reconstruction technique. COMPARISON:  None Available. FINDINGS: Osseous: No fracture or mandibular dislocation. No destructive process. Orbits: Negative. No traumatic or inflammatory finding. Sinuses: Air-fluid level and partial opacification in the right maxillary sinus with areas of internal mineralization and surrounding bony osteitis. Minimal fluid in the inferior left maxillary sinus and mild ethmoid air cell mucosal thickening. Remaining sinuses are largely clear. Soft tissues: Small (11 x 2 x 5 mm) peripherally enhancing fluid collection superficial to the left mastoid air cells (see series 3, image 62 and series 9, image 83). Mild surrounding edema. Other: Moderate left mastoid effusion. Bony mastoid septa appear intact. Limited intracranial: No significant or unexpected finding. IMPRESSION: 1. Moderate left mastoid effusion, compatible with reported mastoiditis. Small adjacent abscess (11 mm) along the superficial aspect of the mastoid air cells, compatible with abscess ( "Bezold" abcess). 2. 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 Electronically Signed   By: Margaretha Sheffield M.D.   On: 01/31/2023 13:39    Microbiology: Results for orders placed or performed during the hospital encounter of 06/09/21  SARS CORONAVIRUS 2 (TAT 6-24 HRS) Nasopharyngeal  Nasopharyngeal Swab     Status: None   Collection Time: 06/10/21  2:55 AM   Specimen: Nasopharyngeal Swab  Result Value Ref Range Status   SARS Coronavirus 2 NEGATIVE NEGATIVE Final    Comment: (NOTE) SARS-CoV-2 target nucleic acids are NOT DETECTED.  The SARS-CoV-2 RNA is generally detectable in upper and lower respiratory specimens during the acute phase of infection. Negative results do not preclude SARS-CoV-2 infection, do not rule out co-infections with other pathogens, and should not be used as the sole basis for treatment or other patient management decisions. Negative results must be combined with clinical observations, patient history, and epidemiological information. The expected result is Negative.  Fact Sheet for Patients: SugarRoll.be  Fact Sheet for Healthcare Providers: https://www.woods-mathews.com/  This test is not yet approved or cleared by the [place] FDA and  has been authorized for detection and/or diagnosis of SARS-CoV-2 by FDA under an Emergency Use Authorization (EUA). This EUA will remain  in effect (meaning this test can be used) for the duration of the COVID-19 declaration under Se ction 564(b)(1) of the Act, 21 U.S.C. section 360bbb-3(b)(1), unless the authorization is terminated or revoked sooner.  Performed at [facility], [place] [address] [place], [place] [address]     Labs: CBC: Recent  Labs  Lab 01/31/23 1236 02/01/23 0127  WBC 7.5 7.9  NEUTROABS 4.9  --   HGB 14.3 13.2  HCT 44.9 41.8  MCV 92.0 93.3  PLT 297 [id number]   Basic Metabolic Panel: Recent Labs  Lab 01/31/23 1236 02/01/23 0127 02/02/23 0332  NA 139 137 138  K 3.3* 3.4* 3.9  CL 106 107 107  CO2 24 22 24   GLUCOSE 131* 191* 135*  BUN 14 18 16   CREATININE 0.96 1.35* 1.10*  CALCIUM 9.1 8.8* 8.7*  MG 1.9  --   --    Liver Function Tests: Recent Labs  Lab 01/31/23 1236 02/01/23 0127  AST 12* 13*  ALT 11 9  ALKPHOS  128* 108  BILITOT 0.3 0.7  PROT 7.5 6.2*  ALBUMIN 3.5 2.8*   CBG: Recent Labs  Lab 02/01/23 1146 02/01/23 1211 02/01/23 1646 02/01/23 2134 02/02/23 0708  GLUCAP 138* 118* 147* 137* 118*    Discharge time spent: 35 minutes.  Signed: Cordelia Poche, MD [facility] 02/02/2023

## 2023-02-02 NOTE — Discharge Instructions (Signed)
[place] were in the hospital with an infection of your left ear/base of your skull. There was initially a concern for an abscess but this appears to not be the case. You have improved with antibiotics. The ENT doctor wants you to continue oral antibiotics in addition to adding a new ear drop for your ear infection. Please follow-up with an ear/nose/throat (ENT) doctor and your PCP.

## 2023-03-07 ENCOUNTER — Encounter: Payer: Self-pay | Admitting: Podiatry

## 2023-03-07 ENCOUNTER — Ambulatory Visit (INDEPENDENT_AMBULATORY_CARE_PROVIDER_SITE_OTHER): Payer: Medicare Other | Admitting: Podiatry

## 2023-03-07 VITALS — BP 206/93 | HR 56

## 2023-03-07 DIAGNOSIS — M79675 Pain in left toe(s): Secondary | ICD-10-CM

## 2023-03-07 DIAGNOSIS — M79674 Pain in right toe(s): Secondary | ICD-10-CM

## 2023-03-07 DIAGNOSIS — B351 Tinea unguium: Secondary | ICD-10-CM | POA: Diagnosis not present

## 2023-03-07 DIAGNOSIS — E1142 Type 2 diabetes mellitus with diabetic polyneuropathy: Secondary | ICD-10-CM | POA: Diagnosis not present

## 2023-03-07 NOTE — Progress Notes (Signed)
This patient returns to my office for at risk foot care.  This patient requires this care by a professional since this patient will be at risk due to having diabetic neuropathy.  This patient is unable to cut nails herself since the patient cannot reach her nails.These nails are painful walking and wearing shoes.  This patient presents for at risk foot care today.  General Appearance  Alert, conversant and in no acute stress.  Vascular  Dorsalis pedis and posterior tibial  pulses are palpable  bilaterally.  Capillary return is within normal limits  bilaterally. Temperature is within normal limits  bilaterally.  Neurologic  Senn-Weinstein monofilament wire test diminished  bilaterally. Muscle power within normal limits bilaterally.  Nails Thick disfigured discolored nails with subungual debris  from hallux to fifth toes bilaterally. No evidence of bacterial infection or drainage bilaterally.  Orthopedic  No limitations of motion  feet .  No crepitus or effusions noted.  Plantar flexed third and fourth mets left foot.  Skin  normotropic skin with no porokeratosis noted bilaterally.  No signs of infections or ulcers noted.  Skin callus sub 5th metabase left foot.   Onychomycosis  Pain in right toes  Pain in left toes  Consent was obtained for treatment procedures.   Mechanical debridement of nails 1-5  bilaterally performed with a nail nipper.  Filed with dremel without incident. Patient is measured for diabetic shoes.   Return office visit   3 months                   Told patient to return for periodic foot care and evaluation due to potential at risk complications.   Jamari Diana DPM   

## 2023-05-09 NOTE — Progress Notes (Unsigned)
Office Visit    Patient Name: [name] [name] Date of Encounter: 05/10/2023  PCP:  Corliss Blacker, MD   [facility]  Cardiologist:  Donato Schultz, MD  Advanced Practice Provider:  No care team member to display Electrophysiologist:  None   HPI    [name] [name] is a 77 y.o. female with a past medical history of dilated ascending aorta, prior stroke with residual left-sided weakness, diabetes, and hyperlipidemia presents today for follow-up visit.  She was admitted 05/2021 with chest pain.  Underwent left heart catheterization that showed widely patent coronary arteries with only minimal luminal irregularities in the LAD and circumflex.  Echo showed EF 55 to 60%, G1 DD, mild MR.  She was last seen 12/09/2021 by Dr. Anne Fu at which time no changes were made and 1 year follow-up was advised.  She was last seen in our office 04/13/2022 and received referral from PCP for evaluation.  The patient at the time was unsure of the exact reason but was advised to come in for sooner follow-up.  Reported more chest pain recently over approximately 3 months but upon further discussion was describing more difficulty breathing when laying on left side and palpitations that she felt most often when laying down.  Felt better when she turned on her right side and elevated her head slightly.  Occasionally felt palpitations during the day.  Some shortness of breath when working in her yard.  Occasional lightheadedness.  No syncope or presyncope.  Cardiac monitor revealed sinus rhythm 65 bpm on average, occasional short episode of atrial tachycardia, rare PAC/PVC, no atrial fibrillation or pauses.  She was seen in follow-up 07/06/2022 and at that time she reported dyspnea had improved.  She did not have any specific concerns.  Realizing that she cannot do some things that she previously could do such as moving furniture or going up and down ladders.  Enjoyed gardening.  Denied any cardiac  symptoms.  She was seen by me 01/22/2023, she states that she is under a lot of stress because her son is sick and his wife is not treating him well.  She keeps the family in the dark about a lot medical decisions.  Blood pressure is elevated today and was fine a few months ago.  She does get some fluttering in her chest when she gets really upset.  She does not sleep well at night because she constantly is worrying.  We discussed how the stress is affecting her health and her blood pressure.  I did suggest reaching out to Lasandra Beech to see if she could provide some resources to help remedy the situation.  Seen in the ED for acute mastoiditis, otitis media on the left 3/13.  Today, she tells me that he is actually taking 20 mg of lisinopril.  Blood pressure still in the 180s systolic over 100 diastolic.  She is having weakness in her left arm and palpitations 78.  She states that she has been having chest pains for 2 weeks.  She thought it was indigestion but it does get worse with activity.  No lower extremity swelling.  Some shortness of breath with activity but none at rest.  We discussed that a few options for an ischemic workup.  We have titrated her blood pressure medications and educated her on how to keep track daily.  She does tell me that she has lightheaded and dizzy at times but she does have a diagnosis of vertigo as  well.   No edema, orthopnea, PND.     Past Medical History    Past Medical History:  Diagnosis Date   Arthritis    Bronchitis    hx   Chest pain 2009   MV with no scar or ischemia, EF 65%   Complication of anesthesia    claustrophobic !   CVA (cerebral vascular accident) (HCC) 05/05/2012   Diabetes mellitus    GERD (gastroesophageal reflux disease)    History of cardiac catheterization    a. Myoview 2/15 with anteroapical ischemia, EF 64% (intermediate risk) >> LHC - normal cos, EF 55-60%   Hypertension    Hypokalemia 06/10/2021   Neuromuscular disorder (HCC)     arotic Aneurysm   Obesity (BMI 35.0-39.9 without comorbidity)    Pneumonia    Stroke [facility])    with some resid L sided weakness   Thoracic ascending aortic aneurysm (HCC)    a. CT 1/17: ascending aorta 4.2 x 4.0 cm   Past Surgical History:  Procedure Laterality Date   ABDOMINAL HYSTERECTOMY  2006   Partial, one ovary left    CARDIAC CATHETERIZATION  2004   [place], [place] per pt.   CHOLECYSTECTOMY     EAR CYST EXCISION Left 09/20/2016   Procedure: EXCISION  LEFT EAR LESION;  Surgeon: Newman Pies, MD;  Location: [facility] OR;  Service: ENT;  Laterality: Left;   LEFT HEART CATH AND CORONARY ANGIOGRAPHY N/A 06/10/2021   Procedure: LEFT HEART CATH AND CORONARY ANGIOGRAPHY;  Surgeon: Tonny Bollman, MD;  Location[facility] INVAS[facility] CV LAB;  Service: Cardiovascular;  Laterality: N/A;   LEFT HEART CATHETERIZATION WITH CORONARY ANGIOGRAM N/A 01/09/2014   Procedure: LEFT HEART CATHETERIZATION WITH CORONARY ANGIOGRAM;  Surgeon: Marykay Lex, MD;  Location: [facility] CATH LAB;  Service: Cardiovascular;  Laterality: N/A;   MENISCUS REPAIR Right    ROTATOR CUFF REPAIR Left    SKIN SPLIT GRAFT Left 09/20/2016   Procedure: SKIN GRAFT SPLIT THICKNESS;  Surgeon: Newman Pies, MD;  Location: [facility] OR;  Service: ENT;  Laterality: Left;   TEMPOROMANDIBULAR JOINT SURGERY     TONSILLECTOMY     TYMPANOSTOMY TUBE PLACEMENT Left 06/03/2022   WISDOM TOOTH EXTRACTION      Allergies  Allergies  Allergen Reactions   Adhesive [Tape] Rash    pls use elastic wraps instead of adhesive tape   Codeine Nausea And Vomiting   Empagliflozin Other (See Comments)    Headaches & lower back pain    EKGs/Labs/Other Studies Reviewed:   The following studies were reviewed today: Cardiac monitor 6/27   Sinus rhythm 65 bpm on average Occasional short episodes of atrial tachycardia-benign. Rare PACs, rare PVCs No atrial fibrillation no pauses no adverse arrhythmias.   LHC 06/10/21   Widely patent coronary arteries with minimal  irregularities in the LAD and LCx, angiographically normal left main and RCA. Normal LVEDP Tortuous right subclavian artery   Suspect non-cardiac chest pain     Echo 06/10/21    1. Compared to 02/09/20, findings are similar.   2. Left ventricular ejection fraction, by estimation, is 55 to 60%. The  left ventricle has normal function. The left ventricle has no regional  wall motion abnormalities. Left ventricular diastolic parameters are  consistent with Grade I diastolic  dysfunction (impaired relaxation).   3. Right ventricular systolic function is normal. The right ventricular  size is normal. There is mildly elevated pulmonary artery systolic  pressure.   4. The mitral valve is normal in structure. Mild  mitral valve  regurgitation. No evidence of mitral stenosis.   5. The aortic valve is tricuspid. Aortic valve regurgitation is trivial.  No aortic stenosis is present.   6. Aortic dilatation noted. There is mild dilatation of the ascending  aorta and of the aortic root, measuring 38 mm.   7. The inferior vena cava is normal in size with greater than 50%  respiratory variability, suggesting right atrial pressure of 3 mmHg.    Cardiac monitor 10/11/16   No atrial fibrillation No pauses Average heart rate 62 bpm   Reassuring monitor  EKG:  EKG is not ordered today.    Recent Labs: 01/31/2023: Magnesium 1.9 02/01/2023: ALT 9; Hemoglobin 13.2; Platelets 263 02/02/2023: BUN 16; Creatinine, Ser 1.10; Potassium 3.9; Sodium 138  Recent Lipid Panel    Component Value Date/Time   CHOL 113 06/10/2021 0422   TRIG 78 06/10/2021 0422   HDL 34 (L) 06/10/2021 0422   CHOLHDL 3.3 06/10/2021 0422   VLDL 16 06/10/2021 0422   LDLCALC 63 06/10/2021 0422    Home Medications   Current Meds  Medication Sig   albuterol (PROVENTIL HFA;VENTOLIN HFA) 108 (90 BASE) MCG/ACT inhaler Inhale 1-2 puffs into the lungs every 6 (six) hours as needed for wheezing.   aspirin EC 81 MG tablet Take 81 mg  by mouth daily.   atorvastatin (LIPITOR) 10 MG tablet Take 10 mg by mouth daily.   Cholecalciferol (VITAMIN D3) 1000 units CAPS Take 1 capsule by mouth daily.   dapagliflozin propanediol (FARXIGA) 5 MG TABS tablet Take 5 mg by mouth daily.   fluticasone (FLONASE) 50 MCG/ACT nasal spray Place 2 sprays into both nostrils as needed for allergies.   glimepiride (AMARYL) 2 MG tablet Take 2 mg by mouth daily.   isosorbide mononitrate (IMDUR) 30 MG 24 hr tablet Take 0.5 tablets (15 mg total) by mouth daily.   linagliptin (TRADJENTA) 5 MG TABS tablet Take 5 mg by mouth daily.   metoprolol succinate (TOPROL-XL) 50 MG 24 hr tablet TAKE 1 TABLET BY MOUTH DAILY WITH OR IMMEDIATELY FOLLOWING A MEAL (Patient taking differently: Take 50 mg by mouth daily.)   metoprolol tartrate (LOPRESSOR) 25 MG tablet Take 1 tablet (25 mg total) by mouth once for 1 dose. 2 hours prior to CT   Multiple Vitamins-Minerals (PRESERVISION AREDS 2) CAPS Take 1 tablet by mouth 2 (two) times daily.   omeprazole (PRILOSEC) 40 MG capsule Take 1 capsule by mouth daily.   triamcinolone ointment (KENALOG) 0.5 % Apply 1 Application topically daily as needed (for rash).   [DISCONTINUED] lisinopril (ZESTRIL) 10 MG tablet Take 1 tablet by mouth daily.   [DISCONTINUED] lisinopril (ZESTRIL) 40 MG tablet Take 1 tablet (40 mg total) by mouth daily.     Review of Systems      All other systems reviewed and are otherwise negative except as noted above.  Physical Exam    VS:  BP (!) 180/100   Pulse (!) 59   Ht 5\' 3"  (1.6 m)   Wt 185 lb 9.6 oz (84.2 kg)   SpO2 98%   BMI 32.88 kg/m  , BMI Body mass index is 32.88 kg/m.  Wt Readings from Last 3 Encounters:  05/10/23 185 lb 9.6 oz (84.2 kg)  02/01/23 195 lb 5.2 oz (88.6 kg)  01/22/23 189 lb (85.7 kg)     GEN: Well nourished, well developed, in no acute distress. HEENT: normal. Neck: Supple, no JVD, carotid bruits, or masses. Cardiac: RRR, no murmurs, rubs, or  gallops. No clubbing,  cyanosis, edema.  Radials/PT 2+ and equal bilaterally.  Respiratory:  Respirations regular and unlabored, clear to auscultation bilaterally. GI: Soft, nontender, nondistended. MS: No deformity or atrophy. Skin: Warm and dry, no rash. Neuro:  Strength and sensation are intact. Psych: Normal affect.  Assessment & Plan    Chest pain/SOB -Plan for coronary CTA and aorta CTA at the same time -Imdur 15 mg daily, nitroglycerin as needed -continue current medications  Essential hypertension -180/100 -Increase lisinopril to 40 mg daily and continue metoprolol succinate 50 mg daily -Add Imdur 15 g daily with likelihood of needing to increase -We discussed her checking her blood pressure at home daily an hour after morning medications and recording.  Please bring these values with you to your next appointment.   Hyperlipidemia, goal less than 70 -Continue Lipitor 10 mg daily -Last LDL 63 -Will need updated labs from PCP  Aortic  root dilation  -Mild, 38 mm -Extremely important to monitor blood pressure closely  Heart palpitations  -Only occurs when she is upset -Certainly if they become more frequent could consider a heart monitor in the future   HYPERTENSION CONTROL Vitals:   05/10/23 1032 05/10/23 1128  BP: (!) 188/104 (!) 180/100    The patient's blood pressure is elevated above target today.  In order to address the patient's elevated BP: A new medication was prescribed today.         Disposition: Follow up 2 weeks with Donato Schultz, MD or APP.  Signed, Sharlene Dory, PA-C 05/10/2023, 4:16 PM [facility]

## 2023-05-10 ENCOUNTER — Encounter: Payer: Self-pay | Admitting: Physician Assistant

## 2023-05-10 ENCOUNTER — Ambulatory Visit: Payer: Medicare Other | Attending: Physician Assistant | Admitting: Physician Assistant

## 2023-05-10 VITALS — BP 180/100 | HR 59 | Ht 63.0 in | Wt 185.6 lb

## 2023-05-10 DIAGNOSIS — R002 Palpitations: Secondary | ICD-10-CM | POA: Diagnosis not present

## 2023-05-10 DIAGNOSIS — I7121 Aneurysm of the ascending aorta, without rupture: Secondary | ICD-10-CM

## 2023-05-10 DIAGNOSIS — I10 Essential (primary) hypertension: Secondary | ICD-10-CM | POA: Diagnosis not present

## 2023-05-10 DIAGNOSIS — R072 Precordial pain: Secondary | ICD-10-CM

## 2023-05-10 DIAGNOSIS — E785 Hyperlipidemia, unspecified: Secondary | ICD-10-CM

## 2023-05-10 LAB — BASIC METABOLIC PANEL
BUN/Creatinine Ratio: 13 (ref 12–28)
BUN: 16 mg/dL (ref 8–27)
CO2: 26 mmol/L (ref 20–29)
Calcium: 9.4 mg/dL (ref 8.7–10.3)
Chloride: 104 mmol/L (ref 96–106)
Creatinine, Ser: 1.26 mg/dL — ABNORMAL HIGH (ref 0.57–1.00)
Glucose: 138 mg/dL — ABNORMAL HIGH (ref 70–99)
Potassium: 4.7 mmol/L (ref 3.5–5.2)
Sodium: 142 mmol/L (ref 134–144)
eGFR: 44 mL/min/{1.73_m2} — ABNORMAL LOW (ref >59)

## 2023-05-10 MED ORDER — LISINOPRIL 40 MG PO TABS: 40.0000 mg | ORAL_TABLET | Freq: Every day | ORAL | 3 refills | Status: AC

## 2023-05-10 MED ORDER — LISINOPRIL 40 MG PO TABS: 40.0000 mg | ORAL_TABLET | Freq: Every day | ORAL | 3 refills | Status: DC

## 2023-05-10 MED ORDER — METOPROLOL TARTRATE 25 MG PO TABS: 25.0000 mg | ORAL_TABLET | Freq: Once | ORAL | 0 refills | Status: DC

## 2023-05-10 MED ORDER — ISOSORBIDE MONONITRATE ER 30 MG PO TB24: 15.0000 mg | ORAL_TABLET | Freq: Every day | ORAL | 3 refills | Status: DC

## 2023-05-10 NOTE — Patient Instructions (Addendum)
Medication Instructions:  1.Start Imdur 15 mg daily (this will be 1/2 of a 30 mg tablet) 2.Increase lisinopril to 40 mg daily *If you need a refill on your cardiac medications before your next appointment, please call your pharmacy*   Lab Work: BMET-TODAY If you have labs (blood work) drawn today and your tests are completely normal, you will receive your results only by: MyChart Message (if you have MyChart) OR A paper copy in the mail If you have any lab test that is abnormal or we need to change your treatment, we will call you to review the results.   Testing/Procedures: Your provider has recommended that you have a CTA to look at your aorta.    Your cardiac CT will be scheduled at the below location:   [facility] [address] [place], [place] [address] [phone]  If scheduled at [facility], please arrive at the [facility] and Children's Entrance (Entrance C2) of [facility] 30 minutes prior to test start time. You can use the FREE valet parking offered at entrance C (encouraged to control the heart rate for the test)  Proceed to the [facility] Radiology Department (first floor) to check-in and test prep.  All radiology patients and guests should use entrance C2 at [facility], accessed from [facility], even though the hospital's physical address listed is [address]    If scheduled at [facility] or [facility], please arrive 15 mins early for check-in and test prep.   Please follow these instructions carefully (unless otherwise directed):  Hold all erectile dysfunction medications at least 3 days (72 hrs) prior to test. (Ie viagra, cialis, sildenafil, tadalafil, etc) We will administer nitroglycerin during this exam.   On the Night Before the Test: Be sure to Drink plenty of water. Do not consume any caffeinated/decaffeinated beverages or chocolate 12  hours prior to your test. Do not take any antihistamines 12 hours prior to your test. If the patient has contrast allergy: Patient will need a prescription for Prednisone and very clear instructions (as follows): Prednisone 50 mg - take 13 hours prior to test Take another Prednisone 50 mg 7 hours prior to test Take another Prednisone 50 mg 1 hour prior to test Take Benadryl 50 mg 1 hour prior to test Patient must complete all four doses of above prophylactic medications. Patient will need a ride after test due to Benadryl.  On the Day of the Test: Drink plenty of water until 1 hour prior to the test. Do not eat any food 1 hour prior to test. You may take your regular medications prior to the test.  Take metoprolol tartrate (Lopressor) 25 mg two hours prior to test. If you take Furosemide/Hydrochlorothiazide/Spironolactone, please HOLD on the morning of the test. FEMALES- please wear underwire-free bra if available, avoid dresses & tight clothing   After the Test: Drink plenty of water. After receiving IV contrast, you may experience a mild flushed feeling. This is normal. On occasion, you may experience a mild rash up to 24 hours after the test. This is not dangerous. If this occurs, you can take Benadryl 25 mg and increase your fluid intake. If you experience trouble breathing, this can be serious. If it is severe call 911 IMMEDIATELY. If it is mild, please call our office. If you take any of these medications: Glipizide/Metformin, Avandament, Glucavance, please do not take 48 hours after completing test unless otherwise instructed.  We will call to schedule your test 2-4 weeks out understanding that some insurance companies will need an authorization prior to the service being performed.   For non-scheduling related questions, please contact the cardiac imaging nurse navigator should you have any questions/concerns: Rockwell [place], Cardiac Imaging Nurse Navigator Larey Brick,  Cardiac Imaging Nurse Navigator [facility] Direct Office Dial: [phone]   For scheduling needs, including cancellations and rescheduling, please call [place], [phone].      Follow-Up: At [facility], you and your health needs are our priority.  As part of our continuing mission to provide you with exceptional heart care, we have created designated Provider Care Teams.  These Care Teams include your primary Cardiologist (physician) and Advanced Practice Providers (APPs -  Physician Assistants and Nurse Practitioners) who all work together to provide you with the care you need, when you need it.   Your next appointment:   3-4 week(s)  Provider:   Donato Schultz, MD  or APP  Other Instructions Check your blood pressure daily, 1 hr after morning medications for 2 weeks, keep a log and send [place] the readings through mychart at the end of the 2 weeks.   Low-Sodium Eating Plan Salt (sodium) helps you keep a healthy balance of fluids in your body. Too much sodium can raise your blood pressure. It can also cause fluid and waste to be held in your body. Your health care provider or dietitian may recommend a low-sodium eating plan if you have high blood pressure (hypertension), kidney disease, liver disease, or heart failure. Eating less sodium can help lower your blood pressure and reduce swelling. It can also protect your heart, liver, and kidneys. What are tips for following this plan? Reading food labels  Check food labels for the amount of sodium per serving. If you eat more than one serving, you must multiply the listed amount by the number of servings. Choose foods with less than 140 milligrams (mg) of sodium per serving. Avoid foods with 300 mg of sodium or more per serving. Always check how much sodium is in a product, even if the label says "unsalted" or "no salt added." Shopping  Buy products labeled as "low-sodium" or "no salt added." Buy  fresh foods. Avoid canned foods and pre-made or frozen meals. Avoid canned, cured, or processed meats. Buy breads that have less than 80 mg of sodium per slice. Cooking  Eat more home-cooked food. Try to eat less restaurant, buffet, and fast food. Try not to add salt when you cook. Use salt-free seasonings or herbs instead of table salt or sea salt. Check with your provider or pharmacist before using salt substitutes. Cook with plant-based oils, such as canola, sunflower, or olive oil. Meal planning When eating at a restaurant, ask if your food can be made with less salt or no salt. Avoid dishes labeled as brined, pickled, cured, or smoked. Avoid dishes made with soy sauce, miso, or teriyaki sauce. Avoid foods that have monosodium glutamate (MSG) in them. MSG may be added to some restaurant food, sauces, soups, bouillon, and canned foods. Make meals that can be grilled, baked, poached, roasted, or steamed. These are often made with less sodium. General information Try to limit your sodium intake to 1,500-2,300 mg each day, or the amount told by your provider. What foods should I eat? Fruits Fresh, frozen, or canned fruit. Fruit juice. Vegetables Fresh or frozen vegetables. "No salt added" canned vegetables. "No salt added" tomato sauce and  paste. Low-sodium or reduced-sodium tomato and vegetable juice. Grains Low-sodium cereals, such as oats, puffed wheat and rice, and shredded wheat. Low-sodium crackers. Unsalted rice. Unsalted pasta. Low-sodium bread. Whole grain breads and whole grain pasta. Meats and other proteins Fresh or frozen meat, poultry, seafood, and fish. These should have no added salt. Low-sodium canned tuna and salmon. Unsalted nuts. Dried peas, beans, and lentils without added salt. Unsalted canned beans. Eggs. Unsalted nut butters. Dairy Milk. Soy milk. Cheese that is naturally low in sodium, such as ricotta cheese, fresh mozzarella, or Swiss cheese. Low-sodium or  reduced-sodium cheese. Cream cheese. Yogurt. Seasonings and condiments Fresh and dried herbs and spices. Salt-free seasonings. Low-sodium mustard and ketchup. Sodium-free salad dressing. Sodium-free light mayonnaise. Fresh or refrigerated horseradish. Lemon juice. Vinegar. Other foods Homemade, reduced-sodium, or low-sodium soups. Unsalted popcorn and pretzels. Low-salt or salt-free chips. The items listed above may not be all the foods and drinks you can have. Talk to a dietitian to learn more. What foods should I avoid? Vegetables Sauerkraut, pickled vegetables, and relishes. Olives. [place] fries. Onion rings. Regular canned vegetables, except low-sodium or reduced-sodium items. Regular canned tomato sauce and paste. Regular tomato and vegetable juice. Frozen vegetables in sauces. Grains Instant hot cereals. Bread stuffing, pancake, and biscuit mixes. Croutons. Seasoned rice or pasta mixes. Noodle soup cups. Boxed or frozen macaroni and cheese. Regular salted crackers. Self-rising flour. Meats and other proteins Meat or fish that is salted, canned, smoked, spiced, or pickled. Precooked or cured meat, such as sausages or meat loaves. Tomasa Blase. Ham. Pepperoni. Hot dogs. Corned beef. Chipped beef. Salt pork. Jerky. Pickled herring, anchovies, and sardines. Regular canned tuna. Salted nuts. Dairy Processed cheese and cheese spreads. Hard cheeses. Cheese curds. Blue cheese. Feta cheese. String cheese. Regular cottage cheese. Buttermilk. Canned milk. Fats and oils Salted butter. Regular margarine. Ghee. Bacon fat. Seasonings and condiments Onion salt, garlic salt, seasoned salt, table salt, and sea salt. Canned and packaged gravies. Worcestershire sauce. Tartar sauce. Barbecue sauce. Teriyaki sauce. Soy sauce, including reduced-sodium soy sauce. Steak sauce. Fish sauce. Oyster sauce. Cocktail sauce. Horseradish that you find on the shelf. Regular ketchup and mustard. Meat flavorings and tenderizers.  Bouillon cubes. Hot sauce. Pre-made or packaged marinades. Pre-made or packaged taco seasonings. Relishes. Regular salad dressings. Salsa. Other foods Salted popcorn and pretzels. Corn chips and puffs. Potato and tortilla chips. Canned or dried soups. Pizza. Frozen entrees and pot pies. The items listed above may not be all the foods and drinks you should avoid. Talk to a dietitian to learn more. This information is not intended to replace advice given to you by your health care provider. Make sure you discuss any questions you have with your health care provider. Document Revised: 11/23/2022 Document Reviewed: 11/23/2022 Elsevier Patient Education  2024 [employer]   Heart-Healthy Eating Plan Many factors influence your heart health, including eating and exercise habits. Heart health is also called coronary health. Coronary risk increases with abnormal blood fat (lipid) levels. A heart-healthy eating plan includes limiting unhealthy fats, increasing healthy fats, limiting salt (sodium) intake, and making other diet and lifestyle changes. What is my plan? Your health care provider may recommend that: You limit your fat intake to _________% or less of your total calories each day. You limit your saturated fat intake to _________% or less of your total calories each day. You limit the amount of cholesterol in your diet to less than _________ mg per day. You limit the amount of sodium in your diet  to less than _________ mg per day. What are tips for following this plan? Cooking Cook foods using methods other than frying. Baking, boiling, grilling, and broiling are all good options. Other ways to reduce fat include: Removing the skin from poultry. Removing all visible fats from meats. Steaming vegetables in water or broth. Meal planning  At meals, imagine dividing your plate into fourths: Fill one-half of your plate with vegetables and green salads. Fill one-fourth of your plate with whole  grains. Fill one-fourth of your plate with lean protein foods. Eat 2-4 cups of vegetables per day. One cup of vegetables equals 1 cup (91 g) broccoli or cauliflower florets, 2 medium carrots, 1 large bell pepper, 1 large sweet potato, 1 large tomato, 1 medium white potato, 2 cups (150 g) raw leafy greens. Eat 1-2 cups of fruit per day. One cup of fruit equals 1 small apple, 1 large banana, 1 cup (237 g) mixed fruit, 1 large orange,  cup (82 g) dried fruit, 1 cup (240 mL) 100% fruit juice. Eat more foods that contain soluble fiber. Examples include apples, broccoli, carrots, beans, peas, and barley. Aim to get 25-30 g of fiber per day. Increase your consumption of legumes, nuts, and seeds to 4-5 servings per week. One serving of dried beans or legumes equals  cup (90 g) cooked, 1 serving of nuts is  oz (12 almonds, 24 pistachios, or 7 walnut halves), and 1 serving of seeds equals  oz (8 g). Fats Choose healthy fats more often. Choose monounsaturated and polyunsaturated fats, such as olive and canola oils, avocado oil, flaxseeds, walnuts, almonds, and seeds. Eat more omega-3 fats. Choose salmon, mackerel, sardines, tuna, flaxseed oil, and ground flaxseeds. Aim to eat fish at least 2 times each week. Check food labels carefully to identify foods with trans fats or high amounts of saturated fat. Limit saturated fats. These are found in animal products, such as meats, butter, and cream. Plant sources of saturated fats include palm oil, palm kernel oil, and coconut oil. Avoid foods with partially hydrogenated oils in them. These contain trans fats. Examples are stick margarine, some tub margarines, cookies, crackers, and other baked goods. Avoid fried foods. General information Eat more home-cooked food and less restaurant, buffet, and fast food. Limit or avoid alcohol. Limit foods that are high in added sugar and simple starches such as foods made using white refined flour (white breads, pastries,  sweets). Lose weight if you are overweight. Losing just 5-10% of your body weight can help your overall health and prevent diseases such as diabetes and heart disease. Monitor your sodium intake, especially if you have high blood pressure. Talk with your health care provider about your sodium intake. Try to incorporate more vegetarian meals weekly. What foods should I eat? Fruits All fresh, canned (in natural juice), or frozen fruits. Vegetables Fresh or frozen vegetables (raw, steamed, roasted, or grilled). Green salads. Grains Most grains. Choose whole wheat and whole grains most of the time. Rice and pasta, including brown rice and pastas made with whole wheat. Meats and other proteins Lean, well-trimmed beef, veal, pork, and lamb. Chicken and [place] without skin. All fish and shellfish. Wild duck, rabbit, pheasant, and venison. Egg whites or low-cholesterol egg substitutes. Dried beans, peas, lentils, and tofu. Seeds and most nuts. Dairy Low-fat or nonfat cheeses, including ricotta and mozzarella. Skim or 1% milk (liquid, powdered, or evaporated). Buttermilk made with low-fat milk. Nonfat or low-fat yogurt. Fats and oils Non-hydrogenated (trans-free) margarines. Vegetable oils, including  soybean, sesame, sunflower, olive, avocado, peanut, safflower, corn, canola, and cottonseed. Salad dressings or mayonnaise made with a vegetable oil. Beverages Water (mineral or sparkling). Coffee and tea. Unsweetened ice tea. Diet beverages. Sweets and desserts Sherbet, gelatin, and fruit ice. Small amounts of dark chocolate. Limit all sweets and desserts. Seasonings and condiments All seasonings and condiments. The items listed above may not be a complete list of foods and beverages you can eat. Contact a dietitian for more options. What foods should I avoid? Fruits Canned fruit in heavy syrup. Fruit in cream or butter sauce. Fried fruit. Limit coconut. Vegetables Vegetables cooked in cheese,  cream, or butter sauce. Fried vegetables. Grains Breads made with saturated or trans fats, oils, or whole milk. Croissants. Sweet rolls. Donuts. High-fat crackers, such as cheese crackers and chips. Meats and other proteins Fatty meats, such as hot dogs, ribs, sausage, bacon, rib-eye roast or steak. High-fat deli meats, such as salami and bologna. Caviar. Domestic duck and goose. Organ meats, such as liver. Dairy Cream, sour cream, cream cheese, and creamed cottage cheese. Whole-milk cheeses. Whole or 2% milk (liquid, evaporated, or condensed). Whole buttermilk. Cream sauce or high-fat cheese sauce. Whole-milk yogurt. Fats and oils Meat fat, or shortening. Cocoa butter, hydrogenated oils, palm oil, coconut oil, palm kernel oil. Solid fats and shortenings, including bacon fat, salt pork, lard, and butter. Nondairy cream substitutes. Salad dressings with cheese or sour cream. Beverages Regular sodas and any drinks with added sugar. Sweets and desserts Frosting. Pudding. Cookies. Cakes. Pies. Milk chocolate or white chocolate. Buttered syrups. Full-fat ice cream or ice cream drinks. The items listed above may not be a complete list of foods and beverages to avoid. Contact a dietitian for more information. Summary Heart-healthy meal planning includes limiting unhealthy fats, increasing healthy fats, limiting salt (sodium) intake and making other diet and lifestyle changes. Lose weight if you are overweight. Losing just 5-10% of your body weight can help your overall health and prevent diseases such as diabetes and heart disease. Focus on eating a balance of foods, including fruits and vegetables, low-fat or nonfat dairy, lean protein, nuts and legumes, whole grains, and heart-healthy oils and fats. This information is not intended to replace advice given to you by your health care provider. Make sure you discuss any questions you have with your health care provider. Document Revised: 12/12/2021  Document Reviewed: 12/12/2021 Elsevier Patient Education  2024 [employer].

## 2023-05-14 ENCOUNTER — Emergency Department (HOSPITAL_COMMUNITY)
Admission: EM | Admit: 2023-05-14 | Discharge: 2023-05-15 | Disposition: A | Discharge status: Home / Self Care | Payer: Medicare Other | Attending: Emergency Medicine | Admitting: Emergency Medicine

## 2023-05-14 ENCOUNTER — Emergency Department (HOSPITAL_COMMUNITY): Payer: Medicare Other

## 2023-05-14 ENCOUNTER — Other Ambulatory Visit: Payer: Self-pay

## 2023-05-14 ENCOUNTER — Ambulatory Visit
Admission: EM | Admit: 2023-05-14 | Discharge: 2023-05-14 | Disposition: A | Discharge status: Home / Self Care | Payer: Medicare Other

## 2023-05-14 DIAGNOSIS — Z79899 Other long term (current) drug therapy: Secondary | ICD-10-CM | POA: Insufficient documentation

## 2023-05-14 DIAGNOSIS — Z7982 Long term (current) use of aspirin: Secondary | ICD-10-CM | POA: Diagnosis not present

## 2023-05-14 DIAGNOSIS — I10 Essential (primary) hypertension: Secondary | ICD-10-CM | POA: Diagnosis not present

## 2023-05-14 DIAGNOSIS — R42 Dizziness and giddiness: Secondary | ICD-10-CM | POA: Diagnosis not present

## 2023-05-14 DIAGNOSIS — H53141 Visual discomfort, right eye: Secondary | ICD-10-CM | POA: Diagnosis not present

## 2023-05-14 DIAGNOSIS — H538 Other visual disturbances: Secondary | ICD-10-CM

## 2023-05-14 DIAGNOSIS — G43109 Migraine with aura, not intractable, without status migrainosus: Secondary | ICD-10-CM

## 2023-05-14 DIAGNOSIS — R519 Headache, unspecified: Secondary | ICD-10-CM | POA: Diagnosis present

## 2023-05-14 LAB — CBC WITH DIFFERENTIAL/PLATELET
Abs Immature Granulocytes: 0.01 10*3/uL (ref 0.00–0.07)
Basophils Absolute: 0 10*3/uL (ref 0.0–0.1)
Basophils Relative: 1 %
Eosinophils Absolute: 0.2 10*3/uL (ref 0.0–0.5)
Eosinophils Relative: 3 %
HCT: 42.8 % (ref 36.0–46.0)
Hemoglobin: 13.4 g/dL (ref 12.0–15.0)
Immature Granulocytes: 0 %
Lymphocytes Relative: 41 %
Lymphs Abs: 2.6 10*3/uL (ref 0.7–4.0)
MCH: 28.7 pg (ref 26.0–34.0)
MCHC: 31.3 g/dL (ref 30.0–36.0)
MCV: 91.6 fL (ref 80.0–100.0)
Monocytes Absolute: 0.5 10*3/uL (ref 0.1–1.0)
Monocytes Relative: 7 %
Neutro Abs: 3.1 10*3/uL (ref 1.7–7.7)
Neutrophils Relative %: 48 %
Platelets: 248 10*3/uL (ref 150–400)
RBC: 4.67 MIL/uL (ref 3.87–5.11)
RDW: 14.6 % (ref 11.5–15.5)
WBC: 6.4 10*3/uL (ref 4.0–10.5)
nRBC: 0 % (ref 0.0–0.2)

## 2023-05-14 LAB — COMPREHENSIVE METABOLIC PANEL
ALT: 10 U/L (ref 0–44)
AST: 11 U/L — ABNORMAL LOW (ref 15–41)
Albumin: 3.6 g/dL (ref 3.5–5.0)
Alkaline Phosphatase: 111 U/L (ref 38–126)
Anion gap: 9 (ref 5–15)
BUN: 16 mg/dL (ref 8–23)
CO2: 21 mmol/L — ABNORMAL LOW (ref 22–32)
Calcium: 8.9 mg/dL (ref 8.9–10.3)
Chloride: 107 mmol/L (ref 98–111)
Creatinine, Ser: 1.23 mg/dL — ABNORMAL HIGH (ref 0.44–1.00)
GFR, Estimated: 45 mL/min — ABNORMAL LOW (ref >60)
Glucose, Bld: 108 mg/dL — ABNORMAL HIGH (ref 70–99)
Potassium: 3.5 mmol/L (ref 3.5–5.1)
Sodium: 137 mmol/L (ref 135–145)
Total Bilirubin: 0.7 mg/dL (ref 0.3–1.2)
Total Protein: 6.6 g/dL (ref 6.5–8.1)

## 2023-05-14 LAB — ETHANOL: Alcohol, Ethyl (B): <10 mg/dL (ref <10)

## 2023-05-14 LAB — TROPONIN I (HIGH SENSITIVITY)
Troponin I (High Sensitivity): 5 ng/L (ref <18)
Troponin I (High Sensitivity): 7 ng/L (ref <18)

## 2023-05-14 LAB — CBG MONITORING, ED: Glucose-Capillary: 86 mg/dL (ref 70–99)

## 2023-05-14 LAB — SEDIMENTATION RATE: Sed Rate: 16 mm/hr (ref 0–22)

## 2023-05-14 MED ORDER — FLUORESCEIN SODIUM 1 MG OP STRP
1.0000 | ORAL_STRIP | Freq: Once | OPHTHALMIC | Status: AC
  Administered 2023-05-14: 1 via OPHTHALMIC
  Filled 2023-05-14: qty 1

## 2023-05-14 MED ORDER — METOCLOPRAMIDE HCL 5 MG/ML IJ SOLN
5.0000 mg | Freq: Once | INTRAMUSCULAR | Status: AC
  Administered 2023-05-14: 5 mg via INTRAVENOUS
  Filled 2023-05-14: qty 2

## 2023-05-14 MED ORDER — SODIUM CHLORIDE 0.9 % IV BOLUS
500.0000 mL | Freq: Once | INTRAVENOUS | Status: AC
  Administered 2023-05-14: 500 mL via INTRAVENOUS

## 2023-05-14 MED ORDER — DIPHENHYDRAMINE HCL 50 MG/ML IJ SOLN
12.5000 mg | Freq: Once | INTRAMUSCULAR | Status: AC
  Administered 2023-05-14: 12.5 mg via INTRAVENOUS
  Filled 2023-05-14: qty 1

## 2023-05-14 MED ORDER — DIAZEPAM 5 MG/ML IJ SOLN
2.5000 mg | Freq: Once | INTRAMUSCULAR | Status: AC | PRN
  Administered 2023-05-14: 2.5 mg via INTRAVENOUS
  Filled 2023-05-14: qty 2

## 2023-05-14 MED ORDER — KETOROLAC TROMETHAMINE 15 MG/ML IJ SOLN
15.0000 mg | Freq: Once | INTRAMUSCULAR | Status: AC
  Administered 2023-05-14: 15 mg via INTRAVENOUS
  Filled 2023-05-14: qty 1

## 2023-05-14 MED ORDER — TETRACAINE HCL 0.5 % OP SOLN
2.0000 [drp] | Freq: Once | OPHTHALMIC | Status: AC
  Administered 2023-05-14: 2 [drp] via OPHTHALMIC
  Filled 2023-05-14: qty 4

## 2023-05-14 NOTE — ED Notes (Signed)
EMS called per instruction of J. Stacie Acres, NP.

## 2023-05-14 NOTE — ED Provider Notes (Signed)
[facility]    CSN: [id number] Arrival date & time: 05/14/23  1732      History   Chief Complaint Chief Complaint  Patient presents with   Headache    HPI [name] [name] is a 77 y.o. female presents for evaluation of headache.  Patient is accompanied by family.  Patient reports today around noon she developed a acute right-sided/temporal headache that has been persistent since that time.  States it is a pounding throbbing pain and currently rates her headache as a 10 out of 10 and does state it is the worst headache of her life.  It is associated with right eye blurry vision and right eye swelling as well as dizziness, nausea and feeling unsteady.  No chest pain or shortness of breath.  She does have a past medical history of CVA, diabetes, hypertension.  CVA does accompany some residual left-sided weakness.  Her blood pressure was also accelerated at 219/98 on intake.  She has taken her normal blood pressure medications which include metoprolol, Imdur, lisinopril.  She reports a remote history of migraines several years ago but states this does not feel like a typical migraine to her.  She has not taken any OTC medications for the symptoms.  No other concerns at this time.   Headache Associated symptoms: dizziness and nausea     Past Medical History:  Diagnosis Date   Arthritis    Bronchitis    hx   Chest pain 2009   MV with no scar or ischemia, EF 65%   Complication of anesthesia    claustrophobic !   CVA (cerebral vascular accident) (HCC) 05/05/2012   Diabetes mellitus    GERD (gastroesophageal reflux disease)    History of cardiac catheterization    a. Myoview 2/15 with anteroapical ischemia, EF 64% (intermediate risk) >> LHC - normal cos, EF 55-60%   Hypertension    Hypokalemia 06/10/2021   Neuromuscular disorder (HCC)    arotic Aneurysm   Obesity (BMI 35.0-39.9 without comorbidity)    Pneumonia    Stroke [facility])    with some resid L sided weakness    Thoracic ascending aortic aneurysm (HCC)    a. CT 1/17: ascending aorta 4.2 x 4.0 cm    Patient Active Problem List   Diagnosis Date Noted   Mastoiditis, acute, left 01/31/2023   Plantar flexed metatarsal bone of left foot 11/29/2022   History of stroke 12/09/2021   Aortic root dilation (HCC) 06/10/2021   Unstable angina (HCC) 06/09/2021   Diabetic polyneuropathy associated with type 2 diabetes mellitus (HCC) 01/11/2021   Pelvic relaxation due to cystocele, midline 07/15/2020   Encounter for fitting and adjustment of pessary 07/15/2020   Nontraumatic complete tear of left rotator cuff 01/22/2020   Chronic sinusitis    Complicated migraine    Pure hypercholesterolemia    History of TIA (transient ischemic attack)    Chest pain    Osteopenia 08/25/2016   Chronic diastolic heart failure, NYHA class 2 (HCC) 12/07/2015   Pulmonary HTN (HCC) 12/07/2015   Chronic chest pain 12/07/2015   Ascending aortic aneurysm 12/07/2015   Left sided numbness 12/07/2015   Breast density 11/30/2015   DDD (degenerative disc disease), lumbar 07/07/2015   Bradycardia 05/06/2012   Obesity due to excess calories 04/25/2012   Cystocele 03/22/2012   Diabetes mellitus with coincident hypertension (HCC) 03/22/2012   Essential hypertension 03/22/2012   GERD (gastroesophageal reflux disease) 03/22/2012    Past Surgical History:  Procedure Laterality Date  ABDOMINAL HYSTERECTOMY  2006   Partial, one ovary left    CARDIAC CATHETERIZATION  2004   [place], [place] per pt.   CHOLECYSTECTOMY     EAR CYST EXCISION Left 09/20/2016   Procedure: EXCISION  LEFT EAR LESION;  Surgeon: Newman Pies, MD;  Location: [facility] OR;  Service: ENT;  Laterality: Left;   LEFT HEART CATH AND CORONARY ANGIOGRAPHY N/A 06/10/2021   Procedure: LEFT HEART CATH AND CORONARY ANGIOGRAPHY;  Surgeon: Tonny Bollman, MD;  Location: [facility] INVASIVE CV LAB;  Service: Cardiovascular;  Laterality: N/A;   LEFT HEART CATHETERIZATION WITH CORONARY  ANGIOGRAM N/A 01/09/2014   Procedure: LEFT HEART CATHETERIZATION WITH CORONARY ANGIOGRAM;  Surgeon: Marykay Lex, MD;  Location: [facility] CATH LAB;  Service: Cardiovascular;  Laterality: N/A;   MENISCUS REPAIR Right    ROTATOR CUFF REPAIR Left    SKIN SPLIT GRAFT Left 09/20/2016   Procedure: SKIN GRAFT SPLIT THICKNESS;  Surgeon: Newman Pies, MD;  Location: [facility] OR;  Service: ENT;  Laterality: Left;   TEMPOROMANDIBULAR JOINT SURGERY     TONSILLECTOMY     TYMPANOSTOMY TUBE PLACEMENT Left 06/03/2022   WISDOM TOOTH EXTRACTION      OB History     Gravida  4   Para  4   Term  4   Preterm  0   AB  0   Living  4      SAB  0   IAB  0   Ectopic  0   Multiple  0   Live Births           Obstetric Comments  Delivered 4 large babies vaginally          Home Medications    Prior to Admission medications   Medication Sig Start Date End Date Taking? Authorizing Provider  aspirin EC 81 MG tablet Take 81 mg by mouth daily.   Yes [provider]  atorvastatin (LIPITOR) 10 MG tablet Take 10 mg by mouth daily. 08/05/20  Yes [provider]  Cholecalciferol (VITAMIN D3) 1000 units CAPS Take 1 capsule by mouth daily.   Yes [provider]  glimepiride (AMARYL) 2 MG tablet Take 2 mg by mouth daily. 03/17/22  Yes [provider]  isosorbide mononitrate (IMDUR) 30 MG 24 hr tablet Take 0.5 tablets (15 mg total) by mouth daily. 05/10/23  Yes Conte, Tessa N, PA-C  linagliptin (TRADJENTA) 5 MG TABS tablet Take 5 mg by mouth daily.   Yes [provider]  lisinopril (ZESTRIL) 40 MG tablet Take 1 tablet (40 mg total) by mouth daily. 05/10/23  Yes Conte, Tessa N, PA-C  metoprolol succinate (TOPROL-XL) 50 MG 24 hr tablet TAKE 1 TABLET BY MOUTH DAILY WITH OR IMMEDIATELY FOLLOWING A MEAL Patient taking differently: Take 50 mg by mouth daily. 04/13/22  Yes Swinyer, Zachary George, NP  albuterol (PROVENTIL HFA;VENTOLIN HFA) 108 (90 BASE) MCG/ACT inhaler Inhale 1-2 puffs  into the lungs every 6 (six) hours as needed for wheezing. 07/29/13   Reuben Likes, MD  dapagliflozin propanediol (FARXIGA) 5 MG TABS tablet Take 5 mg by mouth daily.    [provider]  fluticasone (FLONASE) 50 MCG/ACT nasal spray Place 2 sprays into both nostrils as needed for allergies. 04/09/22   [provider]  metoprolol tartrate (LOPRESSOR) 25 MG tablet Take 1 tablet (25 mg total) by mouth once for 1 dose. 2 hours prior to CT 05/10/23 05/10/23  Sharlene Dory, PA-C  Multiple Vitamins-Minerals (PRESERVISION AREDS 2) CAPS  Take 1 tablet by mouth 2 (two) times daily.    [provider]  omeprazole (PRILOSEC) 40 MG capsule Take 1 capsule by mouth daily. 08/09/22   [provider]  triamcinolone ointment (KENALOG) 0.5 % Apply 1 Application topically daily as needed (for rash). 06/21/21   [provider]    Family History Family History  Problem Relation Age of Onset   Heart disease Father    Valvular heart disease Father        aortic valve replacement   Pancreatitis Mother    Diabetes Maternal Grandmother    Heart disease Brother    Valvular heart disease Brother        aortic valve replacement   CAD Son        heart attack   Kidney disease Son     Social History Social History   Tobacco Use   Smoking status: Never   Smokeless tobacco: Never  Vaping Use   Vaping Use: Never used  Substance Use Topics   Alcohol use: No   Drug use: No     Allergies   Adhesive [tape], Codeine, and Empagliflozin   Review of Systems Review of Systems  Eyes:  Positive for visual disturbance.  Gastrointestinal:  Positive for nausea.  Neurological:  Positive for dizziness, light-headedness and headaches.     Physical Exam Triage Vital Signs ED Triage Vitals [05/14/23 1857]  Enc Vitals Group     BP (!) 219/98     Pulse Rate (!) 56     Resp 18     Temp 98.4 F (36.9 C)     Temp Source Oral     SpO2 96 %     Weight      Height      Head  Circumference      Peak Flow      Pain Score 10     Pain Loc      Pain Edu?      Excl. in GC?    No data found.  Updated Vital Signs BP (!) 219/98 (BP Location: Right Arm)   Pulse (!) 56   Temp 98.4 F (36.9 C) (Oral)   Resp 18   SpO2 96%   Visual Acuity Right Eye Distance:   Left Eye Distance:   Bilateral Distance:    Right Eye Near:   Left Eye Near:    Bilateral Near:     Physical Exam Vitals and nursing note reviewed.  Constitutional:      Appearance: Normal appearance. She is not toxic-appearing.     Comments: Patient appears uncomfortable  HENT:     Head: Normocephalic and atraumatic.     Right Ear: Tympanic membrane and ear canal normal.     Left Ear: Tympanic membrane and ear canal normal.  Eyes:     Conjunctiva/sclera:     Right eye: Right conjunctiva is not injected. No chemosis, exudate or hemorrhage.    Pupils: Pupils are equal, round, and reactive to light.     Comments: Mild swelling of right upper eyelid with intermittent tearing.  Cardiovascular:     Rate and Rhythm: Bradycardia present.  Pulmonary:     Effort: Pulmonary effort is normal.  Skin:    General: Skin is warm and dry.  Neurological:     General: No focal deficit present.     Mental Status: She is alert.     GCS: GCS eye subscore is 4. GCS verbal subscore is 5. GCS motor  subscore is 6.     Cranial Nerves: No facial asymmetry.     Motor: Pronator drift present.     Coordination: Romberg sign positive.  Psychiatric:        Mood and Affect: Mood normal.        Behavior: Behavior normal.      UC Treatments / Results  Labs (all labs ordered are listed, but only abnormal results are displayed) Labs Reviewed - No data to display  EKG   Radiology No results found.  Procedures Procedures (including critical care time)  Medications Ordered in UC Medications - No data to display  Initial Impression / Assessment and Plan / UC Course  I have reviewed the triage vital signs and  the nursing notes.  Pertinent labs & imaging results that were available during my care of the patient were reviewed by me and considered in my medical decision making (see chart for details).     I discussed symptoms, exam with patient and her family.  Given her acute headache that is a 10 out of 10, accelerated blood pressure, and symptoms with her medical history advised EMS transfer to the ER for further evaluation and workup.  Patient is in agreement with plan and was taken via EMS to the emergency room. Final Clinical Impressions(s) / UC Diagnoses   Final diagnoses:  Acute intractable headache, unspecified headache type  Dizziness  Blurry vision, right eye  Accelerated hypertension     Discharge Instructions      Patient taken to the emergency room via EMS    ED Prescriptions   None    PDMP not reviewed this encounter.   Radford Pax, NP 05/14/23 [phone]

## 2023-05-14 NOTE — Discharge Instructions (Signed)
Patient taken to the emergency room via EMS

## 2023-05-14 NOTE — ED Provider Notes (Signed)
[place] EMERGENCY DEPARTMENT AT [facility] Provider Note   CSN: [id number] Arrival date & time: 05/14/23  2008     History  Chief Complaint  Patient presents with   Headache    [name] [name] is a 77 y.o. female.  HPI 77 year old female presents with severe headache.  She was at urgent care and transferred here via EMS.  Patient states last night she developed a headache on the right side.  Got a little worse at 3 AM.  Got significantly worse around noon and is primarily right temporal.  Her right eye also hurts.  The light bothers her right eye and she has blurry vision out of that eye.  She has been dealing with left arm weakness for weeks and seems to come and go with chest discomfort.  Is not currently having any chest pain.  She also has noticed some left leg weakness that she woke up this morning.  Pain is currently a 10/10.  She reports that she took all of her blood pressure medicines as normal today. BP on arrival is 202/91.  Home Medications Prior to Admission medications   Medication Sig Start Date End Date Taking? Authorizing Provider  albuterol (PROVENTIL HFA;VENTOLIN HFA) 108 (90 BASE) MCG/ACT inhaler Inhale 1-2 puffs into the lungs every 6 (six) hours as needed for wheezing. 07/29/13   Reuben Likes, MD  aspirin EC 81 MG tablet Take 81 mg by mouth daily.    [provider]  atorvastatin (LIPITOR) 10 MG tablet Take 10 mg by mouth daily. 08/05/20   [provider]  Cholecalciferol (VITAMIN D3) 1000 units CAPS Take 1 capsule by mouth daily.    [provider]  dapagliflozin propanediol (FARXIGA) 5 MG TABS tablet Take 5 mg by mouth daily.    [provider]  fluticasone (FLONASE) 50 MCG/ACT nasal spray Place 2 sprays into both nostrils as needed for allergies. 04/09/22   [provider]  glimepiride (AMARYL) 2 MG tablet Take 2 mg by mouth daily. 03/17/22   [provider]  isosorbide mononitrate (IMDUR) 30 MG  24 hr tablet Take 0.5 tablets (15 mg total) by mouth daily. 05/10/23   Sharlene Dory, PA-C  linagliptin (TRADJENTA) 5 MG TABS tablet Take 5 mg by mouth daily.    [provider]  lisinopril (ZESTRIL) 40 MG tablet Take 1 tablet (40 mg total) by mouth daily. 05/10/23   Sharlene Dory, PA-C  metoprolol succinate (TOPROL-XL) 50 MG 24 hr tablet TAKE 1 TABLET BY MOUTH DAILY WITH OR IMMEDIATELY FOLLOWING A MEAL Patient taking differently: Take 50 mg by mouth daily. 04/13/22   Swinyer, Zachary George, NP  metoprolol tartrate (LOPRESSOR) 25 MG tablet Take 1 tablet (25 mg total) by mouth once for 1 dose. 2 hours prior to CT 05/10/23 05/10/23  Sharlene Dory, PA-C  Multiple Vitamins-Minerals (PRESERVISION AREDS 2) CAPS Take 1 tablet by mouth 2 (two) times daily.    [provider]  omeprazole (PRILOSEC) 40 MG capsule Take 1 capsule by mouth daily. 08/09/22   [provider]  triamcinolone ointment (KENALOG) 0.5 % Apply 1 Application topically daily as needed (for rash). 06/21/21   [provider]      Allergies    Adhesive [tape], Codeine, and Empagliflozin    Review of Systems   Review of Systems  Eyes:  Positive for photophobia, pain and visual disturbance.  Cardiovascular:  Negative for chest pain.  Gastrointestinal:  Negative for vomiting.  Musculoskeletal:  Negative for neck pain and neck stiffness.  Neurological:  Positive for weakness and headaches.    Physical Exam Updated Vital Signs BP (!) 202/91 (BP Location: Right Arm)   Pulse (!) 53   Temp 98.1 F (36.7 C) (Oral)   Resp 19   Ht 5\' 3"  (1.6 m)   Wt 84.2 kg   SpO2 99%   BMI 32.88 kg/m  Physical Exam Vitals and nursing note reviewed.  Constitutional:      Appearance: She is well-developed. She is not diaphoretic.  HENT:     Head: Normocephalic and atraumatic.     Comments: No temporal artery tenderness Eyes:     Extraocular Movements: Extraocular movements intact.     Pupils: Pupils are equal, round,  and reactive to light.     Comments: Right eye photophobia. Mild right eye injection. IOP 13-14 on multiple checks.  Cardiovascular:     Rate and Rhythm: Normal rate and regular rhythm.     Heart sounds: Normal heart sounds.  Pulmonary:     Effort: Pulmonary effort is normal.     Breath sounds: Normal breath sounds.  Abdominal:     Palpations: Abdomen is soft.     Tenderness: There is no abdominal tenderness.  Musculoskeletal:     Cervical back: Normal range of motion and neck supple.  Skin:    General: Skin is warm and dry.  Neurological:     Mental Status: She is alert.     Comments: No facial droop or slurred speech. 5/5 strength in RUE. 4/5 in LUE. Both RLE and LLE seem to have 4/5 strength, but left is weaker than right.      ED Results / Procedures / Treatments   Labs (all labs ordered are listed, but only abnormal results are displayed) Labs Reviewed  COMPREHENSIVE METABOLIC PANEL - Abnormal; Notable for the following components:      Result Value   CO2 21 (*)    Glucose, Bld 108 (*)    Creatinine, Ser 1.23 (*)    AST 11 (*)    GFR, Estimated 45 (*)    All other components within normal limits  CBC WITH DIFFERENTIAL/PLATELET  ETHANOL  SEDIMENTATION RATE  C-REACTIVE PROTEIN  CBG MONITORING, ED  TROPONIN I (HIGH SENSITIVITY)    EKG None  Radiology CT Head Wo Contrast  Result Date: 05/14/2023 CLINICAL DATA:  Headache, new onset (Age >= 51y) EXAM: CT HEAD WITHOUT CONTRAST TECHNIQUE: Contiguous axial images were obtained from the base of the skull through the vertex without intravenous contrast. RADIATION DOSE REDUCTION: This exam was performed according to the departmental dose-optimization program which includes automated exposure control, adjustment of the mA and/or kV according to patient size and/or use of iterative reconstruction technique. COMPARISON:  MRI head 12/15/2018 FINDINGS: Brain: No evidence of large-territorial acute infarction. No parenchymal  hemorrhage. No mass lesion. No extra-axial collection. No mass effect or midline shift. No hydrocephalus. Basilar cisterns are patent. Vascular: No hyperdense vessel. Skull: No acute fracture or focal lesion. Sinuses/Orbits: Almost complete opacification of the right maxillary sinus. Otherwise paranasal sinuses and mastoid air cells are clear. The orbits are unremarkable. Other: None. IMPRESSION: 1. No acute intracranial abnormality. 2. Right maxillary sinus disease. Electronically Signed   By: Tish Frederickson M.D.   On: 05/14/2023 21:06    Procedures Procedures    Medications Ordered in ED Medications  fluorescein ophthalmic strip 1 strip (has no administration in time range)  tetracaine (PONTOCAINE) 0.5 % ophthalmic solution 2  drop (has no administration in time range)  sodium chloride 0.9 % bolus 500 mL (500 mLs Intravenous New Bag/Given 05/14/23 2057)  metoCLOPramide (REGLAN) injection 5 mg (5 mg Intravenous Given 05/14/23 2042)  diphenhydrAMINE (BENADRYL) injection 12.5 mg (12.5 mg Intravenous Given 05/14/23 2044)    ED Course/ Medical Decision Making/ A&P                             Medical Decision Making Amount and/or Complexity of Data Reviewed External Data Reviewed: notes.    Details: Chart review shows multiple previous presentations, around 6-10 years ago, which had headaches and left-sided weakness.  Neurology seems to think these were complicated migraine versus functional. Labs: ordered.    Details: Normal WBC, normal sed rate.  Stable creatinine. Radiology: ordered and independent interpretation performed.    Details: No head bleed. ECG/medicine tests: ordered and independent interpretation performed.    Details: Chronic changes, but no ischemia.  Risk Prescription drug management.   Patient with acute, progressive headache for about 24 hours. Acutely worse today. Given this presentation, SAH seems unlikely.  Seems to be right-sided and she has had migraines in the  past though not in a while.  However like a possible migraine given the unilateral symptoms and eye symptoms.  Ocular exam does not show any obvious findings and her IOP is normal.  Sed rate is normal so I think temporal arteritis is unlikely.  Chart review as above shows that she previously has had multiple visits for left-sided weakness with unremarkable MRIs, thought to be complicated migraine versus functional.  Will get MRI to ensure no stroke this time.  Headache has improved and blood pressure is improving with headache treatment with Reglan, Benadryl, fluids, Toradol.   Care to Dr. Eudelia Bunch.         Final Clinical Impression(s) / ED Diagnoses Final diagnoses:  None    Rx / DC Orders ED Discharge Orders     None         Pricilla Loveless, MD 05/14/23 2329

## 2023-05-14 NOTE — ED Triage Notes (Signed)
Chief Complaint  Patient presents with   Headache   Pt presents to ED 27 via EMS from Urgent Care with above complaint.  Pt endorses headache that started around 1700 yesterday.  Pain got worse today and headache did not improve.  Pain mainly in right temporal area.  Pt endorses some dizziness and blurry vision in right eye.  History of stroke with some residual left-sided weakness.  No sensory deficits or facial asymmetry.

## 2023-05-14 NOTE — ED Notes (Signed)
Patient is being discharged from the Urgent Care and sent to the Emergency Department via EMS . Per Christoper Fabian, NP, patient is in need of higher level of care due to need for CT. Patient is aware and verbalizes understanding of plan of care.  Vitals:   05/14/23 1857  BP: (!) 219/98  Pulse: (!) 56  Resp: 18  Temp: 98.4 F (36.9 C)  SpO2: 96%

## 2023-05-14 NOTE — ED Triage Notes (Signed)
Pt c/o HA at R temple that began today around noon. Later noted swelling to R eye. Endorses nausea and feeling "wobbly."

## 2023-05-14 NOTE — ED Notes (Signed)
Pt being taken to MRI at this time

## 2023-05-15 ENCOUNTER — Telehealth: Payer: Self-pay | Admitting: Cardiology

## 2023-05-15 NOTE — Telephone Encounter (Signed)
Patient head is hurting the right side of her head. Please advise

## 2023-05-15 NOTE — Telephone Encounter (Signed)
Spoke to the pt , yesterday pt was seen in at [facility] Urgent care c/o  of pain on the right side head and right eye was swollen:  Given her acute headache that is a 10 out of 10, accelerated blood pressure, and symptoms with her medical history advised EMS transfer to the ER for further evaluation and workup. Patient is in agreement with plan and was taken via EMS to the emergency room.   Pt was evaluated  at [facility] ED:  Patient with acute, progressive headache for about 24 hours. Acutely worse today. Given this presentation, SAH seems unlikely.  Seems to be right-sided and she has had migraines in the past though not in a while.  However like a possible migraine given the unilateral symptoms and eye symptoms.  Ocular exam does not show any obvious findings and her IOP is normal.  Sed rate is normal so I think temporal arteritis is unlikely.  Chart review as above shows that she previously has had multiple visits for left-sided weakness with unremarkable MRIs, thought to be complicated migraine versus functional.  Will get MRI to ensure no stroke this time.  Headache has improved and blood pressure is improving with headache treatment with Reglan, Benadryl, fluids, Toradol.   Pt stated she still has headache on the right side of her head, denies any other symptoms. She did contact her PCP and currently waiting for advise. Pt does monitor bp at home. Explained ED precautions, pt voiced understanding. Will forward to MD and nurse for advise.    Blood pressure  6/25  152/88  AM  6/24 140/77   AM          150/88 1400

## 2023-05-15 NOTE — ED Notes (Signed)
Pt provided with AVS.  Education complete; all questions answered.  Pt leaving ED in stable condition at this time via wheelchair with all belongings. 

## 2023-05-15 NOTE — ED Provider Notes (Signed)
I assumed care of this patient from previous provider.  Please see their note for further details of history, exam, and MDM.   Briefly patient is a 77 y.o. female who presented headache.  Patient also had blurry vision.  Exam was reportedly not concerning for temporal arteritis.  Most concerning for complex migraine headache but wanted to rule out stroke with MRI which is currently pending.  Patient was treated with migraine cocktail.  Plan to follow-up MRI and reassess patient.  MRI negative for any acute strokes. On reassessment, patient reports that her headache and her symptoms are better.  The patient appears reasonably screened and/or stabilized for discharge and I doubt any other medical condition or other [facility] requiring further screening, evaluation, or treatment in the ED at this time. I have discussed the findings, Dx and Tx plan with the patient/family who expressed understanding and agree(s) with the plan. Discharge instructions discussed at length. The patient/family was given strict return precautions who verbalized understanding of the instructions. No further questions at time of discharge.  Disposition: Discharge  Condition: Good  ED Discharge Orders     None        Follow Up: Corliss Blacker, MD [address] [place] [address] [phone]  Call  to schedule an appointment for close follow up  [facility] [address]     [address] [place] [address] [phone] Call  as needed         Nira Conn, MD 05/15/23 [phone]

## 2023-05-16 NOTE — Telephone Encounter (Signed)
Jake Bathe, MD  You17 hours ago (3:23 PM)    Thank you for the update.    No new orders given at this time.  Per Dr Anne Fu, pt should follow up with PCP.

## 2023-05-23 ENCOUNTER — Telehealth: Payer: Self-pay | Admitting: Physician Assistant

## 2023-05-23 NOTE — Telephone Encounter (Signed)
Pt states that her PCP would like to prescribe either Hydrochlorothiazide 12.5 MG or Amlodipine 5 MG. She would ike a callback regarding whether either of these medications are okay for her to take. Please advise

## 2023-05-23 NOTE — Telephone Encounter (Signed)
Spoke with patient and shared response from Jari Favre, PA-C:  I'm okay with adding back Norvasc  Sharlene Dory, PA-C    Advised patient to notify PCP office to make them aware she is taking both amlodipine 5mg  and HCT 12.5mg  daily.  Medication list updated.  Patient verbalized understanding and expressed appreciation for call.

## 2023-05-23 NOTE — Telephone Encounter (Signed)
Spoke with patient. She states her PCP had started her on amlodipine 5mg  in April this year, but she has stopped taking it and was started on hydrochlorothiazide 12.5mg  QAM on June 26th.  She states she checks her BP twice daily, morning BP taken 1 hour after taking medications:  7/3---153/80 7/2---142/80 (PM) 7/2---135/70, HR 70 (AM) 7/1---143/80 (PM) 7/1---138/89, HR 61  Patient states her PCP did not want her to restart amlodipine until checking in with cardiologist.  Will forward to Jari Favre, PA-C to review and advise.

## 2023-05-31 ENCOUNTER — Encounter (HOSPITAL_COMMUNITY): Payer: Self-pay

## 2023-05-31 ENCOUNTER — Telehealth (HOSPITAL_COMMUNITY): Payer: Self-pay | Admitting: *Deleted

## 2023-05-31 ENCOUNTER — Ambulatory Visit (HOSPITAL_COMMUNITY): Payer: Medicare Other

## 2023-05-31 NOTE — Telephone Encounter (Signed)
Reaching out to patient to offer assistance regarding upcoming cardiac imaging study; pt verbalizes understanding of appt date/time, parking situation and where to check in, pre-test NPO status and verified current allergies; name and call back number provided for further questions should they arise  Clelia Trabucco RN Navigator Cardiac Imaging [facility] [phone] office [phone] cell  Patient aware to arrive at 8:30am. 

## 2023-05-31 NOTE — Telephone Encounter (Signed)
Attempted to call patient regarding upcoming cardiac CT appointment. °Left message on voicemail with name and callback number ° °Zabian Swayne RN Navigator Cardiac Imaging °[facility] °[phone] Office °[phone] Cell ° °

## 2023-06-01 ENCOUNTER — Ambulatory Visit (HOSPITAL_COMMUNITY)
Admission: RE | Admit: 2023-06-01 | Discharge: 2023-06-01 | Disposition: A | Discharge status: Home / Self Care | Payer: Medicare Other | Source: Ambulatory Visit | Attending: Physician Assistant | Admitting: Physician Assistant

## 2023-06-01 DIAGNOSIS — R072 Precordial pain: Secondary | ICD-10-CM

## 2023-06-01 DIAGNOSIS — I700 Atherosclerosis of aorta: Secondary | ICD-10-CM | POA: Diagnosis not present

## 2023-06-01 MED ORDER — IOHEXOL 350 MG/ML SOLN
95.0000 mL | Freq: Once | INTRAVENOUS | Status: AC | PRN
  Administered 2023-06-01: 95 mL via INTRAVENOUS

## 2023-06-01 MED ORDER — NITROGLYCERIN 0.4 MG SL SUBL
SUBLINGUAL_TABLET | SUBLINGUAL | Status: AC
  Filled 2023-06-01: qty 2

## 2023-06-01 MED ORDER — NITROGLYCERIN 0.4 MG SL SUBL
0.8000 mg | SUBLINGUAL_TABLET | Freq: Once | SUBLINGUAL | Status: AC
  Administered 2023-06-01: 0.8 mg via SUBLINGUAL

## 2023-06-01 NOTE — Progress Notes (Signed)
   06/01/23 0921  Vitals  BP (!) 172/91  BP Location Right Arm  BP Method Automatic  Patient Position (if appropriate) Sitting  ECG Heart Rate 64  MEWS COLOR  MEWS Score Color Green  MEWS Score  MEWS Temp 0  MEWS Systolic 0  MEWS Pulse 0  MEWS RR 0  MEWS LOC 0  MEWS Score 0    Vitals after CT cardiac completed.

## 2023-06-04 NOTE — Progress Notes (Unsigned)
Office Visit    Patient Name: [name] [name] Date of Encounter: 06/05/2023  PCP:  Corliss Blacker, MD   [facility]  Cardiologist:  Donato Schultz, MD  Advanced Practice Provider:  No care team member to display Electrophysiologist:  None   HPI    [name] [name] is a 77 y.o. female with a past medical history of dilated ascending aorta, prior stroke with residual left-sided weakness, diabetes, and hyperlipidemia presents today for follow-up visit.  She was admitted 05/2021 with chest pain.  Underwent left heart catheterization that showed widely patent coronary arteries with only minimal luminal irregularities in the LAD and circumflex.  Echo showed EF 55 to 60%, G1 DD, mild MR.  She was last seen 12/09/2021 by Dr. Anne Fu at which time no changes were made and 1 year follow-up was advised.  She was last seen in our office 04/13/2022 and received referral from PCP for evaluation.  The patient at the time was unsure of the exact reason but was advised to come in for sooner follow-up.  Reported more chest pain recently over approximately 3 months but upon further discussion was describing more difficulty breathing when laying on left side and palpitations that she felt most often when laying down.  Felt better when she turned on her right side and elevated her head slightly.  Occasionally felt palpitations during the day.  Some shortness of breath when working in her yard.  Occasional lightheadedness.  No syncope or presyncope.  Cardiac monitor revealed sinus rhythm 65 bpm on average, occasional short episode of atrial tachycardia, rare PAC/PVC, no atrial fibrillation or pauses.  She was seen in follow-up 07/06/2022 and at that time she reported dyspnea had improved.  She did not have any specific concerns.  Realizing that she cannot do some things that she previously could do such as moving furniture or going up and down ladders.  Enjoyed gardening.  Denied any cardiac  symptoms.  She was seen by me 01/22/2023, she states that she is under a lot of stress because her son is sick and his wife is not treating him well.  She keeps the family in the dark about a lot medical decisions.  Blood pressure is elevated today and was fine a few months ago.  She does get some fluttering in her chest when she gets really upset.  She does not sleep well at night because she constantly is worrying.  We discussed how the stress is affecting her health and her blood pressure.  I did suggest reaching out to Lasandra Beech to see if she could provide some resources to help remedy the situation.  Seen in the ED for acute mastoiditis, otitis media on the left 3/13.  She was seen by me 05/10/2023, she tells me that he is actually taking 20 mg of lisinopril.  Blood pressure still in the 180s systolic over 100 diastolic.  She is having weakness in her left arm and palpitations 78.  She states that she has been having chest pains for 2 weeks.  She thought it was indigestion but it does get worse with activity.  No lower extremity swelling.  Some shortness of breath with activity but none at rest.  We discussed that a few options for an ischemic workup.  We have titrated her blood pressure medications and educated her on how to keep track daily.  She does tell me that she has lightheaded and dizzy at times but she does have  a diagnosis of vertigo as well.  Today, she tells me she is not having any  dizziness and lightheadedness. BP was so high before but now is much better. 125/70 at home, she forgot to bring her log. No chest pain,  SOB or palpitations. Feeling good today. I reviewed her CTA with her today. No significant CAD and no aortic aneurysm. Discussed repeating a lipid panel and LFTs in a few weeks (she has been of her statin for testing).  Reports no shortness of breath nor dyspnea on exertion. Reports no chest pain, pressure, or tightness. No edema, orthopnea, PND. Reports no palpitations.      Past Medical History    Past Medical History:  Diagnosis Date   Arthritis    Bronchitis    hx   Chest pain 2009   MV with no scar or ischemia, EF 65%   Complication of anesthesia    claustrophobic !   CVA (cerebral vascular accident) (HCC) 05/05/2012   Diabetes mellitus    GERD (gastroesophageal reflux disease)    History of cardiac catheterization    a. Myoview 2/15 with anteroapical ischemia, EF 64% (intermediate risk) >> LHC - normal cos, EF 55-60%   Hypertension    Hypokalemia 06/10/2021   Neuromuscular disorder (HCC)    arotic Aneurysm   Obesity (BMI 35.0-39.9 without comorbidity)    Pneumonia    Stroke [facility])    with some resid L sided weakness   Thoracic ascending aortic aneurysm (HCC)    a. CT 1/17: ascending aorta 4.2 x 4.0 cm   Past Surgical History:  Procedure Laterality Date   ABDOMINAL HYSTERECTOMY  2006   Partial, one ovary left    CARDIAC CATHETERIZATION  2004   [place], [place] per pt.   CHOLECYSTECTOMY     EAR CYST EXCISION Left 09/20/2016   Procedure: EXCISION  LEFT EAR LESION;  Surgeon: Newman Pies, MD;  Location: [facility] OR;  Service: ENT;  Laterality: Left;   LEFT HEART CATH AND CORONARY ANGIOGRAPHY N/A 06/10/2021   Procedure: LEFT HEART CATH AND CORONARY ANGIOGRAPHY;  Surgeon: Tonny Bollman, MD;  Location: [facility] INVASIVE CV LAB;  Service: Cardiovascular;  Laterality: N/A;   LEFT HEART CATHETERIZATION WITH CORONARY ANGIOGRAM N/A 01/09/2014   Procedure: LEFT HEART CATHETERIZATION WITH CORONARY ANGIOGRAM;  Surgeon: Marykay Lex, MD;  Location: [facility] CATH LAB;  Service: Cardiovascular;  Laterality: N/A;   MENISCUS REPAIR Right    ROTATOR CUFF REPAIR Left    SKIN SPLIT GRAFT Left 09/20/2016   Procedure: SKIN GRAFT SPLIT THICKNESS;  Surgeon: Newman Pies, MD;  Location: [facility] OR;  Service: ENT;  Laterality: Left;   TEMPOROMANDIBULAR JOINT SURGERY     TONSILLECTOMY     TYMPANOSTOMY TUBE PLACEMENT Left 06/03/2022   WISDOM TOOTH EXTRACTION       Allergies  Allergies  Allergen Reactions   Adhesive [Tape] Rash    pls use elastic wraps instead of adhesive tape   Codeine Nausea And Vomiting   Empagliflozin Other (See Comments)    Headaches & lower back pain    EKGs/Labs/Other Studies Reviewed:   The following studies were reviewed today:   Cardiac monitor 6/27   Sinus rhythm 65 bpm on average Occasional short episodes of atrial tachycardia-benign. Rare PACs, rare PVCs No atrial fibrillation no pauses no adverse arrhythmias.   LHC 06/10/21   Widely patent coronary arteries with minimal irregularities in the LAD and LCx, angiographically normal left main and RCA. Normal LVEDP Tortuous right subclavian artery  Suspect non-cardiac chest pain     Echo 06/10/21    1. Compared to 02/09/20, findings are similar.   2. Left ventricular ejection fraction, by estimation, is 55 to 60%. The  left ventricle has normal function. The left ventricle has no regional  wall motion abnormalities. Left ventricular diastolic parameters are  consistent with Grade I diastolic  dysfunction (impaired relaxation).   3. Right ventricular systolic function is normal. The right ventricular  size is normal. There is mildly elevated pulmonary artery systolic  pressure.   4. The mitral valve is normal in structure. Mild mitral valve  regurgitation. No evidence of mitral stenosis.   5. The aortic valve is tricuspid. Aortic valve regurgitation is trivial.  No aortic stenosis is present.   6. Aortic dilatation noted. There is mild dilatation of the ascending  aorta and of the aortic root, measuring 38 mm.   7. The inferior vena cava is normal in size with greater than 50%  respiratory variability, suggesting right atrial pressure of 3 mmHg.    Cardiac monitor 10/11/16   No atrial fibrillation No pauses Average heart rate 62 bpm   Reassuring monitor  EKG:  EKG is not ordered today.    Recent Labs: 01/31/2023: Magnesium 1.9 05/14/2023:  ALT 10; BUN 16; Creatinine, Ser 1.23; Hemoglobin 13.4; Platelets 248; Potassium 3.5; Sodium 137  Recent Lipid Panel    Component Value Date/Time   CHOL 113 06/10/2021 0422   TRIG 78 06/10/2021 0422   HDL 34 (L) 06/10/2021 0422   CHOLHDL 3.3 06/10/2021 0422   VLDL 16 06/10/2021 0422   LDLCALC 63 06/10/2021 0422    Home Medications   Current Meds  Medication Sig   albuterol (PROVENTIL HFA;VENTOLIN HFA) 108 (90 BASE) MCG/ACT inhaler Inhale 1-2 puffs into the lungs every 6 (six) hours as needed for wheezing.   aspirin EC 81 MG tablet Take 81 mg by mouth daily.   atorvastatin (LIPITOR) 10 MG tablet Take 10 mg by mouth daily.   Cholecalciferol (VITAMIN D3) 1000 units CAPS Take 1 capsule by mouth daily.   fluticasone (FLONASE) 50 MCG/ACT nasal spray Place 2 sprays into both nostrils as needed for allergies.   glimepiride (AMARYL) 2 MG tablet Take 2 mg by mouth daily.   hydrochlorothiazide (MICROZIDE) 12.5 MG capsule Take 12.5 mg by mouth daily.   linagliptin (TRADJENTA) 5 MG TABS tablet Take 5 mg by mouth daily.   lisinopril (ZESTRIL) 40 MG tablet Take 1 tablet (40 mg total) by mouth daily.   metoprolol succinate (TOPROL-XL) 50 MG 24 hr tablet TAKE 1 TABLET BY MOUTH DAILY WITH OR IMMEDIATELY FOLLOWING A MEAL (Patient taking differently: Take 50 mg by mouth daily.)   omeprazole (PRILOSEC) 40 MG capsule Take 1 capsule by mouth daily.   triamcinolone ointment (KENALOG) 0.5 % Apply 1 Application topically daily as needed (for rash).   [DISCONTINUED] amLODipine (NORVASC) 5 MG tablet Take 5 mg by mouth daily.   [DISCONTINUED] dapagliflozin propanediol (FARXIGA) 5 MG TABS tablet Take 5 mg by mouth daily.   [DISCONTINUED] isosorbide mononitrate (IMDUR) 30 MG 24 hr tablet Take 0.5 tablets (15 mg total) by mouth daily.   [DISCONTINUED] Multiple Vitamins-Minerals (PRESERVISION AREDS 2) CAPS Take 1 tablet by mouth 2 (two) times daily.     Review of Systems      All other systems reviewed and are  otherwise negative except as noted above.  Physical Exam    VS:  BP 124/68   Pulse 64   Ht  5\' 3"  (1.6 m)   Wt 181 lb (82.1 kg)   SpO2 98%   BMI 32.06 kg/m  , BMI Body mass index is 32.06 kg/m.  Wt Readings from Last 3 Encounters:  06/05/23 181 lb (82.1 kg)  05/14/23 185 lb 10 oz (84.2 kg)  05/10/23 185 lb 9.6 oz (84.2 kg)     GEN: Well nourished, well developed, in no acute distress. HEENT: normal. Neck: Supple, no JVD, carotid bruits, or masses. Cardiac: RRR, no murmurs, rubs, or gallops. No clubbing, cyanosis, edema.  Radials/PT 2+ and equal bilaterally.  Respiratory:  Respirations regular and unlabored, clear to auscultation bilaterally. GI: Soft, nontender, nondistended. MS: No deformity or atrophy. Skin: Warm and dry, no rash. Neuro:  Strength and sensation are intact. Psych: Normal affect.  Assessment & Plan    Chest pain/SOB -no chest pain or SOB -no Imdur -CTA with aorta normal caliber. LCX with 25% stenosis other arteries clean. -continue current medications  Essential hypertension -much better controlled today -continue lisinopril to 40 mg daily and continue metoprolol succinate 50 mg daily -We discussed her checking her blood pressure at home daily an hour after morning medications and recording.  Please bring these values with you to your next appointment.   Hyperlipidemia, goal less than 70 -Continue Lipitor 10 mg daily -lipid panel and LFTs ordered today  -Last LDL 63 -Will need updated labs from PCP  Aortic  root dilation  -Mild, 38 mm -Extremely important to monitor blood pressure closely  Heart palpitations  -Has not happened,no further workup at this time        Disposition: Follow up 6 months with Donato Schultz, MD or APP.  Signed, Sharlene Dory, PA-C 06/05/2023, 8:57 AM [facility]

## 2023-06-05 ENCOUNTER — Ambulatory Visit: Payer: Medicare Other | Attending: Physician Assistant | Admitting: Physician Assistant

## 2023-06-05 ENCOUNTER — Encounter: Payer: Self-pay | Admitting: Physician Assistant

## 2023-06-05 VITALS — BP 124/68 | HR 64 | Ht 63.0 in | Wt 181.0 lb

## 2023-06-05 DIAGNOSIS — R0609 Other forms of dyspnea: Secondary | ICD-10-CM

## 2023-06-05 DIAGNOSIS — R0789 Other chest pain: Secondary | ICD-10-CM | POA: Diagnosis not present

## 2023-06-05 DIAGNOSIS — I77810 Thoracic aortic ectasia: Secondary | ICD-10-CM | POA: Diagnosis not present

## 2023-06-05 DIAGNOSIS — R002 Palpitations: Secondary | ICD-10-CM

## 2023-06-05 DIAGNOSIS — I7121 Aneurysm of the ascending aorta, without rupture: Secondary | ICD-10-CM

## 2023-06-05 MED ORDER — HYDROCHLOROTHIAZIDE 12.5 MG PO CAPS: 12.5000 mg | ORAL_CAPSULE | Freq: Every day | ORAL | 3 refills | Status: DC

## 2023-06-05 NOTE — Addendum Note (Signed)
Addended by: Oleta Mouse on: 06/05/2023 09:05 AM   Modules accepted: Orders

## 2023-06-05 NOTE — Patient Instructions (Addendum)
Medication Instructions:    Your physician recommends that you continue on your current medications as directed. Please refer to the Current Medication list given to you today.  *If you need a refill on your cardiac medications before your next appointment, please call your pharmacy*   Lab Work:  RETURN FASTING  IN 4 WEEKS LIVER AND LIPID     If you have labs (blood work) drawn today and your tests are completely normal, you will receive your results only by: MyChart Message (if you have MyChart) OR A paper copy in the mail If you have any lab test that is abnormal or we need to change your treatment, we will call you to review the results.   Testing/Procedures:  NONE ORDERED  TODAY     Follow-Up: At [facility], you and your health needs are our priority.  As part of our continuing mission to provide you with exceptional heart care, we have created designated Provider Care Teams.  These Care Teams include your primary Cardiologist (physician) and Advanced Practice Providers (APPs -  Physician Assistants and Nurse Practitioners) who all work together to provide you with the care you need, when you need it.  We recommend signing up for the patient portal called "MyChart".  Sign up information is provided on this After Visit Summary.  MyChart is used to connect with patients for Virtual Visits (Telemedicine).  Patients are able to view lab/test results, encounter notes, upcoming appointments, etc.  Non-urgent messages can be sent to your provider as well.   To learn more about what you can do with MyChart, go to [email or web address].    Your next appointment:   6 month(s)  Provider:   Donato Schultz, MD  or Jari Favre, PA-C        Other Instructions   Low-Sodium Eating Plan Salt (sodium) helps you keep a healthy balance of fluids in your body. Too much sodium can raise your blood pressure. It can also cause fluid and waste to be held in your body. Your health care  provider or dietitian may recommend a low-sodium eating plan if you have high blood pressure (hypertension), kidney disease, liver disease, or heart failure. Eating less sodium can help lower your blood pressure and reduce swelling. It can also protect your heart, liver, and kidneys. What are tips for following this plan? Reading food labels  Check food labels for the amount of sodium per serving. If you eat more than one serving, you must multiply the listed amount by the number of servings. Choose foods with less than 140 milligrams (mg) of sodium per serving. Avoid foods with 300 mg of sodium or more per serving. Always check how much sodium is in a product, even if the label says "unsalted" or "no salt added." Shopping  Buy products labeled as "low-sodium" or "no salt added." Buy fresh foods. Avoid canned foods and pre-made or frozen meals. Avoid canned, cured, or processed meats. Buy breads that have less than 80 mg of sodium per slice. Cooking  Eat more home-cooked food. Try to eat less restaurant, buffet, and fast food. Try not to add salt when you cook. Use salt-free seasonings or herbs instead of table salt or sea salt. Check with your provider or pharmacist before using salt substitutes. Cook with plant-based oils, such as canola, sunflower, or olive oil. Meal planning When eating at a restaurant, ask if your food can be made with less salt or no salt. Avoid dishes labeled as brined, pickled, cured,  or smoked. Avoid dishes made with soy sauce, miso, or teriyaki sauce. Avoid foods that have monosodium glutamate (MSG) in them. MSG may be added to some restaurant food, sauces, soups, bouillon, and canned foods. Make meals that can be grilled, baked, poached, roasted, or steamed. These are often made with less sodium. General information Try to limit your sodium intake to 1,500-2,300 mg each day, or the amount told by your provider. What foods should I eat? Fruits Fresh, frozen, or  canned fruit. Fruit juice. Vegetables Fresh or frozen vegetables. "No salt added" canned vegetables. "No salt added" tomato sauce and paste. Low-sodium or reduced-sodium tomato and vegetable juice. Grains Low-sodium cereals, such as oats, puffed wheat and rice, and shredded wheat. Low-sodium crackers. Unsalted rice. Unsalted pasta. Low-sodium bread. Whole grain breads and whole grain pasta. Meats and other proteins Fresh or frozen meat, poultry, seafood, and fish. These should have no added salt. Low-sodium canned tuna and salmon. Unsalted nuts. Dried peas, beans, and lentils without added salt. Unsalted canned beans. Eggs. Unsalted nut butters. Dairy Milk. Soy milk. Cheese that is naturally low in sodium, such as ricotta cheese, fresh mozzarella, or Swiss cheese. Low-sodium or reduced-sodium cheese. Cream cheese. Yogurt. Seasonings and condiments Fresh and dried herbs and spices. Salt-free seasonings. Low-sodium mustard and ketchup. Sodium-free salad dressing. Sodium-free light mayonnaise. Fresh or refrigerated horseradish. Lemon juice. Vinegar. Other foods Homemade, reduced-sodium, or low-sodium soups. Unsalted popcorn and pretzels. Low-salt or salt-free chips. The items listed above may not be all the foods and drinks you can have. Talk to a dietitian to learn more. What foods should I avoid? Vegetables Sauerkraut, pickled vegetables, and relishes. Olives. [place] fries. Onion rings. Regular canned vegetables, except low-sodium or reduced-sodium items. Regular canned tomato sauce and paste. Regular tomato and vegetable juice. Frozen vegetables in sauces. Grains Instant hot cereals. Bread stuffing, pancake, and biscuit mixes. Croutons. Seasoned rice or pasta mixes. Noodle soup cups. Boxed or frozen macaroni and cheese. Regular salted crackers. Self-rising flour. Meats and other proteins Meat or fish that is salted, canned, smoked, spiced, or pickled. Precooked or cured meat, such as sausages or  meat loaves. Tomasa Blase. Ham. Pepperoni. Hot dogs. Corned beef. Chipped beef. Salt pork. Jerky. Pickled herring, anchovies, and sardines. Regular canned tuna. Salted nuts. Dairy Processed cheese and cheese spreads. Hard cheeses. Cheese curds. Blue cheese. Feta cheese. String cheese. Regular cottage cheese. Buttermilk. Canned milk. Fats and oils Salted butter. Regular margarine. Ghee. Bacon fat. Seasonings and condiments Onion salt, garlic salt, seasoned salt, table salt, and sea salt. Canned and packaged gravies. Worcestershire sauce. Tartar sauce. Barbecue sauce. Teriyaki sauce. Soy sauce, including reduced-sodium soy sauce. Steak sauce. Fish sauce. Oyster sauce. Cocktail sauce. Horseradish that you find on the shelf. Regular ketchup and mustard. Meat flavorings and tenderizers. Bouillon cubes. Hot sauce. Pre-made or packaged marinades. Pre-made or packaged taco seasonings. Relishes. Regular salad dressings. Salsa. Other foods Salted popcorn and pretzels. Corn chips and puffs. Potato and tortilla chips. Canned or dried soups. Pizza. Frozen entrees and pot pies. The items listed above may not be all the foods and drinks you should avoid. Talk to a dietitian to learn more. This information is not intended to replace advice given to you by your health care provider. Make sure you discuss any questions you have with your health care provider. Document Revised: 11/23/2022 Document Reviewed: 11/23/2022 Elsevier Patient Education  2024 [employer]   Heart-Healthy Eating Plan Eating a healthy diet is important for the health of your heart. A heart-healthy  eating plan includes: Eating less unhealthy fats. Eating more healthy fats. Eating less salt in your food. Salt is also called sodium. Making other changes in your diet. Talk with your doctor or a diet specialist (dietitian) to create an eating plan that is right for you. What is my plan? Your doctor may recommend an eating plan that includes: Total  fat: ______% or less of total calories a day. Saturated fat: ______% or less of total calories a day. Cholesterol: less than _________mg a day. Sodium: less than _________mg a day. What are tips for following this plan? Cooking Avoid frying your food. Try to bake, boil, grill, or broil it instead. You can also reduce fat by: Removing the skin from poultry. Removing all visible fats from meats. Steaming vegetables in water or broth. Meal planning  At meals, divide your plate into four equal parts: Fill one-half of your plate with vegetables and green salads. Fill one-fourth of your plate with whole grains. Fill one-fourth of your plate with lean protein foods. Eat 2-4 cups of vegetables per day. One cup of vegetables is: 1 cup (91 g) broccoli or cauliflower florets. 2 medium carrots. 1 large bell pepper. 1 large sweet potato. 1 large tomato. 1 medium white potato. 2 cups (150 g) raw leafy greens. Eat 1-2 cups of fruit per day. One cup of fruit is: 1 small apple 1 large banana 1 cup (237 g) mixed fruit, 1 large orange,  cup (82 g) dried fruit, 1 cup (240 mL) 100% fruit juice. Eat more foods that have soluble fiber. These are apples, broccoli, carrots, beans, peas, and barley. Try to get 20-30 g of fiber per day. Eat 4-5 servings of nuts, legumes, and seeds per week: 1 serving of dried beans or legumes equals  cup (90 g) cooked. 1 serving of nuts is  oz (12 almonds, 24 pistachios, or 7 walnut halves). 1 serving of seeds equals  oz (8 g). General information Eat more home-cooked food. Eat less restaurant, buffet, and fast food. Limit or avoid alcohol. Limit foods that are high in starch and sugar. Avoid fried foods. Lose weight if you are overweight. Keep track of how much salt (sodium) you eat. This is important if you have high blood pressure. Ask your doctor to tell you more about this. Try to add vegetarian meals each week. Fats Choose healthy fats. These include  olive oil and canola oil, flaxseeds, walnuts, almonds, and seeds. Eat more omega-3 fats. These include salmon, mackerel, sardines, tuna, flaxseed oil, and ground flaxseeds. Try to eat fish at least 2 times each week. Check food labels. Avoid foods with trans fats or high amounts of saturated fat. Limit saturated fats. These are often found in animal products, such as meats, butter, and cream. These are also found in plant foods, such as palm oil, palm kernel oil, and coconut oil. Avoid foods with partially hydrogenated oils in them. These have trans fats. Examples are stick margarine, some tub margarines, cookies, crackers, and other baked goods. What foods should I eat? Fruits All fresh, canned (in natural juice), or frozen fruits. Vegetables Fresh or frozen vegetables (raw, steamed, roasted, or grilled). Green salads. Grains Most grains. Choose whole wheat and whole grains most of the time. Rice and pasta, including brown rice and pastas made with whole wheat. Meats and other proteins Lean, well-trimmed beef, veal, pork, and lamb. Chicken and [place] without skin. All fish and shellfish. Wild duck, rabbit, pheasant, and venison. Egg whites or low-cholesterol egg substitutes.  Dried beans, peas, lentils, and tofu. Seeds and most nuts. Dairy Low-fat or nonfat cheeses, including ricotta and mozzarella. Skim or 1% milk that is liquid, powdered, or evaporated. Buttermilk that is made with low-fat milk. Nonfat or low-fat yogurt. Fats and oils Non-hydrogenated (trans-free) margarines. Vegetable oils, including soybean, sesame, sunflower, olive, peanut, safflower, corn, canola, and cottonseed. Salad dressings or mayonnaise made with a vegetable oil. Beverages Mineral water. Coffee and tea. Diet carbonated beverages. Sweets and desserts Sherbet, gelatin, and fruit ice. Small amounts of dark chocolate. Limit all sweets and desserts. Seasonings and condiments All seasonings and condiments. The items  listed above may not be a complete list of foods and drinks you can eat. Contact a dietitian for more options. What foods should I avoid? Fruits Canned fruit in heavy syrup. Fruit in cream or butter sauce. Fried fruit. Limit coconut. Vegetables Vegetables cooked in cheese, cream, or butter sauce. Fried vegetables. Grains Breads that are made with saturated or trans fats, oils, or whole milk. Croissants. Sweet rolls. Donuts. High-fat crackers, such as cheese crackers. Meats and other proteins Fatty meats, such as hot dogs, ribs, sausage, bacon, rib-eye roast or steak. High-fat deli meats, such as salami and bologna. Caviar. Domestic duck and goose. Organ meats, such as liver. Dairy Cream, sour cream, cream cheese, and creamed cottage cheese. Whole-milk cheeses. Whole or 2% milk that is liquid, evaporated, or condensed. Whole buttermilk. Cream sauce or high-fat cheese sauce. Yogurt that is made from whole milk. Fats and oils Meat fat, or shortening. Cocoa butter, hydrogenated oils, palm oil, coconut oil, palm kernel oil. Solid fats and shortenings, including bacon fat, salt pork, lard, and butter. Nondairy cream substitutes. Salad dressings with cheese or sour cream. Beverages Regular sodas and juice drinks with added sugar. Sweets and desserts Frosting. Pudding. Cookies. Cakes. Pies. Milk chocolate or white chocolate. Buttered syrups. Full-fat ice cream or ice cream drinks. The items listed above may not be a complete list of foods and drinks to avoid. Contact a dietitian for more information. Summary Heart-healthy meal planning includes eating less unhealthy fats, eating more healthy fats, and making other changes in your diet. Eat a balanced diet. This includes fruits and vegetables, low-fat or nonfat dairy, lean protein, nuts and legumes, whole grains, and heart-healthy oils and fats. This information is not intended to replace advice given to you by your health care provider. Make sure you  discuss any questions you have with your health care provider. Document Revised: 12/12/2021 Document Reviewed: 12/12/2021 Elsevier Patient Education  2024 [employer].

## 2023-06-06 ENCOUNTER — Ambulatory Visit: Payer: Medicare Other | Admitting: Podiatry

## 2023-06-11 ENCOUNTER — Ambulatory Visit: Admission: RE | Admit: 2023-06-11 | Payer: Medicare Other | Source: Ambulatory Visit

## 2023-06-11 DIAGNOSIS — Z13820 Encounter for screening for osteoporosis: Secondary | ICD-10-CM

## 2023-07-03 ENCOUNTER — Ambulatory Visit: Payer: Medicare Other | Attending: Physician Assistant

## 2023-09-15 ENCOUNTER — Other Ambulatory Visit: Payer: Self-pay

## 2023-09-15 ENCOUNTER — Ambulatory Visit
Admission: EM | Admit: 2023-09-15 | Discharge: 2023-09-15 | Disposition: A | Discharge status: Home / Self Care | Payer: Medicare Other

## 2023-09-15 DIAGNOSIS — E119 Type 2 diabetes mellitus without complications: Secondary | ICD-10-CM

## 2023-09-15 DIAGNOSIS — L309 Dermatitis, unspecified: Secondary | ICD-10-CM

## 2023-09-15 DIAGNOSIS — I10 Essential (primary) hypertension: Secondary | ICD-10-CM

## 2023-09-15 DIAGNOSIS — L03113 Cellulitis of right upper limb: Secondary | ICD-10-CM | POA: Diagnosis not present

## 2023-09-15 MED ORDER — PREDNISONE 20 MG PO TABS: 20.0000 mg | ORAL_TABLET | Freq: Every day | ORAL | 0 refills | Status: AC

## 2023-09-15 MED ORDER — DOXYCYCLINE HYCLATE 100 MG PO CAPS: 100.0000 mg | ORAL_CAPSULE | Freq: Two times a day (BID) | ORAL | 0 refills | Status: AC

## 2023-09-15 NOTE — ED Provider Notes (Signed)
[facility]    CSN: [id number] Arrival date & time: 09/15/23  1232      History   Chief Complaint Chief Complaint  Patient presents with   Rash   Hand Problem    HPI [name] [name] is a 77 y.o. female.   Patient here today for evaluation of a rash with associated swelling to her lower right arm and wrist.  She reports that rash and redness even oriented had excellent treatment prior to this.  She denies any rash or symptoms around flu vaccine injection site.  She has not any known injury.  She denies any fever.  The history is provided by the patient.  Rash Associated symptoms: no abdominal pain, no fever, no nausea and not vomiting     Past Medical History:  Diagnosis Date   Arthritis    Bronchitis    hx   Chest pain 2009   MV with no scar or ischemia, EF 65%   Complication of anesthesia    claustrophobic !   CVA (cerebral vascular accident) (HCC) 05/05/2012   Diabetes mellitus    GERD (gastroesophageal reflux disease)    History of cardiac catheterization    a. Myoview 2/15 with anteroapical ischemia, EF 64% (intermediate risk) >> LHC - normal cos, EF 55-60%   Hypertension    Hypokalemia 06/10/2021   Neuromuscular disorder (HCC)    arotic Aneurysm   Obesity (BMI 35.0-39.9 without comorbidity)    Pneumonia    Stroke [facility])    with some resid L sided weakness   Thoracic ascending aortic aneurysm (HCC)    a. CT 1/17: ascending aorta 4.2 x 4.0 cm    Patient Active Problem List   Diagnosis Date Noted   Mastoiditis, acute, left 01/31/2023   Plantar flexed metatarsal bone of left foot 11/29/2022   History of stroke 12/09/2021   Aortic root dilation (HCC) 06/10/2021   Unstable angina (HCC) 06/09/2021   Diabetic polyneuropathy associated with type 2 diabetes mellitus (HCC) 01/11/2021   Pelvic relaxation due to cystocele, midline 07/15/2020   Encounter for fitting and adjustment of pessary 07/15/2020   Nontraumatic complete tear of left rotator  cuff 01/22/2020   Chronic sinusitis    Complicated migraine    Pure hypercholesterolemia    History of TIA (transient ischemic attack)    Chest pain    Osteopenia 08/25/2016   Chronic diastolic heart failure, NYHA class 2 (HCC) 12/07/2015   Pulmonary HTN (HCC) 12/07/2015   Chronic chest pain 12/07/2015   Ascending aortic aneurysm 12/07/2015   Left sided numbness 12/07/2015   Breast density 11/30/2015   DDD (degenerative disc disease), lumbar 07/07/2015   Bradycardia 05/06/2012   Obesity due to excess calories 04/25/2012   Cystocele 03/22/2012   Diabetes mellitus with coincident hypertension (HCC) 03/22/2012   Essential hypertension 03/22/2012   GERD (gastroesophageal reflux disease) 03/22/2012    Past Surgical History:  Procedure Laterality Date   ABDOMINAL HYSTERECTOMY  2006   Partial, one ovary left    CARDIAC CATHETERIZATION  2004   [place], [place] per pt.   CHOLECYSTECTOMY     EAR CYST EXCISION Left 09/20/2016   Procedure: EXCISION  LEFT EAR LESION;  Surgeon: Newman Pies, MD;  Location: [facility] OR;  Service: ENT;  Laterality: Left;   LEFT HEART CATH AND CORONARY ANGIOGRAPHY N/A 06/10/2021   Procedure: LEFT HEART CATH AND CORONARY ANGIOGRAPHY;  Surgeon: Tonny Bollman, MD;  Location: [facility] INVASIVE CV LAB;  Service: Cardiovascular;  Laterality: N/A;  LEFT HEART CATHETERIZATION WITH CORONARY ANGIOGRAM N/A 01/09/2014   Procedure: LEFT HEART CATHETERIZATION WITH CORONARY ANGIOGRAM;  Surgeon: Marykay Lex, MD;  Location: [facility] CATH LAB;  Service: Cardiovascular;  Laterality: N/A;   MENISCUS REPAIR Right    ROTATOR CUFF REPAIR Left    SKIN SPLIT GRAFT Left 09/20/2016   Procedure: SKIN GRAFT SPLIT THICKNESS;  Surgeon: Newman Pies, MD;  Location: [facility] OR;  Service: ENT;  Laterality: Left;   TEMPOROMANDIBULAR JOINT SURGERY     TONSILLECTOMY     TYMPANOSTOMY TUBE PLACEMENT Left 06/03/2022   WISDOM TOOTH EXTRACTION      OB History     Gravida  4   Para  4   Term  4   Preterm  0    AB  0   Living  4      SAB  0   IAB  0   Ectopic  0   Multiple  0   Live Births           Obstetric Comments  Delivered 4 large babies vaginally          Home Medications    Prior to Admission medications   Medication Sig Start Date End Date Taking? Authorizing Provider  doxycycline (VIBRAMYCIN) 100 MG capsule Take 1 capsule (100 mg total) by mouth 2 (two) times daily for 7 days. 09/15/23 09/22/23 Yes Tomi Bamberger, PA-C  predniSONE (DELTASONE) 20 MG tablet Take 1 tablet (20 mg total) by mouth daily with breakfast for 5 days. 09/15/23 09/20/23 Yes Tomi Bamberger, PA-C  Acetaminophen Extra Strength 500 MG TABS Take 1 tablet by mouth 3 (three) times daily as needed. 06/29/23   [provider]  albuterol (PROVENTIL HFA;VENTOLIN HFA) 108 (90 BASE) MCG/ACT inhaler Inhale 1-2 puffs into the lungs every 6 (six) hours as needed for wheezing. 07/29/13   Reuben Likes, MD  aspirin EC 81 MG tablet Take 81 mg by mouth daily.    [provider]  atorvastatin (LIPITOR) 10 MG tablet Take 10 mg by mouth daily. 08/05/20   [provider]  benzonatate (TESSALON) 100 MG capsule Take 100 mg by mouth 3 (three) times daily as needed. 06/29/23   [provider]  Cholecalciferol (VITAMIN D3) 1000 units CAPS Take 1 capsule by mouth daily.    [provider]  Continuous Glucose Sensor (FREESTYLE LIBRE 2 SENSOR) MISC INJECT 1 SENSOR TO THE SKIN EVERY 14 DAYS FOR CONTINUOUS GLUCOSE MONITORING 02/05/23   [provider]  Continuous Glucose Sensor (FREESTYLE LIBRE 2 SENSOR) MISC Apply topically. 07/17/23   [provider]  Continuous Glucose Sensor (FREESTYLE LIBRE SENSOR SYSTEM) MISC Inject 1 sensor to the skin every 14 days for continuous glucose monitoring. 09/07/21   [provider]  fluticasone (FLONASE) 50 MCG/ACT nasal spray Place 2 sprays into both nostrils as needed for allergies. 04/09/22   [provider]  glimepiride  (AMARYL) 2 MG tablet Take 2 mg by mouth daily. 03/17/22   [provider]  glucose blood (PRECISION QID TEST) test strip 1 each by Other route as needed. 07/07/21   [provider]  hydrochlorothiazide (HYDRODIURIL) 12.5 MG tablet Take 12.5 mg by mouth daily. 09/07/23   [provider]  hydrochlorothiazide (MICROZIDE) 12.5 MG capsule Take 1 capsule (12.5 mg total) by mouth daily. 06/05/23   Sharlene Dory, PA-C  linagliptin (TRADJENTA) 5 MG TABS tablet Take 5 mg by mouth daily.    [provider]  lisinopril (ZESTRIL)  10 MG tablet Take 1 tablet by mouth daily. 07/16/23   [provider]  lisinopril (ZESTRIL) 40 MG tablet Take 1 tablet (40 mg total) by mouth daily. 05/10/23   Sharlene Dory, PA-C  metoprolol succinate (TOPROL-XL) 50 MG 24 hr tablet TAKE 1 TABLET BY MOUTH DAILY WITH OR IMMEDIATELY FOLLOWING A MEAL Patient taking differently: Take 50 mg by mouth daily. 04/13/22   Swinyer, Zachary George, NP  omeprazole (PRILOSEC) 40 MG capsule Take 1 capsule by mouth daily. 08/09/22   [provider]  omeprazole (PRILOSEC) 40 MG capsule Take 40 mg by mouth daily. 08/03/23   [provider]  triamcinolone cream (KENALOG) 0.5 % Apply 1 Application topically 2 (two) times daily. 09/10/23   [provider]  triamcinolone ointment (KENALOG) 0.5 % Apply 1 Application topically daily as needed (for rash). 06/21/21   [provider]    Family History Family History  Problem Relation Age of Onset   Heart disease Father    Valvular heart disease Father        aortic valve replacement   Pancreatitis Mother    Diabetes Maternal Grandmother    Heart disease Brother    Valvular heart disease Brother        aortic valve replacement   CAD Son        heart attack   Kidney disease Son     Social History Social History   Tobacco Use   Smoking status: Never   Smokeless tobacco: Never  Vaping Use   Vaping status: Never Used  Substance  Use Topics   Alcohol use: No   Drug use: Never     Allergies   Lidocaine, Adhesive [tape], Codeine, and Empagliflozin   Review of Systems Review of Systems  Constitutional:  Negative for chills and fever.  Eyes:  Negative for discharge and redness.  Gastrointestinal:  Negative for abdominal pain, nausea and vomiting.  Skin:  Positive for rash.     Physical Exam Triage Vital Signs ED Triage Vitals  Encounter Vitals Group     BP 09/15/23 1254 136/86     Systolic BP Percentile --      Diastolic BP Percentile --      Pulse Rate 09/15/23 1254 67     Resp 09/15/23 1254 18     Temp 09/15/23 1254 97.7 F (36.5 C)     Temp Source 09/15/23 1254 Oral     SpO2 09/15/23 1254 96 %     Weight 09/15/23 1251 181 lb (82.1 kg)     Height 09/15/23 1251 5\' 3"  (1.6 m)     Head Circumference --      Peak Flow --      Pain Score 09/15/23 1247 10     Pain Loc --      Pain Education --      Exclude from Growth Chart --    No data found.  Updated Vital Signs BP 136/86 (BP Location: Left Arm)   Pulse 67   Temp 97.7 F (36.5 C) (Oral)   Resp 18   Ht 5\' 3"  (1.6 m)   Wt 181 lb (82.1 kg)   SpO2 96%   BMI 32.06 kg/m   Visual Acuity Right Eye Distance:   Left Eye Distance:   Bilateral Distance:    Right Eye Near:   Left Eye Near:    Bilateral Near:     Physical Exam Vitals and nursing note reviewed.  Constitutional:  General: She is not in acute distress.    Appearance: Normal appearance. She is not ill-appearing.  HENT:     Head: Normocephalic and atraumatic.  Eyes:     Conjunctiva/sclera: Conjunctivae normal.  Cardiovascular:     Rate and Rhythm: Normal rate.  Pulmonary:     Effort: Pulmonary effort is normal. No respiratory distress.  Skin:    Comments: Erythema with mild associated swelling noted to right wrist area  Neurological:     Mental Status: She is alert.  Psychiatric:        Mood and Affect: Mood normal.        Behavior: Behavior normal.         Thought Content: Thought content normal.      UC Treatments / Results  Labs (all labs ordered are listed, but only abnormal results are displayed) Labs Reviewed - No data to display  EKG   Radiology No results found.  Procedures Procedures (including critical care time)  Medications Ordered in UC Medications - No data to display  Initial Impression / Assessment and Plan / UC Course  I have reviewed the triage vital signs and the nursing notes.  Pertinent labs & imaging results that were available during my care of the patient were reviewed by me and considered in my medical decision making (see chart for details).    Unclear etiology of symptoms but will treat to cover both dermatitis and cellulitis given continued erythema and swelling.  Doxycycline and steroid burst prescribed.  Recommended follow-up if no gradual improvement with any further concerns.  Discussed that steroid may increase glucose levels and to monitor.  Final Clinical Impressions(s) / UC Diagnoses   Final diagnoses:  Dermatitis  Cellulitis of right hand   Discharge Instructions   None    ED Prescriptions     Medication Sig Dispense Auth. Provider   predniSONE (DELTASONE) 20 MG tablet Take 1 tablet (20 mg total) by mouth daily with breakfast for 5 days. 5 tablet Erma Pinto F, PA-C   doxycycline (VIBRAMYCIN) 100 MG capsule Take 1 capsule (100 mg total) by mouth 2 (two) times daily for 7 days. 14 capsule Tomi Bamberger, PA-C      PDMP not reviewed this encounter.   Tomi Bamberger, PA-C 10/13/23 [phone]

## 2023-09-15 NOTE — ED Triage Notes (Signed)
"  I am having a rash with a lot of swelling on my lower right arm/wrist moving up my right arm". I started with this about 2-3 days after getting the Flu vaccine in the Left arm (Deltoid). No rash/swelling/redness around Flu vaccine site. No injury known.

## 2023-10-13 ENCOUNTER — Encounter: Payer: Self-pay | Admitting: Physician Assistant

## 2024-01-01 ENCOUNTER — Other Ambulatory Visit: Payer: Self-pay

## 2024-01-01 ENCOUNTER — Emergency Department (HOSPITAL_COMMUNITY): Payer: Medicare Other

## 2024-01-01 ENCOUNTER — Observation Stay (HOSPITAL_COMMUNITY)
Admission: EM | Admit: 2024-01-01 | Discharge: 2024-01-02 | Disposition: A | Discharge status: Home / Self Care | Payer: Medicare Other | Attending: Internal Medicine | Admitting: Internal Medicine

## 2024-01-01 ENCOUNTER — Encounter (HOSPITAL_COMMUNITY): Payer: Self-pay

## 2024-01-01 DIAGNOSIS — I7121 Aneurysm of the ascending aorta, without rupture: Secondary | ICD-10-CM | POA: Insufficient documentation

## 2024-01-01 DIAGNOSIS — R079 Chest pain, unspecified: Secondary | ICD-10-CM

## 2024-01-01 DIAGNOSIS — G459 Transient cerebral ischemic attack, unspecified: Principal | ICD-10-CM

## 2024-01-01 DIAGNOSIS — I130 Hypertensive heart and chronic kidney disease with heart failure and stage 1 through stage 4 chronic kidney disease, or unspecified chronic kidney disease: Secondary | ICD-10-CM | POA: Diagnosis not present

## 2024-01-01 DIAGNOSIS — E1122 Type 2 diabetes mellitus with diabetic chronic kidney disease: Secondary | ICD-10-CM | POA: Diagnosis not present

## 2024-01-01 DIAGNOSIS — Z7982 Long term (current) use of aspirin: Secondary | ICD-10-CM | POA: Diagnosis not present

## 2024-01-01 DIAGNOSIS — K219 Gastro-esophageal reflux disease without esophagitis: Secondary | ICD-10-CM | POA: Diagnosis present

## 2024-01-01 DIAGNOSIS — R0789 Other chest pain: Secondary | ICD-10-CM | POA: Insufficient documentation

## 2024-01-01 DIAGNOSIS — E119 Type 2 diabetes mellitus without complications: Secondary | ICD-10-CM

## 2024-01-01 DIAGNOSIS — I5032 Chronic diastolic (congestive) heart failure: Secondary | ICD-10-CM | POA: Insufficient documentation

## 2024-01-01 DIAGNOSIS — R471 Dysarthria and anarthria: Secondary | ICD-10-CM | POA: Insufficient documentation

## 2024-01-01 DIAGNOSIS — E7800 Pure hypercholesterolemia, unspecified: Secondary | ICD-10-CM | POA: Diagnosis not present

## 2024-01-01 DIAGNOSIS — R200 Anesthesia of skin: Secondary | ICD-10-CM | POA: Diagnosis not present

## 2024-01-01 DIAGNOSIS — Z79899 Other long term (current) drug therapy: Secondary | ICD-10-CM | POA: Insufficient documentation

## 2024-01-01 DIAGNOSIS — I10 Essential (primary) hypertension: Secondary | ICD-10-CM | POA: Diagnosis present

## 2024-01-01 DIAGNOSIS — E8729 Other acidosis: Secondary | ICD-10-CM | POA: Insufficient documentation

## 2024-01-01 DIAGNOSIS — N1831 Chronic kidney disease, stage 3a: Secondary | ICD-10-CM | POA: Diagnosis not present

## 2024-01-01 LAB — DIFFERENTIAL
Abs Immature Granulocytes: 0.03 10*3/uL (ref 0.00–0.07)
Basophils Absolute: 0 10*3/uL (ref 0.0–0.1)
Basophils Relative: 1 %
Eosinophils Absolute: 0.2 10*3/uL (ref 0.0–0.5)
Eosinophils Relative: 3 %
Immature Granulocytes: 0 %
Lymphocytes Relative: 48 %
Lymphs Abs: 4 10*3/uL (ref 0.7–4.0)
Monocytes Absolute: 0.4 10*3/uL (ref 0.1–1.0)
Monocytes Relative: 5 %
Neutro Abs: 3.5 10*3/uL (ref 1.7–7.7)
Neutrophils Relative %: 43 %

## 2024-01-01 LAB — CBC
HCT: 43.9 % (ref 36.0–46.0)
Hemoglobin: 14.1 g/dL (ref 12.0–15.0)
MCH: 29.5 pg (ref 26.0–34.0)
MCHC: 32.1 g/dL (ref 30.0–36.0)
MCV: 91.8 fL (ref 80.0–100.0)
Platelets: 228 10*3/uL (ref 150–400)
RBC: 4.78 MIL/uL (ref 3.87–5.11)
RDW: 13.5 % (ref 11.5–15.5)
WBC: 8.1 10*3/uL (ref 4.0–10.5)
nRBC: 0 % (ref 0.0–0.2)

## 2024-01-01 LAB — PROTIME-INR
INR: 0.9 (ref 0.8–1.2)
Prothrombin Time: 12.8 s (ref 11.4–15.2)

## 2024-01-01 LAB — COMPREHENSIVE METABOLIC PANEL
ALT: 10 U/L (ref 0–44)
AST: 17 U/L (ref 15–41)
Albumin: 3.7 g/dL (ref 3.5–5.0)
Alkaline Phosphatase: 82 U/L (ref 38–126)
Anion gap: 16 — ABNORMAL HIGH (ref 5–15)
BUN: 22 mg/dL (ref 8–23)
CO2: 19 mmol/L — ABNORMAL LOW (ref 22–32)
Calcium: 9.3 mg/dL (ref 8.9–10.3)
Chloride: 101 mmol/L (ref 98–111)
Creatinine, Ser: 1.22 mg/dL — ABNORMAL HIGH (ref 0.44–1.00)
GFR, Estimated: 45 mL/min — ABNORMAL LOW (ref >60)
Glucose, Bld: 171 mg/dL — ABNORMAL HIGH (ref 70–99)
Potassium: 4.2 mmol/L (ref 3.5–5.1)
Sodium: 136 mmol/L (ref 135–145)
Total Bilirubin: 0.3 mg/dL (ref 0.0–1.2)
Total Protein: 7 g/dL (ref 6.5–8.1)

## 2024-01-01 LAB — ETHANOL: Alcohol, Ethyl (B): <10 mg/dL (ref <10)

## 2024-01-01 LAB — TROPONIN I (HIGH SENSITIVITY)
Troponin I (High Sensitivity): 4 ng/L (ref <18)
Troponin I (High Sensitivity): 6 ng/L (ref <18)

## 2024-01-01 LAB — I-STAT CHEM 8, ED
BUN: 24 mg/dL — ABNORMAL HIGH (ref 8–23)
Calcium, Ion: 1.06 mmol/L — ABNORMAL LOW (ref 1.15–1.40)
Chloride: 103 mmol/L (ref 98–111)
Creatinine, Ser: 1.3 mg/dL — ABNORMAL HIGH (ref 0.44–1.00)
Glucose, Bld: 176 mg/dL — ABNORMAL HIGH (ref 70–99)
HCT: 42 % (ref 36.0–46.0)
Hemoglobin: 14.3 g/dL (ref 12.0–15.0)
Potassium: 4.2 mmol/L (ref 3.5–5.1)
Sodium: 137 mmol/L (ref 135–145)
TCO2: 24 mmol/L (ref 22–32)

## 2024-01-01 LAB — GLUCOSE, CAPILLARY: Glucose-Capillary: 136 mg/dL — ABNORMAL HIGH (ref 70–99)

## 2024-01-01 LAB — HEMOGLOBIN A1C
Hgb A1c MFr Bld: 8.2 % — ABNORMAL HIGH (ref 4.8–5.6)
Mean Plasma Glucose: 188.64 mg/dL

## 2024-01-01 LAB — CBG MONITORING, ED: Glucose-Capillary: 165 mg/dL — ABNORMAL HIGH (ref 70–99)

## 2024-01-01 LAB — MAGNESIUM: Magnesium: 1.7 mg/dL (ref 1.7–2.4)

## 2024-01-01 LAB — LIPASE, BLOOD: Lipase: 43 U/L (ref 11–51)

## 2024-01-01 LAB — APTT: aPTT: <22 s — ABNORMAL LOW (ref 24–36)

## 2024-01-01 MED ORDER — IOHEXOL 350 MG/ML SOLN
75.0000 mL | Freq: Once | INTRAVENOUS | Status: AC | PRN
  Administered 2024-01-01: 75 mL via INTRAVENOUS

## 2024-01-01 MED ORDER — LORAZEPAM 2 MG/ML IJ SOLN
1.0000 mg | Freq: Once | INTRAMUSCULAR | Status: AC | PRN
  Administered 2024-01-01: 1 mg via INTRAVENOUS
  Filled 2024-01-01: qty 1

## 2024-01-01 MED ORDER — HYDRALAZINE HCL 20 MG/ML IJ SOLN: 10.0000 mg | INTRAMUSCULAR | Status: DC | PRN

## 2024-01-01 MED ORDER — PANTOPRAZOLE SODIUM 40 MG PO TBEC
40.0000 mg | DELAYED_RELEASE_TABLET | Freq: Every day | ORAL | Status: DC
  Administered 2024-01-02: 40 mg via ORAL
  Filled 2024-01-01: qty 1

## 2024-01-01 MED ORDER — ACETAMINOPHEN 325 MG PO TABS
650.0000 mg | ORAL_TABLET | Freq: Four times a day (QID) | ORAL | Status: DC | PRN
  Administered 2024-01-01: 650 mg via ORAL
  Filled 2024-01-01: qty 2

## 2024-01-01 MED ORDER — ONDANSETRON HCL 4 MG/2ML IJ SOLN
4.0000 mg | Freq: Four times a day (QID) | INTRAMUSCULAR | Status: DC | PRN
Start: 2024-01-01 — End: 2024-01-02

## 2024-01-01 MED ORDER — STROKE: EARLY STAGES OF RECOVERY BOOK
Freq: Once | Status: AC
  Filled 2024-01-01: qty 1

## 2024-01-01 MED ORDER — ACETAMINOPHEN 650 MG RE SUPP: 650.0000 mg | Freq: Four times a day (QID) | RECTAL | Status: DC | PRN

## 2024-01-01 MED ORDER — OXYCODONE HCL 5 MG PO TABS: 5.0000 mg | ORAL_TABLET | ORAL | Status: DC | PRN

## 2024-01-01 MED ORDER — MELATONIN 3 MG PO TABS: 3.0000 mg | ORAL_TABLET | Freq: Every evening | ORAL | Status: DC | PRN

## 2024-01-01 MED ORDER — SODIUM CHLORIDE 0.9% FLUSH: 3.0000 mL | Freq: Once | INTRAVENOUS | Status: DC

## 2024-01-01 MED ORDER — ASPIRIN 81 MG PO TBEC
81.0000 mg | DELAYED_RELEASE_TABLET | Freq: Every day | ORAL | Status: DC
  Administered 2024-01-02: 81 mg via ORAL
  Filled 2024-01-01: qty 1

## 2024-01-01 MED ORDER — ATORVASTATIN CALCIUM 10 MG PO TABS
10.0000 mg | ORAL_TABLET | Freq: Every day | ORAL | Status: DC
  Administered 2024-01-02: 10 mg via ORAL
  Filled 2024-01-01: qty 1

## 2024-01-01 MED ORDER — INSULIN ASPART 100 UNIT/ML IJ SOLN
0.0000 [IU] | Freq: Three times a day (TID) | INTRAMUSCULAR | Status: DC
  Administered 2024-01-02: 1 [IU] via SUBCUTANEOUS
  Administered 2024-01-02: 2 [IU] via SUBCUTANEOUS

## 2024-01-01 NOTE — ED Notes (Signed)
Pt to MRI

## 2024-01-01 NOTE — Code Documentation (Signed)
[name] [name] is a 78 yr old female arriving to [facility] on 01/01/2024. She has a PMH of  DM, aortic aneurysm, prior CVA. Pt is coming from home where she was LKW today at 1530, at which time her chest started hurting. Her left face and arm  began to have decreased sensation at that time. Code stroke activated by EMS for unilateral numbness. Pt is not on any thinners.    Pt met by stroke team at the bridge. Labs, CBG obtained, airway cleared by Dr Lynelle Doctor. Pt to CT with team. NIHSS 4, pt with drift in bilateral legs, sensory loss on left, and dysarthria. CT head performed. Per Dr. Derry Lory, CT is negative for acute hemorrhage.     Pt back to ED room 15 where her workup will continue.Pt is currently too mild to treat with thrombolytic. Therefore, she will need Q 30 minute NIHSS and VS until she is outside of the treatment window at 20:00. At that time she will need q 2 VS and NIHSS for 12 hours, then q 4. Pt not candidate for NIR as she has no LVO symptoms. Bedside handoff with Marcello Moores RN complete.

## 2024-01-01 NOTE — ED Notes (Signed)
Pt passed swallow screen with no choking or stopping drinking. Afterwards patient stated she felt like there was a lump in her throat. She stated she did not feel like she was choking.

## 2024-01-01 NOTE — Progress Notes (Signed)
Pt admitted from ED with stroke symptoms, alert and oriented c/o of pain in the middle chest, pt settled in bed with call light at bedside, tele monitor put and verified on pt, Safety concern addressed accordingly, was however reassured and will continue to monitor, v/s stable. Obasogie-Asidi, Alissah Redmon Efe

## 2024-01-01 NOTE — ED Notes (Signed)
This phlebotomist stuck patient twice and was unable to collect labs

## 2024-01-01 NOTE — Consult Note (Signed)
NEUROLOGY CONSULT NOTE   Date of service: January 01, 2024 Patient Name: [name] [name] MRN:  [id number] DOB:  10-Jun-1946 Chief Complaint: "chest pain, numbness of left face and arm " Requesting Provider: Linwood Dibbles, MD  History of Present Illness  [name] [name] is a 78 y.o. female with hx of prior CVA, DM, HTN, HLD, GERD who presents to [facility] via [facility] for evaluation of acute onset of chest pain, left face and arm numbness. Code stroke was activated by EMS. Per EMS and patient LKW 1530, she developed chest pain while cooking and then some left sided face numbness and left arm numbness, that is intermittently coming and going. NIHSS 4. Code stroke CT head with no acute process, aspects 10.  Patient state she is having trouble with getting her words out, with dysarthria, denies any weakness or vision changes.   LKW: 1530 Modified rankin score: 1-No significant post stroke disability and can perform usual duties with stroke symptoms IV Thrombolysis:  No mild symptoms  EVT:  No LVO   NIHSS components Score: Comment  1a Level of Conscious 0[x]  1[]  2[]  3[]      1b LOC Questions 0[x]  1[]  2[]       1c LOC Commands 0[x]  1[]  2[]       2 Best Gaze 0[x]  1[]  2[]       3 Visual 0[x]  1[]  2[]  3[]      4 Facial Palsy 0[x]  1[]  2[]  3[]      5a Motor Arm - left 0[x]  1[]  2[]  3[]  4[]  UN[]    5b Motor Arm - Right 0[x]  1[]  2[]  3[]  4[]  UN[]    6a Motor Leg - Left 0[]  1[x]  2[]  3[]  4[]  UN[]    6b Motor Leg - Right 0[]  1[x]  2[]  3[]  4[]  UN[]    7 Limb Ataxia 0[x]  1[]  2[]  3[]  UN[]     8 Sensory 0[]  1[x]  2[]  UN[]      9 Best Language 0[x]  1[]  2[]  3[]      10 Dysarthria 0[]  1[x]  2[]  UN[]      11 Extinct. and Inattention 0[x]  1[]  2[]       TOTAL:  4      ROS  Comprehensive ROS performed and pertinent positives documented in HPI    Past History   Past Medical History:  Diagnosis Date   Arthritis    Bronchitis    hx   Chest pain 2009   MV with no scar or ischemia, EF 65%   Complication of anesthesia     claustrophobic !   CVA (cerebral vascular accident) (HCC) 05/05/2012   Diabetes mellitus    GERD (gastroesophageal reflux disease)    History of cardiac catheterization    a. Myoview 2/15 with anteroapical ischemia, EF 64% (intermediate risk) >> LHC - normal cos, EF 55-60%   Hypertension    Hypokalemia 06/10/2021   Neuromuscular disorder (HCC)    arotic Aneurysm   Obesity (BMI 35.0-39.9 without comorbidity)    Pneumonia    Stroke [facility])    with some resid L sided weakness   Thoracic ascending aortic aneurysm (HCC)    a. CT 1/17: ascending aorta 4.2 x 4.0 cm    Past Surgical History:  Procedure Laterality Date   ABDOMINAL HYSTERECTOMY  2006   Partial, one ovary left    CARDIAC CATHETERIZATION  2004   [place], [place] per pt.   CHOLECYSTECTOMY     EAR CYST EXCISION Left 09/20/2016   Procedure: EXCISION  LEFT EAR LESION;  Surgeon: Newman Pies, MD;  Location: [facility]  OR;  Service: ENT;  Laterality: Left;   LEFT HEART CATH AND CORONARY ANGIOGRAPHY N/A 06/10/2021   Procedure: LEFT HEART CATH AND CORONARY ANGIOGRAPHY;  Surgeon: Tonny Bollman, MD;  Location: [facility] INVASIVE CV LAB;  Service: Cardiovascular;  Laterality: N/A;   LEFT HEART CATHETERIZATION WITH CORONARY ANGIOGRAM N/A 01/09/2014   Procedure: LEFT HEART CATHETERIZATION WITH CORONARY ANGIOGRAM;  Surgeon: Marykay Lex, MD;  Location: [facility] CATH LAB;  Service: Cardiovascular;  Laterality: N/A;   MENISCUS REPAIR Right    ROTATOR CUFF REPAIR Left    SKIN SPLIT GRAFT Left 09/20/2016   Procedure: SKIN GRAFT SPLIT THICKNESS;  Surgeon: Newman Pies, MD;  Location: [facility] OR;  Service: ENT;  Laterality: Left;   TEMPOROMANDIBULAR JOINT SURGERY     TONSILLECTOMY     TYMPANOSTOMY TUBE PLACEMENT Left 06/03/2022   WISDOM TOOTH EXTRACTION      Family History: Family History  Problem Relation Age of Onset   Heart disease Father    Valvular heart disease Father        aortic valve replacement   Pancreatitis Mother    Diabetes Maternal Grandmother     Heart disease Brother    Valvular heart disease Brother        aortic valve replacement   CAD Son        heart attack   Kidney disease Son     Social History  reports that she has never smoked. She has never used smokeless tobacco. She reports that she does not drink alcohol and does not use drugs.  Allergies  Allergen Reactions   Lidocaine     "Messed with my nerves".   Adhesive [Tape] Rash    pls use elastic wraps instead of adhesive tape   Codeine Nausea And Vomiting   Empagliflozin Other (See Comments)    Headaches & lower back pain    Medications   Current Facility-Administered Medications:    sodium chloride flush (NS) 0.9 % injection 3 mL, 3 mL, Intravenous, Once, Linwood Dibbles, MD  Current Outpatient Medications:    Acetaminophen Extra Strength 500 MG TABS, Take 1 tablet by mouth 3 (three) times daily as needed., Disp: , Rfl:    albuterol (PROVENTIL HFA;VENTOLIN HFA) 108 (90 BASE) MCG/ACT inhaler, Inhale 1-2 puffs into the lungs every 6 (six) hours as needed for wheezing., Disp: 1 Inhaler, Rfl: 0   aspirin EC 81 MG tablet, Take 81 mg by mouth daily., Disp: , Rfl:    atorvastatin (LIPITOR) 10 MG tablet, Take 10 mg by mouth daily., Disp: , Rfl:    benzonatate (TESSALON) 100 MG capsule, Take 100 mg by mouth 3 (three) times daily as needed., Disp: , Rfl:    Cholecalciferol (VITAMIN D3) 1000 units CAPS, Take 1 capsule by mouth daily., Disp: , Rfl:    Continuous Glucose Sensor (FREESTYLE LIBRE 2 SENSOR) MISC, INJECT 1 SENSOR TO THE SKIN EVERY 14 DAYS FOR CONTINUOUS GLUCOSE MONITORING, Disp: , Rfl:    Continuous Glucose Sensor (FREESTYLE LIBRE 2 SENSOR) MISC, Apply topically., Disp: , Rfl:    Continuous Glucose Sensor (FREESTYLE LIBRE SENSOR SYSTEM) MISC, Inject 1 sensor to the skin every 14 days for continuous glucose monitoring., Disp: , Rfl:    fluticasone (FLONASE) 50 MCG/ACT nasal spray, Place 2 sprays into both nostrils as needed for allergies., Disp: , Rfl:     glimepiride (AMARYL) 2 MG tablet, Take 2 mg by mouth daily., Disp: , Rfl:    glucose blood (PRECISION QID TEST) test strip, 1 each by  Other route as needed., Disp: , Rfl:    hydrochlorothiazide (HYDRODIURIL) 12.5 MG tablet, Take 12.5 mg by mouth daily., Disp: , Rfl:    hydrochlorothiazide (MICROZIDE) 12.5 MG capsule, Take 1 capsule (12.5 mg total) by mouth daily., Disp: 90 capsule, Rfl: 3   linagliptin (TRADJENTA) 5 MG TABS tablet, Take 5 mg by mouth daily., Disp: , Rfl:    lisinopril (ZESTRIL) 10 MG tablet, Take 1 tablet by mouth daily., Disp: , Rfl:    lisinopril (ZESTRIL) 40 MG tablet, Take 1 tablet (40 mg total) by mouth daily., Disp: 90 tablet, Rfl: 3   metoprolol succinate (TOPROL-XL) 50 MG 24 hr tablet, TAKE 1 TABLET BY MOUTH DAILY WITH OR IMMEDIATELY FOLLOWING A MEAL (Patient taking differently: Take 50 mg by mouth daily.), Disp: 90 tablet, Rfl: 3   omeprazole (PRILOSEC) 40 MG capsule, Take 1 capsule by mouth daily., Disp: , Rfl:    omeprazole (PRILOSEC) 40 MG capsule, Take 40 mg by mouth daily., Disp: , Rfl:    triamcinolone cream (KENALOG) 0.5 %, Apply 1 Application topically 2 (two) times daily., Disp: , Rfl:    triamcinolone ointment (KENALOG) 0.5 %, Apply 1 Application topically daily as needed (for rash)., Disp: , Rfl:   Vitals  There were no vitals filed for this visit.  There is no height or weight on file to calculate BMI.  Physical Exam   Constitutional: Appears well-developed and well-nourished.   Psych: Affect appropriate to situation.   Eyes: No scleral injection.   HENT: No OP obstruction.   Head: Normocephalic.   Cardiovascular: Normal rate and regular rhythm.   Respiratory: Effort normal, non-labored breathing.   GI: Soft.  No distension. There is no tenderness.   Skin: WDI.    Neurologic Examination   Mental Status -  Level of arousal and orientation to time, place, and person were intact. Language including expression, naming, repetition, comprehension was  assessed and found intact. Attention span and concentration were normal. Recent and remote memory were intact. Fund of Knowledge was assessed and was intact.  Cranial Nerves II - XII - II - Visual field intact OU. III, IV, VI - Extraocular movements intact. V - Facial sensation intact bilaterally. VII - Facial movement intact bilaterally. VIII - Hearing & vestibular intact bilaterally. X - Palate elevates symmetrically. XI - Chin turning & shoulder shrug intact bilaterally. XII - Tongue protrusion intact.  Motor Strength - bilateral uppers 5/5 with no drift, bilateral lowers 4/5 with drift   Bulk was normal and fasciculations were absent .   Motor Tone - Muscle tone was assessed at the neck and appendages and was normal . Sensory - decreased on left arm   Coordination - The patient had normal movements in the hands and feet with no ataxia or dysmetria.  Tremor was absent. Gait and Station - deferred.  Labs/Imaging/Neurodiagnostic studies   CBC: No results for input(s): "WBC", "NEUTROABS", "HGB", "HCT", "MCV", "PLT" in the last 168 hours. Basic Metabolic Panel:  Lab Results  Component Value Date   NA 137 05/14/2023   K 3.5 05/14/2023   CO2 21 (L) 05/14/2023   GLUCOSE 108 (H) 05/14/2023   BUN 16 05/14/2023   CREATININE 1.23 (H) 05/14/2023   CALCIUM 8.9 05/14/2023   GFRNONAA 45 (L) 05/14/2023   GFRAA 57 (L) 05/02/2020   Lipid Panel:  Lab Results  Component Value Date   LDLCALC 63 06/10/2021   HgbA1c:  Lab Results  Component Value Date   HGBA1C 7.3 (H) 06/09/2021  Urine Drug Screen:     Component Value Date/Time   LABOPIA NONE DETECTED 08/28/2017 1905   COCAINSCRNUR NONE DETECTED 08/28/2017 1905   LABBENZ NONE DETECTED 08/28/2017 1905   AMPHETMU NONE DETECTED 08/28/2017 1905   THCU NONE DETECTED 08/28/2017 1905   LABBARB NONE DETECTED 08/28/2017 1905    Alcohol Level     Component Value Date/Time   ETH <10 05/14/2023 2146   INR  Lab Results  Component  Value Date   INR 1.05 12/15/2018   APTT  Lab Results  Component Value Date   APTT 30 12/15/2018   AED levels: No results found for: "PHENYTOIN", "ZONISAMIDE", "LAMOTRIGINE", "LEVETIRACETA"  CT Head without contrast(Personally reviewed): No acute process, aspects 10    ASSESSMENT   Sindhu L Alberto is a 78 y.o. female  hx of prior CVA, DM, HTN, HLD, GERD who presents to [facility] for evaluation of acute onset of chest pain, intermittent left face and arm numbness. NIHSS 4.  Onset of left sided numbness with chest pain is somewhat atypical of strokes/TIA. Has a hx of strokes and risk factors and therefore will get MRI Brain to evaluate this further. If MRI Brain does not show a stroke, no further stroke workup.  Will defer need for cardiac workup to primary team/ED team.  RECOMMENDATIONS   - MRI brain w/o to evaluate for acute process. If positive for stroke with continue with stroke workup. - Q30 mins Neuro Checks until outside 4.5 hours window or if MRI Brain w/o contrast is negative. ______________________________________________________________________  Gevena Mart DNP, ACNPC-AG  [facility]   NEUROHOSPITALIST ADDENDUM Performed a face to face diagnostic evaluation.   I have reviewed the contents of history and physical exam as documented by PA/ARNP/Resident and agree with above documentation.  I have discussed and formulated the above plan as documented. Edits to the note have been made as needed.  Erick Blinks, MD [facility] [phone]   If 7pm to 7am, please call on call as listed on AMION.

## 2024-01-01 NOTE — ED Provider Notes (Signed)
[facility]GENC[facility] DEPARTMENT AT [facility] Provider Note   CSN: [id number] Arrival date & time: 01/01/24  1703  An emergency department physician performed an initial assessment on this suspected stroke patient at 22 (met [place] in [place]).  History {Add pertinent medical, surgical, social history, OB history to HPI:1} Chief Complaint  Patient presents with   Code Stroke   Chest Pain    [name] [name] is a 78 y.o. female.   Chest Pain    Patient has a history of of hypertension diabetes acid reflux thoracic aortic aneurysm, pneumonia, stroke, neuromuscular disorder hypokalemia.  Patient presents ED for evaluation of chest pain and numbness that started a couple hours ago.  Patient states symptoms started around 330.  Patient states she started having pain in her lower chest.  It radiates to the left side.  Patient also started developing some weakness and numbness on the left side.  Patient states his symptoms are now improving.  Home Medications Prior to Admission medications   Medication Sig Start Date End Date Taking? Authorizing Provider  Acetaminophen Extra Strength 500 MG TABS Take 1 tablet by mouth 3 (three) times daily as needed. 06/29/23   [provider]  albuterol (PROVENTIL HFA;VENTOLIN HFA) 108 (90 BASE) MCG/ACT inhaler Inhale 1-2 puffs into the lungs every 6 (six) hours as needed for wheezing. 07/29/13   Reuben Likes, MD  aspirin EC 81 MG tablet Take 81 mg by mouth daily.    [provider]  atorvastatin (LIPITOR) 10 MG tablet Take 10 mg by mouth daily. 08/05/20   [provider]  benzonatate (TESSALON) 100 MG capsule Take 100 mg by mouth 3 (three) times daily as needed. 06/29/23   [provider]  Cholecalciferol (VITAMIN D3) 1000 units CAPS Take 1 capsule by mouth daily.    [provider]  Continuous Glucose Sensor (FREESTYLE LIBRE 2 SENSOR) MISC INJECT 1 SENSOR TO THE SKIN EVERY 14 DAYS FOR CONTINUOUS GLUCOSE  MONITORING 02/05/23   [provider]  Continuous Glucose Sensor (FREESTYLE LIBRE 2 SENSOR) MISC Apply topically. 07/17/23   [provider]  Continuous Glucose Sensor (FREESTYLE LIBRE SENSOR SYSTEM) MISC Inject 1 sensor to the skin every 14 days for continuous glucose monitoring. 09/07/21   [provider]  fluticasone (FLONASE) 50 MCG/ACT nasal spray Place 2 sprays into both nostrils as needed for allergies. 04/09/22   [provider]  glimepiride (AMARYL) 2 MG tablet Take 2 mg by mouth daily. 03/17/22   [provider]  glucose blood (PRECISION QID TEST) test strip 1 each by Other route as needed. 07/07/21   [provider]  hydrochlorothiazide (HYDRODIURIL) 12.5 MG tablet Take 12.5 mg by mouth daily. 09/07/23   [provider]  hydrochlorothiazide (MICROZIDE) 12.5 MG capsule Take 1 capsule (12.5 mg total) by mouth daily. 06/05/23   Sharlene Dory, PA-C  linagliptin (TRADJENTA) 5 MG TABS tablet Take 5 mg by mouth daily.    [provider]  lisinopril (ZESTRIL) 10 MG tablet Take 1 tablet by mouth daily. 07/16/23   [provider]  lisinopril (ZESTRIL) 40 MG tablet Take 1 tablet (40 mg total) by mouth daily. 05/10/23   Sharlene Dory, PA-C  metoprolol succinate (TOPROL-XL) 50 MG 24 hr tablet TAKE 1 TABLET BY MOUTH DAILY WITH OR IMMEDIATELY FOLLOWING A MEAL Patient taking differently: Take 50 mg by mouth daily. 04/13/22   Swinyer, Zachary George, NP  omeprazole (PRILOSEC) 40 MG capsule Take 1 capsule by mouth  daily. 08/09/22   [provider]  omeprazole (PRILOSEC) 40 MG capsule Take 40 mg by mouth daily. 08/03/23   [provider]  triamcinolone cream (KENALOG) 0.5 % Apply 1 Application topically 2 (two) times daily. 09/10/23   [provider]  triamcinolone ointment (KENALOG) 0.5 % Apply 1 Application topically daily as needed (for rash). 06/21/21   [provider]      Allergies    Lidocaine,  Adhesive [tape], Codeine, and Empagliflozin    Review of Systems   Review of Systems  Cardiovascular:  Positive for chest pain.    Physical Exam Updated Vital Signs BP 138/88   Pulse (!) 56   Temp 98.1 F (36.7 C) (Oral)   Resp (!) 22   Ht 1.6 m (5\' 3" )   SpO2 98%   BMI 32.06 kg/m  Physical Exam Vitals and nursing note reviewed.  Constitutional:      Appearance: She is well-developed. She is not diaphoretic.  HENT:     Head: Normocephalic and atraumatic.     Right Ear: External ear normal.     Left Ear: External ear normal.  Eyes:     General: No scleral icterus.       Right eye: No discharge.        Left eye: No discharge.     Conjunctiva/sclera: Conjunctivae normal.  Neck:     Trachea: No tracheal deviation.  Cardiovascular:     Rate and Rhythm: Normal rate and regular rhythm.  Pulmonary:     Effort: Pulmonary effort is normal. No respiratory distress.     Breath sounds: Normal breath sounds. No stridor. No wheezing or rales.  Abdominal:     General: Bowel sounds are normal. There is no distension.     Palpations: Abdomen is soft.     Tenderness: There is no abdominal tenderness. There is no guarding or rebound.  Musculoskeletal:        General: No tenderness or deformity.     Cervical back: Neck supple.  Skin:    General: Skin is warm and dry.     Findings: No rash.  Neurological:     General: No focal deficit present.     Mental Status: She is alert.     Cranial Nerves: No cranial nerve deficit, dysarthria or facial asymmetry.     Sensory: No sensory deficit.     Motor: No abnormal muscle tone or seizure activity.     Coordination: Coordination normal.     Comments: Mild drift bilateral lower extremities, equal grip strength, no facial droop  Psychiatric:        Mood and Affect: Mood normal.     ED Results / Procedures / Treatments   Labs (all labs ordered are listed, but only abnormal results are displayed) Labs Reviewed  APTT - Abnormal; Notable  for the following components:      Result Value   aPTT <22 (*)    All other components within normal limits  COMPREHENSIVE METABOLIC PANEL - Abnormal; Notable for the following components:   CO2 19 (*)    Glucose, Bld 171 (*)    Creatinine, Ser 1.22 (*)    GFR, Estimated 45 (*)    Anion gap 16 (*)    All other components within normal limits  I-STAT CHEM 8, ED - Abnormal; Notable for the following components:   BUN 24 (*)    Creatinine, Ser 1.30 (*)    Glucose, Bld 176 (*)  Calcium, Ion 1.06 (*)    All other components within normal limits  CBG MONITORING, ED - Abnormal; Notable for the following components:   Glucose-Capillary 165 (*)    All other components within normal limits  PROTIME-INR  CBC  DIFFERENTIAL  ETHANOL  LIPASE, BLOOD  TROPONIN I (HIGH SENSITIVITY)  TROPONIN I (HIGH SENSITIVITY)    EKG EKG Interpretation Date/Time:  Tuesday January 01 2024 17:25:15 EST Ventricular Rate:  58 PR Interval:  163 QRS Duration:  105 QT Interval:  490 QTC Calculation: 482 R Axis:   -34  Text Interpretation: Sinus rhythm Left axis deviation Abnormal T, consider ischemia, anterior leads No significant change since last tracing Confirmed by Linwood Dibbles [phone]) on 01/01/2024 8:43:45 PM  Radiology CT Angio Chest Aorta W and/or Wo Contrast Result Date: 01/01/2024 CLINICAL DATA:  Acute aortic syndrome suspected, Stroke symptoms EXAM: CT ANGIOGRAPHY CHEST WITH CONTRAST TECHNIQUE: Multidetector CT imaging of the chest was performed using the standard protocol during bolus administration of intravenous contrast. Multiplanar CT image reconstructions and MIPs were obtained to evaluate the vascular anatomy. RADIATION DOSE REDUCTION: This exam was performed according to the departmental dose-optimization program which includes automated exposure control, adjustment of the mA and/or kV according to patient size and/or use of iterative reconstruction technique. CONTRAST:  75mL OMNIPAQUE IOHEXOL  350 MG/ML SOLN COMPARISON:  06/01/2023 FINDINGS: Cardiovascular: 4 cm ascending thoracic aortic aneurysm. No evidence of dissection. Atherosclerosis of the aortic arch. There is technically adequate opacification of the pulmonary vasculature. No filling defects or pulmonary emboli. The heart is enlarged, without pericardial effusion. Mediastinum/Nodes: No enlarged mediastinal, hilar, or axillary lymph nodes. Thyroid gland, trachea, and esophagus demonstrate no significant findings. Lungs/Pleura: No acute airspace disease, effusion, or pneumothorax. Hypoventilatory changes at the lung bases. Central airways are patent. Upper Abdomen: No acute abnormality. Musculoskeletal: No acute or destructive bony abnormalities. Reconstructed images demonstrate no additional findings. Review of the MIP images confirms the above findings. IMPRESSION: 1. 4 cm ascending thoracic aortic aneurysm. No evidence of dissection. Recommend annual imaging followup by CTA or MRA. This recommendation follows 2010 ACCF/AHA/AATS/ACR/ASA/SCA/SCAI/SIR/STS/SVM Guidelines for the Diagnosis and Management of Patients with Thoracic Aortic Disease. Circulation. 2010; 121: U045-W098. Aortic aneurysm NOS (ICD10-I71.9) 2. No evidence of pulmonary embolus. 3. Cardiomegaly. 4.  Aortic Atherosclerosis (ICD10-I70.0). Electronically Signed   By: Sharlet Salina M.D.   On: 01/01/2024 20:28   MR BRAIN WO CONTRAST Result Date: 01/01/2024 CLINICAL DATA:  Neuro deficit, acute, stroke suspected EXAM: MRI HEAD WITHOUT CONTRAST TECHNIQUE: Multiplanar, multiecho pulse sequences of the brain and surrounding structures were obtained without intravenous contrast. COMPARISON:  CT head February 11, 25. FINDINGS: Brain: No acute infarction, hemorrhage, hydrocephalus, extra-axial collection or mass lesion. Mild for age T2/FLAIR hyperintensities the white matter, nonspecific but compatible with chronic microvascular ischemic disease. Small remote infarct in the right anterior  insula. Vascular: Major arterial flow voids are maintained at the skull base. Skull and upper cervical spine: Normal marrow signal. Sinuses/Orbits: Right maxillary sinus mucosal thickening and air-fluid level. No acute orbital findings. Other: No mastoid effusions. IMPRESSION: 1. No evidence of acute intracranial abnormality. 2. 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 3. Right maxillary sinus mucosal thickening and air-fluid level. Electronically Signed   By: Feliberto Harts M.D.   On: 01/01/2024 19:48   CT HEAD CODE STROKE WO CONTRAST Result Date: 01/01/2024 CLINICAL DATA:  Code stroke. 78 year old female. Left side deficit. EXAM: CT HEAD WITHOUT CONTRAST TECHNIQUE: Contiguous axial images were obtained from the base of  the skull through the vertex without intravenous contrast. RADIATION DOSE REDUCTION: This exam was performed according to the departmental dose-optimization program which includes automated exposure control, adjustment of the mA and/or kV according to patient size and/or use of iterative reconstruction technique. COMPARISON:  Brain MRI and Head CT 05/14/2023. FINDINGS: Brain: Cerebral volume appears stable and normal for age. No midline shift, mass effect, or evidence of intracranial mass lesion. No ventriculomegaly. No acute intracranial hemorrhage identified. No cortically based acute infarct identified. Stable gray-white matter differentiation throughout the brain. Mild for age mostly left inferior frontal gyrus patchy white matter hypodensity. Vascular: No suspicious intracranial vascular hyperdensity. Skull: Stable and intact. Sinuses/Orbits: Chronic right maxillary sinusitis, otherwise well aerated. Other: Visualized orbits and scalp soft tissues are within normal limits. ASPECTS [facility] Stroke Program Early CT Score) Total score (0-10 with 10 being normal): 10 IMPRESSION: 1. No acute cortically based infarct or acute intracranial hemorrhage identified. ASPECTS  10. 2. Stable non contrast CT appearance of the brain since last year, mild for age white matter disease. 3. Chronic right maxillary sinusitis. 4. Salient findings were communicated to Dr. Derry Lory at 5:21 pm on 01/01/2024 by text page via the [facility] messaging system. Electronically Signed   By: Odessa Fleming M.D.   On: 01/01/2024 17:21    Procedures Procedures  {Document cardiac monitor, telemetry assessment procedure when appropriate:1}  Medications Ordered in ED Medications  sodium chloride flush (NS) 0.9 % injection 3 mL (3 mLs Intravenous Not Given 01/01/24 1745)  LORazepam (ATIVAN) injection 1 mg (1 mg Intravenous Given 01/01/24 1837)  iohexol (OMNIPAQUE) 350 MG/ML injection 75 mL (75 mLs Intravenous Contrast Given 01/01/24 1950)    ED Course/ Medical Decision Making/ A&P Clinical Course as of 01/01/24 2045  Tue Jan 01, 2024  1942 CBC normal.  Metabolic panel shows increased creatinine similar to previous. [JK]  2041 CT angio shows 4 cm ascending thoracic aortic aneurysm.  No evidence of dissection or other acute abnormality [JK]  2042 MRI does not show any signs of acute stroke [JK]    Clinical Course User Index [JK] Linwood Dibbles, MD   {   Click here for ABCD2, HEART and other calculatorsREFRESH Note before signing :1}                              Medical Decision Making Problems Addressed: Chest pain, unspecified type: acute illness or injury that poses a threat to life or bodily functions TIA (transient ischemic attack): acute illness or injury that poses a threat to life or bodily functions  Amount and/or Complexity of Data Reviewed Labs: ordered. Decision-making details documented in ED Course. Radiology: ordered and independent interpretation performed.  Risk Prescription drug management.   Patient presented to the ED for evaluation of chest pain as well as numbness.  Patient was activated as a code stroke by EMS.  She was seen on arrival by the stroke team including Dr  Newman Nip.  Patient symptoms improving.  No indication for tPA.  With her chest pain is concerned about the possibility of aortic dissection.  CT angio does not show any signs of dissection.  Her aneurysm is stable.  Serial troponins are normal.  No signs of ACS.  Plan on admission to the hospital for further TIA stroke workup.   {Document critical care time when appropriate:1} {Document review of labs and clinical decision tools ie heart score, Chads2Vasc2 etc:1}  {Document your independent review of radiology  images, and any outside records:1} {Document your discussion with family members, caretakers, and with consultants:1} {Document social determinants of health affecting pt's care:1} {Document your decision making why or why not admission, treatments were needed:1} Final Clinical Impression(s) / ED Diagnoses Final diagnoses:  TIA (transient ischemic attack)  Chest pain, unspecified type    Rx / DC Orders ED Discharge Orders     None

## 2024-01-01 NOTE — ED Triage Notes (Addendum)
Pt arrives via EMS from home where she was LKW today at 1530, at which time her chest started hurting. Her left face and arm began to have decreased sensation at that time. Code stroke activated by EMS for unilateral numbness. Pt is not on any thinners. She arrives AxOx4.

## 2024-01-01 NOTE — ED Notes (Signed)
Pt stuck x1 with this nurse with no success, Phlebotomy stuck x2 with no success. MD advised and IV team consult placed.

## 2024-01-01 NOTE — H&P (Signed)
History and Physical      [name] [name] ZOX:[id number] DOB: July 10, 1946 DOA: 01/01/2024; DOS: 01/01/2024  PCP: Lourena Simmonds, Donald Pore, MD  Patient coming from: home   I have personally briefly reviewed patient's old medical records in [facility]  Chief Complaint: left-sided numbness  HPI: [name] [name] is a 78 y.o. 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   The patient reports that she was cooking at home at approximately 1530 on 12/31/2023, at which time she developed sudden onset to numbness involving the left portion of her face as well as the left arm, in the absence of any associated acute focal weakness.  She otherwise denies of any additional acute focal sensory changes.  She notes that this was also associated with mild speech slurring, which she states was new for her.  Denies any associated acute change in vision, nor any associated vertigo, dysphagia.  She conveys that the dysarthria has resolved and the left-sided numbness has significantly improved, noting very mild residual numbness involving the left upper extremity.  She reports that this episode was also associated with some sharp, nonradiating left-sided chest discomfort that was nonexertional, and has spontaneously resolved in the interval without any requirement for interval nitroglycerin.  She conveys that this episode of chest pain is not associate with any shortness of breath, palpitations, diaphoresis, nausea, vomiting, dizziness, presyncope, or syncope.  No recent wheezing or hemoptysis.  She has a reported history of prior ischemic stroke, with report of residual mild left-sided weakness.  Her medical history  is also notable for type 2 diabetes mellitus with most recent hemoglobin A1c of 7.3% in July 2022 as well as essential hypertension.  Currently on a daily baby aspirin at home.  She has a history of ascending thoracic aortic aneurysm.  Reports that today's episode of chest pain was not associate with any tearing sensation or any radiation of the discomfort to the back [place] between the shoulder blades.  In terms of prior coronary ischemic evaluation, she underwent left-sided heart cath in February 2015 for atypical chest pain, which reportedly showed no evidence of atherosclerotic disease at that time.  More recently, she underwent left heart cath and July 2022 at [facility].  Her most recent echocardiogram occurred in July 2022 was notable for LVEF 55 to 60%, no evidence of focal wall motion manage, will demonstrating evidence of grade 1 diastolic dysfunction, normal right ventricular systolic function, mild mitral regurgitation as well as trivial aortic regurgitation.    ED Course:  Vital signs in the ED were notable for the following: Afebrile; heart's in the 50s to 60s; systolic blood pressures in the 1 teens to 160s; respiratory rate 17-22, oxygen saturation 95 to 100% on room air.  Labs were notable for the following: CMP was notable for the following: Sodium 136, bicarbonate 19, anion gap 16, creatinine 1.22 compared to 1.23 on 05/14/2023, glucose 171, liver enzymes are within normal limits.  Lipase 43.  High sensitive troponin I is 4, with repeat value currently pending.  Serum ethanol level less than 10.  CBC notable for white blood cell count 8100, hemoglobin 14.1 and platelet count 228.  INR 0.9.  PTT less than 22.  Per my interpretation, EKG in ED demonstrated the following:  In comparison to most recent prior EKG from 05/14/2023, today's EKG shows sinus rhythm with heart rate 58, normal intervals, T wave inversion in leads V1 through V3, which appear unchanged from most recent prior EKG,  demonstrating no evidence of ST changes, Cleen evidence of ST elevation.  Imaging in the ED, per corresponding formal radiology read, was notable for the following: Noncontrast CT head showed no evidence of acute intracranial process, Cleen evidence of intracranial hemorrhage or any evidence of acute infarct.  CTA chest with dissection protocol showed a 4 cm ascending thoracic aortic aneurysm, without evidence of dissection, with associated radiology recommendation for annual imaging follow-up via either CTA or MRA.  Otherwise, CTA chest showed no evidence of acute cardiopulmonary process, including no evidence of acute pulmonary embolism or any evidence of infiltrate, pneumo, effusion, or pneumothorax.  EDP discussed patient's case with on-call neurology, Dr. Derry Lory, Who will formally consult.  Dr. Derry Lory recommended further evaluation with MRI brain.   Ensuing MRI brain shows no evidence of acute intracranial process, including no evidence of acute infarct.  While in the ED, the following were administered: Ativan 1 mg IV x 1 dose leading up to MRI brain.  Subsequently, the patient was admitted for overnight observation for further evaluation management and presenting acute onset left-sided numbness associated with dysarthria, as well as atypical chest pain, with presenting laboratory results notable for anion gap metabolic acidosis.     Review of Systems: As per HPI otherwise 10 point review of systems negative.   Past Medical History:  Diagnosis Date   Arthritis    Bronchitis    hx   Chest pain 2009   MV with no scar or ischemia, EF 65%   Complication of anesthesia    claustrophobic !   CVA (cerebral vascular accident) (HCC) 05/05/2012   Diabetes mellitus    GERD (gastroesophageal reflux disease)    History of cardiac catheterization    a. Myoview 2/15 with anteroapical ischemia, EF 64% (intermediate risk) >> LHC - normal cos, EF 55-60%   Hypertension    Hypokalemia  06/10/2021   Neuromuscular disorder (HCC)    arotic Aneurysm   Obesity (BMI 35.0-39.9 without comorbidity)    Pneumonia    Stroke [facility])    with some resid L sided weakness   Thoracic ascending aortic aneurysm (HCC)    a. CT 1/17: ascending aorta 4.2 x 4.0 cm    Past Surgical History:  Procedure Laterality Date   ABDOMINAL HYSTERECTOMY  2006   Partial, one ovary left    CARDIAC CATHETERIZATION  2004   [place] per pt.   CHOLECYSTECTOMY     EAR CYST EXCISION Left 09/20/2016   Procedure: EXCISION  LEFT EAR LESION;  Surgeon: Newman Pies, MD;  Location: [facility] OR;  Service: ENT;  Laterality: Left;   LEFT HEART CATH AND CORONARY ANGIOGRAPHY N/A 06/10/2021   Procedure: LEFT HEART CATH AND CORONARY ANGIOGRAPHY;  Surgeon: Tonny Bollman, MD;  Location: [facility] INVASIVE CV LAB;  Service: Cardiovascular;  Laterality: N/A;   LEFT HEART CATHETERIZATION WITH CORONARY ANGIOGRAM N/A 01/09/2014   Procedure: LEFT HEART CATHETERIZATION WITH CORONARY ANGIOGRAM;  Surgeon: Marykay Lex, MD;  Location: [facility] CATH LAB;  Service: Cardiovascular;  Laterality: N/A;   MENISCUS REPAIR Right    ROTATOR CUFF REPAIR Left    SKIN SPLIT GRAFT Left 09/20/2016   Procedure: SKIN GRAFT SPLIT THICKNESS;  Surgeon: Newman Pies, MD;  Location: [facility] OR;  Service: ENT;  Laterality: Left;   TEMPOROMANDIBULAR  JOINT SURGERY     TONSILLECTOMY     TYMPANOSTOMY TUBE PLACEMENT Left 06/03/2022   WISDOM TOOTH EXTRACTION      Social History:  reports that she has never smoked. She has never used smokeless tobacco. She reports that she does not drink alcohol and does not use drugs.   Allergies  Allergen Reactions   Lidocaine     "Messed with my nerves".   Adhesive [Tape] Rash    pls use elastic wraps instead of adhesive tape   Codeine Nausea And Vomiting   Empagliflozin Other (See Comments)    Headaches & lower back pain    Family History  Problem Relation Age of Onset   Heart disease Father    Valvular heart disease Father         aortic valve replacement   Pancreatitis Mother    Diabetes Maternal Grandmother    Heart disease Brother    Valvular heart disease Brother        aortic valve replacement   CAD Son        heart attack   Kidney disease Son     Family history reviewed and not pertinent    Prior to Admission medications   Medication Sig Start Date End Date Taking? Authorizing Provider  Acetaminophen Extra Strength 500 MG TABS Take 1 tablet by mouth 3 (three) times daily as needed. 06/29/23   [provider]  albuterol (PROVENTIL HFA;VENTOLIN HFA) 108 (90 BASE) MCG/ACT inhaler Inhale 1-2 puffs into the lungs every 6 (six) hours as needed for wheezing. 07/29/13   Reuben Likes, MD  aspirin EC 81 MG tablet Take 81 mg by mouth daily.    [provider]  atorvastatin (LIPITOR) 10 MG tablet Take 10 mg by mouth daily. 08/05/20   [provider]  benzonatate (TESSALON) 100 MG capsule Take 100 mg by mouth 3 (three) times daily as needed. 06/29/23   [provider]  Cholecalciferol (VITAMIN D3) 1000 units CAPS Take 1 capsule by mouth daily.    [provider]  Continuous Glucose Sensor (FREESTYLE LIBRE 2 SENSOR) MISC INJECT 1 SENSOR TO THE SKIN EVERY 14 DAYS FOR CONTINUOUS GLUCOSE MONITORING 02/05/23   [provider]  Continuous Glucose Sensor (FREESTYLE LIBRE 2 SENSOR) MISC Apply topically. 07/17/23   [provider]  Continuous Glucose Sensor (FREESTYLE LIBRE SENSOR SYSTEM) MISC Inject 1 sensor to the skin every 14 days for continuous glucose monitoring. 09/07/21   [provider]  fluticasone (FLONASE) 50 MCG/ACT nasal spray Place 2 sprays into both nostrils as needed for allergies. 04/09/22   [provider]  glimepiride (AMARYL) 2 MG tablet Take 2 mg by mouth daily. 03/17/22   [provider]  glucose blood (PRECISION QID TEST) test strip 1 each by Other route as needed. 07/07/21   [provider]  hydrochlorothiazide  (HYDRODIURIL) 12.5 MG tablet Take 12.5 mg by mouth daily. 09/07/23   [provider]  hydrochlorothiazide (MICROZIDE) 12.5 MG capsule Take 1 capsule (12.5 mg total) by mouth daily. 06/05/23   Sharlene Dory, PA-C  linagliptin (TRADJENTA) 5 MG TABS tablet Take 5 mg by mouth daily.    [provider]  lisinopril (ZESTRIL) 10 MG tablet Take 1 tablet by mouth daily. 07/16/23   [provider]  lisinopril (ZESTRIL) 40 MG tablet Take 1 tablet (40 mg total) by mouth daily. 05/10/23   Sharlene Dory, PA-C  metoprolol succinate (TOPROL-XL) 50 MG 24 hr tablet TAKE 1 TABLET  BY MOUTH DAILY WITH OR IMMEDIATELY FOLLOWING A MEAL Patient taking differently: Take 50 mg by mouth daily. 04/13/22   Swinyer, Zachary George, NP  omeprazole (PRILOSEC) 40 MG capsule Take 1 capsule by mouth daily. 08/09/22   [provider]  omeprazole (PRILOSEC) 40 MG capsule Take 40 mg by mouth daily. 08/03/23   [provider]  triamcinolone cream (KENALOG) 0.5 % Apply 1 Application topically 2 (two) times daily. 09/10/23   [provider]  triamcinolone ointment (KENALOG) 0.5 % Apply 1 Application topically daily as needed (for rash). 06/21/21   [provider]     Objective    Physical Exam: Vitals:   01/01/24 1730 01/01/24 1736 01/01/24 1737 01/01/24 1815  BP: (!) 163/91   138/88  Pulse: (!) 58   (!) 56  Resp: 17   (!) 22  Temp:   98.1 F (36.7 C)   TempSrc:   Oral   SpO2: 95%   98%  Height:  5\' 3"  (1.6 m)      General: appears to be stated age; alert, oriented Skin: warm, dry, no rash Head:  AT/ Mouth:  Oral mucosa membranes appear moist, normal dentition Neck: supple; trachea midline Heart:  RRR; did not appreciate any M/R/G Lungs: CTAB, did not appreciate any wheezes, rales, or rhonchi Abdomen: + BS; soft, ND, NT Vascular: 2+ pedal pulses b/l; 2+ radial pulses b/l Extremities: no peripheral edema, no muscle wasting Neuro: strength intact in upper and lower  extremities b/l; very mildly diminished sensation to light touch involving the left upper extremity; sensation intact in right upper extremity as well as the bilateral lower extremities.  No evidence of facial droop, tremors.     Labs on Admission: I have personally reviewed following labs and imaging studies  CBC: Recent Labs  Lab 01/01/24 1710 01/01/24 1713  WBC 8.1  --   NEUTROABS 3.5  --   HGB 14.1 14.3  HCT 43.9 42.0  MCV 91.8  --   PLT 228  --    Basic Metabolic Panel: Recent Labs  Lab 01/01/24 1710 01/01/24 1713  NA 136 137  K 4.2 4.2  CL 101 103  CO2 19*  --   GLUCOSE 171* 176*  BUN 22 24*  CREATININE 1.22* 1.30*  CALCIUM 9.3  --    GFR: CrCl cannot be calculated (Unknown ideal weight.). Liver Function Tests: Recent Labs  Lab 01/01/24 1710  AST 17  ALT 10  ALKPHOS 82  BILITOT 0.3  PROT 7.0  ALBUMIN 3.7   Recent Labs  Lab 01/01/24 1710  LIPASE 43   No results for input(s): "AMMONIA" in the last 168 hours. Coagulation Profile: Recent Labs  Lab 01/01/24 1710  INR 0.9   Cardiac Enzymes: No results for input(s): "CKTOTAL", "CKMB", "CKMBINDEX", "TROPONINI" in the last 168 hours. BNP (last 3 results) No results for input(s): "PROBNP" in the last 8760 hours. HbA1C: No results for input(s): "HGBA1C" in the last 72 hours. CBG: Recent Labs  Lab 01/01/24 1706  GLUCAP 165*   Lipid Profile: No results for input(s): "CHOL", "HDL", "LDLCALC", "TRIG", "CHOLHDL", "LDLDIRECT" in the last 72 hours. Thyroid Function Tests: No results for input(s): "TSH", "T4TOTAL", "FREET4", "T3FREE", "THYROIDAB" in the last 72 hours. Anemia Panel: No results for input(s): "VITAMINB12", "FOLATE", "FERRITIN", "TIBC", "IRON", "RETICCTPCT" in the last 72 hours. Urine analysis:    Component Value Date/Time   COLORURINE YELLOW 05/02/2020 1520   APPEARANCEUR CLEAR 05/02/2020 1520   LABSPEC 1.008 05/02/2020 1520  PHURINE 5.0 05/02/2020 1520   GLUCOSEU NEGATIVE 05/02/2020  1520   HGBUR NEGATIVE 05/02/2020 1520   BILIRUBINUR NEGATIVE 05/02/2020 1520   KETONESUR NEGATIVE 05/02/2020 1520   PROTEINUR NEGATIVE 05/02/2020 1520   UROBILINOGEN 1.0 03/07/2015 1720   NITRITE NEGATIVE 05/02/2020 1520   LEUKOCYTESUR NEGATIVE 05/02/2020 1520    Radiological Exams on Admission: CT Angio Chest Aorta W and/or Wo Contrast Result Date: 01/01/2024 CLINICAL DATA:  Acute aortic syndrome suspected, Stroke symptoms EXAM: CT ANGIOGRAPHY CHEST WITH CONTRAST TECHNIQUE: Multidetector CT imaging of the chest was performed using the standard protocol during bolus administration of intravenous contrast. Multiplanar CT image reconstructions and MIPs were obtained to evaluate the vascular anatomy. RADIATION DOSE REDUCTION: This exam was performed according to the departmental dose-optimization program which includes automated exposure control, adjustment of the mA and/or kV according to patient size and/or use of iterative reconstruction technique. CONTRAST:  75mL OMNIPAQUE IOHEXOL 350 MG/ML SOLN COMPARISON:  06/01/2023 FINDINGS: Cardiovascular: 4 cm ascending thoracic aortic aneurysm. No evidence of dissection. Atherosclerosis of the aortic arch. There is technically adequate opacification of the pulmonary vasculature. No filling defects or pulmonary emboli. The heart is enlarged, without pericardial effusion. Mediastinum/Nodes: No enlarged mediastinal, hilar, or axillary lymph nodes. Thyroid gland, trachea, and esophagus demonstrate no significant findings. Lungs/Pleura: No acute airspace disease, effusion, or pneumothorax. Hypoventilatory changes at the lung bases. Central airways are patent. Upper Abdomen: No acute abnormality. Musculoskeletal: No acute or destructive bony abnormalities. Reconstructed images demonstrate no additional findings. Review of the MIP images confirms the above findings. IMPRESSION: 1. 4 cm ascending thoracic aortic aneurysm. No evidence of dissection. Recommend annual  imaging followup by CTA or MRA. This recommendation follows 2010 ACCF/AHA/AATS/ACR/ASA/SCA/SCAI/SIR/STS/SVM Guidelines for the Diagnosis and Management of Patients with Thoracic Aortic Disease. Circulation. 2010; 121: Z610-R604. Aortic aneurysm NOS (ICD10-I71.9) 2. No evidence of pulmonary embolus. 3. Cardiomegaly. 4.  Aortic Atherosclerosis (ICD10-I70.0). Electronically Signed   By: Sharlet Salina M.D.   On: 01/01/2024 20:28   MR BRAIN WO CONTRAST Result Date: 01/01/2024 CLINICAL DATA:  Neuro deficit, acute, stroke suspected EXAM: MRI HEAD WITHOUT CONTRAST TECHNIQUE: Multiplanar, multiecho pulse sequences of the brain and surrounding structures were obtained without intravenous contrast. COMPARISON:  CT head February 11, 25. FINDINGS: Brain: No acute infarction, hemorrhage, hydrocephalus, extra-axial collection or mass lesion. Mild for age T2/FLAIR hyperintensities the white matter, nonspecific but compatible with chronic microvascular ischemic disease. Small remote infarct in the right anterior insula. Vascular: Major arterial flow voids are maintained at the skull base. Skull and upper cervical spine: Normal marrow signal. Sinuses/Orbits: Right maxillary sinus mucosal thickening and air-fluid level. No acute orbital findings. Other: No mastoid effusions. IMPRESSION: 1. No evidence of acute intracranial abnormality. 2. 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 3. Right maxillary sinus mucosal thickening and air-fluid level. Electronically Signed   By: Feliberto Harts M.D.   On: 01/01/2024 19:48   CT HEAD CODE STROKE WO CONTRAST Result Date: 01/01/2024 CLINICAL DATA:  Code stroke. 78 year old female. Left side deficit. EXAM: CT HEAD WITHOUT CONTRAST TECHNIQUE: Contiguous axial images were obtained from the base of the skull through the vertex without intravenous contrast. RADIATION DOSE REDUCTION: This exam was performed according to the departmental dose-optimization program which  includes automated exposure control, adjustment of the mA and/or kV according to patient size and/or use of iterative reconstruction technique. COMPARISON:  Brain MRI and Head CT 05/14/2023. FINDINGS: Brain: Cerebral volume appears stable and normal for age. No midline shift, mass effect, or evidence of  intracranial mass lesion. No ventriculomegaly. No acute intracranial hemorrhage identified. No cortically based acute infarct identified. Stable gray-white matter differentiation throughout the brain. Mild for age mostly left inferior frontal gyrus patchy white matter hypodensity. Vascular: No suspicious intracranial vascular hyperdensity. Skull: Stable and intact. Sinuses/Orbits: Chronic right maxillary sinusitis, otherwise well aerated. Other: Visualized orbits and scalp soft tissues are within normal limits. ASPECTS [facility] Stroke Program Early CT Score) Total score (0-10 with 10 being normal): 10 IMPRESSION: 1. No acute cortically based infarct or acute intracranial hemorrhage identified. ASPECTS 10. 2. Stable non contrast CT appearance of the brain since last year, mild for age white matter disease. 3. Chronic right maxillary sinusitis. 4. Salient findings were communicated to Dr. Derry Lory at 5:21 pm on 01/01/2024 by text page via the [facility] messaging system. Electronically Signed   By: Odessa Fleming M.D.   On: 01/01/2024 17:21      Assessment/Plan   Principal Problem:   Left sided numbness Active Problems:   DM2 (diabetes mellitus, type 2) (HCC)   Essential hypertension   GERD (gastroesophageal reflux disease)   Chronic diastolic CHF (congestive heart failure) (HCC)   Thoracic ascending aortic aneurysm (HCC)   Atypical chest pain   Dysarthria   High anion gap metabolic acidosis     #) Acute left-sided numbness/dysarthria: Patient presented with acute onset of left-sided numbness involving the left portion of face as well as left upper extremity as well as report of dysarthria, starting acutely  at 1530 on 01/01/2024, with interval spontaneous resolution of the dysarthria, and improvement in the left-sided numbness, with very mild residual numbness involving the left upper extremity.  Otherwise, no evidence of acute focal neurologic deficit.  Unclear etiology at this time, with differential including potential TIA, as CT head and MRI brain showed no evidence of acute intracranial process, Cleen evidence of intracranial strain evidence of acute infarct.  Neurology has been consulted, as above.  Will pursue additional evaluation of modifiable ischemic CVA risk factors, as further detailed below.  He is not a candidate for TNKase given very mild deficits that are spontaneously resolving.  This is in the context of a reported history of prior ischemic CVA with very mild residual left-sided weakness that was not appreciated on exam this evening.  Additional modifiable CVA risk factors include her history of type 2 diabetes mellitus, essential hypertension.  She was also noted to have mild much regurgitation on most recent echocardiogram in July 2022.  No known history of paroxysmal atrial fibrillation or any known history of obstructive sleep apnea, hyperlipidemia.  Is reported to be a lifelong non-smoker.  He is currently on a daily baby aspirin at home.  Will allow for permissive hypertension for 24 hours following onset of acute focal neurologic deficits, with associated parameters further quantified below, and with period of observance of permissive hypertension to end at  1530 on 2/12.    Plan: Nursing bedside swallow evaluation x 1 now, and will not initiate oral medications or diet until the patient has passed this. Head of the bed at 30 degrees. Neuro checks per protocol. VS per protocol. Will allow for permissive hypertension for 24 hours during which will hold home antihypertensive medications, with prn IV hydralazine ordered for SBP greater than 220 mmHg or DBP greater than 110 mmHg until  1530   on 2/12. Monitor on telemetry, including monitoring for atrial fibrillation as modifiable risk factor for acute ischemic CVA.  TTE without  bubble study has been ordered for the  morning, with additional indication for such in the setting of her presenting episode of chest pain. Additionally, as component of evaluation of potential modifiable ischemic CVA risk factors, will also check lipid panel and A1c. PT/OT/ST consults have been ordered for the morning.  Check urinary drug screen.  Neurology to consult.                    #) Atypical chest pain: Single episode of sharp, non-radiating, nonexertional left-sided chest discomfort that resolved spontaneously without any preceding nitroglycerin.  Currently chest pain-free.  Presentation appears less suggestive of ACS, including nonelevated initial troponin, with EKG showing no evidence of acute ischemic changes, including no evidence of ST elevation.  Will continue to trend troponin and pursue updated echocardiogram morning.  In terms of non cardiac sources of her chest wall, her history of ascending thoracic aortic aneurysm was noted.  However, CTA chest with dissection protocol was obtained, and showed unchanged 4 cm ascending thoracic aortic aneurysm without evidence of dissection.  The scan also showed no evidence of acute pulmonary embolism or any additional evidence of acute cardiopulmonary process, including no evidence of pneumothorax.  Her liver enzymes and lipase level were nonelevated.  Differential would also include her documented history of GERD.  Plan: Monitor on telemetry.  Continue to trend troponin.  Echocardiogram in the morning.  Add on serum magnesium level.  Repeat CMP, CBC in the morning.  Check urinary drug screen.  Prn EKG for additional chest pain.  Resume home PPI.                 #) Anion gap metabolic acidosis: Presenting CMP reflects mild anion gap metabolic acidosis with presenting bicarb of 19  and anion gap 16, with a minimally elevated glucose of 171.  Presentation appears less likely to represent euglycemic DKA.  Will check blood gas to further assess.  No overt evidence of underlying infectious process at this time, including CTA chest which showed no evidence of acute process, including no evidence of infiltrate.  Will also check urinalysis to further evaluate.  Differential includes potential contribution from transient ischemia in the setting of concern for presenting TIA after presenting with transient left-sided numbness and dysarthria.  No clinical evidence to suggest underlying seizure, and she has appeared hemodynamically stable throughout her ED course.   Plan: Check urinalysis, urinary drug screen.  Check blood gas, as above.  Add on salicylate level.  Repeat CMP, CBC in the morning.  Further evaluation management of presenting acute left-sided numbness/dysarthria, as above.                   #) Type 2 Diabetes Mellitus: documented history of such. Home insulin regimen: None. Home oral hypoglycemic agents: Tradjenta, glimepiride. presenting blood sugar: 171. Most recent A1c noted to be 7.3 in July 2022. Will check blood gas given the presence of very mild anion gap metabolic acidosis. DKA is felt to be less likely at this time, pending evaluation of blood gas.  Plan: accuchecks QAC and HS with low dose SSI.  Add on hemoglobin A1c level.  Check VBG, as above. hold home oral hypoglycemic agents during this hospitalization.                     #) GERD: documented h/o such; on omeprazole as outpatient.  Notable history given the patient's presenting atypical chest discomfort.  Plan: continue home PPI.                   #)  Essential Hypertension: documented h/o such, with outpatient antihypertensive regimen including HCTZ, lisinopril, metoprolol succinate.  SBP's in the ED today: 1 teens to 160s mmHg.   Plan: Close monitoring of  subsequent BP via routine VS. will observed permissive hypertension in the setting of potential presenting TIA, as further detailed above.  During this time, will hold home hypertensive medications, and pursue as needed IV hydralazine for systolic pressure greater than 220 or diastolic blood pressure greater than 110 mmHg. monitor on telemetry.                  #) Ascending thoracic aortic aneurysm: Documented history of such, with this evening CTA chest with dissection protocol showing a reported unchanged 4 cm ascending thoracic aortic aneurysm without evidence of dissection, with corresponding radiology recommendation for annual imaging follow-up via either CTA or MRA.  Plan: Will convey radiology's recommendation for annual imaging follow-up via either CTA or MRA.                #) Chronic diastolic heart failure: documented history of such, with most recent echocardiogram performed in July 2022, which was notable for LVEF 55 to 60%, grade 1 diastolic dysfunction, with additional results as conveyed above. No clinical or radiographic evidence to suggest acutely decompensated heart failure at this time, including CTA chest which showed no evidence of interstitial or pulmonary edema and or any evidence of infiltrate or pleural effusion. home diuretic regimen reportedly consists of the following: HCTZ, in the absence of any use of loop diuretics.   Plan: monitor strict I's & O's and daily weights. Repeat CMP in AM. Check serum mag level.  Will hold next dose of HCTZ and observance of permissive hypertension, as above.  Add on BNP.  Follow-up for result of echocardiogram, which has been ordered as a component of evaluation for presenting chest pain/acute onset left-sided numbness/dysarthria, as further detailed above.     DVT prophylaxis: SCD's   Code Status: Full code Family Communication: none Disposition Plan: Per Rounding Team Consults called: EDP d/w on-call  neurology, Dr. Derry Lory, as further detailed above;  Admission status: Observation     I SPENT GREATER THAN 75  MINUTES IN CLINICAL CARE TIME/MEDICAL DECISION-MAKING IN COMPLETING THIS ADMISSION.      Chaney Born Marja Adderley DO [facility]  From 7PM - 7AM   01/01/2024, 9:25 PM

## 2024-01-02 ENCOUNTER — Observation Stay (HOSPITAL_COMMUNITY): Payer: Medicare Other

## 2024-01-02 DIAGNOSIS — N1831 Chronic kidney disease, stage 3a: Secondary | ICD-10-CM | POA: Diagnosis present

## 2024-01-02 DIAGNOSIS — R200 Anesthesia of skin: Secondary | ICD-10-CM | POA: Diagnosis not present

## 2024-01-02 DIAGNOSIS — G459 Transient cerebral ischemic attack, unspecified: Secondary | ICD-10-CM

## 2024-01-02 LAB — URINALYSIS, COMPLETE (UACMP) WITH MICROSCOPIC
Bacteria, UA: NONE SEEN
Bilirubin Urine: NEGATIVE
Glucose, UA: NEGATIVE mg/dL
Hgb urine dipstick: NEGATIVE
Ketones, ur: NEGATIVE mg/dL
Leukocytes,Ua: NEGATIVE
Nitrite: NEGATIVE
Protein, ur: NEGATIVE mg/dL
Specific Gravity, Urine: >1.046 — ABNORMAL HIGH (ref 1.005–1.030)
pH: 5 (ref 5.0–8.0)

## 2024-01-02 LAB — CBC WITH DIFFERENTIAL/PLATELET
Abs Immature Granulocytes: 0.01 10*3/uL (ref 0.00–0.07)
Basophils Absolute: 0 10*3/uL (ref 0.0–0.1)
Basophils Relative: 1 %
Eosinophils Absolute: 0.3 10*3/uL (ref 0.0–0.5)
Eosinophils Relative: 4 %
HCT: 39.2 % (ref 36.0–46.0)
Hemoglobin: 12.8 g/dL (ref 12.0–15.0)
Immature Granulocytes: 0 %
Lymphocytes Relative: 38 %
Lymphs Abs: 2.5 10*3/uL (ref 0.7–4.0)
MCH: 29.8 pg (ref 26.0–34.0)
MCHC: 32.7 g/dL (ref 30.0–36.0)
MCV: 91.2 fL (ref 80.0–100.0)
Monocytes Absolute: 0.3 10*3/uL (ref 0.1–1.0)
Monocytes Relative: 5 %
Neutro Abs: 3.4 10*3/uL (ref 1.7–7.7)
Neutrophils Relative %: 52 %
Platelets: 243 10*3/uL (ref 150–400)
RBC: 4.3 MIL/uL (ref 3.87–5.11)
RDW: 13.7 % (ref 11.5–15.5)
WBC: 6.5 10*3/uL (ref 4.0–10.5)
nRBC: 0 % (ref 0.0–0.2)

## 2024-01-02 LAB — ECHOCARDIOGRAM COMPLETE
Area-P 1/2: 2.29 cm2
Calc EF: 54.1 %
Height: 63 in
S' Lateral: 3.4 cm
Single Plane A2C EF: 62.4 %
Single Plane A4C EF: 43.3 %

## 2024-01-02 LAB — COMPREHENSIVE METABOLIC PANEL
ALT: 10 U/L (ref 0–44)
AST: 13 U/L — ABNORMAL LOW (ref 15–41)
Albumin: 3.1 g/dL — ABNORMAL LOW (ref 3.5–5.0)
Alkaline Phosphatase: 73 U/L (ref 38–126)
Anion gap: 9 (ref 5–15)
BUN: 18 mg/dL (ref 8–23)
CO2: 23 mmol/L (ref 22–32)
Calcium: 8.7 mg/dL — ABNORMAL LOW (ref 8.9–10.3)
Chloride: 106 mmol/L (ref 98–111)
Creatinine, Ser: 1.16 mg/dL — ABNORMAL HIGH (ref 0.44–1.00)
GFR, Estimated: 48 mL/min — ABNORMAL LOW (ref >60)
Glucose, Bld: 154 mg/dL — ABNORMAL HIGH (ref 70–99)
Potassium: 3.6 mmol/L (ref 3.5–5.1)
Sodium: 138 mmol/L (ref 135–145)
Total Bilirubin: 0.7 mg/dL (ref 0.0–1.2)
Total Protein: 6.1 g/dL — ABNORMAL LOW (ref 6.5–8.1)

## 2024-01-02 LAB — BLOOD GAS, VENOUS
Acid-Base Excess: 2.2 mmol/L — ABNORMAL HIGH (ref 0.0–2.0)
Bicarbonate: 27.3 mmol/L (ref 20.0–28.0)
O2 Saturation: 87.1 %
Patient temperature: 36.6
pCO2, Ven: 42 mm[Hg] — ABNORMAL LOW (ref 44–60)
pH, Ven: 7.42 (ref 7.25–7.43)
pO2, Ven: 52 mm[Hg] — ABNORMAL HIGH (ref 32–45)

## 2024-01-02 LAB — RAPID URINE DRUG SCREEN, HOSP PERFORMED
Amphetamines: NOT DETECTED
Barbiturates: NOT DETECTED
Benzodiazepines: NOT DETECTED
Cocaine: NOT DETECTED
Opiates: NOT DETECTED
Tetrahydrocannabinol: NOT DETECTED

## 2024-01-02 LAB — MAGNESIUM: Magnesium: 1.7 mg/dL (ref 1.7–2.4)

## 2024-01-02 LAB — GLUCOSE, CAPILLARY
Glucose-Capillary: 134 mg/dL — ABNORMAL HIGH (ref 70–99)
Glucose-Capillary: 186 mg/dL — ABNORMAL HIGH (ref 70–99)

## 2024-01-02 LAB — LIPID PANEL
Cholesterol: 115 mg/dL (ref 0–200)
HDL: 39 mg/dL — ABNORMAL LOW (ref >40)
LDL Cholesterol: 61 mg/dL (ref 0–99)
Total CHOL/HDL Ratio: 2.9 {ratio}
Triglycerides: 76 mg/dL (ref <150)
VLDL: 15 mg/dL (ref 0–40)

## 2024-01-02 LAB — BRAIN NATRIURETIC PEPTIDE: B Natriuretic Peptide: 38.2 pg/mL (ref 0.0–100.0)

## 2024-01-02 LAB — SALICYLATE LEVEL: Salicylate Lvl: <7 mg/dL — ABNORMAL LOW (ref 7.0–30.0)

## 2024-01-02 LAB — TROPONIN I (HIGH SENSITIVITY): Troponin I (High Sensitivity): 4 ng/L (ref <18)

## 2024-01-02 NOTE — Progress Notes (Signed)
OT Cancellation Note  Patient Details Name: [name] [name] MRN: [id number] DOB: [birth year]   Cancelled Treatment:    Reason Eval/Treat Not Completed: Other (comment);Patient at procedure or test/ unavailable (Echo with pt. Will attempt later.)  [facility] 01/02/2024, 9:38 AM Luisa Dago, OT/L   Acute OT Clinical Specialist Acute Rehabilitation Services Pager [phone] Office [phone]

## 2024-01-02 NOTE — Progress Notes (Signed)
PT Cancellation Note  Patient Details Name: [name] [name] MRN: [id number] DOB: June 01, 1946   Cancelled Treatment:    Reason Eval/Treat Not Completed: (P) OT screened, no needs identified, will sign off OT reports pt is at her baseline level of function and is appropriate to return home.   Rajat Staver B. Beverely Risen PT, DPT Acute Rehabilitation Services Please use secure chat or  Call Office [phone]    Elon Alas [facility] 01/02/2024, 11:18 AM

## 2024-01-02 NOTE — Discharge Summary (Signed)
Physician Discharge Summary   Patient: [name] [name] MRN: [id number] DOB: 1946-09-09  Admit date:     01/01/2024  Discharge date: 01/02/24  Discharge Physician: Jonah Blue   PCP: Corliss Blacker, MD   Recommendations at discharge:   Follow up with Dr. Lourena Simmonds in 1-2 weeks; will need to discuss echocardiogram results as well as do hospital f/u Continue aspirin 81 mg daily  Discharge Diagnoses: Principal Problem:   Left sided numbness Active Problems:   DM2 (diabetes mellitus, type 2) (HCC)   Essential hypertension   Chronic diastolic CHF (congestive heart failure) (HCC)   Thoracic ascending aortic aneurysm (HCC)   Atypical chest pain   Pure hypercholesterolemia   Chronic kidney disease, stage 3a [facility])   Hospital Course: 78yo with h/o DM, HTN, CVA with mild residual L-sided weakness, ascending thoracic aortic aneurysm, and HFpEF who presented on 2/11 with acutely worsened left-sided numbness and left-sided CP.  Neurology consulted, negative MRI.  Chest pain evaluation also unremarkable.  Assessment and Plan:  Left-sided numbness, acute on chronic Patient with h/o CVA with residual L-sided numbness Developed acute worsening of L-sided weakness PTA Concerning for recurrent CVA Placed in observation status for CVA/TIA evaluation CT, MRI, CTA all negative Echo is pending, will need outpatient f/u Neurology consulted PT/OT Consulted, no needs Symptoms have resolved Continue ASA 81 mg daily  Atypical chest pain Also with concurrent chest discomfort Resolved Negative BNP, troponin Low suspicion for ACS  HTN Continue hydrochlorothiazide, lisinopril, Toprol XL   HLD LDL is at goal of <70 Continue Lipitor 10 mg daily   DM A1c is 8.2, poor control Continue Amaryl, Tradjenta Consider additional medications and/or endocrinology evaluation  Ascending thoracic aortic aneurysm 4 cm on imaging Need outpatient f/u with annual imaging by CTA or  MRA  HFpEF  Echo in 05/2021 with preserved EF and grade 1 diastolic dysfunction Appears compensated at this time  Stage 3a CKD Approaching 3b Appears to be stable at this time Avoid nephrotoxic agent     Consultants: Neurology PT OT  Procedures: Echocardiogram 2/12  Antibiotics: None      Pain control - [facility] Controlled Substance Reporting System database was reviewed. and patient was instructed, not to drive, operate heavy machinery, perform activities at heights, swimming or participation in water activities or provide baby-sitting services while on Pain, Sleep and Anxiety Medications; until their outpatient Physician has advised to do so again. Also recommended to not to take more than prescribed Pain, Sleep and Anxiety Medications.    Disposition: Home Diet recommendation:  Cardiac and Carb modified diet DISCHARGE MEDICATION: Allergies as of 01/02/2024       Reactions   Lidocaine    "Messed with my nerves".   Adhesive [tape] Rash   pls use elastic wraps instead of adhesive tape   Codeine Nausea And Vomiting   Empagliflozin Other (See Comments)   Headaches & lower back pain        Medication List     STOP taking these medications    benzonatate 100 MG capsule Commonly known as: TESSALON       TAKE these medications    Acetaminophen Extra Strength 500 MG Tabs Take 1 tablet by mouth 3 (three) times daily as needed.   albuterol 108 (90 Base) MCG/ACT inhaler Commonly known as: VENTOLIN HFA Inhale 1-2 puffs into the lungs every 6 (six) hours as needed for wheezing.   aspirin EC 81 MG tablet Take 81 mg by mouth daily.   atorvastatin  10 MG tablet Commonly known as: LIPITOR Take 10 mg by mouth daily.   fluticasone 50 MCG/ACT nasal spray Commonly known as: FLONASE Place 2 sprays into both nostrils as needed for allergies.   FreeStyle Libre 2 Sensor Misc INJECT 1 SENSOR TO THE SKIN EVERY 14 DAYS FOR CONTINUOUS GLUCOSE MONITORING What  changed: Another medication with the same name was removed. Continue taking this medication, and follow the directions you see here.   glimepiride 2 MG tablet Commonly known as: AMARYL Take 2 mg by mouth daily.   hydrochlorothiazide 12.5 MG capsule Commonly known as: MICROZIDE Take 1 capsule (12.5 mg total) by mouth daily. What changed: Another medication with the same name was removed. Continue taking this medication, and follow the directions you see here.   linagliptin 5 MG Tabs tablet Commonly known as: TRADJENTA Take 5 mg by mouth daily.   lisinopril 40 MG tablet Commonly known as: ZESTRIL Take 1 tablet (40 mg total) by mouth daily. What changed: Another medication with the same name was removed. Continue taking this medication, and follow the directions you see here.   metoprolol succinate 50 MG 24 hr tablet Commonly known as: TOPROL-XL TAKE 1 TABLET BY MOUTH DAILY WITH OR IMMEDIATELY FOLLOWING A MEAL What changed:  how much to take how to take this when to take this additional instructions   omeprazole 40 MG capsule Commonly known as: PRILOSEC Take 40 mg by mouth daily. What changed: Another medication with the same name was removed. Continue taking this medication, and follow the directions you see here.   Precision QID Test test strip Generic drug: glucose blood 1 each by Other route as needed.   triamcinolone cream 0.5 % Commonly known as: KENALOG Apply 1 Application topically 2 (two) times daily. What changed: Another medication with the same name was removed. Continue taking this medication, and follow the directions you see here.   Vitamin D3 25 MCG (1000 UT) Caps Take 1 capsule by mouth daily.        Discharge Exam:   Subjective: Feeling better, symptoms have resolved, no new concerns, feels ok with going home today.   Objective: Vitals:   01/02/24 0318 01/02/24 0835  BP: (!) 109/51 (!) 116/54  Pulse: (!) 48 (!) 52  Resp: 18 18  Temp: 97.8  F (36.6 C) 97.8 F (36.6 C)  SpO2: 98% 99%    Intake/Output Summary (Last 24 hours) at 01/02/2024 1133 Last data filed at 01/02/2024 0730 Gross per 24 hour  Intake 490 ml  Output --  Net 490 ml   There were no vitals filed for this visit.  Exam:  General:  Appears calm and comfortable and is in NAD Eyes:  EOMI, normal lids, iris ENT:  grossly normal hearing, lips & tongue, mmm Neck:  no LAD, masses or thyromegaly Cardiovascular:  RRR, no m/r/g. No LE edema.  Respiratory:   CTA bilaterally with no wheezes/rales/rhonchi.  Normal respiratory effort. Abdomen:  soft, NT, ND Skin:  no rash or induration seen on limited exam Musculoskeletal:  grossly normal tone BUE/BLE, good ROM, no bony abnormality Psychiatric:  blunted mood and affect, speech fluent and appropriate, AOx3 Neurologic:  CN 2-12 grossly intact, moves all extremities in coordinated fashion  Data Reviewed: I have reviewed the patient's lab results since admission.  Pertinent labs for today include:   Glucose 154 CO2 23 BUN 18/Creatinine 1.16/GFR 48 Albumin 3.1 BNP 38.2 HS troponin 6, 4 Lipids: 115/39/61/76 Normal CBC UDS negative A1c 8.2  Condition at discharge: good  The results of significant diagnostics from this hospitalization (including imaging, microbiology, ancillary and laboratory) are listed below for reference.   Imaging Studies: CT Angio Chest Aorta W and/or Wo Contrast Result Date: 01/01/2024 CLINICAL DATA:  Acute aortic syndrome suspected, Stroke symptoms EXAM: CT ANGIOGRAPHY CHEST WITH CONTRAST TECHNIQUE: Multidetector CT imaging of the chest was performed using the standard protocol during bolus administration of intravenous contrast. Multiplanar CT image reconstructions and MIPs were obtained to evaluate the vascular anatomy. RADIATION DOSE REDUCTION: This exam was performed according to the departmental dose-optimization program which includes automated exposure control, adjustment of the  mA and/or kV according to patient size and/or use of iterative reconstruction technique. CONTRAST:  75mL OMNIPAQUE IOHEXOL 350 MG/ML SOLN COMPARISON:  06/01/2023 FINDINGS: Cardiovascular: 4 cm ascending thoracic aortic aneurysm. No evidence of dissection. Atherosclerosis of the aortic arch. There is technically adequate opacification of the pulmonary vasculature. No filling defects or pulmonary emboli. The heart is enlarged, without pericardial effusion. Mediastinum/Nodes: No enlarged mediastinal, hilar, or axillary lymph nodes. Thyroid gland, trachea, and esophagus demonstrate no significant findings. Lungs/Pleura: No acute airspace disease, effusion, or pneumothorax. Hypoventilatory changes at the lung bases. Central airways are patent. Upper Abdomen: No acute abnormality. Musculoskeletal: No acute or destructive bony abnormalities. Reconstructed images demonstrate no additional findings. Review of the MIP images confirms the above findings. IMPRESSION: 1. 4 cm ascending thoracic aortic aneurysm. No evidence of dissection. Recommend annual imaging followup by CTA or MRA. This recommendation follows 2010 ACCF/AHA/AATS/ACR/ASA/SCA/SCAI/SIR/STS/SVM Guidelines for the Diagnosis and Management of Patients with Thoracic Aortic Disease. Circulation. 2010; 121: Z610-R604. Aortic aneurysm NOS (ICD10-I71.9) 2. No evidence of pulmonary embolus. 3. Cardiomegaly. 4.  Aortic Atherosclerosis (ICD10-I70.0). Electronically Signed   By: Sharlet Salina M.D.   On: 01/01/2024 20:28   MR BRAIN WO CONTRAST Result Date: 01/01/2024 CLINICAL DATA:  Neuro deficit, acute, stroke suspected EXAM: MRI HEAD WITHOUT CONTRAST TECHNIQUE: Multiplanar, multiecho pulse sequences of the brain and surrounding structures were obtained without intravenous contrast. COMPARISON:  CT head February 11, 25. FINDINGS: Brain: No acute infarction, hemorrhage, hydrocephalus, extra-axial collection or mass lesion. Mild for age T2/FLAIR hyperintensities the white  matter, nonspecific but compatible with chronic microvascular ischemic disease. Small remote infarct in the right anterior insula. Vascular: Major arterial flow voids are maintained at the skull base. Skull and upper cervical spine: Normal marrow signal. Sinuses/Orbits: Right maxillary sinus mucosal thickening and air-fluid level. No acute orbital findings. Other: No mastoid effusions. IMPRESSION: 1. No evidence of acute intracranial abnormality. 2. 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 3. Right maxillary sinus mucosal thickening and air-fluid level. Electronically Signed   By: Feliberto Harts M.D.   On: 01/01/2024 19:48   CT HEAD CODE STROKE WO CONTRAST Result Date: 01/01/2024 CLINICAL DATA:  Code stroke. 78 year old female. Left side deficit. EXAM: CT HEAD WITHOUT CONTRAST TECHNIQUE: Contiguous axial images were obtained from the base of the skull through the vertex without intravenous contrast. RADIATION DOSE REDUCTION: This exam was performed according to the departmental dose-optimization program which includes automated exposure control, adjustment of the mA and/or kV according to patient size and/or use of iterative reconstruction technique. COMPARISON:  Brain MRI and Head CT 05/14/2023. FINDINGS: Brain: Cerebral volume appears stable and normal for age. No midline shift, mass effect, or evidence of intracranial mass lesion. No ventriculomegaly. No acute intracranial hemorrhage identified. No cortically based acute infarct identified. Stable gray-white matter differentiation throughout the brain. Mild for age mostly left inferior frontal gyrus patchy  white matter hypodensity. Vascular: No suspicious intracranial vascular hyperdensity. Skull: Stable and intact. Sinuses/Orbits: Chronic right maxillary sinusitis, otherwise well aerated. Other: Visualized orbits and scalp soft tissues are within normal limits. ASPECTS [facility] Stroke Program Early CT Score) Total score (0-10 with 10  being normal): 10 IMPRESSION: 1. No acute cortically based infarct or acute intracranial hemorrhage identified. ASPECTS 10. 2. Stable non contrast CT appearance of the brain since last year, mild for age white matter disease. 3. Chronic right maxillary sinusitis. 4. Salient findings were communicated to Dr. Derry Lory at 5:21 pm on 01/01/2024 by text page via the [facility] messaging system. Electronically Signed   By: Odessa Fleming M.D.   On: 01/01/2024 17:21    Microbiology: Results for orders placed or performed during the hospital encounter of 06/09/21  SARS CORONAVIRUS 2 (TAT 6-24 HRS) Nasopharyngeal Nasopharyngeal Swab     Status: None   Collection Time: 06/10/21  2:55 AM   Specimen: Nasopharyngeal Swab  Result Value Ref Range Status   SARS Coronavirus 2 NEGATIVE NEGATIVE Final    Comment: (NOTE) SARS-CoV-2 target nucleic acids are NOT DETECTED.  The SARS-CoV-2 RNA is generally detectable in upper and lower respiratory specimens during the acute phase of infection. Negative results do not preclude SARS-CoV-2 infection, do not rule out co-infections with other pathogens, and should not be used as the sole basis for treatment or other patient management decisions. Negative results must be combined with clinical observations, patient history, and epidemiological information. The expected result is Negative.  Fact Sheet for Patients: HairSlick.no  Fact Sheet for Healthcare Providers: quierodirigir.com  This test is not yet approved or cleared by the [place] FDA and  has been authorized for detection and/or diagnosis of SARS-CoV-2 by FDA under an Emergency Use Authorization (EUA). This EUA will remain  in effect (meaning this test can be used) for the duration of the COVID-19 declaration under Se ction 564(b)(1) of the Act, 21 U.S.C. section 360bbb-3(b)(1), unless the authorization is terminated or revoked sooner.  Performed at [facility], [address], [place], [place] [address]     Labs: CBC: Recent Labs  Lab 01/01/24 1710 01/01/24 1713 01/02/24 0543  WBC 8.1  --  6.5  NEUTROABS 3.5  --  3.4  HGB 14.1 14.3 12.8  HCT 43.9 42.0 39.2  MCV 91.8  --  91.2  PLT 228  --  243   Basic Metabolic Panel: Recent Labs  Lab 01/01/24 1710 01/01/24 1713 01/01/24 2122 01/02/24 0543  NA 136 137  --  138  K 4.2 4.2  --  3.6  CL 101 103  --  106  CO2 19*  --   --  23  GLUCOSE 171* 176*  --  154*  BUN 22 24*  --  18  CREATININE 1.22* 1.30*  --  1.16*  CALCIUM 9.3  --   --  8.7*  MG  --   --  1.7 1.7   Liver Function Tests: Recent Labs  Lab 01/01/24 1710 01/02/24 0543  AST 17 13*  ALT 10 10  ALKPHOS 82 73  BILITOT 0.3 0.7  PROT 7.0 6.1*  ALBUMIN 3.7 3.1*   CBG: Recent Labs  Lab 01/01/24 1706 01/01/24 2215 01/02/24 0609  GLUCAP 165* 136* 134*    Discharge time spent: greater than 30 minutes.  Signed: Jonah Blue, MD [facility] 01/02/2024

## 2024-01-02 NOTE — Hospital Course (Signed)
77yo with h/o DM, HTN, CVA with mild residual L-sided weakness, ascending thoracic aortic aneurysm, and HFpEF who presented on 2/11 with acutely worsened left-sided numbness and left-sided CP.  Neurology consulted, negative MRI.  Chest pain evaluation also unremarkable.

## 2024-01-02 NOTE — Progress Notes (Signed)
Brief Neuro Update:  Reviewed MRI Brain which is negative. Low overall suspicion for stroke based on her clinical presentation with slight left lower face and left hand numbness and chest pain all at the same time and intermittent. Speech was slurred due to missing some teeth.  At this time, we will signoff. Please feel free to contact us with any questions or concerns.  Erick Blinks [facility]

## 2024-01-02 NOTE — Evaluation (Signed)
Occupational Therapy Evaluation and DC Summary  Patient Details Name: [name] [name] MRN: [id number] DOB: 04-09-1946 Today's Date: 01/02/2024   History of Present Illness   Pt is a 78 yo female presenting to [facility] on 2/11 for acute onset of L sided weakness. MRI negative for any acute findings. 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     Clinical Impressions Pt admitted for above, LUE remains weaker than R, provided pt with squeezeball to promote increased grip strength. Pt reports she does exercises for her LUE at baseline due to prior shoulder deficits. She is ambulatory with Supervision + SPC and was educated on BEFAST (pt verbalized understanding). Pt has no further acute skilled OT needs. No post acute OT recommended.      If plan is discharge home, recommend the following:   Assistance with cooking/housework     Functional Status Assessment   Patient has not had a recent decline in their functional status     Equipment Recommendations   None recommended by OT     Recommendations for Other Services         Precautions/Restrictions   Precautions Precautions: Fall (low fall risk) Restrictions Weight Bearing Restrictions Per Provider Order: No     Mobility Bed Mobility Overal bed mobility: Needs Assistance Bed Mobility: Supine to Sit     Supine to sit: Supervision, HOB elevated          Transfers Overall transfer level: Needs assistance Equipment used: 1 person hand held assist Transfers: Sit to/from Stand Sit to Stand: Contact guard assist           General transfer comment: CGA HHA for safety      Balance Overall balance assessment: Needs assistance Sitting-balance support: Feet supported, No upper extremity supported Sitting balance-Leahy Scale: Good Sitting balance - Comments: EOB   Standing balance support: Single extremity supported, During functional  activity Standing balance-Leahy Scale: Fair                             ADL either performed or assessed with clinical judgement   ADL Overall ADL's : Needs assistance/impaired Eating/Feeding: Independent;Sitting   Grooming: Standing;Supervision/safety   Upper Body Bathing: Standing;Supervision/ safety   Lower Body Bathing: Sitting/lateral leans;Set up   Upper Body Dressing : Sitting;Modified independent   Lower Body Dressing: Sitting/lateral leans;Set up   Toilet Transfer: Ambulation;Supervision/safety Toilet Transfer Details (indicate cue type and reason): + SPC Toileting- Clothing Manipulation and Hygiene: Sit to/from stand;Supervision/safety       Functional mobility during ADLs: Supervision/safety;Cane General ADL Comments: educated pt on BEFAST and reinforced stress management     Vision Baseline Vision/History: 0 No visual deficits Patient Visual Report: No change from baseline       Perception         Praxis         Pertinent Vitals/Pain Pain Assessment Pain Assessment: Faces Faces Pain Scale: Hurts a little bit Pain Location: chest Pain Descriptors / Indicators: Aching Pain Intervention(s): Monitored during session, Repositioned     Extremity/Trunk Assessment Upper Extremity Assessment Upper Extremity Assessment: Generalized weakness;Right hand dominant;LUE deficits/detail (R>L. RUE 4/5 but LUE 3/5) LUE Deficits / Details: ROM WFL, pt reports hx of shoulder injury affecting coordination since then LUE Sensation: decreased light touch           Communication Communication Communication: No apparent difficulties   Cognition  Arousal: Alert Behavior During Therapy: WFL for tasks assessed/performed Cognition: No apparent impairments                               Following commands: Intact       Cueing  General Comments          Exercises     Shoulder Instructions      Home Living Family/patient expects to  be discharged to:: Private residence Living Arrangements: Alone Available Help at Discharge: Family;Available PRN/intermittently (kids live actross and down the street) Type of Home: House Home Access: Level entry     Home Layout: One level     Bathroom Shower/Tub: Chief Strategy Officer:  (Low per pt report)     Home Equipment: Grab bars - tub/shower;Tub bench;Cane - single point   Additional Comments: Son and daughter both live within walking distance to pts house.      Prior Functioning/Environment Prior Level of Function : Independent/Modified Independent             Mobility Comments: Ind with SPC. ADLs Comments: Ind, daughter gathers groceries    OT Problem List: Decreased strength   OT Treatment/Interventions:        OT Goals(Current goals can be found in the care plan section)   Acute Rehab OT Goals Patient Stated Goal: TO go home OT Goal Formulation: All assessment and education complete, DC therapy Time For Goal Achievement: 01/16/24 Potential to Achieve Goals: Good   OT Frequency:       Co-evaluation              AM-PAC OT "6 Clicks" Daily Activity     Outcome Measure Help from another person eating meals?: None Help from another person taking care of personal grooming?: A Little Help from another person toileting, which includes using toliet, bedpan, or urinal?: A Little Help from another person bathing (including washing, rinsing, drying)?: A Little Help from another person to put on and taking off regular upper body clothing?: None Help from another person to put on and taking off regular lower body clothing?: None 6 Click Score: 21   End of Session Equipment Utilized During Treatment: Gait belt;Other (comment) [place] [facility]) Nurse Communication: Mobility status  Activity Tolerance: Patient tolerated treatment well Patient left: in chair;with call bell/phone within reach;with chair alarm set;with family/visitor present  OT Visit  Diagnosis: Muscle weakness (generalized) (M62.81)                Time: 1610-9604 OT Time Calculation (min): 24 min Charges:  OT General Charges $OT Visit: 1 Visit OT Evaluation $OT Eval Low Complexity: 1 Low OT Treatments $Therapeutic Activity: 8-22 mins  01/02/2024  AB, OTR/L  Acute Rehabilitation Services  Office: [phone]   Tristan Schroeder 01/02/2024, 12:35 PM

## 2024-01-02 NOTE — Progress Notes (Signed)
Pt discharged at this time.  All questions answered.  Has all discharge paperwork with her and has all belongings.  Family member at bedside to transport patient home.

## 2024-01-02 NOTE — TOC Transition Note (Signed)
Transition of Care [facility]) - Discharge Note   Patient Details  Name: [name] [name] MRN: [id number] Date of Birth: [birth year]  [facility]) CM/SW Contact:  Kermit Balo, RN Phone Number: 01/02/2024, 11:45 AM   Clinical Narrative:     Pt is discharging home with self care. No needs per TOC.   Final next level of care: Home/Self Care Barriers to Discharge: No Barriers Identified   Patient Goals and CMS Choice            Discharge Placement                       Discharge Plan and Services Additional resources added to the After Visit Summary for                                       Social Drivers of Health (SDOH) Interventions SDOH Screenings   Depression (PHQ2-9): Low Risk  (07/15/2020)  Stress: Stress Concern Present (01/23/2023)  Tobacco Use: Low Risk  (01/01/2024)     Readmission Risk Interventions     No data to display

## 2024-01-02 NOTE — Progress Notes (Signed)
  Echocardiogram 2D Echocardiogram has been performed.  [name] [name] 01/02/2024, 9:57 AM

## 2024-02-06 ENCOUNTER — Encounter: Payer: Self-pay | Admitting: Physician Assistant

## 2024-02-06 ENCOUNTER — Ambulatory Visit: Payer: Medicare Other | Attending: Physician Assistant | Admitting: Physician Assistant

## 2024-02-06 VITALS — BP 114/62 | HR 74 | Ht 63.0 in | Wt 179.8 lb

## 2024-02-06 DIAGNOSIS — I10 Essential (primary) hypertension: Secondary | ICD-10-CM

## 2024-02-06 DIAGNOSIS — R002 Palpitations: Secondary | ICD-10-CM

## 2024-02-06 DIAGNOSIS — I77810 Thoracic aortic ectasia: Secondary | ICD-10-CM | POA: Diagnosis not present

## 2024-02-06 DIAGNOSIS — E785 Hyperlipidemia, unspecified: Secondary | ICD-10-CM | POA: Diagnosis not present

## 2024-02-06 DIAGNOSIS — R0789 Other chest pain: Secondary | ICD-10-CM | POA: Diagnosis not present

## 2024-02-06 NOTE — Patient Instructions (Signed)
 Medication Instructions:  Your physician recommends that you continue on your current medications as directed. Please refer to the Current Medication list given to you today.  *If you need a refill on your cardiac medications before your next appointment, please call your pharmacy*   Lab Work: NONE If you have labs (blood work) drawn today and your tests are completely normal, you will receive your results only by: MyChart Message (if you have MyChart) OR A paper copy in the mail If you have any lab test that is abnormal or we need to change your treatment, we will call you to review the results.   Testing/Procedures: NONE   Follow-Up: At [facility], you and your health needs are our priority.  As part of our continuing mission to provide you with exceptional heart care, we have created designated Provider Care Teams.  These Care Teams include your primary Cardiologist (physician) and Advanced Practice Providers (APPs -  Physician Assistants and Nurse Practitioners) who all work together to provide you with the care you need, when you need it.  We recommend signing up for the patient portal called "MyChart".  Sign up information is provided on this After Visit Summary.  MyChart is used to connect with patients for Virtual Visits (Telemedicine).  Patients are able to view lab/test results, encounter notes, upcoming appointments, etc.  Non-urgent messages can be sent to your provider as well.   To learn more about what you can do with MyChart, go to [email or web address].    Your next appointment:   6 month(s)  Provider:   Jari Favre, PA-C  Other Instructions   1st Floor: - Lobby - Registration  - Pharmacy  - Lab - Cafe  2nd Floor: - PV Lab - Diagnostic Testing (echo, CT, nuclear med)  3rd Floor: - Vacant  4th Floor: - TCTS (cardiothoracic surgery) - AFib Clinic - Structural Heart Clinic - Vascular Surgery  - Vascular Ultrasound  5th Floor: -  HeartCare Cardiology (general and EP) - Clinical Pharmacy for coumadin, hypertension, lipid, weight-loss medications, and med management appointments    Valet parking services will be available as well.

## 2024-02-06 NOTE — Progress Notes (Signed)
 Office Visit    Patient Name: [name] [name] Date of Encounter: 02/06/2024  PCP:  Corliss Blacker, MD   [facility]  Cardiologist:  Donato Schultz, MD  Advanced Practice Provider:  No care team member to display Electrophysiologist:  None   HPI    [name] L Cogswell is a 78 y.o. female with a past medical history of dilated ascending aorta, prior stroke with residual left-sided weakness, diabetes, and hyperlipidemia presents today for follow-up visit.  She was admitted 05/2021 with chest pain.  Underwent left heart catheterization that showed widely patent coronary arteries with only minimal luminal irregularities in the LAD and circumflex.  Echo showed EF 55 to 60%, G1 DD, mild MR.  She was last seen 12/09/2021 by Dr. Anne Fu at which time no changes were made and 1 year follow-up was advised.  She was last seen in our office 04/13/2022 and received referral from PCP for evaluation.  The patient at the time was unsure of the exact reason but was advised to come in for sooner follow-up.  Reported more chest pain recently over approximately 3 months but upon further discussion was describing more difficulty breathing when laying on left side and palpitations that she felt most often when laying down.  Felt better when she turned on her right side and elevated her head slightly.  Occasionally felt palpitations during the day.  Some shortness of breath when working in her yard.  Occasional lightheadedness.  No syncope or presyncope.  Cardiac monitor revealed sinus rhythm 65 bpm on average, occasional short episode of atrial tachycardia, rare PAC/PVC, no atrial fibrillation or pauses.  She was seen in follow-up 07/06/2022 and at that time she reported dyspnea had improved.  She did not have any specific concerns.  Realizing that she cannot do some things that she previously could do such as moving furniture or going up and down ladders.  Enjoyed gardening.  Denied any cardiac  symptoms.  She was seen by me 01/22/2023, she states that she is under a lot of stress because her son is sick and his wife is not treating him well.  She keeps the family in the dark about a lot medical decisions.  Blood pressure is elevated today and was fine a few months ago.  She does get some fluttering in her chest when she gets really upset.  She does not sleep well at night because she constantly is worrying.  We discussed how the stress is affecting her health and her blood pressure.  I did suggest reaching out to Lasandra Beech to see if she could provide some resources to help remedy the situation.  Seen in the ED for acute mastoiditis, otitis media on the left 3/13.  She was seen by me 05/10/2023, she tells me that he is actually taking 20 mg of lisinopril.  Blood pressure still in the 180s systolic over 100 diastolic.  She is having weakness in her left arm and palpitations 78.  She states that she has been having chest pains for 2 weeks.  She thought it was indigestion but it does get worse with activity.  No lower extremity swelling.  Some shortness of breath with activity but none at rest.  We discussed that a few options for an ischemic workup.  We have titrated her blood pressure medications and educated her on how to keep track daily.  She does tell me that she has lightheaded and dizzy at times but she does have  a diagnosis of vertigo as well.  She saw me 05/2023, she tells me she is not having any  dizziness and lightheadedness. BP was so high before but now is much better. 125/70 at home, she forgot to bring her log. No chest pain,  SOB or palpitations. Feeling good today. I reviewed her CTA with her today. No significant CAD and no aortic aneurysm. Discussed repeating a lipid panel and LFTs in a few weeks (she has been of her statin for testing).  Reports no shortness of breath nor dyspnea on exertion. Reports no chest pain, pressure, or tightness. No edema, orthopnea, PND. Reports no  palpitations.   Today, she presents with a history of hypertension and aortic enlargement,  with intermittent chest pain and increasing shortness of breath, particularly with activity. She also reports difficulty swallowing, described as a sensation of food sticking in her throat, which occurs inconsistently after eating. She has been managing this symptom by consuming soft foods and drinking plenty of water. The patient was recently hospitalized due to chest pain and left-sided numbness. During the hospital stay, she underwent a series of tests including a CT scan of the head, an MRI, and an echocardiogram. All tests were negative for stroke or heart attack. The patient was placed under observation and discharged with a recommendation for follow-up imaging for her aorta.  Reports no chest pain, pressure, or tightness. No edema, orthopnea, PND. Reports no palpitations.   Discussed the use of AI scribe software for clinical note transcription with the patient, who gave verbal consent to proceed.  Past Medical History    Past Medical History:  Diagnosis Date   Arthritis    Bronchitis    hx   Chest pain 2009   MV with no scar or ischemia, EF 65%   Complication of anesthesia    claustrophobic !   CVA (cerebral vascular accident) (HCC) 05/05/2012   Diabetes mellitus    GERD (gastroesophageal reflux disease)    History of cardiac catheterization    a. Myoview 2/15 with anteroapical ischemia, EF 64% (intermediate risk) >> LHC - normal cos, EF 55-60%   Hypertension    Hypokalemia 06/10/2021   Neuromuscular disorder (HCC)    arotic Aneurysm   Obesity (BMI 35.0-39.9 without comorbidity)    Pneumonia    Stroke [facility])    with some resid L sided weakness   Thoracic ascending aortic aneurysm (HCC)    a. CT 1/17: ascending aorta 4.2 x 4.0 cm   Past Surgical History:  Procedure Laterality Date   ABDOMINAL HYSTERECTOMY  2006   Partial, one ovary left    CARDIAC CATHETERIZATION  2004    [place], [place] per pt.   CHOLECYSTECTOMY     EAR CYST EXCISION Left 09/20/2016   Procedure: EXCISION  LEFT EAR LESION;  Surgeon: Newman Pies, MD;  Location: [facility] OR;  Service: ENT;  Laterality: Left;   LEFT HEART CATH AND CORONARY ANGIOGRAPHY N/A 06/10/2021   Procedure: LEFT HEART CATH AND CORONARY ANGIOGRAPHY;  Surgeon: Tonny Bollman, MD;  Location[facility] INVASIVE CV LAB;  Service: Cardiovascular;  Laterality: N/A;   LEFT HEART CATHETERIZATION WITH CORONARY ANGIOGRAM N/A 01/09/2014   Procedure: LEFT HEART CATHETERIZATION WITH CORONARY ANGIOGRAM;  Surgeon: Marykay Lex, MD;  Location: [facility] CATH LAB;  Service: Cardiovascular;  Laterality: N/A;   MENISCUS REPAIR Right    ROTATOR CUFF REPAIR Left    SKIN SPLIT GRAFT Left 09/20/2016   Procedure: SKIN GRAFT SPLIT THICKNESS;  Surgeon: Newman Pies, MD;  Location: [facility] OR;  Service: ENT;  Laterality: Left;   TEMPOROMANDIBULAR JOINT SURGERY     TONSILLECTOMY     TYMPANOSTOMY TUBE PLACEMENT Left 06/03/2022   WISDOM TOOTH EXTRACTION      Allergies  Allergies  Allergen Reactions   Lidocaine     "Messed with my nerves".   Adhesive [Tape] Rash    pls use elastic wraps instead of adhesive tape   Codeine Nausea And Vomiting   Empagliflozin Other (See Comments)    Headaches & lower back pain    EKGs/Labs/Other Studies Reviewed:   The following studies were reviewed today:   Cardiac monitor 6/27   Sinus rhythm 65 bpm on average Occasional short episodes of atrial tachycardia-benign. Rare PACs, rare PVCs No atrial fibrillation no pauses no adverse arrhythmias.   LHC 06/10/21   Widely patent coronary arteries with minimal irregularities in the LAD and LCx, angiographically normal left main and RCA. Normal LVEDP Tortuous right subclavian artery   Suspect non-cardiac chest pain     Echo 06/10/21    1. Compared to 02/09/20, findings are similar.   2. Left ventricular ejection fraction, by estimation, is 55 to 60%. The  left ventricle has  normal function. The left ventricle has no regional  wall motion abnormalities. Left ventricular diastolic parameters are  consistent with Grade I diastolic  dysfunction (impaired relaxation).   3. Right ventricular systolic function is normal. The right ventricular  size is normal. There is mildly elevated pulmonary artery systolic  pressure.   4. The mitral valve is normal in structure. Mild mitral valve  regurgitation. No evidence of mitral stenosis.   5. The aortic valve is tricuspid. Aortic valve regurgitation is trivial.  No aortic stenosis is present.   6. Aortic dilatation noted. There is mild dilatation of the ascending  aorta and of the aortic root, measuring 38 mm.   7. The inferior vena cava is normal in size with greater than 50%  respiratory variability, suggesting right atrial pressure of 3 mmHg.    Cardiac monitor 10/11/16   No atrial fibrillation No pauses Average heart rate 62 bpm   Reassuring monitor  EKG:  EKG is not ordered today.    Recent Labs: 01/02/2024: ALT 10; B Natriuretic Peptide 38.2; BUN 18; Creatinine, Ser 1.16; Hemoglobin 12.8; Magnesium 1.7; Platelets 243; Potassium 3.6; Sodium 138  Recent Lipid Panel    Component Value Date/Time   CHOL 115 01/02/2024 0543   TRIG 76 01/02/2024 0543   HDL 39 (L) 01/02/2024 0543   CHOLHDL 2.9 01/02/2024 0543   VLDL 15 01/02/2024 0543   LDLCALC 61 01/02/2024 0543    Home Medications   Current Meds  Medication Sig   Acetaminophen Extra Strength 500 MG TABS Take 1 tablet by mouth 3 (three) times daily as needed.   albuterol (PROVENTIL HFA;VENTOLIN HFA) 108 (90 BASE) MCG/ACT inhaler Inhale 1-2 puffs into the lungs every 6 (six) hours as needed for wheezing.   aspirin EC 81 MG tablet Take 81 mg by mouth daily.   atorvastatin (LIPITOR) 10 MG tablet Take 10 mg by mouth daily.   Cholecalciferol (VITAMIN D3) 1000 units CAPS Take 1 capsule by mouth daily.   fluticasone (FLONASE) 50 MCG/ACT nasal spray Place 2  sprays into both nostrils as needed for allergies.   glimepiride (AMARYL) 2 MG tablet Take 2 mg by mouth daily.   glucose blood (PRECISION QID TEST) test strip 1 each by Other route as needed.   hydrochlorothiazide (MICROZIDE) 12.5 MG  capsule Take 1 capsule (12.5 mg total) by mouth daily.   linagliptin (TRADJENTA) 5 MG TABS tablet Take 5 mg by mouth daily.   lisinopril (ZESTRIL) 40 MG tablet Take 1 tablet (40 mg total) by mouth daily.   metoprolol succinate (TOPROL-XL) 50 MG 24 hr tablet TAKE 1 TABLET BY MOUTH DAILY WITH OR IMMEDIATELY FOLLOWING A MEAL (Patient taking differently: Take 50 mg by mouth daily.)   omeprazole (PRILOSEC) 40 MG capsule Take 40 mg by mouth daily.   triamcinolone cream (KENALOG) 0.5 % Apply 1 Application topically 2 (two) times daily.     Review of Systems      All other systems reviewed and are otherwise negative except as noted above.  Physical Exam    VS:  BP 114/62   Pulse 74   Ht 5\' 3"  (1.6 m)   Wt 179 lb 12.8 oz (81.6 kg)   SpO2 97%   BMI 31.85 kg/m  , BMI Body mass index is 31.85 kg/m.  Wt Readings from Last 3 Encounters:  02/06/24 179 lb 12.8 oz (81.6 kg)  09/15/23 181 lb (82.1 kg)  06/05/23 181 lb (82.1 kg)     GEN: Well nourished, well developed, in no acute distress. HEENT: normal. Neck: Supple, no JVD, carotid bruits, or masses. Cardiac: RRR, no murmurs, rubs, or gallops. No clubbing, cyanosis, edema.  Radials/PT 2+ and equal bilaterally.  Respiratory:  Respirations regular and unlabored, clear to auscultation bilaterally. GI: Soft, nontender, nondistended. MS: No deformity or atrophy. Skin: Warm and dry, no rash. Neuro:  Strength and sensation are intact. Psych: Normal affect.  Assessment & Plan    Chest Pain Intermittent chest pain with dyspnea. Echocardiogram showed low normal ejection fraction and mild mitral valve regurgitation. No significant coronary artery disease. Dyspnea possibly pulmonary-related. - Encourage use of  albuterol inhaler during dyspnea episodes to assess effectiveness. - Consider pulmonary evaluation if symptoms persist.  Aortic Aneurysm Aortic enlargement stable at 42 mm on echocardiogram and 40 mm on CT scan. Blood pressure control essential to prevent enlargement. - Continue annual CT scan to monitor aortic size. - Maintain blood pressure control.  Hypertension Blood pressure well-controlled at 114/62 mmHg. Essential for preventing aortic aneurysm complications. - Continue current antihypertensive regimen.  Hyperlipidemia LDL at 61 mg/dL, triglycerides at 76 mg/dL. No significant coronary artery disease. Cholesterol management crucial to prevent aortic plaque buildup. - Continue current lipid-lowering therapy.  Dysphagia Difficulty swallowing with possible esophageal narrowing. GERD suspected but symptoms persist. Barium swallow study recommended. - Recommend barium swallow study to evaluate esophageal structure. - Continue acid reflux medication. - Advise soft foods and increased water intake.  Follow-up Routine follow-up in six months unless symptoms change. - Schedule follow-up appointment in six months. - Provide new building information and address for future visits       Disposition: Follow up 6 months with Donato Schultz, MD or APP.  Signed, Sharlene Dory, PA-C 02/06/2024, 4:46 PM [facility]

## 2024-02-14 ENCOUNTER — Other Ambulatory Visit: Payer: Self-pay | Admitting: Family Medicine

## 2024-02-14 DIAGNOSIS — R1312 Dysphagia, oropharyngeal phase: Secondary | ICD-10-CM

## 2024-02-21 ENCOUNTER — Other Ambulatory Visit

## 2024-03-04 ENCOUNTER — Ambulatory Visit
Admission: RE | Admit: 2024-03-04 | Discharge: 2024-03-04 | Disposition: A | Discharge status: Home / Self Care | Source: Ambulatory Visit | Attending: Family Medicine | Admitting: Family Medicine

## 2024-03-04 DIAGNOSIS — R1312 Dysphagia, oropharyngeal phase: Secondary | ICD-10-CM

## 2024-05-19 ENCOUNTER — Other Ambulatory Visit (HOSPITAL_COMMUNITY): Payer: Self-pay | Admitting: Internal Medicine

## 2024-05-19 DIAGNOSIS — R600 Localized edema: Secondary | ICD-10-CM

## 2024-05-19 DIAGNOSIS — I10 Essential (primary) hypertension: Secondary | ICD-10-CM

## 2024-05-19 DIAGNOSIS — R5383 Other fatigue: Secondary | ICD-10-CM

## 2024-07-13 ENCOUNTER — Ambulatory Visit
Admission: EM | Admit: 2024-07-13 | Discharge: 2024-07-13 | Disposition: A | Discharge status: Home / Self Care | Attending: Emergency Medicine | Admitting: Emergency Medicine

## 2024-07-13 ENCOUNTER — Ambulatory Visit

## 2024-07-13 DIAGNOSIS — M25522 Pain in left elbow: Secondary | ICD-10-CM | POA: Diagnosis not present

## 2024-07-13 DIAGNOSIS — M25562 Pain in left knee: Secondary | ICD-10-CM

## 2024-07-13 DIAGNOSIS — W19XXXA Unspecified fall, initial encounter: Secondary | ICD-10-CM | POA: Diagnosis not present

## 2024-07-13 MED ORDER — DICLOFENAC SODIUM 1 % EX GEL: 2.0000 g | Freq: Four times a day (QID) | CUTANEOUS | 0 refills | Status: AC

## 2024-07-13 NOTE — Discharge Instructions (Signed)
 Patient seen today for concerns of a fall and pain in her left knee and left elbow.  The x-rays of your elbow and ear thankfully reassuring with no obvious signs of any fracture or dislocation.  There is some swelling primarily to the knee which is likely from your fall.  I would recommend that you use a knee sleeve for additional support with walking.  You are free to walk as pain allows.  Continues in Tylenol  as well as Voltaren  for pain control.  Follow-up with your primary care provider for further evaluation.  For any concerns or worsening symptoms, return to the urgent care.

## 2024-07-13 NOTE — ED Triage Notes (Addendum)
 Pt reports having a fall yesterday, falling on left elbow and left knee. Pt states she does not remember if she hit her head. The patient states she is not able to place much weight on her left knee.  There is swelling to the left knee, patient is A&Ox4  Home interventions: ice pack to left knee  Patients daughter is at the bedside.

## 2024-07-13 NOTE — ED Provider Notes (Signed)
 [facility]    CSN: [id number] Arrival date & time: 07/13/24  1103      History   Chief Complaint Chief Complaint  Patient presents with   Fall    HPI [name] [name] is a 78 y.o. female.  Patient with past history significant for diabetes, hypertension, GERD, CHF, pulmonary pretension here today with concerns of a fall.  Patient reportedly took a mechanical fall yesterday while she was at a store and landed on the left side her body and now has pain to the left elbow and left knee.  Denies any head strike or impact and no loss of consciousness.  She takes a baby aspirin  daily but no blood thinner use.  Reports having difficulty bearing weight on the left knee.  Has used ice on the knee but denies significant.  Pain.   Fall    Past Medical History:  Diagnosis Date   Arthritis    Bronchitis    hx   Chest pain 2009   MV with no scar or ischemia, EF 65%   Complication of anesthesia    claustrophobic !   CVA (cerebral vascular accident) (HCC) 05/05/2012   Diabetes mellitus    GERD (gastroesophageal reflux disease)    History of cardiac catheterization    a. Myoview  2/15 with anteroapical ischemia, EF 64% (intermediate risk) >> LHC - normal cos, EF 55-60%   Hypertension    Hypokalemia 06/10/2021   Neuromuscular disorder (HCC)    arotic Aneurysm   Obesity (BMI 35.0-39.9 without comorbidity)    Pneumonia    Stroke [facility])    with some resid L sided weakness   Thoracic ascending aortic aneurysm (HCC)    a. CT 1/17: ascending aorta 4.2 x 4.0 cm    Patient Active Problem List   Diagnosis Date Noted   Chronic kidney disease, stage 3a (HCC) 01/02/2024   TIA (transient ischemic attack) 01/01/2024   Dysarthria 01/01/2024   High anion gap metabolic acidosis 01/01/2024   Mastoiditis, acute, left 01/31/2023   Plantar flexed metatarsal bone of left foot 11/29/2022   History of stroke 12/09/2021   Aortic root dilation (HCC) 06/10/2021   Unstable angina (HCC)  06/09/2021   Diabetic polyneuropathy associated with type 2 diabetes mellitus (HCC) 01/11/2021   Pelvic relaxation due to cystocele, midline 07/15/2020   Encounter for fitting and adjustment of pessary 07/15/2020   Nontraumatic complete tear of left rotator cuff 01/22/2020   Chronic sinusitis    Complicated migraine    Pure hypercholesterolemia    History of TIA (transient ischemic attack)    Atypical chest pain    Osteopenia 08/25/2016   Chronic diastolic CHF (congestive heart failure) (HCC) 12/07/2015   Pulmonary HTN (HCC) 12/07/2015   Chronic chest pain 12/07/2015   Thoracic ascending aortic aneurysm (HCC) 12/07/2015   Left sided numbness 12/07/2015   Breast density 11/30/2015   DDD (degenerative disc disease), lumbar 07/07/2015   Bradycardia 05/06/2012   Obesity due to excess calories 04/25/2012   Cystocele 03/22/2012   DM2 (diabetes mellitus, type 2) (HCC) 03/22/2012   Essential hypertension 03/22/2012   GERD (gastroesophageal reflux disease) 03/22/2012    Past Surgical History:  Procedure Laterality Date   ABDOMINAL HYSTERECTOMY  2006   Partial, one ovary left    CARDIAC CATHETERIZATION  2004   [place] per pt.   CHOLECYSTECTOMY     EAR CYST EXCISION Left 09/20/2016   Procedure: EXCISION  LEFT EAR LESION;  Surgeon: Daniel Moccasin, MD;  Location: [facility] OR;  Service: ENT;  Laterality: Left;   LEFT HEART CATH AND CORONARY ANGIOGRAPHY N/A 06/10/2021   Procedure: LEFT HEART CATH AND CORONARY ANGIOGRAPHY;  Surgeon: Wonda Sharper, MD;  Location: [facility] INVASIVE CV LAB;  Service: Cardiovascular;  Laterality: N/A;   LEFT HEART CATHETERIZATION WITH CORONARY ANGIOGRAM N/A 01/09/2014   Procedure: LEFT HEART CATHETERIZATION WITH CORONARY ANGIOGRAM;  Surgeon: Alm LELON Clay, MD;  Location: [facility] CATH LAB;  Service: Cardiovascular;  Laterality: N/A;   MENISCUS REPAIR Right    ROTATOR CUFF REPAIR Left    SKIN SPLIT GRAFT Left 09/20/2016   Procedure: SKIN GRAFT SPLIT THICKNESS;  Surgeon:  Daniel Moccasin, MD;  Location: [facility] OR;  Service: ENT;  Laterality: Left;   TEMPOROMANDIBULAR JOINT SURGERY     TONSILLECTOMY     TYMPANOSTOMY TUBE PLACEMENT Left 06/03/2022   WISDOM TOOTH EXTRACTION      OB History     Gravida  4   Para  4   Term  4   Preterm  0   AB  0   Living  4      SAB  0   IAB  0   Ectopic  0   Multiple  0   Live Births           Obstetric Comments  Delivered 4 large babies vaginally          Home Medications    Prior to Admission medications   Medication Sig Start Date End Date Taking? Authorizing Provider  diclofenac  Sodium (VOLTAREN ) 1 % GEL Apply 2 g topically 4 (four) times daily for 14 days. 07/13/24 07/27/24 Yes Sharon Rubis A, PA-C  Acetaminophen  Extra Strength 500 MG TABS Take 1 tablet by mouth 3 (three) times daily as needed. 06/29/23   [provider]  albuterol  (PROVENTIL  HFA;VENTOLIN  HFA) 108 (90 BASE) MCG/ACT inhaler Inhale 1-2 puffs into the lungs every 6 (six) hours as needed for wheezing. 07/29/13   Sonda Alm BROCKS, MD  aspirin  EC 81 MG tablet Take 81 mg by mouth daily.    [provider]  atorvastatin  (LIPITOR) 10 MG tablet Take 10 mg by mouth daily. 08/05/20   [provider]  Cholecalciferol  (VITAMIN D3) 1000 units CAPS Take 1 capsule by mouth daily.    [provider]  fluticasone  (FLONASE ) 50 MCG/ACT nasal spray Place 2 sprays into both nostrils as needed for allergies. 04/09/22   [provider]  glimepiride (AMARYL) 2 MG tablet Take 2 mg by mouth daily. 03/17/22   [provider]  glucose blood (PRECISION QID TEST) test strip 1 each by Other route as needed. 07/07/21   [provider]  hydrochlorothiazide  (MICROZIDE ) 12.5 MG capsule Take 1 capsule (12.5 mg total) by mouth daily. 06/05/23   Lucien Orren SAILOR, PA-C  linagliptin (TRADJENTA) 5 MG TABS tablet Take 5 mg by mouth daily.    [provider]  lisinopril  (ZESTRIL ) 40 MG tablet Take 1 tablet (40 mg total) by  mouth daily. 05/10/23   Conte, Tessa N, PA-C  metoprolol  succinate (TOPROL -XL) 50 MG 24 hr tablet TAKE 1 TABLET BY MOUTH DAILY WITH OR IMMEDIATELY FOLLOWING A MEAL Patient taking differently: Take 50 mg by mouth daily. 04/13/22   Swinyer, Rosaline HERO, NP  omeprazole (PRILOSEC) 40 MG capsule Take 40 mg by mouth daily. 08/03/23   [provider]  triamcinolone cream (KENALOG) 0.5 % Apply 1 Application topically 2 (two) times daily. 09/10/23   [provider]  Family History Family History  Problem Relation Age of Onset   Heart disease Father    Valvular heart disease Father        aortic valve replacement   Pancreatitis Mother    Diabetes Maternal Grandmother    Heart disease Brother    Valvular heart disease Brother        aortic valve replacement   CAD Son        heart attack   Kidney disease Son     Social History Social History   Tobacco Use   Smoking status: Never   Smokeless tobacco: Never  Vaping Use   Vaping status: Never Used  Substance Use Topics   Alcohol use: No   Drug use: Never     Allergies   Lidocaine , Adhesive [tape], Codeine, and Empagliflozin   Review of Systems Review of Systems  Musculoskeletal:        Knee pain  All other systems reviewed and are negative.    Physical Exam Triage Vital Signs ED Triage Vitals  Encounter Vitals Group     BP 07/13/24 1117 136/82     Girls Systolic BP Percentile --      Girls Diastolic BP Percentile --      Boys Systolic BP Percentile --      Boys Diastolic BP Percentile --      Pulse Rate 07/13/24 1117 68     Resp 07/13/24 1117 18     Temp 07/13/24 1117 97.9 F (36.6 C)     Temp Source 07/13/24 1117 Oral     SpO2 07/13/24 1117 95 %     Weight --      Height --      Head Circumference --      Peak Flow --      Pain Score 07/13/24 1115 10     Pain Loc --      Pain Education --      Exclude from Growth Chart --    No data found.  Updated Vital Signs BP 136/82 (BP Location: Right  Arm)   Pulse 68   Temp 97.9 F (36.6 C) (Oral)   Resp 18   SpO2 95%   Visual Acuity Right Eye Distance:   Left Eye Distance:   Bilateral Distance:    Right Eye Near:   Left Eye Near:    Bilateral Near:     Physical Exam Vitals and nursing note reviewed.  Constitutional:      General: She is not in acute distress.    Appearance: She is well-developed.  HENT:     Head: Normocephalic and atraumatic.  Eyes:     Conjunctiva/sclera: Conjunctivae normal.  Cardiovascular:     Rate and Rhythm: Normal rate and regular rhythm.     Heart sounds: No murmur heard. Pulmonary:     Effort: Pulmonary effort is normal. No respiratory distress.     Breath sounds: Normal breath sounds.  Abdominal:     Palpations: Abdomen is soft.     Tenderness: There is no abdominal tenderness.  Musculoskeletal:        General: No swelling.     Cervical back: Neck supple.  Skin:    General: Skin is warm and dry.     Capillary Refill: Capillary refill takes less than 2 seconds.  Neurological:     Mental Status: She is alert.  Psychiatric:        Mood and Affect: Mood normal.  UC Treatments / Results  Labs (all labs ordered are listed, but only abnormal results are displayed) Labs Reviewed - No data to display  EKG   Radiology DG Elbow Complete Left Result Date: 07/13/2024 EXAM: 3 VIEW(S) XRAY OF THE LEFT ELBOW COMPARISON: None available. CLINICAL HISTORY: Fall. FINDINGS: BONES AND JOINTS: Osteopenia. Chondrocalcinosis. Possible small elbow effusion. No acute fracture. No joint dislocation. SOFT TISSUES: The soft tissues are unremarkable. IMPRESSION: 1. Possible small left elbow effusion. 2. Osteopenia and chondrocalcinosis. Electronically signed by: Katheleen Faes MD 07/13/2024 12:24 PM EDT RP Workstation: [id number]   DG Knee Complete 4 Views Left Result Date: 07/13/2024 EXAM: 4 VIEW(S) XRAY OF THE LEFT KNEE 07/13/2024 11:58:29 AM COMPARISON: 04/23/2021 CLINICAL HISTORY: Fall. Pt reports  left knee pain and swelling after having a fall yesterday. The patient states she is not able to place much weight on her left knee. FINDINGS: BONES AND JOINTS: No acute fracture. No focal osseous lesion. No joint dislocation. Marginal spurring about all 3 compartments of the knee. Small effusion in the suprapatellar bursa, increased from previous. Mild periarticular osteopenia. SOFT TISSUES: The soft tissues are unremarkable. IMPRESSION: 1. No acute fracture or dislocation. 2. Small effusion in the suprapatellar bursa, increased from previous. 3. Marginal spurring about all 3 compartments of the knee. Electronically signed by: Katheleen Faes MD 07/13/2024 12:23 PM EDT RP Workstation: [id number]    Procedures Procedures (including critical care time)  Medications Ordered in UC Medications - No data to display  Initial Impression / Assessment and Plan / UC Course  I have reviewed the triage vital signs and the nursing notes.  Pertinent labs & imaging results that were available during my care of the patient were reviewed by me and considered in my medical decision making (see chart for details).  This patient presents to the UC for concern of fall.  Differential diagnosis includes joint effusion, patellar dislocation, tibial fracture, distal femur fracture   Imaging Studies ordered:  I ordered imaging studies including x-ray of the left knee, left elbow I independently visualized and interpreted imaging which showed xrays of the left elbow  I agree with the radiologist interpretation   Problem List /UC course:  Patient presents to the urgent care today with concerns of a fall.  Reports took mechanical fall yesterday and landed on the left side of her body injuring her left knee and left elbow.  States the pain is primarily worse in the left knee with some difficulty walking due to pain. Physical exam reveals generalized pain and slight swelling in the left knee.  Left knee unremarkable.   Mobility of the left knee is somewhat difficult due to pain.  No reported tingling or numbness. X-rays of left knee and left elbow are unremarkable.  Small joint effusion at the suprapatellar bursa which is increased from prior which may be reactive in nature from patient's fall.  Advised patient to continue using Tylenol  for pain and will discharge with a prescription for Voltaren  gel.  Also encouraged use of a knee sleeve for additional support over the next 4 days and encourage weightbearing activity as tolerated.  Otherwise stable this time for outpatient follow-up and discharged home.   Social Determinants of Health:  None  Final Clinical Impressions(s) / UC Diagnoses   Final diagnoses:  Fall, initial encounter  Acute pain of left knee  Left elbow pain     Discharge Instructions      Patient seen today for concerns of a fall and pain in her  left knee and left elbow.  The x-rays of your elbow and ear thankfully reassuring with no obvious signs of any fracture or dislocation.  There is some swelling primarily to the knee which is likely from your fall.  I would recommend that you use a knee sleeve for additional support with walking.  You are free to walk as pain allows.  Continues in Tylenol  as well as Voltaren  for pain control.  Follow-up with your primary care provider for further evaluation.  For any concerns or worsening symptoms, return to the urgent care.     ED Prescriptions     Medication Sig Dispense Auth. Provider   diclofenac  Sodium (VOLTAREN ) 1 % GEL Apply 2 g topically 4 (four) times daily for 14 days. 100 g Purl Claytor A, PA-C      PDMP not reviewed this encounter.   Ragan Reale A, PA-C 07/13/24 1253

## 2024-08-01 ENCOUNTER — Encounter: Payer: Self-pay | Admitting: Physician Assistant

## 2024-08-10 ENCOUNTER — Other Ambulatory Visit: Payer: Self-pay | Admitting: Physician Assistant

## 2024-10-13 ENCOUNTER — Encounter: Payer: Self-pay | Admitting: *Deleted

## 2024-10-13 ENCOUNTER — Ambulatory Visit: Admission: EM | Admit: 2024-10-13 | Discharge: 2024-10-13 | Disposition: A | Discharge status: Home / Self Care

## 2024-10-13 DIAGNOSIS — L255 Unspecified contact dermatitis due to plants, except food: Secondary | ICD-10-CM | POA: Diagnosis not present

## 2024-10-13 MED ORDER — HYDROXYZINE HCL 25 MG PO TABS: 25.0000 mg | ORAL_TABLET | ORAL | 0 refills | Status: DC | PRN

## 2024-10-13 MED ORDER — PREDNISONE 10 MG (48) PO TBPK: ORAL_TABLET | ORAL | 0 refills | Status: DC

## 2024-10-13 NOTE — ED Triage Notes (Signed)
 Pt states she was helping cut down a tree last week and believes she has poison ivy. She has a rash it's going all over me. Using calamine and OTC cream without relief. Rash is on both arms, chest, and face

## 2024-10-13 NOTE — Discharge Instructions (Addendum)
 You have been prescribed steroids for your illness and you have diabetes.  Steroids may raise your blood sugar, so check blood sugar more frequently.  If your blood sugar gets to 400 or above you need to report to the ER immediately.

## 2024-10-13 NOTE — ED Provider Notes (Signed)
 [facility]    CSN: [id number] Arrival date & time: 10/13/24  1037      History   Chief Complaint Chief Complaint  Patient presents with   Rash    HPI [name] [name] is a 78 y.o. female.   Pt presents today due to scattered, erythematous, and markedly pruritic eruption since cutting down a tree last week. Pt states that she has been using calamine lotion and OTC anti-itch cream with no significant relief.   The history is provided by the patient.  Rash   Past Medical History:  Diagnosis Date   Arthritis    Bronchitis    hx   Chest pain 2009   MV with no scar or ischemia, EF 65%   Complication of anesthesia    claustrophobic !   CVA (cerebral vascular accident) (HCC) 05/05/2012   Diabetes mellitus    GERD (gastroesophageal reflux disease)    History of cardiac catheterization    a. Myoview  2/15 with anteroapical ischemia, EF 64% (intermediate risk) >> LHC - normal cos, EF 55-60%   Hypertension    Hypokalemia 06/10/2021   Neuromuscular disorder (HCC)    arotic Aneurysm   Obesity (BMI 35.0-39.9 without comorbidity)    Pneumonia    Stroke [facility])    with some resid L sided weakness   Thoracic ascending aortic aneurysm    a. CT 1/17: ascending aorta 4.2 x 4.0 cm    Patient Active Problem List   Diagnosis Date Noted   Chronic kidney disease, stage 3a (HCC) 01/02/2024   TIA (transient ischemic attack) 01/01/2024   Dysarthria 01/01/2024   High anion gap metabolic acidosis 01/01/2024   Mastoiditis, acute, left 01/31/2023   Plantar flexed metatarsal bone of left foot 11/29/2022   History of stroke 12/09/2021   Aortic root dilation 06/10/2021   Unstable angina (HCC) 06/09/2021   Diabetic polyneuropathy associated with type 2 diabetes mellitus (HCC) 01/11/2021   Pelvic relaxation due to cystocele, midline 07/15/2020   Encounter for fitting and adjustment of pessary 07/15/2020   Nontraumatic complete tear of left rotator cuff 01/22/2020   Chronic  sinusitis    Complicated migraine    Pure hypercholesterolemia    History of TIA (transient ischemic attack)    Atypical chest pain    Osteopenia 08/25/2016   Chronic diastolic CHF (congestive heart failure) (HCC) 12/07/2015   Pulmonary HTN (HCC) 12/07/2015   Chronic chest pain 12/07/2015   Thoracic ascending aortic aneurysm 12/07/2015   Left sided numbness 12/07/2015   Breast density 11/30/2015   DDD (degenerative disc disease), lumbar 07/07/2015   Bradycardia 05/06/2012   Obesity due to excess calories 04/25/2012   Cystocele 03/22/2012   DM2 (diabetes mellitus, type 2) (HCC) 03/22/2012   Essential hypertension 03/22/2012   GERD (gastroesophageal reflux disease) 03/22/2012    Past Surgical History:  Procedure Laterality Date   ABDOMINAL HYSTERECTOMY  2006   Partial, one ovary left    CARDIAC CATHETERIZATION  2004   [place] per pt.   CHOLECYSTECTOMY     EAR CYST EXCISION Left 09/20/2016   Procedure: EXCISION  LEFT EAR LESION;  Surgeon: Daniel Moccasin, MD;  Location: [facility] OR;  Service: ENT;  Laterality: Left;   LEFT HEART CATH AND CORONARY ANGIOGRAPHY N/A 06/10/2021   Procedure: LEFT HEART CATH AND CORONARY ANGIOGRAPHY;  Surgeon: Wonda Sharper, MD;  Location[facility] INVASIVE CV LAB;  Service: Cardiovascular;  Laterality: N/A;   LEFT HEART CATHETERIZATION WITH CORONARY ANGIOGRAM N/A 01/09/2014   Procedure:  LEFT HEART CATHETERIZATION WITH CORONARY ANGIOGRAM;  Surgeon: Alm LELON Clay, MD;  Location: [facility] CATH LAB;  Service: Cardiovascular;  Laterality: N/A;   MENISCUS REPAIR Right    ROTATOR CUFF REPAIR Left    SKIN SPLIT GRAFT Left 09/20/2016   Procedure: SKIN GRAFT SPLIT THICKNESS;  Surgeon: Daniel Moccasin, MD;  Location: [facility] OR;  Service: ENT;  Laterality: Left;   TEMPOROMANDIBULAR JOINT SURGERY     TONSILLECTOMY     TYMPANOSTOMY TUBE PLACEMENT Left 06/03/2022   WISDOM TOOTH EXTRACTION      OB History     Gravida  4   Para  4   Term  4   Preterm  0   AB  0   Living  4       SAB  0   IAB  0   Ectopic  0   Multiple  0   Live Births           Obstetric Comments  Delivered 4 large babies vaginally          Home Medications    Prior to Admission medications   Medication Sig Start Date End Date Taking? Authorizing Provider  Accu-Chek Softclix Lancets lancets daily. 05/02/24  Yes [provider]  Acetaminophen  Extra Strength 500 MG TABS Take 1 tablet by mouth 3 (three) times daily as needed. 06/29/23  Yes [provider]  albuterol  (PROVENTIL  HFA;VENTOLIN  HFA) 108 (90 BASE) MCG/ACT inhaler Inhale 1-2 puffs into the lungs every 6 (six) hours as needed for wheezing. 07/29/13  Yes Sonda Alm BROCKS, MD  aspirin  EC 81 MG tablet Take 81 mg by mouth daily.   Yes [provider]  atorvastatin  (LIPITOR) 10 MG tablet Take 10 mg by mouth daily. 08/05/20  Yes [provider]  Blood Glucose Monitoring Suppl (ACCU-CHEK GUIDE) w/Device KIT daily. 05/02/24  Yes [provider]  Cholecalciferol  (VITAMIN D3) 1000 units CAPS Take 1 capsule by mouth daily.   Yes [provider]  fluticasone  (FLONASE ) 50 MCG/ACT nasal spray Place 2 sprays into both nostrils as needed for allergies. 04/09/22  Yes [provider]  glimepiride (AMARYL) 2 MG tablet Take 2 mg by mouth daily. 03/17/22  Yes [provider]  hydrochlorothiazide  (MICROZIDE ) 12.5 MG capsule Take 1 capsule (12.5 mg total) by mouth daily. 06/05/23  Yes Conte, Tessa N, PA-C  hydrOXYzine  (ATARAX ) 25 MG tablet Take 1 tablet (25 mg total) by mouth every 4 (four) hours as needed for itching. 10/13/24  Yes Andra Krabbe C, PA-C  linagliptin (TRADJENTA) 5 MG TABS tablet Take 5 mg by mouth daily.   Yes [provider]  lisinopril  (ZESTRIL ) 40 MG tablet Take 1 tablet (40 mg total) by mouth daily. 05/10/23  Yes Conte, Tessa N, PA-C  metoprolol  succinate (TOPROL -XL) 50 MG 24 hr tablet TAKE 1 TABLET BY MOUTH DAILY WITH OR IMMEDIATELY FOLLOWING A  MEAL Patient taking differently: Take 50 mg by mouth daily. 04/13/22  Yes Swinyer, Rosaline HERO, NP  omeprazole (PRILOSEC) 40 MG capsule Take 40 mg by mouth daily. 08/03/23  Yes [provider]  predniSONE  (STERAPRED UNI-PAK 48 TAB) 10 MG (48) TBPK tablet Take as directed on back of package 10/13/24  Yes Andra Krabbe C, PA-C  triamcinolone cream (KENALOG) 0.5 % Apply 1 Application topically 2 (two) times daily. 09/10/23  Yes [provider]  glucose blood (PRECISION QID TEST) test strip 1 each by Other route as needed. 07/07/21   [provider]  ketoconazole (NIZORAL) 2 %  cream Apply topically 2 (two) times daily as needed. Patient not taking: Reported on 10/13/2024 04/30/24   [provider]    Family History Family History  Problem Relation Age of Onset   Heart disease Father    Valvular heart disease Father        aortic valve replacement   Pancreatitis Mother    Diabetes Maternal Grandmother    Heart disease Brother    Valvular heart disease Brother        aortic valve replacement   CAD Son        heart attack   Kidney disease Son     Social History Social History   Tobacco Use   Smoking status: Never   Smokeless tobacco: Never  Vaping Use   Vaping status: Never Used  Substance Use Topics   Alcohol use: No   Drug use: Never     Allergies   Lidocaine , Adhesive [tape], Codeine, and Empagliflozin   Review of Systems Review of Systems  Skin:  Positive for rash.     Physical Exam Triage Vital Signs ED Triage Vitals  Encounter Vitals Group     BP 10/13/24 1214 (!) 151/81     Girls Systolic BP Percentile --      Girls Diastolic BP Percentile --      Boys Systolic BP Percentile --      Boys Diastolic BP Percentile --      Pulse Rate 10/13/24 1214 80     Resp 10/13/24 1214 18     Temp 10/13/24 1214 97.8 F (36.6 C)     Temp Source 10/13/24 1214 Oral     SpO2 10/13/24 1214 98 %     Weight --      Height --      Head  Circumference --      Peak Flow --      Pain Score 10/13/24 1209 10     Pain Loc --      Pain Education --      Exclude from Growth Chart --    No data found.  Updated Vital Signs BP (!) 151/81 (BP Location: Right Arm)   Pulse 80   Temp 97.8 F (36.6 C) (Oral)   Resp 18   SpO2 98%   Visual Acuity Right Eye Distance:   Left Eye Distance:   Bilateral Distance:    Right Eye Near:   Left Eye Near:    Bilateral Near:     Physical Exam Vitals and nursing note reviewed.  Constitutional:      General: She is not in acute distress.    Appearance: Normal appearance. She is not ill-appearing, toxic-appearing or diaphoretic.  Eyes:     General: No scleral icterus. Cardiovascular:     Rate and Rhythm: Normal rate and regular rhythm.     Heart sounds: Normal heart sounds.  Pulmonary:     Effort: Pulmonary effort is normal. No respiratory distress.     Breath sounds: Normal breath sounds. No wheezing or rhonchi.  Skin:    General: Skin is warm.     Comments: Scattered erythematous eruption noted of arms, chest, and face  Neurological:     Mental Status: She is alert and oriented to person, place, and time.  Psychiatric:        Mood and Affect: Mood normal.        Behavior: Behavior normal.      UC Treatments / Results  Labs (all labs ordered  are listed, but only abnormal results are displayed) Labs Reviewed - No data to display  EKG   Radiology No results found.  Procedures Procedures (including critical care time)  Medications Ordered in UC Medications - No data to display  Initial Impression / Assessment and Plan / UC Course  I have reviewed the triage vital signs and the nursing notes.  Pertinent labs & imaging results that were available during my care of the patient were reviewed by me and considered in my medical decision making (see chart for details).    Final Clinical Impressions(s) / UC Diagnoses   Final diagnoses:  Contact dermatitis due to  plants, except food, unspecified contact dermatitis type     Discharge Instructions      You have been prescribed steroids for your illness and you have diabetes.  Steroids may raise your blood sugar, so check blood sugar more frequently.  If your blood sugar gets to 400 or above you need to report to the ER immediately.     ED Prescriptions     Medication Sig Dispense Auth. Provider   hydrOXYzine  (ATARAX ) 25 MG tablet Take 1 tablet (25 mg total) by mouth every 4 (four) hours as needed for itching. 30 tablet Andra Krabbe C, PA-C   predniSONE  (STERAPRED UNI-PAK 48 TAB) 10 MG (48) TBPK tablet Take as directed on back of package 48 tablet Andra Krabbe BROCKS, PA-C      PDMP not reviewed this encounter.   Andra Krabbe BROCKS, PA-C 10/13/24 1239

## 2024-12-20 ENCOUNTER — Ambulatory Visit (HOSPITAL_COMMUNITY)
Admission: RE | Admit: 2024-12-20 | Discharge: 2024-12-20 | Disposition: A | Discharge status: Home / Self Care | Source: Ambulatory Visit | Attending: Internal Medicine | Admitting: Internal Medicine

## 2024-12-20 ENCOUNTER — Encounter (HOSPITAL_COMMUNITY): Payer: Self-pay

## 2024-12-20 ENCOUNTER — Ambulatory Visit (INDEPENDENT_AMBULATORY_CARE_PROVIDER_SITE_OTHER)

## 2024-12-20 VITALS — BP 124/83 | HR 64 | Temp 97.6°F | Resp 16

## 2024-12-20 DIAGNOSIS — M25531 Pain in right wrist: Secondary | ICD-10-CM

## 2024-12-20 DIAGNOSIS — M19031 Primary osteoarthritis, right wrist: Secondary | ICD-10-CM | POA: Diagnosis not present

## 2024-12-20 DIAGNOSIS — M11231 Other chondrocalcinosis, right wrist: Secondary | ICD-10-CM | POA: Diagnosis not present

## 2024-12-20 MED ORDER — DICLOFENAC SODIUM 1 % EX GEL: 2.0000 g | Freq: Three times a day (TID) | CUTANEOUS | 0 refills | Status: AC

## 2024-12-20 MED ORDER — TRAMADOL HCL 50 MG PO TABS: 50.0000 mg | ORAL_TABLET | Freq: Three times a day (TID) | ORAL | 0 refills | Status: AC

## 2024-12-20 NOTE — ED Provider Notes (Signed)
 " [facility]    CSN: [id number] Arrival date & time: 12/20/24  1148      History   Chief Complaint Chief Complaint  Patient presents with   Wrist Pain    Entered by patient    HPI [name] [name] is a 79 y.o. female who presents with R distal forearm pain which starts from her elbow to her wrist x 6 days. She denies injuring herself. She normally does not eat hamburger and did eat it a week ago. Denies eating cold cuts. Has hx of mild OA of L wrist. Has never had gout. Pain is provoked with any wrist movement.     Past Medical History:  Diagnosis Date   Arthritis    Bronchitis    hx   Chest pain 2009   MV with no scar or ischemia, EF 65%   Complication of anesthesia    claustrophobic !   CVA (cerebral vascular accident) (HCC) 05/05/2012   Diabetes mellitus    GERD (gastroesophageal reflux disease)    History of cardiac catheterization    a. Myoview  2/15 with anteroapical ischemia, EF 64% (intermediate risk) >> LHC - normal cos, EF 55-60%   Hypertension    Hypokalemia 06/10/2021   Neuromuscular disorder (HCC)    arotic Aneurysm   Obesity (BMI 35.0-39.9 without comorbidity)    Pneumonia    Stroke [facility])    with some resid L sided weakness   Thoracic ascending aortic aneurysm    a. CT 1/17: ascending aorta 4.2 x 4.0 cm    Patient Active Problem List   Diagnosis Date Noted   Chronic kidney disease, stage 3a (HCC) 01/02/2024   TIA (transient ischemic attack) 01/01/2024   Dysarthria 01/01/2024   High anion gap metabolic acidosis 01/01/2024   Mastoiditis, acute, left 01/31/2023   Plantar flexed metatarsal bone of left foot 11/29/2022   History of stroke 12/09/2021   Aortic root dilation 06/10/2021   Unstable angina (HCC) 06/09/2021   Diabetic polyneuropathy associated with type 2 diabetes mellitus (HCC) 01/11/2021   Pelvic relaxation due to cystocele, midline 07/15/2020   Encounter for fitting and adjustment of pessary 07/15/2020   Nontraumatic complete  tear of left rotator cuff 01/22/2020   Chronic sinusitis    Complicated migraine    Pure hypercholesterolemia    History of TIA (transient ischemic attack)    Atypical chest pain    Osteopenia 08/25/2016   Chronic diastolic CHF (congestive heart failure) (HCC) 12/07/2015   Pulmonary HTN (HCC) 12/07/2015   Chronic chest pain 12/07/2015   Thoracic ascending aortic aneurysm 12/07/2015   Left sided numbness 12/07/2015   Breast density 11/30/2015   DDD (degenerative disc disease), lumbar 07/07/2015   Bradycardia 05/06/2012   Obesity due to excess calories 04/25/2012   Cystocele 03/22/2012   DM2 (diabetes mellitus, type 2) (HCC) 03/22/2012   Essential hypertension 03/22/2012   GERD (gastroesophageal reflux disease) 03/22/2012    Past Surgical History:  Procedure Laterality Date   ABDOMINAL HYSTERECTOMY  2006   Partial, one ovary left    CARDIAC CATHETERIZATION  2004   [place] per pt.   CHOLECYSTECTOMY     EAR CYST EXCISION Left 09/20/2016   Procedure: EXCISION  LEFT EAR LESION;  Surgeon: Daniel Moccasin, MD;  Location: [facility] OR;  Service: ENT;  Laterality: Left;   LEFT HEART CATH AND CORONARY ANGIOGRAPHY N/A 06/10/2021   Procedure: LEFT HEART CATH AND CORONARY ANGIOGRAPHY;  Surgeon: Wonda Sharper, MD;  Location: [facility] INVASIVE CV  LAB;  Service: Cardiovascular;  Laterality: N/A;   LEFT HEART CATHETERIZATION WITH CORONARY ANGIOGRAM N/A 01/09/2014   Procedure: LEFT HEART CATHETERIZATION WITH CORONARY ANGIOGRAM;  Surgeon: Alm LELON Clay, MD;  Location: [facility] CATH LAB;  Service: Cardiovascular;  Laterality: N/A;   MENISCUS REPAIR Right    ROTATOR CUFF REPAIR Left    SKIN SPLIT GRAFT Left 09/20/2016   Procedure: SKIN GRAFT SPLIT THICKNESS;  Surgeon: Daniel Moccasin, MD;  Location: [facility] OR;  Service: ENT;  Laterality: Left;   TEMPOROMANDIBULAR JOINT SURGERY     TONSILLECTOMY     TYMPANOSTOMY TUBE PLACEMENT Left 06/03/2022   WISDOM TOOTH EXTRACTION      OB History     Gravida  4   Para  4    Term  4   Preterm  0   AB  0   Living  4      SAB  0   IAB  0   Ectopic  0   Multiple  0   Live Births           Obstetric Comments  Delivered 4 large babies vaginally          Home Medications    Prior to Admission medications  Medication Sig Start Date End Date Taking? Authorizing Provider  diclofenac  Sodium (VOLTAREN ) 1 % GEL Apply 2 g topically 3 (three) times daily. 12/20/24  Yes Rodriguez-Southworth, Clois Montavon, PA-C  traMADol  (ULTRAM ) 50 MG tablet Take 1 tablet (50 mg total) by mouth 3 (three) times daily. 12/20/24  Yes Rodriguez-Southworth, Barnaby Rippeon, PA-C  triamcinolone cream (KENALOG) 0.1 % Apply 1 Application topically 2 (two) times daily. 11/05/24  Yes [provider]  Accu-Chek Softclix Lancets lancets daily. 05/02/24   [provider]  Acetaminophen  Extra Strength 500 MG TABS Take 1 tablet by mouth 3 (three) times daily as needed. 06/29/23   [provider]  albuterol  (PROVENTIL  HFA;VENTOLIN  HFA) 108 (90 BASE) MCG/ACT inhaler Inhale 1-2 puffs into the lungs every 6 (six) hours as needed for wheezing. 07/29/13   Sonda Alm BROCKS, MD  aspirin  EC 81 MG tablet Take 81 mg by mouth daily.    [provider]  atorvastatin  (LIPITOR) 40 MG tablet Take 40 mg by mouth daily.    [provider]  Blood Glucose Monitoring Suppl (ACCU-CHEK GUIDE) w/Device KIT daily. 05/02/24   [provider]  Cholecalciferol  (VITAMIN D3) 1000 units CAPS Take 1 capsule by mouth daily.    [provider]  fluticasone  (FLONASE ) 50 MCG/ACT nasal spray Place 2 sprays into both nostrils as needed for allergies. 04/09/22   [provider]  glimepiride (AMARYL) 2 MG tablet Take 2 mg by mouth daily. 03/17/22   [provider]  glucose blood (PRECISION QID TEST) test strip 1 each by Other route as needed. 07/07/21   [provider]  hydrochlorothiazide  (HYDRODIURIL ) 12.5 MG tablet Take 12.5 mg by mouth every morning.     [provider]  linagliptin (TRADJENTA) 5 MG TABS tablet Take 5 mg by mouth daily.    [provider]  lisinopril  (ZESTRIL ) 40 MG tablet Take 1 tablet (40 mg total) by mouth daily. 05/10/23   Conte, Tessa N, PA-C  metoprolol  succinate (TOPROL -XL) 50 MG 24 hr tablet TAKE 1 TABLET BY MOUTH DAILY WITH OR IMMEDIATELY FOLLOWING A MEAL Patient taking differently: Take 50 mg by mouth daily. 04/13/22   Swinyer, Rosaline HERO, NP  omeprazole (PRILOSEC) 40 MG capsule Take 40 mg by mouth daily. 08/03/23  [provider]    Family History Family History  Problem Relation Age of Onset   Heart disease Father    Valvular heart disease Father        aortic valve replacement   Pancreatitis Mother    Diabetes Maternal Grandmother    Heart disease Brother    Valvular heart disease Brother        aortic valve replacement   CAD Son        heart attack   Kidney disease Son     Social History Social History[1]   Allergies   Lidocaine , Adhesive [tape], Codeine, and Empagliflozin   Review of Systems Review of Systems As noted in HPI  Physical Exam Triage Vital Signs ED Triage Vitals [12/20/24 1224]  Encounter Vitals Group     BP (!) 164/100     Girls Systolic BP Percentile      Girls Diastolic BP Percentile      Boys Systolic BP Percentile      Boys Diastolic BP Percentile      Pulse Rate 64     Resp 16     Temp 97.6 F (36.4 C)     Temp Source Oral     SpO2 97 %     Weight      Height      Head Circumference      Peak Flow      Pain Score 10     Pain Loc      Pain Education      Exclude from Growth Chart    No data found.  Updated Vital Signs BP 124/83 (BP Location: Left Arm)   Pulse 64   Temp 97.6 F (36.4 C) (Oral)   Resp 16   SpO2 97%   Visual Acuity Right Eye Distance:   Left Eye Distance:   Bilateral Distance:    Right Eye Near:   Left Eye Near:    Bilateral Near:     Physical Exam Vitals and nursing note reviewed.  Constitutional:       Comments: In moderate pain when she moves her wrist  HENT:     Nose: Nose normal.  Eyes:     General: No scleral icterus.    Conjunctiva/sclera: Conjunctivae normal.  Cardiovascular:     Pulses: Normal pulses.  Pulmonary:     Effort: Pulmonary effort is normal.  Musculoskeletal:        General: Swelling and tenderness present.     Cervical back: Neck supple.     Comments: R HAND- normal with no tenderness R WRIST- with moderate tenderness on radial aspect and some soft tissue edema present. Had limited ROM due to pain.  R ELBOW- no swelling, redness or ecchymosis. I was not able to reproduce any pain with palpation.   Skin:    General: Skin is warm and dry.     Findings: No bruising, erythema or rash.  Neurological:     Mental Status: She is alert and oriented to person, place, and time.     Sensory: No sensory deficit.     Gait: Gait normal.  Psychiatric:        Mood and Affect: Mood normal.        Behavior: Behavior normal.        Thought Content: Thought content normal.        Judgment: Judgment normal.      UC Treatments / Results  Labs (all labs ordered are listed, but only  abnormal results are displayed) Labs Reviewed - No data to display  EKG   Radiology DG Wrist Complete Right Result Date: 12/20/2024 CLINICAL DATA:  Atraumatic right wrist pain. EXAM: RIGHT WRIST - COMPLETE 3+ VIEW COMPARISON:  None Available. FINDINGS: There is no evidence of acute fracture or dislocation. Mild to moderate severity diffuse degenerative changes are noted throughout the right wrist and first carpometacarpal joint. Curvilinear calcification is seen along the radiocarpal and ulnocarpal joints. There is mild to moderate severity diffuse soft tissue swelling. IMPRESSION: 1. Mild to moderate severity diffuse degenerative changes throughout the right wrist and first carpometacarpal joint. 2. 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 Electronically Signed    By: Suzen Dials M.D.   On: 12/20/2024 13:18    Procedures Procedures (including critical care time)  Medications Ordered in UC Medications - No data to display  Initial Impression / Assessment and Plan / UC Course  I have reviewed the triage vital signs and the nursing notes.  Pertinent  imaging results that were available during my care of the patient were reviewed by me and considered in my medical decision making (see chart for details). Xray shows OA and changes consistent with pseudogout.   I checked her prior labs from 12/2023 which is the last ones on record and showed poor DM control and renal insufficiency. Due to this I did not feel comfortable prescribing prednisone  or Colcrys. I placed her on a wrist splint and prescribed her Voltaren  gel and Tramadol  as noted. Needs to FU with PCP next week.     Final Clinical Impressions(s) / UC Diagnoses   Final diagnoses:  Wrist pain, acute, right  Pseudogout of wrist, right  Primary osteoarthritis of right wrist     Discharge Instructions      Your xray shows you have arthritis in your wrist with a lot of inflammation.  I have sent medication to help this pain and inflammation.  Please follow up with your primary care doctor next week.  Wear the brace to give you comfort for 5 days.      ED Prescriptions     Medication Sig Dispense Auth. Provider   traMADol  (ULTRAM ) 50 MG tablet Take 1 tablet (50 mg total) by mouth 3 (three) times daily. 10 tablet Rodriguez-Southworth, Kyra, PA-C   diclofenac  Sodium (VOLTAREN ) 1 % GEL Apply 2 g topically 3 (three) times daily. 20 g Rodriguez-Southworth, Kyra, PA-C      I have reviewed the PDMP during this encounter.     [1]  Social History Tobacco Use   Smoking status: Never   Smokeless tobacco: Never  Vaping Use   Vaping status: Never Used  Substance Use Topics   Alcohol use: No   Drug use: Never     Lindi Kyra, PA-C 12/20/24 1412  "

## 2024-12-20 NOTE — Discharge Instructions (Addendum)
 Your xray shows you have arthritis in your wrist with a lot of inflammation.  I have sent medication to help this pain and inflammation.  Please follow up with your primary care doctor next week.  Wear the brace to give you comfort for 5 days.

## 2024-12-20 NOTE — ED Triage Notes (Signed)
 Patient here today with c/o right wrist pain since Monday. No known injury. Patient has tried heat and ice with no relief. Patient has taken Tylenol  with some relief but the pain returns.

## 2025-01-02 ENCOUNTER — Ambulatory Visit: Admitting: Physician Assistant
